# Patient Record
Sex: Male | Born: 1945 | Race: Black or African American | Hispanic: No | Marital: Married | Smoking: Former smoker
Health system: Southern US, Community
[De-identification: ages and names within clinical notes are randomized; demographics above are authoritative.]

## PROBLEM LIST (undated history)

## (undated) DIAGNOSIS — N179 Acute kidney failure, unspecified: Secondary | ICD-10-CM

## (undated) DIAGNOSIS — E559 Vitamin D deficiency, unspecified: Secondary | ICD-10-CM

## (undated) DIAGNOSIS — I10 Essential (primary) hypertension: Secondary | ICD-10-CM

## (undated) DIAGNOSIS — I50810 Right heart failure, unspecified: Secondary | ICD-10-CM

## (undated) HISTORY — PX: PROSTATE SURGERY: SHX751

## (undated) HISTORY — PX: SPINE SURGERY: SHX786

---

## 2014-10-16 ENCOUNTER — Emergency Department (HOSPITAL_COMMUNITY): Payer: Medicare Other

## 2014-10-16 ENCOUNTER — Encounter (HOSPITAL_COMMUNITY): Payer: Self-pay | Admitting: *Deleted

## 2014-10-16 ENCOUNTER — Inpatient Hospital Stay (HOSPITAL_COMMUNITY)
Admission: EM | Admit: 2014-10-16 | Discharge: 2014-10-23 | DRG: 175 | Disposition: A | Discharge status: Home / Self Care | Payer: Medicare Other | Attending: Internal Medicine | Admitting: Internal Medicine

## 2014-10-16 DIAGNOSIS — J029 Acute pharyngitis, unspecified: Secondary | ICD-10-CM | POA: Diagnosis present

## 2014-10-16 DIAGNOSIS — R0600 Dyspnea, unspecified: Secondary | ICD-10-CM

## 2014-10-16 DIAGNOSIS — Z87891 Personal history of nicotine dependence: Secondary | ICD-10-CM

## 2014-10-16 DIAGNOSIS — N179 Acute kidney failure, unspecified: Secondary | ICD-10-CM | POA: Diagnosis present

## 2014-10-16 DIAGNOSIS — J9601 Acute respiratory failure with hypoxia: Secondary | ICD-10-CM | POA: Diagnosis present

## 2014-10-16 DIAGNOSIS — Y95 Nosocomial condition: Secondary | ICD-10-CM | POA: Diagnosis not present

## 2014-10-16 DIAGNOSIS — I82431 Acute embolism and thrombosis of right popliteal vein: Secondary | ICD-10-CM | POA: Diagnosis present

## 2014-10-16 DIAGNOSIS — I82401 Acute embolism and thrombosis of unspecified deep veins of right lower extremity: Secondary | ICD-10-CM | POA: Diagnosis present

## 2014-10-16 DIAGNOSIS — Z9079 Acquired absence of other genital organ(s): Secondary | ICD-10-CM | POA: Diagnosis present

## 2014-10-16 DIAGNOSIS — E876 Hypokalemia: Secondary | ICD-10-CM | POA: Diagnosis present

## 2014-10-16 DIAGNOSIS — I2601 Septic pulmonary embolism with acute cor pulmonale: Secondary | ICD-10-CM | POA: Diagnosis present

## 2014-10-16 DIAGNOSIS — E871 Hypo-osmolality and hyponatremia: Secondary | ICD-10-CM | POA: Diagnosis present

## 2014-10-16 DIAGNOSIS — I10 Essential (primary) hypertension: Secondary | ICD-10-CM | POA: Diagnosis present

## 2014-10-16 DIAGNOSIS — I129 Hypertensive chronic kidney disease with stage 1 through stage 4 chronic kidney disease, or unspecified chronic kidney disease: Secondary | ICD-10-CM | POA: Diagnosis present

## 2014-10-16 DIAGNOSIS — J181 Lobar pneumonia, unspecified organism: Secondary | ICD-10-CM | POA: Diagnosis not present

## 2014-10-16 DIAGNOSIS — I248 Other forms of acute ischemic heart disease: Secondary | ICD-10-CM | POA: Diagnosis present

## 2014-10-16 DIAGNOSIS — E860 Dehydration: Secondary | ICD-10-CM | POA: Diagnosis present

## 2014-10-16 DIAGNOSIS — I2699 Other pulmonary embolism without acute cor pulmonale: Principal | ICD-10-CM | POA: Diagnosis present

## 2014-10-16 DIAGNOSIS — R000 Tachycardia, unspecified: Secondary | ICD-10-CM | POA: Insufficient documentation

## 2014-10-16 DIAGNOSIS — J069 Acute upper respiratory infection, unspecified: Secondary | ICD-10-CM | POA: Diagnosis present

## 2014-10-16 DIAGNOSIS — D62 Acute posthemorrhagic anemia: Secondary | ICD-10-CM | POA: Diagnosis present

## 2014-10-16 DIAGNOSIS — N189 Chronic kidney disease, unspecified: Secondary | ICD-10-CM | POA: Diagnosis present

## 2014-10-16 DIAGNOSIS — A419 Sepsis, unspecified organism: Secondary | ICD-10-CM | POA: Diagnosis not present

## 2014-10-16 DIAGNOSIS — R778 Other specified abnormalities of plasma proteins: Secondary | ICD-10-CM

## 2014-10-16 DIAGNOSIS — E559 Vitamin D deficiency, unspecified: Secondary | ICD-10-CM | POA: Insufficient documentation

## 2014-10-16 DIAGNOSIS — J189 Pneumonia, unspecified organism: Secondary | ICD-10-CM | POA: Clinically undetermined

## 2014-10-16 DIAGNOSIS — R079 Chest pain, unspecified: Secondary | ICD-10-CM

## 2014-10-16 DIAGNOSIS — I2782 Chronic pulmonary embolism: Secondary | ICD-10-CM | POA: Diagnosis present

## 2014-10-16 DIAGNOSIS — R7989 Other specified abnormal findings of blood chemistry: Secondary | ICD-10-CM | POA: Diagnosis present

## 2014-10-16 HISTORY — DX: Vitamin D deficiency, unspecified: E55.9

## 2014-10-16 HISTORY — DX: Essential (primary) hypertension: I10

## 2014-10-16 LAB — TROPONIN I
TROPONIN I: 0.16 ng/mL — AB (ref <0.031)
Troponin I: 0.16 ng/mL — ABNORMAL HIGH (ref <0.031)

## 2014-10-16 LAB — D-DIMER, QUANTITATIVE: D-Dimer, Quant: >20 ug/mL-FEU — ABNORMAL HIGH (ref 0.00–0.48)

## 2014-10-16 LAB — BASIC METABOLIC PANEL
Anion gap: 12 (ref 5–15)
BUN: 16 mg/dL (ref 6–23)
CALCIUM: 10.1 mg/dL (ref 8.4–10.5)
CO2: 27 mmol/L (ref 19–32)
CREATININE: 1.68 mg/dL — AB (ref 0.50–1.35)
Chloride: 98 mmol/L (ref 96–112)
GFR calc non Af Amer: 40 mL/min — ABNORMAL LOW (ref >90)
GFR, EST AFRICAN AMERICAN: 47 mL/min — AB (ref >90)
Glucose, Bld: 122 mg/dL — ABNORMAL HIGH (ref 70–99)
Potassium: 3.5 mmol/L (ref 3.5–5.1)
Sodium: 137 mmol/L (ref 135–145)

## 2014-10-16 LAB — CBC WITH DIFFERENTIAL/PLATELET
BASOS ABS: 0 10*3/uL (ref 0.0–0.1)
Basophils Relative: 0 % (ref 0–1)
EOS ABS: 0.2 10*3/uL (ref 0.0–0.7)
EOS PCT: 1 % (ref 0–5)
HCT: 47.1 % (ref 39.0–52.0)
Hemoglobin: 16.4 g/dL (ref 13.0–17.0)
Lymphocytes Relative: 15 % (ref 12–46)
Lymphs Abs: 2.3 10*3/uL (ref 0.7–4.0)
MCH: 30.6 pg (ref 26.0–34.0)
MCHC: 34.8 g/dL (ref 30.0–36.0)
MCV: 87.9 fL (ref 78.0–100.0)
Monocytes Absolute: 1.3 10*3/uL — ABNORMAL HIGH (ref 0.1–1.0)
Monocytes Relative: 9 % (ref 3–12)
Neutro Abs: 11 10*3/uL — ABNORMAL HIGH (ref 1.7–7.7)
Neutrophils Relative %: 75 % (ref 43–77)
Platelets: 273 10*3/uL (ref 150–400)
RBC: 5.36 MIL/uL (ref 4.22–5.81)
RDW: 14.5 % (ref 11.5–15.5)
WBC: 14.8 10*3/uL — ABNORMAL HIGH (ref 4.0–10.5)

## 2014-10-16 LAB — I-STAT TROPONIN, ED: TROPONIN I, POC: 0.14 ng/mL — AB (ref 0.00–0.08)

## 2014-10-16 LAB — BRAIN NATRIURETIC PEPTIDE: B Natriuretic Peptide: 57.9 pg/mL (ref 0.0–100.0)

## 2014-10-16 MED ORDER — IPRATROPIUM-ALBUTEROL 0.5-2.5 (3) MG/3ML IN SOLN
3.0000 mL | RESPIRATORY_TRACT | Status: DC
  Administered 2014-10-16 – 2014-10-17 (×5): 3 mL via RESPIRATORY_TRACT
  Filled 2014-10-16 (×5): qty 3

## 2014-10-16 MED ORDER — HEPARIN (PORCINE) IN NACL 100-0.45 UNIT/ML-% IJ SOLN
800.0000 [IU]/h | INTRAMUSCULAR | Status: DC
  Administered 2014-10-16: 800 [IU]/h via INTRAVENOUS
  Filled 2014-10-16: qty 250

## 2014-10-16 MED ORDER — HEPARIN (PORCINE) IN NACL 100-0.45 UNIT/ML-% IJ SOLN
1150.0000 [IU]/h | INTRAMUSCULAR | Status: DC
  Administered 2014-10-16: 1150 [IU]/h via INTRAVENOUS

## 2014-10-16 MED ORDER — ASPIRIN 325 MG PO TABS
325.0000 mg | ORAL_TABLET | Freq: Once | ORAL | Status: AC
  Administered 2014-10-16: 325 mg via ORAL
  Filled 2014-10-16: qty 1

## 2014-10-16 MED ORDER — ASPIRIN 81 MG PO CHEW: 81.0000 mg | CHEWABLE_TABLET | Freq: Every day | ORAL | Status: DC

## 2014-10-16 MED ORDER — SODIUM CHLORIDE 0.9 % IV BOLUS (SEPSIS)
1000.0000 mL | Freq: Once | INTRAVENOUS | Status: AC
  Administered 2014-10-16: 1000 mL via INTRAVENOUS

## 2014-10-16 MED ORDER — IOHEXOL 350 MG/ML SOLN
100.0000 mL | Freq: Once | INTRAVENOUS | Status: AC | PRN
  Administered 2014-10-16: 100 mL via INTRAVENOUS

## 2014-10-16 MED ORDER — HEPARIN BOLUS VIA INFUSION
4000.0000 [IU] | INTRAVENOUS | Status: AC
  Administered 2014-10-16: 4000 [IU] via INTRAVENOUS

## 2014-10-16 NOTE — Progress Notes (Addendum)
ANTICOAGULATION CONSULT NOTE - Initial Consult  Pharmacy Consult for heparin Indication: PE/ chest pain/ACS  No Known Allergies  Patient Measurements:   Heparin Dosing Weight: 67kg  Vital Signs: Temp: 99.7 F (37.6 C) (03/01 1716) Temp Source: Oral (03/01 1716) BP: 131/82 mmHg (03/01 1716) Pulse Rate: 103 (03/01 1716)  Labs:  Recent Labs  10/16/14 1424 10/16/14 1427 10/16/14 1558  HGB  --   --  16.4  HCT  --   --  47.1  PLT  --   --  273  CREATININE  --  1.68*  --   TROPONINI 0.16*  --   --     CrCl cannot be calculated (Unknown ideal weight.).   Medical History: Past Medical History  Diagnosis Date  . Hypertension   . Vitamin D deficiency    Assessment: [age] YOM presents with chest pain and shortness of breath,  Cardiac enzymes mildly elevated.  Chest pain is atypical as it was reproducible was cough and palpation.  EKG w/o ischemic changes.  D-dime > 20.  Cardiology has decided to start heparin gtt while ACS is ruled out.    Today, 10/16/2014  CBC: WNL  Renal - SCR is elevated at 1.68 (no documented h/o CKD)  D-dimer >20  Goal of Therapy:  Heparin level 0.3-0.7 units/ml Monitor platelets by anticoagulation protocol: Yes   Plan:   Heparin 4000 units x 1 bolus then 800 units/hr  Check heparin level 6h after heparin started  Daily heparin level and CBC  Monitor for bleeding  Juliette Alcideustin Zeigler, PharmD, BCPS.   Pager: [phone]  10/16/2014,5:41 PM  Addendum:  CT angiogram performed and reveals submassive PE with evidence of right heart strain  Plan:  Increase heparin gtt to 1150 units/hr based on CTA findings  Check heparin level 6h after rate change  Juliette Alcideustin Zeigler, PharmD, BCPS.   Pager: [phone] 10/16/2014 7:55 PM

## 2014-10-16 NOTE — ED Provider Notes (Signed)
CSN: [id number]     Arrival date & time 10/16/14  1250 History   First MD Initiated Contact with Patient 10/16/14 1341     Chief Complaint  Patient presents with  . Shortness of Breath  . Cough     (Consider location/radiation/quality/duration/timing/severity/associated sxs/prior Treatment) HPI Bryan Parker is a 69 y.o. male with a history of hypertension comes in for evaluation of cough, shortness of breath. Patient states for the past 3 days he has had increased cough, sore throat, myalgias, shortness of breath and a burning in his chest when he coughs. He has tried Alka-Seltzer cold and flu, cough drops which were initially effective but have since lost their potency. Rates his discomfort as 5/10. He denies fevers at home, nausea or vomiting, abdominal pain, diarrhea or constipation, numbness or weakness, syncope, rash Family history of cardiac disease unknown. Former smoker half pack a day for past 40 yrs, quit 6 years ago  Past Medical History  Diagnosis Date  . Hypertension    Past Surgical History  Procedure Laterality Date  . Spine surgery    . Prostate surgery     No family history on file. History  Substance Use Topics  . Smoking status: Former Games developer  . Smokeless tobacco: Not on file  . Alcohol Use: No    Review of Systems A 10 point review of systems was completed and was negative except for pertinent positives and negatives as mentioned in the history of present illness     Allergies  Review of patient's allergies indicates no known allergies.  Home Medications   Prior to Admission medications   Medication Sig Start Date End Date Taking? Authorizing Provider  DiphenhydrAMINE HCl (ALKA-SELTZER PLUS ALLERGY PO) Take 2 each by mouth daily as needed (cold symptoms). Dissolve 2 tablets in water   Yes Historical Provider, MD  hydrochlorothiazide (HYDRODIURIL) 12.5 MG tablet Take 12.5 mg by mouth daily.   Yes Historical Provider, MD  naproxen sodium (ANAPROX) 220  MG tablet Take 440 mg by mouth at bedtime as needed (for wrist pain).   Yes Historical Provider, MD  polyethylene glycol (MIRALAX / GLYCOLAX) packet Take 17 g by mouth daily.   Yes Historical Provider, MD  Vitamin D, Ergocalciferol, (DRISDOL) 50000 UNITS CAPS capsule Take 50,000 Units by mouth every 7 (seven) days.   Yes Historical Provider, MD   BP 144/91 mmHg  Pulse 105  Temp(Src) 98.6 F (37 C) (Oral)  Resp 27  SpO2 100% Physical Exam  Constitutional: He is oriented to person, place, and time. He appears well-developed and well-nourished.  HENT:  Head: Normocephalic and atraumatic.  Mouth/Throat: Oropharynx is clear and moist.  Eyes: Conjunctivae are normal. Pupils are equal, round, and reactive to light. Right eye exhibits no discharge. Left eye exhibits no discharge. No scleral icterus.  Neck: Neck supple.  Cardiovascular: Normal rate, regular rhythm and normal heart sounds.   Pulmonary/Chest: Effort normal and breath sounds normal. No respiratory distress. He has no wheezes. He has no rales.  Abdominal: Soft. There is no tenderness.  Musculoskeletal: He exhibits no edema or tenderness.  Neurological: He is alert and oriented to person, place, and time.  Cranial Nerves II-XII grossly intact  Skin: Skin is warm and dry. No rash noted.  Psychiatric: He has a normal mood and affect.  Nursing note and vitals reviewed.   ED Course  Procedures (including critical care time) Labs Review Labs Reviewed  BASIC METABOLIC PANEL - Abnormal; Notable for the following:  Glucose, Bld 122 (*)    Creatinine, Ser 1.68 (*)    GFR calc non Af Amer 40 (*)    GFR calc Af Amer 47 (*)    All other components within normal limits  TROPONIN I - Abnormal; Notable for the following:    Troponin I 0.16 (*)    All other components within normal limits  CBC WITH DIFFERENTIAL/PLATELET - Abnormal; Notable for the following:    WBC 14.8 (*)    Neutro Abs 11.0 (*)    Monocytes Absolute 1.3 (*)    All  other components within normal limits  I-STAT TROPOININ, ED - Abnormal; Notable for the following:    Troponin i, poc 0.14 (*)    All other components within normal limits  BRAIN NATRIURETIC PEPTIDE  D-DIMER, QUANTITATIVE    Imaging Review Dg Chest 2 View  10/16/2014   CLINICAL DATA:  Cough and difficulty breathing for 2 days  EXAM: CHEST  2 VIEW  COMPARISON:  None.  FINDINGS: There is no edema or consolidation. The heart size and pulmonary vascularity are normal. No adenopathy. There is postoperative change in the lower cervical spine.  IMPRESSION: No edema or consolidation.   Electronically Signed   By: Bretta BangWilliam  Woodruff III M.D.   On: 10/16/2014 14:57     EKG Interpretation   Date/Time:  Tuesday October 16 2014 14:01:57 EST Ventricular Rate:  97 PR Interval:  192 QRS Duration: 72 QT Interval:  345 QTC Calculation: 438 R Axis:   84 Text Interpretation:  Sinus rhythm Right atrial enlargement Anteroseptal  infarct, age indeterminate No prior for comparison Confirmed by DOCHERTY   MD, MEGAN [phone]([id number]) on 10/16/2014 2:04:36 PM     Meds given in ED:  Medications  ipratropium-albuterol (DUONEB) 0.5-2.5 (3) MG/3ML nebulizer solution 3 mL (3 mLs Nebulization Given 10/16/14 1448)  aspirin tablet 325 mg (325 mg Oral Given 10/16/14 1556)    New Prescriptions   No medications on file   Filed Vitals:   10/16/14 1312 10/16/14 1446 10/16/14 1630  BP: 141/94 132/87 144/91  Pulse: 119 100 105  Temp: 98.5 F (36.9 C) 98.6 F (37 C) 98.6 F (37 C)  TempSrc: Oral Oral Oral  Resp: 18 26 27   SpO2: 97% 90% 100%    MDM  Vitals stable - mild tachycardia and tachypnea, afebrile Pt resting comfortably in ED. Patient denies any chest pain at this time. PE--benign lung exam otherwise noncontributory Labwork--initial i-STAT troponin 0.14, regular troponin 0.16, EKG without any ischemic changes. Imaging-chest x-ray shows no edema or consolidation.  Discussed patient presentation and ED course with  attending, Dr. Micheline Mazeocherty. Decision made to consult cardiology for further evaluation of elevated troponin. Consult cardiology and they will consult on patient for elevated troponins.  Care signed out to [facility]issa Sciacca, PA-C. Plan is if the d-dimer positive then CTA for PE rule out. If PE positive, admission to medicine floor where cardiology will consult. If negative, admission to cardiology for serial troponins.    Final diagnoses:  None        Sharlene MottsBenjamin W Bryceson Grape, PA-C 10/16/14 1721  Toy CookeyMegan Docherty, MD 10/17/14 [phone]

## 2014-10-16 NOTE — ED Notes (Signed)
Pt not in ROOM at present time; in triage.

## 2014-10-16 NOTE — ED Notes (Signed)
Pt to CT at this time.

## 2014-10-16 NOTE — H&P (Signed)
[facility] History and Physical  Bryan Parker ZOX:[id number] DOB: November 11, 1945 DOA: 10/16/2014  Referring physician: EDP PCP: Loman Brooklyn, MD   Chief Complaint: chest pain  HPI: Bryan Parker is a 69 y.o. male  Her past medical history of hypertension  And previous tobacco use presents to the ER with the above complaints. Patient reports cough congestion runny nose and shortness of breath for the last 3-4 days.  He also noticed severe pain and hit his chest when he coughs.  No fevers or chills no dizziness no lower extremity edema.  In the ER he was noted to have mildly elevated troponin,  Significantly elevated d-dimer and CTA positive for acute PE   Review of Systems:  Constitutional:  No weight loss, night sweats, Fevers, chills, fatigue.  HEENT:  No headaches, Difficulty swallowing,Tooth/dental problems,Sore throat,  No sneezing, itching, ear ache, nasal congestion, post nasal drip,  Cardio-vascular:  No chest pain, Orthopnea, PND, swelling in lower extremities, anasarca, dizziness, palpitations  GI:  No heartburn, indigestion, abdominal pain, nausea, vomiting, diarrhea, change in bowel habits, loss of appetite  Resp:  No shortness of breath with exertion or at rest. No excess mucus, no productive cough, No non-productive cough, No coughing up of blood.No change in color of mucus.No wheezing.No chest wall deformity  Skin:  no rash or lesions.  GU:  no dysuria, change in color of urine, no urgency or frequency. No flank pain.  Musculoskeletal:  No joint pain or swelling. No decreased range of motion. No back pain.  Psych:  No change in mood or affect. No depression or anxiety. No memory loss.   Past Medical History  Diagnosis Date  . Hypertension   . Vitamin D deficiency    Past Surgical History  Procedure Laterality Date  . Spine surgery    . Prostate surgery     Social History:  reports that he has quit smoking. He does not have any smokeless tobacco history  on file. He reports that he does not drink alcohol or use illicit drugs.  No Known Allergies  Family History  Problem Relation Age of Onset  . Deep vein thrombosis Sister   . Pulmonary embolism Sister   . Heart attack Brother 50  . Heart disease Brother     Prior to Admission medications   Medication Sig Start Date End Date Taking? Authorizing Provider  DiphenhydrAMINE HCl (ALKA-SELTZER PLUS ALLERGY PO) Take 2 each by mouth daily as needed (cold symptoms). Dissolve 2 tablets in water   Yes Historical Provider, MD  hydrochlorothiazide (HYDRODIURIL) 12.5 MG tablet Take 12.5 mg by mouth daily.   Yes Historical Provider, MD  naproxen sodium (ANAPROX) 220 MG tablet Take 440 mg by mouth at bedtime as needed (for wrist pain).   Yes Historical Provider, MD  polyethylene glycol (MIRALAX / GLYCOLAX) packet Take 17 g by mouth daily.   Yes Historical Provider, MD  Vitamin D, Ergocalciferol, (DRISDOL) 50000 UNITS CAPS capsule Take 50,000 Units by mouth every 7 (seven) days.   Yes Historical Provider, MD   Physical Exam: Filed Vitals:   10/16/14 1750 10/16/14 1848 10/16/14 1900 10/16/14 1930  BP:  127/83 139/81 151/82  Pulse:  95 96 100  Temp:  99.1 F (37.3 C)    TempSrc:  Oral    Resp:  Height:      Weight:      SpO2: 94% 99% 99% 99%    Wt Readings from Last 3 Encounters:  10/16/14  66.679 kg (147 lb)    General:  Appears calm and comfortable,  No distress Eyes: PERRL, normal lids, irises & conjunctiva ENT: grossly normal hearing, lips & tongue Neck: no LAD, masses or thyromegaly Cardiovascular: RRR, no m/r/g. No LE edema. Telemetry: SR, no arrhythmias  Respiratory:  Scattered Rales at the bases Abdomen: soft, ntnd Skin: no rash or induration seen on limited exam Musculoskeletal: grossly normal tone BUE/BLE Psychiatric: grossly normal mood and affect, speech fluent and appropriate Neurologic: grossly non-focal.          Labs on Admission:  Basic Metabolic  Panel:  Recent Labs Lab 10/16/14 1427  NA 137  K 3.5  CL 98  CO2 27  GLUCOSE 122*  BUN 16  CREATININE 1.68*  CALCIUM 10.1   Liver Function Tests: No results for input(s): AST, ALT, ALKPHOS, BILITOT, PROT, ALBUMIN in the last 168 hours. No results for input(s): LIPASE, AMYLASE in the last 168 hours. No results for input(s): AMMONIA in the last 168 hours. CBC:  Recent Labs Lab 10/16/14 1558  WBC 14.8*  NEUTROABS 11.0*  HGB 16.4  HCT 47.1  MCV 87.9  PLT 273   Cardiac Enzymes:  Recent Labs Lab 10/16/14 1424 10/16/14 1825  TROPONINI 0.16* 0.16*    BNP (last 3 results)  Recent Labs  10/16/14 1423  BNP 57.9    ProBNP (last 3 results) No results for input(s): PROBNP in the last 8760 hours.  CBG: No results for input(s): GLUCAP in the last 168 hours.  Radiological Exams on Admission: Dg Chest 2 View  10/16/2014   CLINICAL DATA:  Cough and difficulty breathing for 2 days  EXAM: CHEST  2 VIEW  COMPARISON:  None.  FINDINGS: There is no edema or consolidation. The heart size and pulmonary vascularity are normal. No adenopathy. There is postoperative change in the lower cervical spine.  IMPRESSION: No edema or consolidation.   Electronically Signed   By: Bretta Bang III M.D.   On: 10/16/2014 14:57   Ct Angio Chest Pe W/cm &/or Wo Cm  10/16/2014   CLINICAL DATA:  Two day history of chest pain and difficulty breathing  EXAM: CT ANGIOGRAPHY CHEST WITH CONTRAST  TECHNIQUE: Multidetector CT imaging of the chest was performed using the standard protocol during bolus administration of intravenous contrast. Multiplanar CT image reconstructions and MIPs were obtained to evaluate the vascular anatomy.  CONTRAST:  OMNIPAQUE IOHEXOL 350 MG/ML SOLN  COMPARISON:  Chest radiograph October 16, 2014  FINDINGS: There is pulmonary embolism arising from the distal right main pulmonary artery with extension into proximal upper and lower lobe pulmonary arteries. On the left, there is  pulmonary embolus arising at the origins of the upper and lower lobe pulmonary arteries on the left. There is right heart strain with a right ventricle to left ventricle diameter ratio of 1.4, normal less than 0.9. There is no thoracic aortic aneurysm or dissection.  There is underlying centrilobular emphysema. There is mild atelectasis in each lung base. There is no edema or consolidation. There is mild lower lobe bronchiectatic change bilaterally.  There is no appreciable thoracic adenopathy. The pericardium is not thickened.  In the visualized upper abdomen, no lesions are identified.  There are no blastic or lytic bone lesions. Thyroid appears unremarkable.  Review of the MIP images confirms the above findings.  IMPRESSION: Positive for acute PE with CT evidence of right heart strain (RV/LV Ratio = 1.4) consistent with at least submassive (intermediate risk) PE. The presence of  right heart strain has been associated with an increased risk of morbidity and mortality.  There is a degree of underlying centrilobular emphysema. Mild bibasilar atelectasis. No edema or consolidation. No adenopathy.  Critical Value/emergent results were called by telephone at the time of interpretation on 10/16/2014 at 6:47 pm to [facility] CenterMARISSA SCIACCA, PA , who verbally acknowledged these results.   Electronically Signed   By: Bretta BangWilliam  Woodruff III M.D.   On: 10/16/2014 18:47    EKG: Independently reviewed. NSR, possible old anteroseptal infarct  Assessment/Plan Active Problems:   Acute PE (pulmonary embolism) -with Ct evidence of R heart strain, hemodynamically stable at this time-no acute indication for TPA - IV heparin,  Can be transitioned to NOACs  In 24-48   Hours if stable -check 2d ECHO  To evaluate for right  ventricle, check lower extremity venous duplex - check hypercoagulable panel - up-to-date on age appropriate malignancy screening,  Normal colonoscopy a few years ago and history of prostatectomy    Elevated  troponin - Suspect demand ischemia from acute PE - cycle troponin, check 2-D echo for wall motion    HTN (hypertension) - Stable hold HCTZ  Code Status: Full Code DVT Prophylaxis: on IV heparin Family Communication: none at bedside Disposition Plan: stepdown  Time spent: 60min  [facility] CenterJOSEPH,Naria Abbey [facility] Pager [phone]

## 2014-10-16 NOTE — ED Notes (Signed)
Tori attempt IV unsuccessful once. [facility] Will attempt IV and blood draw with pt return.

## 2014-10-16 NOTE — Progress Notes (Signed)
EDCM spoke to patient at bedside.  Patient [place]  System updated.

## 2014-10-16 NOTE — Consult Note (Signed)
Admit date: 10/16/2014 Referring Physician  Dr. Littie Deeds Primary Cardiologist  none Reason for Consultation  Chest pain with elevated trop  HPI: This is a 69yo AAM with a history of HTN and family history of premature CAD (brother died of MI in his 48's) and remote tobacco use who presented to the ER with complaints of left sided CP and SOB.  He says that he has had subjective chills, cough and SOB for the past 3 days with sore throat, myalgias and burning in his chest when he coughs.  He also has a tightness in his left breast that is worse when lying on his left side and with rubbing his chest.  He denies and N/V/LE edema/weakness or dizziness.  He has a remote history of tobacco use smoking 1/2ppd for 40 years and quit 6 years ago.  In ER he was noted to have a mildly elevated Trop and Cardiology is now asked to consult.  His WBC is elevated with normal chest xray and EKG is nonischemic.     PMH:   Past Medical History  Diagnosis Date  . Hypertension   . Vitamin D deficiency      PSH:   Past Surgical History  Procedure Laterality Date  . Spine surgery    . Prostate surgery      Allergies:  Review of patient's allergies indicates no known allergies. Prior to Admit Meds:   (Not in a hospital admission) Fam HX:    Family History  Problem Relation Age of Onset  . Deep vein thrombosis Sister   . Pulmonary embolism Sister   . Heart attack Brother 50  . Heart disease Brother    Social HX:    History   Social History  . Marital Status: Married    Spouse Name: N/A  . Number of Children: N/A  . Years of Education: N/A   Occupational History  . Not on file.   Social History Main Topics  . Smoking status: Former Games developer  . Smokeless tobacco: Not on file  . Alcohol Use: No  . Drug Use: No  . Sexual Activity: Not on file   Other Topics Concern  . Not on file   Social History Narrative  . No narrative on file     ROS:  All 11 ROS were addressed and are negative except  what is stated in the HPI  Physical Exam: Blood pressure 131/82, pulse 103, temperature 99.7 F (37.6 C), temperature source Oral, resp. rate 20, SpO2 96 %.    General: Well developed, well nourished, in no acute distress Head: Eyes PERRLA, No xanthomas.   Normal cephalic and atramatic  Lungs:   Clear bilaterally to auscultation and percussion. Heart:   HRRR S1 S2 Pulses are 2+ & equal.            No carotid bruit. No JVD.  No abdominal bruits. No femoral bruits. Abdomen: Bowel sounds are positive, abdomen soft and non-tender without masses Extremities:   No clubbing, cyanosis or edema.  DP +1 Neuro: Alert and oriented X 3. Psych:  Good affect, responds appropriately    Labs:   Lab Results  Component Value Date   WBC 14.8* 10/16/2014   HGB 16.4 10/16/2014   HCT 47.1 10/16/2014   MCV 87.9 10/16/2014   PLT 273 10/16/2014    Recent Labs Lab 10/16/14 1427  NA 137  K 3.5  CL 98  CO2 27  BUN 16  CREATININE 1.68*  CALCIUM 10.1  GLUCOSE 122*   No results found for: PTT No results found for: INR, PROTIME Lab Results  Component Value Date   TROPONINI 0.16* 10/16/2014    No results found for: CHOL No results found for: HDL No results found for: LDLCALC No results found for: TRIG No results found for: CHOLHDL No results found for: LDLDIRECT    Radiology:  Dg Chest 2 View  10/16/2014   CLINICAL DATA:  Cough and difficulty breathing for 2 days  EXAM: CHEST  2 VIEW  COMPARISON:  None.  FINDINGS: There is no edema or consolidation. The heart size and pulmonary vascularity are normal. No adenopathy. There is postoperative change in the lower cervical spine.  IMPRESSION: No edema or consolidation.   Electronically Signed   By: Bretta BangWilliam  Woodruff III M.D.   On: 10/16/2014 14:57    EKG:  NSR with septal infarct age undetermined and no acute ST changes  ASSESSMENT:  1.  Atypical chest pain that is worse with cough and lying on his left side and reproducible some with palpation  over left breast.  EKG is nonischemic. This does not appear to be an acute coronary syndrome and sounds musculoskeletal.   2.  Elevated troponin in setting of acute URI with elevated WBC.  Troponin is minimally elevated and most consistent with demand ischemia from URI.  This is not consistent with acute coronary syndrome.  He does have CRF including remote history of smoking, family history of premature CAD and HTN.  Start IV Heparin gtt until rule out complete.  ASA 81mg  daily.  Would continue to cycle cardiac enzymes and if trend remains flat consider outpt stress test once URI resolves.  Will get 2D echo to assess for structural heart disease and EF.   3.  URI with chills, sore throat, cough and elevated WBC - per hospitalist 4.  HTN - controlled 5.  Acute renal insufficiency ? Secondary to dehydration.   6.  Mild sinus tachycardia due to URI and low grade fever   Quintella ReichertURNER,Elmer Boutelle R, MD  10/16/2014  5:21 PM

## 2014-10-16 NOTE — ED Notes (Signed)
MD currently at bedside.

## 2014-10-16 NOTE — ED Notes (Addendum)
Pt currently resting in bed at this time. Denies any further needs

## 2014-10-16 NOTE — ED Provider Notes (Signed)
Discussed case with Ward ChattersBen Carpenter, PA-C. Transfer of care Ward ChattersBen Carpenter, PA-C at change in shift.  Bryan Parker is a 69 y/o M with PMHx of hypertension presenting to the ED with cough, lethargy, shortness of breath and burning of the chest for the past 2 days.  Plan: D-dimer results-if positive CT angiogram is negative cardiology consult. Regardless patient to be admitted with cardiology consult and medicine to admit.    Results for orders placed or performed during the hospital encounter of 10/16/14  Basic metabolic panel  Result Value Ref Range   Sodium 137 135 - 145 mmol/L   Potassium 3.5 3.5 - 5.1 mmol/L   Chloride 98 96 - 112 mmol/L   CO2 27 19 - 32 mmol/L   Glucose, Bld 122 (H) 70 - 99 mg/dL   BUN 16 6 - 23 mg/dL   Creatinine, Ser 8.111.68 (H) 0.50 - 1.35 mg/dL   Calcium 91.410.1 8.4 - 78.210.5 mg/dL   GFR calc non Af Amer 40 (L) >90 mL/min   GFR calc Af Amer 47 (L) >90 mL/min   Anion gap 12 5 - 15  Brain natriuretic peptide  Result Value Ref Range   B Natriuretic Peptide 57.9 0.0 - 100.0 pg/mL  Troponin I  Result Value Ref Range   Troponin I 0.16 (H) <0.031 ng/mL  CBC with Differential  Result Value Ref Range   WBC 14.8 (H) 4.0 - 10.5 K/uL   RBC 5.36 4.22 - 5.81 MIL/uL   Hemoglobin 16.4 13.0 - 17.0 g/dL   HCT 95.647.1 21.339.0 - 08.652.0 %   MCV 87.9 78.0 - 100.0 fL   MCH 30.6 26.0 - 34.0 pg   MCHC 34.8 30.0 - 36.0 g/dL   RDW 57.814.5 46.911.5 - 62.915.5 %   Platelets 273 150 - 400 K/uL   Neutrophils Relative % 75 43 - 77 %   Neutro Abs 11.0 (H) 1.7 - 7.7 K/uL   Lymphocytes Relative 15 12 - 46 %   Lymphs Abs 2.3 0.7 - 4.0 K/uL   Monocytes Relative 9 3 - 12 %   Monocytes Absolute 1.3 (H) 0.1 - 1.0 K/uL   Eosinophils Relative 1 0 - 5 %   Eosinophils Absolute 0.2 0.0 - 0.7 K/uL   Basophils Relative 0 0 - 1 %   Basophils Absolute 0.0 0.0 - 0.1 K/uL  I-stat troponin, ED  Result Value Ref Range   Troponin i, poc 0.14 (HH) 0.00 - 0.08 ng/mL   Comment NOTIFIED PHYSICIAN    Comment 3           Dg  Chest 2 View  10/16/2014   CLINICAL DATA:  Cough and difficulty breathing for 2 days  EXAM: CHEST  2 VIEW  COMPARISON:  None.  FINDINGS: There is no edema or consolidation. The heart size and pulmonary vascularity are normal. No adenopathy. There is postoperative change in the lower cervical spine.  IMPRESSION: No edema or consolidation.   Electronically Signed   By: Bretta BangWilliam  Woodruff III M.D.   On: 10/16/2014 14:57   Ct Angio Chest Pe W/cm &/or Wo Cm  10/16/2014   CLINICAL DATA:  Two day history of chest pain and difficulty breathing  EXAM: CT ANGIOGRAPHY CHEST WITH CONTRAST  TECHNIQUE: Multidetector CT imaging of the chest was performed using the standard protocol during bolus administration of intravenous contrast. Multiplanar CT image reconstructions and MIPs were obtained to evaluate the vascular anatomy.  CONTRAST:  100mL OMNIPAQUE IOHEXOL 350 MG/ML SOLN  COMPARISON:  Chest radiograph October 16, 2014  FINDINGS: There is pulmonary embolism arising from the distal right main pulmonary artery with extension into proximal upper and lower lobe pulmonary arteries. On the left, there is pulmonary embolus arising at the origins of the upper and lower lobe pulmonary arteries on the left. There is right heart strain with a right ventricle to left ventricle diameter ratio of 1.4, normal less than 0.9. There is no thoracic aortic aneurysm or dissection.  There is underlying centrilobular emphysema. There is mild atelectasis in each lung base. There is no edema or consolidation. There is mild lower lobe bronchiectatic change bilaterally.  There is no appreciable thoracic adenopathy. The pericardium is not thickened.  In the visualized upper abdomen, no lesions are identified.  There are no blastic or lytic bone lesions. Thyroid appears unremarkable.  Review of the MIP images confirms the above findings.  IMPRESSION: Positive for acute PE with CT evidence of right heart strain (RV/LV Ratio = 1.4) consistent with at least  submassive (intermediate risk) PE. The presence of right heart strain has been associated with an increased risk of morbidity and mortality.  There is a degree of underlying centrilobular emphysema. Mild bibasilar atelectasis. No edema or consolidation. No adenopathy.  Critical Value/emergent results were called by telephone at the time of interpretation on 10/16/2014 at 6:47 pm to [facility], [facility] , who verbally acknowledged these results.   Electronically Signed   By: Bretta Bang III M.D.   On: 10/16/2014 18:47     EKG nonischemic. Elevated troponin of 0.16. BNP negative elevation. D-dimer elevated at greater than 20.0. CBC noted mildly elevated white blood cell count of 14.8. Hemoglobin 16.4, hematocrit 47.1. BMP noted elevated creatinine of 1.68. Chest x-ray no edema or consolidation noted. CT angiogram of chest is positive for acute PE with CT evidence of right heart strain.  7:00 PM This provider spoke Dr. Mitchel Honour, [facility]. Discussed labs, imaging, ED course in great detail. Patient to be admitted to stepdown for pulmonary embolism with right heart strain.  Medications  ipratropium-albuterol (DUONEB) 0.5-2.5 (3) MG/3ML nebulizer solution 3 mL (3 mLs Nebulization Given 10/16/14 1448)  aspirin tablet 325 mg (325 mg Oral Given 10/16/14 1556)    Filed Vitals:   10/16/14 1312 10/16/14 1446 10/16/14 1630  BP: 141/94 132/87 144/91  Pulse: 119 100 105  Temp: 98.5 F (36.9 C) 98.6 F (37 C) 98.6 F (37 C)  TempSrc: Oral Oral Oral  Resp: SpO2: 97% 90% 100%   Patient was seen by cardiology in ED setting, Dr. Mayford Knife. As per cardiologist, recommended patient to be admitted to hospitalists. Patient started on heparin drip while in the ED setting. CT angiogram of chest positive for acute PE with right heart strain. Patient to be admitted to the hospital, to the stepdown unit, under the care of [facility]. Discussed plan for admission with patient who understood and  agreed to plan of care. Patient stable for transfer.   Raymon Mutton, PA-C 10/16/14 1908  Mirian Mo, MD 10/19/14 [phone]

## 2014-10-16 NOTE — ED Notes (Signed)
Pharmacy called to change heparin dose 11.5 ml/hr. Dose change at this time.

## 2014-10-16 NOTE — ED Notes (Signed)
Pt states he has had cough, lethargy, shortness of breath, burning in his chest for the past 2 days. Pt denies n/v.

## 2014-10-17 DIAGNOSIS — I2699 Other pulmonary embolism without acute cor pulmonale: Secondary | ICD-10-CM

## 2014-10-17 DIAGNOSIS — R7989 Other specified abnormal findings of blood chemistry: Secondary | ICD-10-CM

## 2014-10-17 DIAGNOSIS — R079 Chest pain, unspecified: Secondary | ICD-10-CM

## 2014-10-17 DIAGNOSIS — N289 Disorder of kidney and ureter, unspecified: Secondary | ICD-10-CM

## 2014-10-17 LAB — CBC
HCT: 42.9 % (ref 39.0–52.0)
HEMATOCRIT: 43.2 % (ref 39.0–52.0)
HEMOGLOBIN: 15.3 g/dL (ref 13.0–17.0)
Hemoglobin: 15.5 g/dL (ref 13.0–17.0)
MCH: 30.8 pg (ref 26.0–34.0)
MCH: 31 pg (ref 26.0–34.0)
MCHC: 35.7 g/dL (ref 30.0–36.0)
MCHC: 35.9 g/dL (ref 30.0–36.0)
MCV: 86.3 fL (ref 78.0–100.0)
MCV: 86.4 fL (ref 78.0–100.0)
PLATELETS: 235 10*3/uL (ref 150–400)
Platelets: 229 10*3/uL (ref 150–400)
RBC: 4.97 MIL/uL (ref 4.22–5.81)
RBC: 5 MIL/uL (ref 4.22–5.81)
RDW: 14.5 % (ref 11.5–15.5)
RDW: 14.5 % (ref 11.5–15.5)
WBC: 13.6 10*3/uL — ABNORMAL HIGH (ref 4.0–10.5)
WBC: 15.3 10*3/uL — ABNORMAL HIGH (ref 4.0–10.5)

## 2014-10-17 LAB — HEPARIN LEVEL (UNFRACTIONATED)
HEPARIN UNFRACTIONATED: 0.71 [IU]/mL — AB (ref 0.30–0.70)
Heparin Unfractionated: 0.78 IU/mL — ABNORMAL HIGH (ref 0.30–0.70)
Heparin Unfractionated: 0.59 IU/mL (ref 0.30–0.70)

## 2014-10-17 LAB — BASIC METABOLIC PANEL
ANION GAP: 13 (ref 5–15)
BUN: 16 mg/dL (ref 6–23)
CHLORIDE: 100 mmol/L (ref 96–112)
CO2: 21 mmol/L (ref 19–32)
CREATININE: 1.87 mg/dL — AB (ref 0.50–1.35)
Calcium: 9 mg/dL (ref 8.4–10.5)
GFR calc Af Amer: 41 mL/min — ABNORMAL LOW (ref >90)
GFR calc non Af Amer: 36 mL/min — ABNORMAL LOW (ref >90)
Glucose, Bld: 140 mg/dL — ABNORMAL HIGH (ref 70–99)
Potassium: 3.3 mmol/L — ABNORMAL LOW (ref 3.5–5.1)
SODIUM: 134 mmol/L — AB (ref 135–145)

## 2014-10-17 LAB — PROTIME-INR
INR: 1.15 (ref 0.00–1.49)
Prothrombin Time: 14.9 seconds (ref 11.6–15.2)

## 2014-10-17 LAB — ANTITHROMBIN III: AntiThromb III Func: 91 % (ref 75–120)

## 2014-10-17 LAB — TROPONIN I: Troponin I: 0.09 ng/mL — ABNORMAL HIGH (ref <0.031)

## 2014-10-17 LAB — MRSA PCR SCREENING: MRSA BY PCR: NEGATIVE

## 2014-10-17 MED ORDER — MORPHINE SULFATE 2 MG/ML IJ SOLN
2.0000 mg | INTRAMUSCULAR | Status: AC
  Administered 2014-10-17: 2 mg via INTRAVENOUS

## 2014-10-17 MED ORDER — ACETAMINOPHEN 650 MG RE SUPP: 650.0000 mg | Freq: Four times a day (QID) | RECTAL | Status: DC | PRN

## 2014-10-17 MED ORDER — SODIUM CHLORIDE 0.9 % IJ SOLN
3.0000 mL | Freq: Two times a day (BID) | INTRAMUSCULAR | Status: DC
  Administered 2014-10-17 – 2014-10-23 (×7): 3 mL via INTRAVENOUS

## 2014-10-17 MED ORDER — MORPHINE SULFATE 2 MG/ML IJ SOLN
1.0000 mg | INTRAMUSCULAR | Status: DC | PRN
  Administered 2014-10-17 – 2014-10-18 (×7): 1 mg via INTRAVENOUS
  Filled 2014-10-17 (×7): qty 1

## 2014-10-17 MED ORDER — MORPHINE SULFATE 2 MG/ML IJ SOLN
INTRAMUSCULAR | Status: AC
  Administered 2014-10-17: 2 mg via INTRAVENOUS
  Filled 2014-10-17: qty 1

## 2014-10-17 MED ORDER — ACETAMINOPHEN 325 MG PO TABS
650.0000 mg | ORAL_TABLET | Freq: Four times a day (QID) | ORAL | Status: DC | PRN
  Administered 2014-10-17: 650 mg via ORAL
  Filled 2014-10-17: qty 2

## 2014-10-17 MED ORDER — SODIUM CHLORIDE 0.9 % IV SOLN
INTRAVENOUS | Status: AC
  Administered 2014-10-17: 17:00:00 via INTRAVENOUS

## 2014-10-17 MED ORDER — ONDANSETRON HCL 4 MG PO TABS: 4.0000 mg | ORAL_TABLET | Freq: Four times a day (QID) | ORAL | Status: DC | PRN

## 2014-10-17 MED ORDER — HYDROCHLOROTHIAZIDE 25 MG PO TABS
12.5000 mg | ORAL_TABLET | Freq: Every day | ORAL | Status: DC
  Administered 2014-10-17 – 2014-10-18 (×2): 12.5 mg via ORAL
  Filled 2014-10-17 (×2): qty 0.5
  Filled 2014-10-17: qty 1

## 2014-10-17 MED ORDER — MORPHINE SULFATE 2 MG/ML IJ SOLN: 1.0000 mg | INTRAMUSCULAR | Status: DC | PRN

## 2014-10-17 MED ORDER — HEPARIN (PORCINE) IN NACL 100-0.45 UNIT/ML-% IJ SOLN
1050.0000 [IU]/h | INTRAMUSCULAR | Status: DC
  Filled 2014-10-17 (×2): qty 250

## 2014-10-17 MED ORDER — ONDANSETRON HCL 4 MG/2ML IJ SOLN
4.0000 mg | Freq: Four times a day (QID) | INTRAMUSCULAR | Status: DC | PRN
  Administered 2014-10-19: 4 mg via INTRAVENOUS
  Filled 2014-10-17: qty 2

## 2014-10-17 NOTE — ED Notes (Signed)
Heparin rate change to 10.715ml/hr or 1050U/hr

## 2014-10-17 NOTE — Progress Notes (Signed)
Echocardiogram 2D Echocardiogram has been performed.  Bryan Parker, Bryan Parker 10/17/2014, 9:46 AM

## 2014-10-17 NOTE — Progress Notes (Signed)
Patient ID: Bryan Parker, male   DOB: [birth year], 69 y.o.   MRN: [id number]  [facility] PROGRESS NOTE  [name] HYQ:[id number] DOB: 12-May-1946 DOA: 10/16/2014 PCP: Loman Brooklyn, MD   Brief narrative:    69 y.o. Male with HTN, tobacco use, presented to the ED with cough, congestion, runny nose, dyspnea which progressed from dyspnea with exertion to dyspnea at rest, associated with chest tightness. In ED, CT chest positive for PE. [facility] asked to admit to SDU for further evaluation.   Assessment/Plan:    Active Problems:   Acute respiratory failure with hypoxia - oxygen saturation in ED 90 % on 2 L  - secondary to acute PE - pt started on Heparin, appreciate pharmacy assistance with dosing  - will need more discussion on [facility] choice once pt more medically stable and able to participate    Elevated troponin - likely secondary from demand ischemia in the setting of acute PE - appreciate cardiology assistance - Heparin as noted above    HTN (hypertension) - reasonable inpatient control  - continue HCTZ   Acute renal failure, hyponatremia  - pre renal - provide IVF and repeat BMP in AM   Hypokalemia - supplement and repeat BMP in AM   Fever - low grade and could be from PE itself - will ask for UA as pt also with leukocytosis of unclear etiology  - repeat CBC in AM  DVT prophylaxis: pt on Heparin drip for acute PE  Code Status: Full.  Family Communication:  plan of care discussed with the patient and wife at bedside  Disposition Plan: Home when stable.   IV access:  Peripheral IV  Procedures and diagnostic studies:    Dg Chest 2 View  10/16/2014  No edema or consolidation.     Ct Angio Chest Pe W/cm &/or Wo Cm   10/16/2014 Positive for acute PE with CT evidence of right heart strain (RV/LV Ratio = 1.4) consistent with at least submassive (intermediate risk) PE. The presence of right heart strain has been associated with an increased risk of morbidity and mortality.   There is a degree of underlying centrilobular emphysema. Mild bibasilar atelectasis. No edema or consolidation. No adenopathy.   Medical Consultants:  Cardiology     Other Consultants:  None  IAnti-Infectives:   None   Bryan Presto, MD  [facility] Pager [phone]  If 7PM-7AM, please contact night-coverage [email or web address] Password [email or web address] 10/17/2014, 1:21 PM   LOS: 1 day   HPI/Subjective: No events overnight.   Objective: Filed Vitals:   10/17/14 1146 10/17/14 1200 10/17/14 1230 10/17/14 1313  BP: 136/82 140/105  139/92  Pulse: 96 122 99 98  Temp: 98.1 F (36.7 C)     TempSrc: Oral     Resp: 33 28  24  Height:     (1.727 m)  Weight:    62.5 kg (137 lb 12.6 oz)  SpO2: 99% 99% 96% 96%    Intake/Output Summary (Last 24 hours) at 10/17/14 1321 Last data filed at 10/16/14 2014  Gross per 24 hour  Intake      0 ml  Output    350 ml  Net   -350 ml    Exam:   General:  Pt is alert, follows commands appropriately, not in acute distress  Cardiovascular: Regular rate and rhythm, S1/S2, no murmurs, no rubs, no gallops  Respiratory: Clear to auscultation bilaterally, no wheezing, diminished breath sounds at bases   Abdomen: Soft, non tender, non  distended, bowel sounds present, no guarding  Extremities: pulses DP and PT palpable bilaterally  Neuro: Grossly nonfocal  Data Reviewed: Basic Metabolic Panel:  Recent Labs Lab 10/16/14 1427 10/17/14 0750  NA 137 134*  K 3.5 3.3*  CL 98 100  CO2 27 21  GLUCOSE 122* 140*  BUN 16 16  CREATININE 1.68* 1.87*  CALCIUM 10.1 9.0   CBC:  Recent Labs Lab 10/16/14 1558 10/17/14 0418 10/17/14 0750  WBC 14.8* 13.6* 15.3*  NEUTROABS 11.0*  --   --   HGB 16.4 15.3 15.5  HCT 47.1 42.9 43.2  MCV 87.9 86.3 86.4  PLT 273 229 235   Cardiac Enzymes:  Recent Labs Lab 10/16/14 1424 10/16/14 1825 10/17/14 0418  TROPONINI 0.16* 0.16* 0.09*   Scheduled Meds: . hydrochlorothiazide  12.5 mg Oral Daily  . sodium  chloride  3 mL Intravenous Q12H   Continuous Infusions: . heparin

## 2014-10-17 NOTE — Progress Notes (Signed)
VASCULAR LAB PRELIMINARY  PRELIMINARY  PRELIMINARY  PRELIMINARY  Bilateral lower extremity venous duplex completed.    Preliminary report:  Right - Positive for an acute mobile deep vein thrombus of the entire popliteal vein. There is no evidence of a suprficial thrombus or Baker's cyst. Left:  No evidence of DVT, superficial thrombosis, or Baker's cyst.  Silver Parkey, RVS 10/17/2014, 8:48 AM

## 2014-10-17 NOTE — Progress Notes (Signed)
ANTICOAGULATION CONSULT NOTE - F/u Consult  Pharmacy Consult for heparin Indication: PE/ chest pain/ACS/PE  No Known Allergies  Patient Measurements: Height: 5\' 8"  (172.7 cm) Weight: 137 lb 12.6 oz (62.5 kg) IBW/kg (Calculated) : 68.4 Heparin Dosing Weight: 67kg  Vital Signs: Temp: 98.1 F (36.7 C) (03/02 1146) Temp Source: Oral (03/02 1146) BP: 139/92 mmHg (03/02 1313) Pulse Rate: 98 (03/02 1313)  Labs:  Recent Labs  10/16/14 1424 10/16/14 1427  10/16/14 1558 10/16/14 1825 10/17/14 0418 10/17/14 0750 10/17/14 1330  HGB  --   --   < > 16.4  --  15.3 15.5  --   HCT  --   --   --  47.1  --  42.9 43.2  --   PLT  --   --   --  273  --  229 235  --   LABPROT  --   --   --   --   --  14.9  --   --   INR  --   --   --   --   --  1.15  --   --   HEPARINUNFRC  --   --   --   --   --  0.78* 0.71* 0.59  CREATININE  --  1.68*  --   --   --   --  1.87*  --   TROPONINI 0.16*  --   --   --  0.16* 0.09*  --   --   < > = values in this interval not displayed.  Estimated Creatinine Clearance: 33.9 mL/min (by C-G formula based on Cr of 1.87).   Medical History: Past Medical History  Diagnosis Date  . Hypertension   . Vitamin D deficiency    Assessment: [age] YOM presents with chest pain and shortness of breath,  Cardiac enzymes mildly elevated.  Chest pain is atypical as it was reproducible was cough and palpation.  EKG w/o ischemic changes.  D-dimer > 20.  Cardiology has decided to start heparin gtt while ACS is ruled out.     10/16/2014  CBC: WNL  Renal - SCR is elevated at 1.68 (no documented h/o CKD)  D-dimer >20  CT angiogram performed and reveals submassive PE w/ evidence of right heart strain (heparin drip adjusted)  Today, 10/17/2014  1st HL=0.78 on 1150 units/hr, reduced to 1050 units/hr  2nd HL 0.71 but drawn >3 hr early  3rd HL 0.59; therapeutic, drawn full 6 hrs after previous lab  CBC wnl  No reported bleeding or infusion problems per nursing  Goal of  Therapy:  Heparin level 0.3-0.7 units/ml Monitor platelets by anticoagulation protocol: Yes   Plan:   Continue Heparin drip at 1050 units/hr  Daily heparin level and CBC  Monitor for bleeding  Bernadene Personrew Cleven Jansma, PharmD Pager: [phone] 10/17/2014, 4:27 PM

## 2014-10-17 NOTE — ED Notes (Signed)
Bed switched to a regular hospital bed.

## 2014-10-17 NOTE — Progress Notes (Signed)
PHARMACY BRIEF NOTE - HEPARIN  Followed up on Heparin level ordered for 11:30 AM and found it had actually been drawn at 7:50 AM and was 0.71 units/mL.  This is just above therapeutic range, but was drawn less than three hours after rate reduction to 1050 units/hr and therefore likely does not reflect steady-state concentration.  Have re-ordered another Heparin level for 1:00 PM.    Called ED to discuss with RN but they were occupied caring for another patient.  Left message with nursing secretary to relay to RN.  Plan: 1. Recheck heparin level at 1:00 PM 2  Full note to follow thereafter.  Elie Goodyandy Jesiel Garate, PharmD, BCPS Pager: [phone] 10/17/2014  12:02 PM

## 2014-10-17 NOTE — ED Notes (Signed)
2D Echo tech in room.

## 2014-10-17 NOTE — Progress Notes (Signed)
SUBJECTIVE:  Pain in hands and pain in chest with coughing.  Just had LE u/s and has acute popliteal , mobile thrombus.  OBJECTIVE:   Vitals:   Filed Vitals:   10/17/14 0525 10/17/14 0530 10/17/14 0600 10/17/14 0630  BP: 132/82 136/81 134/92 117/83  Pulse: 97 98 108   Temp: 98.9 F (37.2 C)     TempSrc: Oral     Resp: 20     Height:      Weight:      SpO2: 96% 97% 97%    I&O's:   Intake/Output Summary (Last 24 hours) at 10/17/14 1005 Last data filed at 10/16/14 2014  Gross per 24 hour  Intake      0 ml  Output    350 ml  Net   -350 ml   TELEMETRY: Reviewed telemetry pt in NSR:     PHYSICAL EXAM General: Well developed, well nourished, in no acute distress Head:   Normal cephalic and atramatic  Lungs:   Clear bilaterally to auscultation. Heart:   HRRR S1 S2  No JVD.   Abdomen: abdomen soft and non-tender Msk:  Back normal,  Normal strength and tone for age.    Neuro: Alert and oriented. Psych:  Normal affect, responds appropriately Skin: No rash   LABS: Basic Metabolic Panel:  Recent Labs  16/05/9602/01/16 1427 10/17/14 0750  NA 137 134*  K 3.5 3.3*  CL 98 100  CO2 27 21  GLUCOSE 122* 140*  BUN 16 16  CREATININE 1.68* 1.87*  CALCIUM 10.1 9.0   Liver Function Tests: No results for input(s): AST, ALT, ALKPHOS, BILITOT, PROT, ALBUMIN in the last 72 hours. No results for input(s): LIPASE, AMYLASE in the last 72 hours. CBC:  Recent Labs  10/16/14 1558 10/17/14 0418 10/17/14 0750  WBC 14.8* 13.6* 15.3*  NEUTROABS 11.0*  --   --   HGB 16.4 15.3 15.5  HCT 47.1 42.9 43.2  MCV 87.9 86.3 86.4  PLT 273 229 235   Cardiac Enzymes:  Recent Labs  10/16/14 1424 10/16/14 1825 10/17/14 0418  TROPONINI 0.16* 0.16* 0.09*   BNP: Invalid input(s): POCBNP D-Dimer:  Recent Labs  10/16/14 1427  DDIMER >20.00*   Hemoglobin A1C: No results for input(s): HGBA1C in the last 72 hours. Fasting Lipid Panel: No results for input(s): CHOL, HDL, LDLCALC, TRIG,  CHOLHDL, LDLDIRECT in the last 72 hours. Thyroid Function Tests: No results for input(s): TSH, T4TOTAL, T3FREE, THYROIDAB in the last 72 hours.  Invalid input(s): FREET3 Anemia Panel: No results for input(s): VITAMINB12, FOLATE, FERRITIN, TIBC, IRON, RETICCTPCT in the last 72 hours. Coag Panel:   Lab Results  Component Value Date   INR 1.15 10/17/2014    RADIOLOGY: Dg Chest 2 View  10/16/2014   CLINICAL DATA:  Cough and difficulty breathing for 2 days  EXAM: CHEST  2 VIEW  COMPARISON:  None.  FINDINGS: There is no edema or consolidation. The heart size and pulmonary vascularity are normal. No adenopathy. There is postoperative change in the lower cervical spine.  IMPRESSION: No edema or consolidation.   Electronically Signed   By: Bretta BangWilliam  Woodruff III M.D.   On: 10/16/2014 14:57   Ct Angio Chest Pe W/cm &/or Wo Cm  10/16/2014   CLINICAL DATA:  Two day history of chest pain and difficulty breathing  EXAM: CT ANGIOGRAPHY CHEST WITH CONTRAST  TECHNIQUE: Multidetector CT imaging of the chest was performed using the standard protocol during bolus administration of intravenous contrast. Multiplanar CT  image reconstructions and MIPs were obtained to evaluate the vascular anatomy.  CONTRAST:  OMNIPAQUE IOHEXOL 350 MG/ML SOLN  COMPARISON:  Chest radiograph October 16, 2014  FINDINGS: There is pulmonary embolism arising from the distal right main pulmonary artery with extension into proximal upper and lower lobe pulmonary arteries. On the left, there is pulmonary embolus arising at the origins of the upper and lower lobe pulmonary arteries on the left. There is right heart strain with a right ventricle to left ventricle diameter ratio of 1.4, normal less than 0.9. There is no thoracic aortic aneurysm or dissection.  There is underlying centrilobular emphysema. There is mild atelectasis in each lung base. There is no edema or consolidation. There is mild lower lobe bronchiectatic change bilaterally.  There  is no appreciable thoracic adenopathy. The pericardium is not thickened.  In the visualized upper abdomen, no lesions are identified.  There are no blastic or lytic bone lesions. Thyroid appears unremarkable.  Review of the MIP images confirms the above findings.  IMPRESSION: Positive for acute PE with CT evidence of right heart strain (RV/LV Ratio = 1.4) consistent with at least submassive (intermediate risk) PE. The presence of right heart strain has been associated with an increased risk of morbidity and mortality.  There is a degree of underlying centrilobular emphysema. Mild bibasilar atelectasis. No edema or consolidation. No adenopathy.  Critical Value/emergent results were called by telephone at the time of interpretation on 10/16/2014 at 6:47 pm to [facility] , who verbally acknowledged these results.   Electronically Signed   By: Bretta Bang III M.D.   On: 10/16/2014 18:47      ASSESSMENT: Bryan Parker:    1) DVT/PE: Likely cause of elevated troponin. Will need anticoagulation based on renal function. Would check with pharmacy.  Would not plan any coronary ischemia w/u at this time.  No plans for stress test. Will sign off.  Renal insufficiency: slightly increased Cr today.  F/u with Dr. Armanda Magic if cardiology f/u is needed.  Corky Crafts, MD  10/17/2014  10:05 AM

## 2014-10-17 NOTE — Progress Notes (Signed)
ANTICOAGULATION CONSULT NOTE - F/u Consult  Pharmacy Consult for heparin Indication: PE/ chest pain/ACS/PE  No Known Allergies  Patient Measurements: Height: 5\' 8"  (172.7 cm) Weight: 147 lb (66.679 kg) IBW/kg (Calculated) : 68.4 Heparin Dosing Weight: 67kg  Vital Signs: Temp: 99.1 F (37.3 C) (03/01 1848) Temp Source: Oral (03/01 1848) BP: 109/70 mmHg (03/02 0200) Pulse Rate: 110 (03/02 0200)  Labs:  Recent Labs  10/16/14 1424 10/16/14 1427 10/16/14 1558 10/16/14 1825 10/17/14 0418  HGB  --   --  16.4  --  15.3  HCT  --   --  47.1  --  42.9  PLT  --   --  273  --  229  LABPROT  --   --   --   --  14.9  INR  --   --   --   --  1.15  HEPARINUNFRC  --   --   --   --  0.78*  CREATININE  --  1.68*  --   --   --   TROPONINI 0.16*  --   --  0.16*  --     Estimated Creatinine Clearance: 40.3 mL/min (by C-G formula based on Cr of 1.68).   Medical History: Past Medical History  Diagnosis Date  . Hypertension   . Vitamin D deficiency    Assessment: [age] YOM presents with chest pain and shortness of breath,  Cardiac enzymes mildly elevated.  Chest pain is atypical as it was reproducible was cough and palpation.  EKG w/o ischemic changes.  D-dimer > 20.  Cardiology has decided to start heparin gtt while ACS is ruled out.     10/16/2014  CBC: WNL  Renal - SCR is elevated at 1.68 (no documented h/o CKD)  D-dimer >20  CT angiogram performed and reveals submassive PE w/ evidence of right heart strain (heparin drip adjusted) Today, 10/17/2014  0415 HL=0.78, no problems per RN  Goal of Therapy:  Heparin level 0.3-0.7 units/ml Monitor platelets by anticoagulation protocol: Yes   Plan:   Decrease Heparin drip to 1050 units/hr  Check heparin level 6h after heparin decreased  Daily heparin level and CBC  Monitor for bleeding   Lorenza EvangelistGreen, Collene Massimino R  10/17/2014,5:09 AM

## 2014-10-18 LAB — CBC
HEMATOCRIT: 40.3 % (ref 39.0–52.0)
HEMOGLOBIN: 14.3 g/dL (ref 13.0–17.0)
MCH: 30.6 pg (ref 26.0–34.0)
MCHC: 35.5 g/dL (ref 30.0–36.0)
MCV: 86.3 fL (ref 78.0–100.0)
Platelets: 219 10*3/uL (ref 150–400)
RBC: 4.67 MIL/uL (ref 4.22–5.81)
RDW: 14.3 % (ref 11.5–15.5)
WBC: 14.4 10*3/uL — AB (ref 4.0–10.5)

## 2014-10-18 LAB — CARDIOLIPIN ANTIBODIES, IGG, IGM, IGA
ANTICARDIOLIPIN IGM: 50 [MPL'U]/mL — AB (ref 0–12)
Anticardiolipin IgA: <9 APL U/mL (ref 0–11)
Anticardiolipin IgG: <9 GPL U/mL (ref 0–14)

## 2014-10-18 LAB — URINALYSIS, ROUTINE W REFLEX MICROSCOPIC
BILIRUBIN URINE: NEGATIVE
Glucose, UA: NEGATIVE mg/dL
Ketones, ur: NEGATIVE mg/dL
LEUKOCYTES UA: NEGATIVE
NITRITE: NEGATIVE
PH: 6 (ref 5.0–8.0)
Protein, ur: NEGATIVE mg/dL
SPECIFIC GRAVITY, URINE: 1.011 (ref 1.005–1.030)
Urobilinogen, UA: 0.2 mg/dL (ref 0.0–1.0)

## 2014-10-18 LAB — BETA-2-GLYCOPROTEIN I ABS, IGG/M/A
Beta-2 Glyco I IgG: <9 GPI IgG units (ref 0–20)
Beta-2-Glycoprotein I IgA: <9 GPI IgA units (ref 0–25)

## 2014-10-18 LAB — HOMOCYSTEINE: Homocysteine: 16.9 umol/L — ABNORMAL HIGH (ref 0.0–15.0)

## 2014-10-18 LAB — LUPUS ANTICOAGULANT PANEL
DRVVT: 45 s (ref 0.0–55.1)
PTT Lupus Anticoagulant: 56.7 s — ABNORMAL HIGH (ref 0.0–50.0)

## 2014-10-18 LAB — BASIC METABOLIC PANEL
ANION GAP: 9 (ref 5–15)
BUN: 16 mg/dL (ref 6–23)
CHLORIDE: 99 mmol/L (ref 96–112)
CO2: 22 mmol/L (ref 19–32)
Calcium: 8.2 mg/dL — ABNORMAL LOW (ref 8.4–10.5)
Creatinine, Ser: 1.63 mg/dL — ABNORMAL HIGH (ref 0.50–1.35)
GFR calc non Af Amer: 42 mL/min — ABNORMAL LOW (ref >90)
GFR, EST AFRICAN AMERICAN: 49 mL/min — AB (ref >90)
Glucose, Bld: 121 mg/dL — ABNORMAL HIGH (ref 70–99)
Potassium: 3.5 mmol/L (ref 3.5–5.1)
SODIUM: 130 mmol/L — AB (ref 135–145)

## 2014-10-18 LAB — HEPARIN LEVEL (UNFRACTIONATED)
HEPARIN UNFRACTIONATED: 0.28 [IU]/mL — AB (ref 0.30–0.70)
HEPARIN UNFRACTIONATED: 0.54 [IU]/mL (ref 0.30–0.70)
Heparin Unfractionated: 0.32 IU/mL (ref 0.30–0.70)

## 2014-10-18 LAB — PTT-LA MIX: PTT-LA Mix: 53.2 s — ABNORMAL HIGH (ref 0.0–50.0)

## 2014-10-18 LAB — PROTEIN S ACTIVITY: PROTEIN S ACTIVITY: 84 % (ref 60–145)

## 2014-10-18 LAB — PROTEIN S, TOTAL: Protein S Ag, Total: 164 % — ABNORMAL HIGH (ref 58–150)

## 2014-10-18 LAB — PROTEIN C, TOTAL: PROTEIN C, TOTAL: 85 % (ref 70–140)

## 2014-10-18 LAB — URINE MICROSCOPIC-ADD ON

## 2014-10-18 LAB — PROTEIN C ACTIVITY: Protein C Activity: 128 % (ref 74–151)

## 2014-10-18 LAB — HEXAGONAL PHASE PHOSPHOLIPID: HEXAGONAL PHASE PHOSPHOLIPID: 27.7 s — AB (ref 0.0–8.0)

## 2014-10-18 MED ORDER — HYDROMORPHONE HCL 1 MG/ML IJ SOLN
1.0000 mg | INTRAMUSCULAR | Status: DC | PRN
  Administered 2014-10-18 – 2014-10-20 (×8): 1 mg via INTRAVENOUS
  Filled 2014-10-18 (×8): qty 1

## 2014-10-18 MED ORDER — HEPARIN (PORCINE) IN NACL 100-0.45 UNIT/ML-% IJ SOLN
1200.0000 [IU]/h | INTRAMUSCULAR | Status: AC
  Administered 2014-10-18 (×2): 1100 [IU]/h via INTRAVENOUS
  Filled 2014-10-18 (×3): qty 250

## 2014-10-18 MED ORDER — OXYCODONE-ACETAMINOPHEN 5-325 MG PO TABS
1.0000 | ORAL_TABLET | ORAL | Status: DC | PRN
  Administered 2014-10-19 – 2014-10-20 (×4): 2 via ORAL
  Administered 2014-10-21 (×2): 1 via ORAL
  Administered 2014-10-21 – 2014-10-23 (×5): 2 via ORAL
  Filled 2014-10-18: qty 1
  Filled 2014-10-18 (×4): qty 2
  Filled 2014-10-18: qty 1
  Filled 2014-10-18 (×6): qty 2

## 2014-10-18 MED ORDER — SODIUM CHLORIDE 0.9 % IV SOLN
INTRAVENOUS | Status: AC
  Administered 2014-10-18: 10:00:00 via INTRAVENOUS

## 2014-10-18 MED ORDER — HEPARIN BOLUS VIA INFUSION
1000.0000 [IU] | Freq: Once | INTRAVENOUS | Status: AC
  Administered 2014-10-18: 1000 [IU] via INTRAVENOUS
  Filled 2014-10-18: qty 1000

## 2014-10-18 NOTE — Progress Notes (Signed)
Patient ID: Bryan Parker, male   DOB: [birth year], 69 y.o.   MRN: [id number]  [facility] PROGRESS NOTE  Bryan DestineMarion K Parker OZH:[id number] DOB: [birth year] DOA: 10/16/2014 PCP: Loman BrooklynARD,JOHN P, MD   Brief narrative:    69 y.o. Male with HTN, tobacco use, presented to the ED with cough, congestion, runny nose, dyspnea which progressed from dyspnea with exertion to dyspnea at rest, associated with chest tightness. In ED, CT chest positive for PE. [facility] asked to admit to SDU for further evaluation  Assessment/Plan:    Active Problems:  Acute respiratory failure with hypoxia - oxygen saturation in ED 90 % on 2 L [place] - secondary to acute PE - pt started on Heparin, appreciate pharmacy assistance with dosing  - pt wants to be on Xarelto and will need to monitor renal function for additional 24 hours and maybe able to start Xarelto   Elevated troponin - likely secondary from demand ischemia in the setting of acute PE - appreciate cardiology assistance - Heparin as noted above   HTN (hypertension) - reasonable inpatient control  - continue HCTZ  Acute renal failure, hyponatremia  - pre renal. Cr is trending down  - continue to provide IVF and repeat BMP in AM  Hypokalemia - supplemented and WNL this AM  Fever - low grade and could be from PE itself - UA still pending, WBC is slightly trending down  - repeat CBC in AM  DVT prophylaxis: pt on Heparin drip for acute PE  Code Status: Full.  Family Communication: plan of care discussed with the patient and wife at bedside  Disposition Plan: Transfer to telemetry unit   IV access:  Peripheral IV  Procedures and diagnostic studies:    Dg Chest 2 View  10/16/2014   No edema or consolidation.     Ct Angio Chest Pe W/cm &/or Wo Cm  10/16/2014  Positive for acute PE with CT evidence of right heart strain (RV/LV Ratio = 1.4) consistent with at least submassive (intermediate risk) PE. The presence of right heart strain has been associated  with an increased risk of morbidity and mortality.  There is a degree of underlying centrilobular emphysema. Mild bibasilar atelectasis. No edema or consolidation. No adenopathy.   Medical Consultants:  Pharmacy   Other Consultants:  None   IAnti-Infectives:   None   [name], MD  [facility] Pager [phone]  If 7PM-7AM, please contact night-coverage [email or web address] Password [facility]RH1 10/18/2014, 8:56 AM   LOS: 2 days   HPI/Subjective: No events overnight.   Objective: Filed Vitals:   10/18/14 0500 10/18/14 0600 10/18/14 0700 10/18/14 0800  BP: 133/82 132/76 136/82   Pulse: 81 78 82   Temp:    97.9 F (36.6 C)  TempSrc:    Oral  Resp: 23 23 28    Height:      Weight:      SpO2: 97% 97% 96%     Intake/Output Summary (Last 24 hours) at 10/18/14 0856 Last data filed at 10/18/14 0800  Gross per 24 hour  Intake 588.77 ml  Output   1350 ml  Net -761.23 ml    Exam:   General:  Pt is alert, follows commands appropriately, not in acute distress  Cardiovascular: Regular rate and rhythm, no rubs, no gallops  Respiratory: Clear to auscultation bilaterally, no wheezing, diminished breath sounds at bases   Abdomen: Soft, non tender, non distended, bowel sounds present, no guarding  Extremities: No edema, pulses DP and PT palpable bilaterally  Neuro: Grossly nonfocal  Data Reviewed: Basic Metabolic Panel:  Recent Labs Lab 10/16/14 1427 10/17/14 0750 10/18/14 0315  NA 137 134* 130*  K 3.5 3.3* 3.5  CL 98 100 99  CO2 GLUCOSE 122* 140* 121*  BUN CREATININE 1.68* 1.87* 1.63*  CALCIUM 10.1 9.0 8.2*   CBC:  Recent Labs Lab 10/16/14 1558 10/17/14 0418 10/17/14 0750 10/18/14 0315  WBC 14.8* 13.6* 15.3* 14.4*  NEUTROABS 11.0*  --   --   --   HGB 16.4 15.3 15.5 14.3  HCT 47.1 42.9 43.2 40.3  MCV 87.9 86.3 86.4 86.3  PLT 273 229 235 219   Cardiac Enzymes:  Recent Labs Lab 10/16/14 1424 10/16/14 1825 10/17/14 0418  TROPONINI 0.16*  0.16* 0.09*   Recent Results (from the past 240 hour(s))  MRSA PCR Screening     Status: None   Collection Time: 10/17/14  1:14 PM  Result Value Ref Range Status   MRSA by PCR NEGATIVE NEGATIVE Final    Scheduled Meds: . hydrochlorothiazide  12.5 mg Oral Daily  . sodium chloride  3 mL Intravenous Q12H   Continuous Infusions: . heparin 1,100 Units/hr (10/18/14 0841)

## 2014-10-18 NOTE — Progress Notes (Signed)
ANTICOAGULATION CONSULT NOTE - F/u Consult  Pharmacy Consult for heparin Indication: PE/ chest pain/ACS/PE  No Known Allergies  Patient Measurements: Height: 5\' 8"  (172.7 cm) Weight: 140 lb 10.5 oz (63.8 kg) IBW/kg (Calculated) : 68.4 Heparin Dosing Weight: 67kg  Vital Signs: Temp: 99.1 F (37.3 C) (03/03 1330) Temp Source: Oral (03/03 1330) BP: 138/87 mmHg (03/03 1330) Pulse Rate: 102 (03/03 1330)  Labs:  Recent Labs  10/16/14 1424 10/16/14 1427  10/16/14 1825  10/17/14 0418 10/17/14 0750 10/17/14 1330 10/18/14 0315 10/18/14 1445  HGB  --   --   < >  --   --  15.3 15.5  --  14.3  --   HCT  --   --   < >  --   --  42.9 43.2  --  40.3  --   PLT  --   --   < >  --   --  229 235  --  219  --   LABPROT  --   --   --   --   --  14.9  --   --   --   --   INR  --   --   --   --   --  1.15  --   --   --   --   HEPARINUNFRC  --   --   --   --   < > 0.78* 0.71* 0.59 0.32 0.28*  CREATININE  --  1.68*  --   --   --   --  1.87*  --  1.63*  --   TROPONINI 0.16*  --   --  0.16*  --  0.09*  --   --   --   --   < > = values in this interval not displayed.  Estimated Creatinine Clearance: 39.7 mL/min (by C-G formula based on Cr of 1.63).   Medical History: Past Medical History  Diagnosis Date  . Hypertension   . Vitamin D deficiency    Assessment: [age] YOM presents with chest pain and shortness of breath,  Cardiac enzymes mildly elevated.  Chest pain is atypical as it was reproducible with cough and palpation.  EKG w/o ischemic changes.  D-dimer > 20.  Heparin started for r/o ACS, but CT now reveals submassive PE w/ evidence of R heart strain.  Significant events Baseline CBC, INR wnl No baseline aPTT drawn Baseline SCr elevated, CrCl ~40 ml/min 1st HL=0.78 on 1150 units/hr, reduced to 1050 units/hr 2nd HL 0.71 but drawn >3 hr early 3rd HL 0.59; therapeutic, drawn full 6 hrs after previous lab  Today, 10/18/2014:  AM heparin level therapeutic but with clear downward trend  and nearly out of therapeutic range, rate increased to 1100 Units/hr  1500 level 0.28 (subtherapeutic)  CBC wnl  No reported bleeding or infusion problems per nursing  Goal of Therapy:  Heparin level 0.3-0.7 units/ml Monitor platelets by anticoagulation protocol: Yes   Plan:   Bolus 1000 Units then increase heparin rate to 1200 Units/hr  Check HL in 6 hrs from time of adjustment.   Daily heparin level and CBC  Monitor for bleeding  F/u plans for transition to outpatient anticoagulation  Arley Phenixllen Kia Stavros RPh 10/18/2014, 4:01 PM Pager [phone]

## 2014-10-18 NOTE — Care Management Note (Addendum)
    Page 1 of 1   10/23/2014     2:21:08 PM CARE MANAGEMENT NOTE 10/23/2014  Patient:  Bryan Parker,Bryan Parker   Account Number:  [id number]  Date Initiated:  10/18/2014  Documentation initiated by:  Bryan Parker,Bryan Parker  Subjective/Objective Assessment:   69 y/o m admitted w/Acute PE.     Action/Plan:   From home.   Anticipated DC Date:  10/24/2014   Anticipated DC Plan:  HOME/SELF CARE      DC Planning Services  CM consult      Choice offered to / List presented to:             Status of service:  In process, will continue to follow Medicare Important Message given?  YES (If response is "NO", the following Medicare IM given date fields will be blank) Date Medicare IM given:  10/22/2014 Medicare IM given by:  [facility]HABIR,Bryan Parker Date Additional Medicare IM given:   Additional Medicare IM given by:    Discharge Disposition:    Per UR Regulation:  Reviewed for med. necessity/level of care/duration of stay  If discussed at Long Length of Stay Meetings, dates discussed:   10/23/2014    Comments:  10/23/14 Bryan ClamKathy Christipher Rieger RN BSN NCM [phone] Noted no home 02 needed.No anticipated d/c needs.  10/18/14 Bryan ClamKathy Rhema Boyett RN BSN NCM [phone] No anticipated d/c needs.

## 2014-10-18 NOTE — Progress Notes (Addendum)
ANTICOAGULATION CONSULT NOTE - F/u Consult  Pharmacy Consult for heparin Indication: PE/ chest pain/ACS/PE  No Known Allergies  Patient Measurements: Height: 5\' 8"  (172.7 cm) Weight: 137 lb 12.6 oz (62.5 kg) IBW/kg (Calculated) : 68.4 Heparin Dosing Weight: 67kg  Vital Signs: Temp: 98.7 F (37.1 C) (03/03 0314) Temp Source: Oral (03/03 0314) BP: 136/82 mmHg (03/03 0700) Pulse Rate: 82 (03/03 0700)  Labs:  Recent Labs  10/16/14 1424 10/16/14 1427  10/16/14 1825  10/17/14 0418 10/17/14 0750 10/17/14 1330 10/18/14 0315  HGB  --   --   < >  --   --  15.3 15.5  --  14.3  HCT  --   --   < >  --   --  42.9 43.2  --  40.3  PLT  --   --   < >  --   --  229 235  --  219  LABPROT  --   --   --   --   --  14.9  --   --   --   INR  --   --   --   --   --  1.15  --   --   --   HEPARINUNFRC  --   --   --   --   < > 0.78* 0.71* 0.59 0.32  CREATININE  --  1.68*  --   --   --   --  1.87*  --  1.63*  TROPONINI 0.16*  --   --  0.16*  --  0.09*  --   --   --   < > = values in this interval not displayed.  Estimated Creatinine Clearance: 38.9 mL/min (by C-G formula based on Cr of 1.63).   Medical History: Past Medical History  Diagnosis Date  . Hypertension   . Vitamin D deficiency    Assessment: [age] YOM presents with chest pain and shortness of breath,  Cardiac enzymes mildly elevated.  Chest pain is atypical as it was reproducible with cough and palpation.  EKG w/o ischemic changes.  D-dimer > 20.  Heparin started for r/o ACS, but CT now reveals submassive PE w/ evidence of R heart strain.  Significant events Baseline CBC, INR wnl No baseline aPTT drawn Baseline SCr elevated, CrCl ~40 ml/min 1st HL=0.78 on 1150 units/hr, reduced to 1050 units/hr 2nd HL 0.71 but drawn >3 hr early 3rd HL 0.59; therapeutic, drawn full 6 hrs after previous lab  Today, 10/18/2014:  AM heparin level therapeutic but with clear downward trend and nearly out of therapeutic range  CBC wnl  No  reported bleeding or infusion problems per nursing  Goal of Therapy:  Heparin level 0.3-0.7 units/ml Monitor platelets by anticoagulation protocol: Yes   Plan:   Will proactively increase heparin rate to 1100 units/hr as I am concerned the level will continue to drop out of the therapeutic range and patient with new PE  Check HL in 6 hrs from time of adjustment.  If still within goal, can wait until daily heparin level tomorrow AM  Daily heparin level and CBC  Monitor for bleeding  F/u plans for transition to outpatient anticoagulation  Bernadene Personrew Cyncere Ruhe, PharmD Pager: [phone] 10/18/2014, 8:09 AM

## 2014-10-19 LAB — URINE CULTURE
Colony Count: NO GROWTH
Culture: NO GROWTH

## 2014-10-19 LAB — CBC
HCT: 38 % — ABNORMAL LOW (ref 39.0–52.0)
HEMOGLOBIN: 13.7 g/dL (ref 13.0–17.0)
MCH: 31.4 pg (ref 26.0–34.0)
MCHC: 36.1 g/dL — ABNORMAL HIGH (ref 30.0–36.0)
MCV: 87.2 fL (ref 78.0–100.0)
Platelets: 260 10*3/uL (ref 150–400)
RBC: 4.36 MIL/uL (ref 4.22–5.81)
RDW: 14.5 % (ref 11.5–15.5)
WBC: 15.4 10*3/uL — ABNORMAL HIGH (ref 4.0–10.5)

## 2014-10-19 LAB — BASIC METABOLIC PANEL
Anion gap: 10 (ref 5–15)
BUN: 19 mg/dL (ref 6–23)
CO2: 23 mmol/L (ref 19–32)
Calcium: 8.5 mg/dL (ref 8.4–10.5)
Chloride: 99 mmol/L (ref 96–112)
Creatinine, Ser: 1.6 mg/dL — ABNORMAL HIGH (ref 0.50–1.35)
GFR, EST AFRICAN AMERICAN: 50 mL/min — AB (ref >90)
GFR, EST NON AFRICAN AMERICAN: 43 mL/min — AB (ref >90)
GLUCOSE: 124 mg/dL — AB (ref 70–99)
Potassium: 3.9 mmol/L (ref 3.5–5.1)
Sodium: 132 mmol/L — ABNORMAL LOW (ref 135–145)

## 2014-10-19 LAB — HEPARIN LEVEL (UNFRACTIONATED): Heparin Unfractionated: 0.43 IU/mL (ref 0.30–0.70)

## 2014-10-19 MED ORDER — RIVAROXABAN 15 MG PO TABS
15.0000 mg | ORAL_TABLET | Freq: Two times a day (BID) | ORAL | Status: DC
  Administered 2014-10-19 – 2014-10-23 (×8): 15 mg via ORAL
  Filled 2014-10-19 (×9): qty 1

## 2014-10-19 MED ORDER — POLYETHYLENE GLYCOL 3350 17 G PO PACK
17.0000 g | PACK | Freq: Every day | ORAL | Status: DC | PRN
  Administered 2014-10-19 – 2014-10-22 (×4): 17 g via ORAL
  Filled 2014-10-19 (×4): qty 1

## 2014-10-19 MED ORDER — RIVAROXABAN (XARELTO) EDUCATION KIT FOR DVT/PE PATIENTS
PACK | Freq: Once | Status: AC
  Administered 2014-10-19: 11:00:00
  Filled 2014-10-19: qty 1

## 2014-10-19 MED ORDER — HYDROCHLOROTHIAZIDE 12.5 MG PO CAPS
12.5000 mg | ORAL_CAPSULE | Freq: Every day | ORAL | Status: DC
  Administered 2014-10-19 – 2014-10-23 (×5): 12.5 mg via ORAL
  Filled 2014-10-19 (×5): qty 1

## 2014-10-19 NOTE — Progress Notes (Signed)
Patient ID: Bryan Parker, male   DOB: January 19, 1946, 69 y.o.   MRN: [id number]  [facility] PROGRESS NOTE  Bryan DestineMarion K Parker NWG:[id number] DOB: January 19, 1946 DOA: 10/16/2014 PCP: Loman BrooklynARD,JOHN P, MD   Brief narrative:    69 y.o. Male with HTN, tobacco use, presented to the ED with cough, congestion, runny nose, dyspnea which progressed from dyspnea with exertion to dyspnea at rest, associated with chest tightness. In ED, CT chest positive for PE. [facility] asked to admit to SDU for further evaluation  Assessment/Plan:    Active Problems:  Acute respiratory failure with hypoxia - oxygen saturation in ED 94 - 97 % on 2 L  - secondary to acute PE - pt started on Heparin, appreciate pharmacy assistance with dosing and will place on Xarelto today  - pt wants to be on Xarelto, not coumadin   Elevated troponin - likely secondary from demand ischemia in the setting of acute PE - appreciate cardiology assistance, no further indication for cardiac interventions or work up  [address] [facility]- [facility] as noted above   HTN (hypertension) - reasonable inpatient control  - continue HCTZ  Acute renal failure, hyponatremia  - pre renal. Cr remaining fairly stable at 1.6 - stop IVF and repeat BMP in AM  Hypokalemia - supplemented and WNL this AM  Fever 3/3 - low grade and could be from PE itself - no resolved  - UA with no signs of an infectious etiology  - repeat CBC in AM  DVT prophylaxis: pt on Heparin drip for acute PE and will be transitioned to Xarelto   Code Status: Full.  Family Communication: plan of care discussed with the patient and wife at bedside  Disposition Plan: Home in AM  IV access:  Peripheral IV  Procedures and diagnostic studies:    Dg Chest 2 View 10/16/2014 No edema or consolidation.   Ct Angio Chest Pe W/cm &/or Wo Cm 10/16/2014 Positive for acute PE with CT evidence of right heart strain (RV/LV Ratio = 1.4) consistent with at least submassive (intermediate risk) PE. The  presence of right heart strain has been associated with an increased risk of morbidity and mortality. There is a degree of underlying centrilobular emphysema. Mild bibasilar atelectasis. No edema or consolidation. No adenopathy.   Medical Consultants:  Pharmacy    Other Consultants:  None  IAnti-Infectives:   None   Debbora PrestoMAGICK-Amillion Scobee, MD  [facility] Pager [phone]  If 7PM-7AM, please contact night-coverage [email or web address] Password [email or web address] 10/19/2014, 11:12 AM   LOS: 3 days   HPI/Subjective: No events overnight.   Objective: Filed Vitals:   10/18/14 1030 10/18/14 1330 10/18/14 2107 10/19/14 0547  BP: 163/73 138/87 126/71 115/71  Pulse: 92 102 91 87  Temp: 98 F (36.7 C) 99.1 F (37.3 C) 97.8 F (36.6 C) 98.2 F (36.8 C)  TempSrc: Oral Oral Oral Oral  Resp: 24 20 20 20   Height: 5\' 8"  (1.727 m)     Weight: 63.8 kg (140 lb 10.5 oz)     SpO2: 96% 97% 96% 97%    Intake/Output Summary (Last 24 hours) at 10/19/14 1112 Last data filed at 10/19/14 0700  Gross per 24 hour  Intake    360 ml  Output   1175 ml  Net   -815 ml    Exam:   General:  Pt is alert, follows commands appropriately, not in acute distress  Cardiovascular: Regular rate and rhythm, S1/S2, no murmurs, no rubs, no gallops  Respiratory: Clear to auscultation bilaterally, no wheezing,  diminished breath sounds at bases   Abdomen: Soft, non tender, non distended, bowel sounds present, no guarding  Extremities: No edema, pulses DP and PT palpable bilaterally  Neuro: Grossly nonfocal  Data Reviewed: Basic Metabolic Panel:  Recent Labs Lab 10/16/14 1427 10/17/14 0750 10/18/14 0315 10/19/14 0426  NA 137 134* 130* 132*  K 3.5 3.3* 3.5 3.9  CL 98 100 99 99  CO2 GLUCOSE 122* 140* 121* 124*  BUN CREATININE 1.68* 1.87* 1.63* 1.60*  CALCIUM 10.1 9.0 8.2* 8.5   CBC:  Recent Labs Lab 10/16/14 1558 10/17/14 0418 10/17/14 0750 10/18/14 0315 10/19/14 0426  WBC 14.8* 13.6*  15.3* 14.4* 15.4*  NEUTROABS 11.0*  --   --   --   --   HGB 16.4 15.3 15.5 14.3 13.7  HCT 47.1 42.9 43.2 40.3 38.0*  MCV 87.9 86.3 86.4 86.3 87.2  PLT 273 229 235 219 260   Cardiac Enzymes:  Recent Labs Lab 10/16/14 1424 10/16/14 1825 10/17/14 0418  TROPONINI 0.16* 0.16* 0.09*     Recent Results (from the past 240 hour(s))  MRSA PCR Screening     Status: None   Collection Time: 10/17/14  1:14 PM  Result Value Ref Range Status   MRSA by PCR NEGATIVE NEGATIVE Final    Comment:        The GeneXpert MRSA Assay (FDA approved for NASAL specimens only), is one component of a comprehensive MRSA colonization surveillance program. It is not intended to diagnose MRSA infection nor to guide or monitor treatment for MRSA infections.      Scheduled Meds: . hydrochlorothiazide  12.5 mg Oral Daily  . rivaroxaban   Does not apply Once  . Rivaroxaban  15 mg Oral BID WC  . sodium chloride  3 mL Intravenous Q12H   Continuous Infusions: . heparin 1,200 Units/hr (10/18/14 2300)

## 2014-10-19 NOTE — Discharge Instructions (Signed)
Information on my medicine - XARELTO (rivaroxaban)  This medication education was reviewed with me or my healthcare representative as part of my discharge preparation.  The pharmacist that spoke with me during my hospital stay was:  Bryan Parker, Bryan Parker, [facility]  WHY WAS [name] PRESCRIBED FOR YOU? Xarelto was prescribed to treat blood clots that may have been found in the veins of your legs (deep vein thrombosis) or in your lungs (pulmonary embolism) and to reduce the risk of them occurring again.  What do you need to know about Xarelto? The starting dose is one 15 mg tablet taken TWICE daily with food for the FIRST 21 DAYS then on (enter date)  11/09/14  the dose is changed to one 20 mg tablet taken ONCE A DAY with your evening meal.  DO NOT stop taking Xarelto without talking to the health care provider who prescribed the medication.  Refill your prescription for 20 mg tablets before you run out.  After discharge, you should have regular check-up appointments with your healthcare provider that is prescribing your Xarelto.  In the future your dose may need to be changed if your kidney function changes by a significant amount.  What do you do if you miss a dose? If you are taking Xarelto TWICE DAILY and you miss a dose, take it as soon as you remember. You may take two 15 mg tablets (total 30 mg) at the same time then resume your regularly scheduled 15 mg twice daily the next day.  If you are taking Xarelto ONCE DAILY and you miss a dose, take it as soon as you remember on the same day then continue your regularly scheduled once daily regimen the next day. Do not take two doses of Xarelto at the same time.   Important Safety Information Xarelto is a blood thinner medicine that can cause bleeding. You should call your healthcare provider right away if you experience any of the following: ? Bleeding from an injury or your nose that does not stop. ? Unusual colored urine (red or dark  brown) or unusual colored stools (red or black). ? Unusual bruising for unknown reasons. ? A serious fall or if you hit your head (even if there is no bleeding).  Some medicines may interact with Xarelto and might increase your risk of bleeding while on Xarelto. To help avoid this, consult your healthcare provider or pharmacist prior to using any new prescription or non-prescription medications, including herbals, vitamins, non-steroidal anti-inflammatory drugs (NSAIDs) and supplements.  This website has more information on Xarelto: [email or web address].xarelto.[email or web address].

## 2014-10-19 NOTE — Progress Notes (Signed)
ANTICOAGULATION CONSULT NOTE - Follow up  Pharmacy Consult for heparin -> Xarelto Indication: PE/ chest pain/ACS/PE  No Known Allergies  Patient Measurements: Height: _0  (172.7 cm) Weight: 140 lb 10.5 oz (63.8 kg) IBW/kg (Calculated) : 68.4 Heparin Dosing Weight: 67kg  Vital Signs: Temp: 98.2 F (36.8 C) (03/04 0547) Temp Source: Oral (03/04 0547) BP: 115/71 mmHg (03/04 0547) Pulse Rate: 87 (03/04 0547)  Labs:  Recent Labs  10/16/14 1424  10/16/14 1825  10/17/14 0418 10/17/14 0750  10/18/14 0315 10/18/14 1445 10/18/14 2220 10/19/14 0426  HGB  --   < >  --   --  15.3 15.5  --  14.3  --   --  13.7  HCT  --   < >  --   --  42.9 43.2  --  40.3  --   --  38.0*  PLT  --   < >  --   --  229 235  --  219  --   --  260  LABPROT  --   --   --   --  14.9  --   --   --   --   --   --   INR  --   --   --   --  1.15  --   --   --   --   --   --   HEPARINUNFRC  --   --   --   < > 0.78* 0.71*  < > 0.32 0.28* 0.54 0.43  CREATININE  --   < >  --   --   --  1.87*  --  1.63*  --   --  1.60*  TROPONINI 0.16*  --  0.16*  --  0.09*  --   --   --   --   --   --   < > = values in this interval not displayed.  Estimated Creatinine Clearance: 40.4 mL/min (by C-G formula based on Cr of 1.6).   Medical History: Past Medical History  Diagnosis Date  . Hypertension   . Vitamin D deficiency    Assessment: 16 YOM presents with chest pain and shortness of breath,  Cardiac enzymes mildly elevated.  Chest pain is atypical as it was reproducible with cough and palpation.  EKG w/o ischemic changes.  D-dimer > 20.  Heparin started for r/o ACS, but CT now reveals submassive PE w/ evidence of R heart strain.  Significant events Baseline CBC, INR wnl No baseline aPTT drawn Baseline SCr elevated, CrCl ~40 ml/min 1st HL=0.78 on 1150 units/hr, reduced to 1050 units/hr 2nd HL 0.71 but drawn >3 hr early 3rd HL 0.59; therapeutic, drawn full 6 hrs after previous lab 4th HL therapeutic  Today,  10/19/2014:  Ordered to transition to Xarelto.   CBC wnl. No bleeding reported/documented.  SCr elevated but improving. CrCl >30.  Goal of Therapy:  Monitor platelets by anticoagulation protocol: Yes   Plan:   Stop heparin infusion at 1600.  Start Xarelto at 1600. 75m PO BID w/meals x 21 days then 23mPO daily.   Monitor CBC, clinical course.   Counseled on Xarelto therapy. Answered all questions.  Gave him the discharge kit, including discount card. Demonstrated the Starter pack - MD please prescribe this at discharge and [facility]. If discharged on Sat or Sun, we will have to call pharmacies to find one that has a Starter pack in  stock.    He was taking Naproxen at home. It's probably okay to continue but I told him to discuss it with his doctor.   Romeo Rabon, PharmD, pager [phone]. 10/19/2014,10:25 AM.

## 2014-10-19 NOTE — Progress Notes (Signed)
ANTICOAGULATION CONSULT NOTE - Follow Up Consult  Pharmacy Consult for Heparin Indication: chest pain/ACS and pulmonary embolus  No Known Allergies  Patient Measurements: Height: 5\' 8"  (172.7 cm) Weight: 140 lb 10.5 oz (63.8 kg) IBW/kg (Calculated) : 68.4 Heparin Dosing Weight:   Vital Signs: Temp: 97.8 F (36.6 C) (03/03 2107) Temp Source: Oral (03/03 2107) BP: 126/71 mmHg (03/03 2107) Pulse Rate: 91 (03/03 2107)  Labs:  Recent Labs  10/16/14 1424  10/16/14 1825  10/17/14 0418 10/17/14 0750  10/18/14 0315 10/18/14 1445 10/18/14 2220 10/19/14 0426  HGB  --   < >  --   --  15.3 15.5  --  14.3  --   --  13.7  HCT  --   < >  --   --  42.9 43.2  --  40.3  --   --  38.0*  PLT  --   < >  --   --  229 235  --  219  --   --  260  LABPROT  --   --   --   --  14.9  --   --   --   --   --   --   INR  --   --   --   --  1.15  --   --   --   --   --   --   HEPARINUNFRC  --   --   --   < > 0.78* 0.71*  < > 0.32 0.28* 0.54 0.43  CREATININE  --   < >  --   --   --  1.87*  --  1.63*  --   --  1.60*  TROPONINI 0.16*  --  0.16*  --  0.09*  --   --   --   --   --   --   < > = values in this interval not displayed.  Estimated Creatinine Clearance: 40.4 mL/min (by C-G formula based on Cr of 1.6).   Medications:  Infusions:  . heparin 1,200 Units/hr (10/18/14 2300)    Assessment: Patient with heparin at goal for second time.  No issues per RN.   Goal of Therapy:  Heparin level 0.3-0.7 units/ml Monitor platelets by anticoagulation protocol: Yes   Plan:  Continue heparin drip at current rate Recheck level with 3/5 AM labs  Aleene DavidsonGrimsley Jr, Evany Schecter Crowford 10/19/2014,5:23 AM

## 2014-10-20 ENCOUNTER — Inpatient Hospital Stay (HOSPITAL_COMMUNITY): Payer: Medicare Other

## 2014-10-20 LAB — CBC
HCT: 36 % — ABNORMAL LOW (ref 39.0–52.0)
Hemoglobin: 12.6 g/dL — ABNORMAL LOW (ref 13.0–17.0)
MCH: 30.3 pg (ref 26.0–34.0)
MCHC: 35 g/dL (ref 30.0–36.0)
MCV: 86.5 fL (ref 78.0–100.0)
Platelets: 255 10*3/uL (ref 150–400)
RBC: 4.16 MIL/uL — ABNORMAL LOW (ref 4.22–5.81)
RDW: 14.2 % (ref 11.5–15.5)
WBC: 21.4 10*3/uL — ABNORMAL HIGH (ref 4.0–10.5)

## 2014-10-20 LAB — BASIC METABOLIC PANEL
Anion gap: 10 (ref 5–15)
BUN: 21 mg/dL (ref 6–23)
CALCIUM: 8.5 mg/dL (ref 8.4–10.5)
CHLORIDE: 94 mmol/L — AB (ref 96–112)
CO2: 25 mmol/L (ref 19–32)
CREATININE: 1.75 mg/dL — AB (ref 0.50–1.35)
GFR calc Af Amer: 45 mL/min — ABNORMAL LOW (ref >90)
GFR calc non Af Amer: 39 mL/min — ABNORMAL LOW (ref >90)
Glucose, Bld: 123 mg/dL — ABNORMAL HIGH (ref 70–99)
POTASSIUM: 3.6 mmol/L (ref 3.5–5.1)
SODIUM: 129 mmol/L — AB (ref 135–145)

## 2014-10-20 LAB — EXPECTORATED SPUTUM ASSESSMENT W REFEX TO RESP CULTURE

## 2014-10-20 LAB — EXPECTORATED SPUTUM ASSESSMENT W GRAM STAIN, RFLX TO RESP C

## 2014-10-20 LAB — STREP PNEUMONIAE URINARY ANTIGEN: Strep Pneumo Urinary Antigen: NEGATIVE

## 2014-10-20 MED ORDER — CEFTRIAXONE SODIUM IN DEXTROSE 20 MG/ML IV SOLN
1.0000 g | Freq: Every day | INTRAVENOUS | Status: DC
  Administered 2014-10-20: 1 g via INTRAVENOUS
  Filled 2014-10-20: qty 50

## 2014-10-20 MED ORDER — DEXTROSE 5 % IV SOLN
1.0000 g | Freq: Two times a day (BID) | INTRAVENOUS | Status: DC
  Administered 2014-10-20 – 2014-10-22 (×6): 1 g via INTRAVENOUS
  Filled 2014-10-20 (×7): qty 1

## 2014-10-20 MED ORDER — SODIUM CHLORIDE 0.9 % IV SOLN
INTRAVENOUS | Status: DC
  Administered 2014-10-20: 08:00:00 via INTRAVENOUS

## 2014-10-20 MED ORDER — VANCOMYCIN HCL IN DEXTROSE 1-5 GM/200ML-% IV SOLN
1000.0000 mg | INTRAVENOUS | Status: DC
  Administered 2014-10-21 – 2014-10-22 (×2): 1000 mg via INTRAVENOUS
  Filled 2014-10-20 (×2): qty 200

## 2014-10-20 MED ORDER — VITAMIN D (ERGOCALCIFEROL) 1.25 MG (50000 UNIT) PO CAPS
50000.0000 [IU] | ORAL_CAPSULE | ORAL | Status: DC
  Administered 2014-10-20: 50000 [IU] via ORAL
  Filled 2014-10-20: qty 1

## 2014-10-20 MED ORDER — VANCOMYCIN HCL 10 G IV SOLR
1500.0000 mg | Freq: Once | INTRAVENOUS | Status: AC
  Administered 2014-10-20: 1500 mg via INTRAVENOUS
  Filled 2014-10-20: qty 1500

## 2014-10-20 MED ORDER — AZITHROMYCIN 500 MG IV SOLR
500.0000 mg | Freq: Every day | INTRAVENOUS | Status: DC
  Filled 2014-10-20: qty 500

## 2014-10-20 NOTE — Progress Notes (Signed)
Patient ID: Bryan Parker, male   DOB: 1946/06/18, 69 y.o.   MRN: [id number]  [facility] PROGRESS NOTE  Bryan Parker PJA:[id number] DOB: Sep 19, 1945 DOA: 10/16/2014 PCP: Bryan Portela, MD   Brief narrative:    68 y.o. Male with HTN, tobacco use, presented to the ED with cough, congestion, runny nose, dyspnea which progressed from dyspnea with exertion to dyspnea at rest, associated with chest tightness. In ED, CT chest positive for PE. TRH asked to admit to SDU for further evaluation  Major events since admission: 3/1 - Acute PE, started on Heparin 3/4 - Xarelto started, pt did not want Coumadin  3/5 - worse WBC, HR in 100's with RR in mid 20's, persistent hypoxia, CXR with new ? Infiltrate, ABX started   Assessment/Plan:    Active Problems:  Acute respiratory failure with hypoxia - oxygen saturation in ED 94 - 97 % on 2 L [name] - secondary to acute PE initially, now also likely contribution from PNA (HCAP) based on CXR - pt started on Heparin, appreciate pharmacy assistance with dosing, transitioned to Xarelto 3/4 - pt wants to be on Xarelto, not coumadin  - ABX added for PNA 3/5   Sepsis secondary to lobar PNA in the RLL, ? HCAP, unknown pathogen  - criteria met 3/5 with HR 106, RR 24, T 99.37F, WBC 21 K - started Vanc and Maxipime to cover for potential HCAP and will narrow down as clinically indicated  - monitor VS closely, keep on telemetry - CBC in AM - provide IVF   Elevated troponin - likely secondary from demand ischemia in the setting of acute PE - appreciate cardiology assistance, no further indication for cardiac interventions or work up  [facility] as noted above   HTN (hypertension) - reasonable inpatient control  - continue HCTZ  Acute renal failure, hyponatremia  - pre renal. Cr slightly up over the past 24 hours adn NA down, and possible from developing sepsis - place on IVF and reassess in AM  Hypokalemia - supplemented and WNL this AM   DVT  prophylaxis: pt on Heparin drip for acute PE and transitioned to Xarelto 3/4  Code Status: Full.  Family Communication: plan of care discussed with the patient and wife at bedside  Disposition Plan: Home in AM  IV access:  Peripheral IV  Procedures and diagnostic studies:    Dg Chest 2 View 10/16/2014 No edema or consolidation.   Ct Angio Chest Pe W/cm &/or Wo Cm 10/16/2014 Positive for acute PE with CT evidence of right heart strain (RV/LV Ratio = 1.4) consistent with at least submassive (intermediate risk) PE. The presence of right heart strain has been associated with an increased risk of morbidity and mortality. There is a degree of underlying centrilobular emphysema. Mild bibasilar atelectasis. No edema or consolidation. No adenopathy.   Dg Chest 2 View  10/20/2014   New right lower lung infiltrate and small right pleural effusion, most likely due to pulmonary infarct in setting of acute pulmonary embolism.     Medical Consultants:  Pharmacy    Other Consultants:  None  IAnti-Infectives:   None   Bryan Ramsay, MD  [facility] Pager [phone]  If 7PM-7AM, please contact night-coverage [email or web address] Password [email or web address] 10/20/2014, 2:09 PM   LOS: 4 days   HPI/Subjective: No events overnight. More dyspnea and fevers   Objective: Filed Vitals:   10/19/14 2215 10/20/14 0559 10/20/14 0955 10/20/14 1337  BP: 125/78 111/97 136/72 141/96  Pulse: 105 102 105 106  Temp: 99.2 F (37.3 C) 99.5 F (37.5 C)  99.2 F (37.3 C)  TempSrc: Oral Oral  Oral  Resp: _0 Height:      Weight:      SpO2: 95% 94%  96%    Intake/Output Summary (Last 24 hours) at 10/20/14 1409 Last data filed at 10/20/14 0850  Gross per 24 hour  Intake    240 ml  Output    650 ml  Net   -410 ml    Exam:   General:  Pt is alert, follows commands appropriately, not in acute distress  Cardiovascular: Regular rhythm, tachycardic, S1/S2, no murmurs, no rubs, no gallops  Respiratory: Clear to  auscultation bilaterally, no wheezing, rhonchi at bases   Abdomen: Soft, non tender, non distended, bowel sounds present, no guarding  Extremities: pulses DP and PT palpable bilaterally  Neuro: Grossly nonfocal  Data Reviewed: Basic Metabolic Panel:  Recent Labs Lab 10/16/14 1427 10/17/14 0750 10/18/14 0315 10/19/14 0426 10/20/14 0458  NA 137 134* 130* 132* 129*  K 3.5 3.3* 3.5 3.9 3.6  CL 98 100 99 99 94*  CO2 _1 GLUCOSE 122* 140* 121* 124* 123*  BUN _2 CREATININE 1.68* 1.87* 1.63* 1.60* 1.75*  CALCIUM 10.1 9.0 8.2* 8.5 8.5   CBC:  Recent Labs Lab 10/16/14 1558 10/17/14 0418 10/17/14 0750 10/18/14 0315 10/19/14 0426 10/20/14 0458  WBC 14.8* 13.6* 15.3* 14.4* 15.4* 21.4*  NEUTROABS 11.0*  --   --   --   --   --   HGB 16.4 15.3 15.5 14.3 13.7 12.6*  HCT 47.1 42.9 43.2 40.3 38.0* 36.0*  MCV 87.9 86.3 86.4 86.3 87.2 86.5  PLT 273 229 235 219 260 255   Cardiac Enzymes:  Recent Labs Lab 10/16/14 1424 10/16/14 1825 10/17/14 0418  TROPONINI 0.16* 0.16* 0.09*     Recent Results (from the past 240 hour(s))  MRSA PCR Screening     Status: None   Collection Time: 10/17/14  1:14 PM  Result Value Ref Range Status   MRSA by PCR NEGATIVE NEGATIVE Final    Comment:        The GeneXpert MRSA Assay (FDA approved for NASAL specimens only), is one component of a comprehensive MRSA colonization surveillance program. It is not intended to diagnose MRSA infection nor to guide or monitor treatment for MRSA infections.   Culture, Urine     Status: None   Collection Time: 10/18/14  1:10 PM  Result Value Ref Range Status   Specimen Description URINE, CATHETERIZED  Final   Special Requests NONE  Final   Colony Count NO GROWTH Performed at [facility]   Final   Culture NO GROWTH Performed at [facility]   Final   Report Status 10/19/2014 FINAL  Final     Scheduled Meds: . azithromycin  500 mg Intravenous Q1200  .  cefTRIAXone (ROCEPHIN)  IV  1 g Intravenous Q1200  . hydrochlorothiazide  12.5 mg Oral Daily  . Rivaroxaban  15 mg Oral BID WC  . sodium chloride  3 mL Intravenous Q12H  . Vitamin D (Ergocalciferol)  50,000 Units Oral Q7 days   Continuous Infusions: . sodium chloride 75 mL/hr at 10/20/14 0825

## 2014-10-20 NOTE — Progress Notes (Signed)
ANTIBIOTIC CONSULT NOTE - INITIAL  Pharmacy Consult for Vancomycin, Cefepime Indication: pneumonia  No Known Allergies  Patient Measurements: Height: 5\' 8"  (172.7 cm) Weight: 140 lb 10.5 oz (63.8 kg) IBW/kg (Calculated) : 68.4  Vital Signs: Temp: 99.2 F (37.3 C) (03/05 1337) Temp Source: Oral (03/05 1337) BP: 141/96 mmHg (03/05 1337) Pulse Rate: 106 (03/05 1337) Intake/Output from previous day: 03/04 0701 - 03/05 0700 In: 120 [P.O.:120] Out: 1050 [Urine:1050] Intake/Output from this shift: Total I/O In: 240 [P.O.:240] Out: -   Labs:  Recent Labs  10/18/14 0315 10/19/14 0426 10/20/14 0458  WBC 14.4* 15.4* 21.4*  HGB 14.3 13.7 12.6*  PLT 219 260 255  CREATININE 1.63* 1.60* 1.75*   Estimated Creatinine Clearance: 37 mL/min (by C-G formula based on Cr of 1.75). No results for input(s): VANCOTROUGH, VANCOPEAK, VANCORANDOM, GENTTROUGH, GENTPEAK, GENTRANDOM, TOBRATROUGH, TOBRAPEAK, TOBRARND, AMIKACINPEAK, AMIKACINTROU, AMIKACIN in the last 72 hours.   Microbiology: Recent Results (from the past 720 hour(s))  MRSA PCR Screening     Status: None   Collection Time: 10/17/14  1:14 PM  Result Value Ref Range Status   MRSA by PCR NEGATIVE NEGATIVE Final    Comment:        The GeneXpert MRSA Assay (FDA approved for NASAL specimens only), is one component of a comprehensive MRSA colonization surveillance program. It is not intended to diagnose MRSA infection nor to guide or monitor treatment for MRSA infections.   Culture, Urine     Status: None   Collection Time: 10/18/14  1:10 PM  Result Value Ref Range Status   Specimen Description URINE, CATHETERIZED  Final   Special Requests NONE  Final   Colony Count NO GROWTH Performed at [facility]   Final   Culture NO GROWTH [facility]formed at [facility]   Final   Report Status 10/19/2014 FINAL  Final    Medical History: Past Medical History  Diagnosis Date  . Hypertension   . Vitamin D deficiency       Assessment: 89 yoM presented 3/1 with submassive PE and was started on anticoagulation.  Today, his WBC continues to increase, vitals are less stable, and CXR shows new right lower lung infiltrate.  Pharmacy is consulted to dose vancomycin and cefepime.  3/5 >> Vanc >> 3/5 >> Cefepime >>    Today, 10/20/2014:   Tmax: 99.5  WBCs: increasing, 21.4  Renal: SCr 1.75 with CrCl ~ 37 ml/min  Sputum culture pending.  Goal of Therapy:  Vancomycin trough level 15-20 mcg/ml  Plan:   Cefepime 1g IV q12h  Vancomycin 1500mg  IV once, then 1g IV q24h.  Measure Vanc trough at steady state.  Follow up renal fxn, culture results, and clinical course.  Lynann Beaverhristine Keshaun Dubey PharmD, BCPS Pager [phone] 10/20/2014 2:47 PM

## 2014-10-21 LAB — BASIC METABOLIC PANEL
Anion gap: 12 (ref 5–15)
BUN: 22 mg/dL (ref 6–23)
CHLORIDE: 95 mmol/L — AB (ref 96–112)
CO2: 22 mmol/L (ref 19–32)
Calcium: 8.3 mg/dL — ABNORMAL LOW (ref 8.4–10.5)
Creatinine, Ser: 1.73 mg/dL — ABNORMAL HIGH (ref 0.50–1.35)
GFR calc Af Amer: 45 mL/min — ABNORMAL LOW (ref >90)
GFR, EST NON AFRICAN AMERICAN: 39 mL/min — AB (ref >90)
GLUCOSE: 119 mg/dL — AB (ref 70–99)
POTASSIUM: 3.5 mmol/L (ref 3.5–5.1)
SODIUM: 129 mmol/L — AB (ref 135–145)

## 2014-10-21 LAB — CBC
HCT: 32.1 % — ABNORMAL LOW (ref 39.0–52.0)
HEMOGLOBIN: 11.3 g/dL — AB (ref 13.0–17.0)
MCH: 30.4 pg (ref 26.0–34.0)
MCHC: 35.2 g/dL (ref 30.0–36.0)
MCV: 86.3 fL (ref 78.0–100.0)
Platelets: 305 10*3/uL (ref 150–400)
RBC: 3.72 MIL/uL — AB (ref 4.22–5.81)
RDW: 14.3 % (ref 11.5–15.5)
WBC: 16.2 10*3/uL — AB (ref 4.0–10.5)

## 2014-10-21 NOTE — Progress Notes (Addendum)
Patient ID: Bryan Parker, male   DOB: 1946/05/12, 69 y.o.   MRN: [id number]  [facility] PROGRESS NOTE  ALLAH REASON RAQ:[id number] DOB: February 21, 1946 DOA: 10/16/2014 PCP: Kennon Portela, MD   Brief narrative:    69 y.o. Male with HTN, tobacco use, presented to the ED with cough, congestion, runny nose, dyspnea which progressed from dyspnea with exertion to dyspnea at rest, associated with chest tightness. In ED, CT chest positive for PE. TRH asked to admit to SDU for further evaluation  Major events since admission: 3/1 - Acute PE, started on Heparin 3/4 - Xarelto started, pt did not want Coumadin  3/5 - worse WBC, HR in 100's with RR in mid 20's, persistent hypoxia, CXR with new ? Infiltrate, ABX started  3/6 - pt looks better, WBC trending down   Assessment/Plan:    Active Problems:  Acute respiratory failure with hypoxia - secondary to acute PE initially, now also likely contribution from PNA (HCAP) based on CXR 3/5 - pt started on Heparin, appreciate pharmacy assistance with dosing, transitioned to Xarelto 3/4 - pt wants to be on Xarelto, not coumadin  - ABX added for PNA 3/5   Sepsis secondary to lobar PNA in the RLL, ? HCAP, unknown pathogen, CXR 3/5 - criteria met 3/5 with HR 106, RR 24, T 99.31F, WBC 21 K - started Vanc and Maxipime to cover for potential HCAP and will plan to narrow down as clinically indicated  - continue ABX day #2/7 - pt clinically stable this AM and reports feeling better, denies shortness of breath  - no fevers over the past 24 hours and WBC is trending down  - possible transition to oral ABX Levaquin in AM  Elevated troponin - likely secondary from demand ischemia in the setting of acute PE - appreciate cardiology assistance, no further indication for cardiac interventions or work up  [facility] as noted above    Acute blood loss anemia - drop in Hg since admission with no clear evidence of active bleeding - could be from [facility] - no indication for  transfusion - repeat CBC in AM  HTN (hypertension) - reasonable inpatient control  - continue HCTZ  Acute renal failure, hyponatremia  - pre renal. Cr slightly is still elevated, possible from developing sepsis - provided IVF but Cr remains unchanged  - Na is unchanged over the past 24 hours  - since pt tolerating diet well, will stop IVF  Hypokalemia - supplemented and WNL this AM   DVT prophylaxis: pt initially started on Heparin drip for acute PE and now transitioned to Xarelto 3/4  Code Status: Full.  Family Communication: plan of care discussed with the patient and wife at bedside  Disposition Plan: Home once more clinically stable and transitioned to oral ABX   IV access:  Peripheral IV  Procedures and diagnostic studies:    Dg Chest 2 View 10/16/2014 No edema or consolidation.   Ct Angio Chest Pe W/cm &/or Wo Cm 10/16/2014 Positive for acute PE with CT evidence of right heart strain (RV/LV Ratio = 1.4) consistent with at least submassive (intermediate risk) PE. The presence of right heart strain has been associated with an increased risk of morbidity and mortality. There is a degree of underlying centrilobular emphysema. Mild bibasilar atelectasis. No edema or consolidation. No adenopathy.   Dg Chest 2 View  10/20/2014   New right lower lung infiltrate and small right pleural effusion, most likely due to pulmonary infarct in setting of acute  pulmonary embolism.     Medical Consultants:  Pharmacy   Cardiology has signed off   Other Consultants:  None  IAnti-Infectives:   Vanc 3/5 --> Maxipime 3/5 -->   Faye Ramsay, MD  [facility] Pager [phone]  If 7PM-7AM, please contact night-coverage [email or web address] Password [email or web address] 10/21/2014, 1:55 PM   LOS: 5 days   HPI/Subjective: No events overnight. Less dyspnea and no fevers   Objective: Filed Vitals:   10/20/14 1337 10/20/14 1449 10/20/14 2137 10/21/14 0452  BP: 141/96 119/72 108/65 111/74  Pulse: 106  102  94  Temp: 99.2 F (37.3 C)  99.2 F (37.3 C) 98.2 F (36.8 C)  TempSrc: Oral  Oral Oral  Resp: _0 Height:      Weight:      SpO2: 96% 98% 94% 97%    Intake/Output Summary (Last 24 hours) at 10/21/14 1355 Last data filed at 10/21/14 1013  Gross per 24 hour  Intake 2276.75 ml  Output   2200 ml  Net  76.75 ml    Exam:   General:  Pt is alert, follows commands appropriately, not in acute distress  Cardiovascular: Regular rhythm, tachycardic, S1/S2, no murmurs, no rubs, no gallops  Respiratory: Clear to auscultation bilaterally, no wheezing, rhonchi at bases but improved since yesterday   Abdomen: Soft, non tender, non distended, bowel sounds present, no guarding  Extremities: pulses DP and PT palpable bilaterally  Neuro: Grossly nonfocal  Data Reviewed: Basic Metabolic Panel:  Recent Labs Lab 10/17/14 0750 10/18/14 0315 10/19/14 0426 10/20/14 0458 10/21/14 0544  NA 134* 130* 132* 129* 129*  K 3.3* 3.5 3.9 3.6 3.5  CL 100 99 99 94* 95*  CO2 _1 GLUCOSE 140* 121* 124* 123* 119*  BUN _2 CREATININE 1.87* 1.63* 1.60* 1.75* 1.73*  CALCIUM 9.0 8.2* 8.5 8.5 8.3*   CBC:  Recent Labs Lab 10/16/14 1558  10/17/14 0750 10/18/14 0315 10/19/14 0426 10/20/14 0458 10/21/14 0544  WBC 14.8*  < > 15.3* 14.4* 15.4* 21.4* 16.2*  NEUTROABS 11.0*  --   --   --   --   --   --   HGB 16.4  < > 15.5 14.3 13.7 12.6* 11.3*  HCT 47.1  < > 43.2 40.3 38.0* 36.0* 32.1*  MCV 87.9  < > 86.4 86.3 87.2 86.5 86.3  PLT 273  < > 235 219 260 255 305  < > = values in this interval not displayed. Cardiac Enzymes:  Recent Labs Lab 10/16/14 1424 10/16/14 1825 10/17/14 0418  TROPONINI 0.16* 0.16* 0.09*     Recent Results (from the past 240 hour(s))  MRSA PCR Screening     Status: None   Collection Time: 10/17/14  1:14 PM  Result Value Ref Range Status   MRSA by PCR NEGATIVE NEGATIVE Final    Comment:        The GeneXpert MRSA Assay (FDA approved  for NASAL specimens only), is one component of a comprehensive MRSA colonization surveillance program. It is not intended to diagnose MRSA infection nor to guide or monitor treatment for MRSA infections.   Culture, Urine     Status: None   Collection Time: 10/18/14  1:10 PM  Result Value Ref Range Status   Specimen Description URINE, CATHETERIZED  Final   Special Requests NONE  Final   Colony Count NO GROWTH Performed at [facility]   Final   Culture NO GROWTH Performed at  [facility]   Final   Report Status 10/19/2014 FINAL  Final  Culture, expectorated sputum-assessment     Status: None   Collection Time: 10/20/14  2:11 PM  Result Value Ref Range Status   Specimen Description SPUTUM  Final   Special Requests NONE  Final   Sputum evaluation   Final    THIS SPECIMEN IS ACCEPTABLE. RESPIRATORY CULTURE REPORT TO FOLLOW.   Report Status 10/20/2014 FINAL  Final  Culture, respiratory (NON-Expectorated)     Status: None (Preliminary result)   Collection Time: 10/20/14  2:11 PM  Result Value Ref Range Status   Specimen Description SPUTUM  Final   Special Requests NONE  Final   Gram Stain PENDING  Incomplete   Culture   Final    Culture reincubated for better growth Performed at [facility]    Report Status PENDING  Incomplete     Scheduled Meds: . ceFEPime (MAXIPIME) IV  1 g Intravenous Q12H  . hydrochlorothiazide  12.5 mg Oral Daily  . Rivaroxaban  15 mg Oral BID WC  . sodium chloride  3 mL Intravenous Q12H  . vancomycin  1,000 mg Intravenous Q24H  . Vitamin D (Ergocalciferol)  50,000 Units Oral Q7 days   Continuous Infusions: . sodium chloride 75 mL/hr at 10/20/14 0825

## 2014-10-22 DIAGNOSIS — J189 Pneumonia, unspecified organism: Secondary | ICD-10-CM | POA: Clinically undetermined

## 2014-10-22 DIAGNOSIS — I82431 Acute embolism and thrombosis of right popliteal vein: Secondary | ICD-10-CM | POA: Diagnosis present

## 2014-10-22 LAB — BASIC METABOLIC PANEL
ANION GAP: 9 (ref 5–15)
BUN: 19 mg/dL (ref 6–23)
CHLORIDE: 95 mmol/L — AB (ref 96–112)
CO2: 26 mmol/L (ref 19–32)
Calcium: 8.3 mg/dL — ABNORMAL LOW (ref 8.4–10.5)
Creatinine, Ser: 1.63 mg/dL — ABNORMAL HIGH (ref 0.50–1.35)
GFR calc non Af Amer: 42 mL/min — ABNORMAL LOW (ref >90)
GFR, EST AFRICAN AMERICAN: 49 mL/min — AB (ref >90)
Glucose, Bld: 119 mg/dL — ABNORMAL HIGH (ref 70–99)
POTASSIUM: 3.4 mmol/L — AB (ref 3.5–5.1)
Sodium: 130 mmol/L — ABNORMAL LOW (ref 135–145)

## 2014-10-22 LAB — FACTOR 5 LEIDEN

## 2014-10-22 LAB — CBC
HEMATOCRIT: 32.3 % — AB (ref 39.0–52.0)
Hemoglobin: 11.5 g/dL — ABNORMAL LOW (ref 13.0–17.0)
MCH: 30.7 pg (ref 26.0–34.0)
MCHC: 35.6 g/dL (ref 30.0–36.0)
MCV: 86.1 fL (ref 78.0–100.0)
Platelets: 338 10*3/uL (ref 150–400)
RBC: 3.75 MIL/uL — AB (ref 4.22–5.81)
RDW: 14.4 % (ref 11.5–15.5)
WBC: 14.2 10*3/uL — AB (ref 4.0–10.5)

## 2014-10-22 LAB — LEGIONELLA ANTIGEN, URINE

## 2014-10-22 NOTE — Progress Notes (Signed)
[facility] PROGRESS NOTE  Bryan Parker POE:[id number] DOB: [birth year] DOA: 10/16/2014 PCP: Kennon Portela, MD  Assessment/Plan: #1 acute respiratory failure with hypoxia Secondary to acute PE initially and also likely from a pneumonia based on chest x-ray of 10/20/2014. Patient was initially placed on IV heparin and subsequently transitioned to xarelto on 10/19/2014. Patient does not want to be on Coumadin. Antibiotics were also added on 10/20/2014 for pneumonia. Follow.  #2 acute PE/right lower extremity DVT Patient was initially placed on heparin. 2-D echo would right ventricular strain. Patient is is hemodynamically stable. Patient has been transitioned to xarelto. Will likely need outpatient follow-up.  #3 sepsis secondary to lobar pneumonia in the right lower lobe per chest x-ray of 10/20/2014 Sepsis criteria met with a heart rate of 106, respiratory rate of 24 white count of 21,000 with a temperature of 99.5. Patient currently on IV vancomycin and IV Maxipime antibiotic day #3 out of 7. Will transition to oral antibiotics in the next 1-2 days. Patient currently afebrile. Follow.  #4 elevated troponin Likely secondary to demand ischemia in the setting of acute PE area patient has been seen by cardiology and no further indication for cardiac workup at this time. Continue anticoagulation.  #5 acute blood loss anemia Patient with no active bleeding. Likely dilutional component. H&H stable. Follow.  #6 hypokalemia Replete.  #7 acute renal failure vs chronic kidney disease Likely secondary to a prerenal azotemia. Renal function stabilized. Follow.  #8 hypertension Stable. Continue current regimen of HCTZ.  #9 prophylaxis xarelto for DVT prophylaxis.   Code Status: Full Family Communication: Updated patient no family at bedside. Disposition Plan: Home when medically stable hopefully 1-2 days.   Consultants:  Cardiology: Dr. Fransico Him 10/16/2014  Procedures:  CT  chest 10/16/2014  Chest x-ray 10/20/2014, 10/16/2014  2-D echo 10/17/2014  Lower extremity Dopplers 10/17/2014  Antibiotics: Vanc 3/5 --> Maxipime 3/5 -->    HPI/Subjective: Patient states SOB improved. No CP.  Objective: Filed Vitals:   10/22/14 1410  BP: 118/68  Pulse: 91  Temp: 98.5 F (36.9 C)  Resp: 18    Intake/Output Summary (Last 24 hours) at 10/22/14 1518 Last data filed at 10/22/14 0935  Gross per 24 hour  Intake    420 ml  Output   1775 ml  Net  -1355 ml   Filed Weights   10/16/14 1746 10/17/14 1313 10/18/14 1030  Weight: 66.679 kg (147 lb) 62.5 kg (137 lb 12.6 oz) 63.8 kg (140 lb 10.5 oz)    Exam:   General:  NAD  Cardiovascular: RRR  Respiratory: CTAB  Abdomen: Soft/NT/ND/+BS  Musculoskeletal: No c/c/e  Data Reviewed: Basic Metabolic Panel:  Recent Labs Lab 10/18/14 0315 10/19/14 0426 10/20/14 0458 10/21/14 0544 10/22/14 0540  NA 130* 132* 129* 129* 130*  K 3.5 3.9 3.6 3.5 3.4*  CL 99 99 94* 95* 95*  CO2 _0 GLUCOSE 121* 124* 123* 119* 119*  BUN _1 CREATININE 1.63* 1.60* 1.75* 1.73* 1.63*  CALCIUM 8.2* 8.5 8.5 8.3* 8.3*   Liver Function Tests: No results for input(s): AST, ALT, ALKPHOS, BILITOT, PROT, ALBUMIN in the last 168 hours. No results for input(s): LIPASE, AMYLASE in the last 168 hours. No results for input(s): AMMONIA in the last 168 hours. CBC:  Recent Labs Lab 10/16/14 1558  10/18/14 0315 10/19/14 0426 10/20/14 0458 10/21/14 0544 10/22/14 0540  WBC 14.8*  < > 14.4* 15.4* 21.4* 16.2* 14.2*  NEUTROABS 11.0*  --   --   --   --   --   --  HGB 16.4  < > 14.3 13.7 12.6* 11.3* 11.5*  HCT 47.1  < > 40.3 38.0* 36.0* 32.1* 32.3*  MCV 87.9  < > 86.3 87.2 86.5 86.3 86.1  PLT 273  < > 219 260 255 305 338  < > = values in this interval not displayed. Cardiac Enzymes:  Recent Labs Lab 10/16/14 1424 10/16/14 1825 10/17/14 0418  TROPONINI 0.16* 0.16* 0.09*   BNP (last 3  results)  Recent Labs  10/16/14 1423  BNP 57.9    ProBNP (last 3 results) No results for input(s): PROBNP in the last 8760 hours.  CBG: No results for input(s): GLUCAP in the last 168 hours.  Recent Results (from the past 240 hour(s))  MRSA PCR Screening     Status: None   Collection Time: 10/17/14  1:14 PM  Result Value Ref Range Status   MRSA by PCR NEGATIVE NEGATIVE Final    Comment:        The GeneXpert MRSA Assay (FDA approved for NASAL specimens only), is one component of a comprehensive MRSA colonization surveillance program. It is not intended to diagnose MRSA infection nor to guide or monitor treatment for MRSA infections.   Culture, Urine     Status: None   Collection Time: 10/18/14  1:10 PM  Result Value Ref Range Status   Specimen Description URINE, CATHETERIZED  Final   Special Requests NONE  Final   Colony Count NO GROWTH Performed at [facility]   Final   Culture NO GROWTH Performed at [facility]   Final   Report Status 10/19/2014 FINAL  Final  Culture, expectorated sputum-assessment     Status: None   Collection Time: 10/20/14  2:11 PM  Result Value Ref Range Status   Specimen Description SPUTUM  Final   Special Requests NONE  Final   Sputum evaluation   Final    THIS SPECIMEN IS ACCEPTABLE. RESPIRATORY CULTURE REPORT TO FOLLOW.   Report Status 10/20/2014 FINAL  Final  Culture, respiratory (NON-Expectorated)     Status: None (Preliminary result)   Collection Time: 10/20/14  2:11 PM  Result Value Ref Range Status   Specimen Description SPUTUM  Final   Special Requests NONE  Final   Gram Stain PENDING  Incomplete   Culture   Final    NORMAL OROPHARYNGEAL FLORA Performed at [facility]    Report Status PENDING  Incomplete     Studies: No results found.  Scheduled Meds: . ceFEPime (MAXIPIME) IV  1 g Intravenous Q12H  . hydrochlorothiazide  12.5 mg Oral Daily  . Rivaroxaban  15 mg Oral BID WC  . sodium  chloride  3 mL Intravenous Q12H  . vancomycin  1,000 mg Intravenous Q24H  . Vitamin D (Ergocalciferol)  50,000 Units Oral Q7 days   Continuous Infusions:   Principal Problem:   PE (pulmonary embolism) Active Problems:   Acute deep vein thrombosis (DVT) of popliteal vein of right lower extremity   Elevated troponin   HTN (hypertension)    Time spent: 35 mins    [facility] MD [facility] Pager [phone]. If 7PM-7AM, please contact night-coverage at [email or web address], password [facility] 10/22/2014, 3:18 PM  LOS: 6 days

## 2014-10-22 NOTE — Progress Notes (Signed)
ANTICOAGULATION CONSULT NOTE - Follow up  Pharmacy Consult for Xarelto Indication: PE  No Known Allergies  Patient Measurements: Height: 5\' 8"  (172.7 cm) Weight: 140 lb 10.5 oz (63.8 kg) IBW/kg (Calculated) : 68.4 Heparin Dosing Weight: 67kg  Vital Signs: Temp: 98 F (36.7 C) (03/07 0544) Temp Source: Oral (03/07 0544) BP: 136/79 mmHg (03/07 0544) Pulse Rate: 89 (03/07 0544)  Labs:  Recent Labs  10/20/14 0458 10/21/14 0544 10/22/14 0540  HGB 12.6* 11.3* 11.5*  HCT 36.0* 32.1* 32.3*  PLT 255 305 338  CREATININE 1.75* 1.73* 1.63*    Estimated Creatinine Clearance: 39.7 mL/min (by C-G formula based on Cr of 1.63).   Medical History: Past Medical History  Diagnosis Date  . Hypertension   . Vitamin D deficiency    Assessment: [age] YOM presents with chest pain and shortness of breath,  Cardiac enzymes mildly elevated.  Chest pain is atypical as it was reproducible with cough and palpation.  EKG w/o ischemic changes.  D-dimer > 20.  Heparin started for r/o ACS, but CT now reveals submassive PE w/ evidence of R heart strain.  Significant events Baseline CBC, INR wnl No baseline aPTT drawn Baseline SCr elevated, CrCl ~40 ml/min   Today, 10/22/2014:   CBC: Hgb = 11.5, pltc WNL  No bleeding reported/documented.  SCr elevated but improving. CrCl >30.  Goal of Therapy:  Monitor platelets by anticoagulation protocol: Yes   Plan:   Continue xarelto 15mg  PO BID w/meals x 21 days (until 3/25) then 20mg  PO daily starting 3/26  Monitor CBC, clinical course.   MD please prescribe this at discharge and instruct patient [facility]. If discharged on Sat or Sun, we will have to call pharmacies to find one that has a Starter pack in stock.    Follow at a distance as not adjustment or intervention needed at this time  Juliette Alcideustin Rockell Faulks, PharmD, BCPS.   Pager: [phone]  10/22/2014,11:53 AM.

## 2014-10-22 NOTE — Evaluation (Signed)
Physical Therapy Evaluation Patient Details Name: Bryan Parker MRN: [id number] DOB: 1945-12-11 Today's Date: 10/22/2014      History of Present Illness  69 yo male admitted with PE, sepsis, elevated troponin. Hx of HTN.   Clinical Impression  On eval, pt was Min guard assist for ambulation-~200 feet without assistive device. O2 sats: 99% 2L at rest; 85-91% on RA during ambulation. Replaced 2L O2 end of session-92%.     Follow Up Recommendations No PT follow up;Supervision - Intermittent    Equipment Recommendations  None recommended by PT    Recommendations for Other Services       Precautions / Restrictions Precautions Precautions: Fall Precaution Comments: monitor sats Restrictions Weight Bearing Restrictions: No      Mobility  Bed Mobility               General bed mobility comments: pt oob in recliner  Transfers     Transfers: Sit to/from Stand Sit to Stand: Supervision            Ambulation/Gait Ambulation/Gait assistance: Min guard Ambulation Distance (Feet): 200 Feet Assistive device: None Gait Pattern/deviations: Step-through pattern     General Gait Details: slightly unsteady at times but no LOB. 2 brief standing rest breaks for pursed lip breathing/rest. Pt tolerated activity well.   Stairs            Wheelchair Mobility    Modified Rankin (Stroke Patients Only)       Balance                                             Pertinent Vitals/Pain Pain Assessment: No/denies pain    Home Living Family/patient expects to be discharged to:: Private residence Living Arrangements: Spouse/significant other   Type of Home: House Home Access: Stairs to enter   Secretary/administrator of Steps: 1 Home Layout: One level Home Equipment: None      Prior Function Level of Independence: Independent               Hand Dominance        Extremity/Trunk Assessment   Upper Extremity Assessment: Overall WFL  for tasks assessed           Lower Extremity Assessment: Generalized weakness      Cervical / Trunk Assessment: Normal  Communication   Communication: No difficulties  Cognition Arousal/Alertness: Awake/alert Behavior During Therapy: WFL for tasks assessed/performed Overall Cognitive Status: Within Functional Limits for tasks assessed                      General Comments      Exercises        Assessment/Plan    PT Assessment Patient needs continued PT services  PT Diagnosis Difficulty walking;Generalized weakness   PT Problem List Decreased mobility;Decreased activity tolerance  PT Treatment Interventions Gait training;Functional mobility training;Therapeutic activities;Therapeutic exercise;Patient/family education   PT Goals (Current goals can be found in the Care Plan section) Acute Rehab PT Goals Patient Stated Goal: home soon PT Goal Formulation: With patient/family Time For Goal Achievement: 11/05/14 Potential to Achieve Goals: Good    Frequency Min 3X/week   Barriers to discharge        Co-evaluation               End of Session   Activity Tolerance: Patient tolerated treatment  well Patient left: in chair;with call bell/phone within reach;with family/visitor present           Time: [id number] PT Time Calculation (min) (ACUTE ONLY): 14 min   Charges:   PT Evaluation $Initial PT Evaluation Tier I: 1 Procedure     PT G Codes:        Rebeca AlertJannie Kristine Chahal, MPT Pager: [phone]

## 2014-10-23 DIAGNOSIS — N189 Chronic kidney disease, unspecified: Secondary | ICD-10-CM | POA: Diagnosis present

## 2014-10-23 LAB — BASIC METABOLIC PANEL
ANION GAP: 11 (ref 5–15)
BUN: 18 mg/dL (ref 6–23)
CO2: 27 mmol/L (ref 19–32)
Calcium: 8.9 mg/dL (ref 8.4–10.5)
Chloride: 96 mmol/L (ref 96–112)
Creatinine, Ser: 1.57 mg/dL — ABNORMAL HIGH (ref 0.50–1.35)
GFR calc non Af Amer: 44 mL/min — ABNORMAL LOW (ref >90)
GFR, EST AFRICAN AMERICAN: 51 mL/min — AB (ref >90)
Glucose, Bld: 119 mg/dL — ABNORMAL HIGH (ref 70–99)
POTASSIUM: 3.7 mmol/L (ref 3.5–5.1)
Sodium: 134 mmol/L — ABNORMAL LOW (ref 135–145)

## 2014-10-23 LAB — CBC
HEMATOCRIT: 32.5 % — AB (ref 39.0–52.0)
Hemoglobin: 11.5 g/dL — ABNORMAL LOW (ref 13.0–17.0)
MCH: 30.4 pg (ref 26.0–34.0)
MCHC: 35.4 g/dL (ref 30.0–36.0)
MCV: 86 fL (ref 78.0–100.0)
PLATELETS: 456 10*3/uL — AB (ref 150–400)
RBC: 3.78 MIL/uL — ABNORMAL LOW (ref 4.22–5.81)
RDW: 14.3 % (ref 11.5–15.5)
WBC: 12.6 10*3/uL — AB (ref 4.0–10.5)

## 2014-10-23 LAB — CULTURE, RESPIRATORY

## 2014-10-23 LAB — CULTURE, RESPIRATORY W GRAM STAIN: Culture: NORMAL

## 2014-10-23 LAB — PROTHROMBIN GENE MUTATION

## 2014-10-23 MED ORDER — OXYCODONE-ACETAMINOPHEN 5-325 MG PO TABS: 1.0000 | ORAL_TABLET | ORAL | Status: AC | PRN

## 2014-10-23 MED ORDER — LEVOFLOXACIN 750 MG PO TABS: 750.0000 mg | ORAL_TABLET | ORAL | Status: AC

## 2014-10-23 MED ORDER — RIVAROXABAN 20 MG PO TABS: 20.0000 mg | ORAL_TABLET | Freq: Every day | ORAL | Status: AC

## 2014-10-23 MED ORDER — LEVOFLOXACIN 750 MG PO TABS
750.0000 mg | ORAL_TABLET | ORAL | Status: DC
  Administered 2014-10-23: 750 mg via ORAL
  Filled 2014-10-23: qty 1

## 2014-10-23 MED ORDER — RIVAROXABAN (XARELTO) VTE STARTER PACK (15 & 20 MG): ORAL_TABLET | ORAL | Status: AC

## 2014-10-23 NOTE — Discharge Summary (Signed)
Physician Discharge Summary  Bryan Parker BXU:[id number] DOB: 1946-05-09 DOA: 10/16/2014  PCP: Kennon Portela, MD  Admit date: 10/16/2014 Discharge date: 10/23/2014  Time spent: 65 minutes  Recommendations for Outpatient Follow-up:  1. Follow-up with CARD,JOHN P, MD in 1 week. On follow-up patient's pneumonia need to be reassessed. Patient on need a CBC done to follow-up on his H&H as he is on xarelto. Patient needs a basic metabolic profile done to follow-up on electrolytes and renal function.  Discharge Diagnoses:  Principal Problem:   PE (pulmonary embolism) Active Problems:   Acute deep vein thrombosis (DVT) of popliteal vein of right lower extremity   Elevated troponin   HTN (hypertension)   HCAP (healthcare-associated pneumonia)   CKD (chronic kidney disease)   Discharge Condition: Stable and improved  Diet recommendation: Regular  Filed Weights   10/16/14 1746 10/17/14 1313 10/18/14 1030  Weight: 66.679 kg (147 lb) 62.5 kg (137 lb 12.6 oz) 63.8 kg (140 lb 10.5 oz)    History of present illness:  Per Dr. Domenic Polite Bryan Parker is a 69 y.o. male He had a past medical history of hypertension And previous tobacco use presented to the ER with the above complaints of chest pain. Patient reported cough congestion runny nose and shortness of breath for the last 3-4 days prior to admission. He also noticed severe pain and hit his chest when he coughs. No fevers or chills no dizziness no lower extremity edema. In the ER he was noted to have mildly elevated troponin, Significantly elevated d-dimer and CTA positive for acute PE   Hospital Course:  #1 acute respiratory failure with hypoxia Secondary to acute PE initially and also likely from a pneumonia based on chest x-ray of 10/20/2014. Patient was initially placed on IV heparin and empiric IV antibiotics of vancomycin and cefepime. Patient was subsequently transitioned to xarelto on 10/19/2014. Patient does not want to be  on Coumadin. Patient improved clinically his hypoxia resolved and patient was discharged home on oral antibiotics to complete a one-week course of antibiotic therapy as well as xarelto.  Patient will follow-up with PCP as outpatient. .  #2 acute PE/right lower extremity DVT Patient was admitted with an acute PE noted on CT angiogram of the chest. initially placed on heparin. 2-D echo done showed a right ventricular strain. Patient was admitted to telemetry. Patient was seen in consultation by cardiology. Lower extremity Dopplers done showed a right lower extremity DVT. Patient remained hemodynamically stable. Patient was subsequently transitioned to [place]. Patient tolerated xarelto with no complications. Patient will follow-up with PCP as outpatient.  #3 sepsis secondary to lobar pneumonia in the right lower lobe per chest x-ray of 10/20/2014 Sepsis criteria met with a heart rate of 106, respiratory rate of 24 white count of 21,000 with a temperature of 99.5. Patient was pancultured and treated for pneumonia noted on chest x-ray of 10/20/2014, with empiric IV vancomycin and IV Maxipime. Patient improved clinically and was subsequently transitioned to oral antibiotics. Patient will be discharged home on oral antibiotics to complete a one-week course of antibiotic therapy.   #4 elevated troponin Likely secondary to demand ischemia in the setting of acute PE.  Patient was seen by cardiology and no further indication for cardiac workup at this time. Continued on anticoagulation.  #5 acute blood loss anemia Patient with no active bleeding. Likely dilutional component. H&H remained stable.   #6 hypokalemia Repleted.  #7 acute renal failure vs chronic kidney disease Likely secondary to a prerenal  azotemia. Renal function stabilized with hydration.   #8 hypertension Stable. Continued on home regimen of HCTZ.  Procedures:  CT chest 10/16/2014  Chest x-ray 10/20/2014, 10/16/2014  2-D echo  10/17/2014  Lower extremity Dopplers 10/17/2014    Consultations:  Cardiology: Dr. Fransico Him 10/16/2014  Discharge Exam: Filed Vitals:   10/23/14 0522  BP: 146/83  Pulse: 73  Temp: 98.4 F (36.9 C)  Resp: 22    General: NAD Cardiovascular: RRR Respiratory: CTAB  Discharge Instructions   Discharge Instructions    Diet general    Complete by:  As directed      Discharge instructions    Complete by:  As directed   Follow up with CARD,JOHN P, MD in 1 week.     Increase activity slowly    Complete by:  As directed           Current Discharge Medication List    START taking these medications   Details  levofloxacin (LEVAQUIN) 750 MG tablet Take 1 tablet (750 mg total) by mouth every other day. Take for 4 days then stop. Qty: 2 tablet, Refills: 0    oxyCODONE-acetaminophen (PERCOCET/ROXICET) 5-325 MG per tablet Take 1-2 tablets by mouth every 3 (three) hours as needed for moderate pain. Qty: 15 tablet, Refills: 0    Rivaroxaban (XARELTO STARTER PACK) 15 & 20 MG TBPK Take as directed on package: Start with one 26m tablet by mouth twice a day with food. On Day 22, switch to one 226mtablet once a day with food. Take 15 mg 2 times daily through 11/09/14, then 20 mg daily starting 11/10/14. Qty: 51 each, Refills: 0    rivaroxaban (XARELTO) 20 MG TABS tablet Take 1 tablet (20 mg total) by mouth daily with supper. Qty: 30 tablet, Refills: 2      CONTINUE these medications which have NOT CHANGED   Details  DiphenhydrAMINE HCl (ALKA-SELTZER PLUS ALLERGY PO) Take 2 each by mouth daily as needed (cold symptoms). Dissolve 2 tablets in water    hydrochlorothiazide (HYDRODIURIL) 12.5 MG tablet Take 12.5 mg by mouth daily.    naproxen sodium (ANAPROX) 220 MG tablet Take 440 mg by mouth at bedtime as needed (for wrist pain).    polyethylene glycol (MIRALAX / GLYCOLAX) packet Take 17 g by mouth daily.    Vitamin D, Ergocalciferol, (DRISDOL) 50000 UNITS CAPS capsule Take  50,000 Units by mouth every 7 (seven) days.       No Known Allergies Follow-up Information    Follow up with CARD,JOHN P, MD. Schedule an appointment as soon as possible for a visit in 1 week.   Specialty:  Internal Medicine   Contact information:   [phone]      The results of significant diagnostics from this hospitalization (including imaging, microbiology, ancillary and laboratory) are listed below for reference.    Significant Diagnostic Studies: Dg Chest 2 View  10/20/2014   CLINICAL DATA:  Dyspnea. Right-sided chest pain. Acute pulmonary embolism.  EXAM: CHEST  2 VIEW  COMPARISON:  10/16/2014  FINDINGS: New pulmonary infiltrate is seen in right lung base as well as new small right pleural effusion. Left lung remains clear. No pneumothorax identified. Heart size and mediastinal contours are within normal limits.  IMPRESSION: New right lower lung infiltrate and small right pleural effusion, most likely due to pulmonary infarct in setting of acute pulmonary embolism.   Electronically Signed   By: JoSharrie Rothman.  On: 10/20/2014 11:24   Dg Chest 2 View  10/16/2014   CLINICAL DATA:  Cough and difficulty breathing for 2 days  EXAM: CHEST  2 VIEW  COMPARISON:  None.  FINDINGS: There is no edema or consolidation. The heart size and pulmonary vascularity are normal. No adenopathy. There is postoperative change in the lower cervical spine.  IMPRESSION: No edema or consolidation.   Electronically Signed   By: Lowella Grip III M.D.   On: 10/16/2014 14:57   Ct Angio Chest Pe W/cm &/or Wo Cm  10/16/2014   CLINICAL DATA:  Two day history of chest pain and difficulty breathing  EXAM: CT ANGIOGRAPHY CHEST WITH CONTRAST  TECHNIQUE: Multidetector CT imaging of the chest was performed using the standard protocol during bolus administration of intravenous contrast. Multiplanar CT image reconstructions and MIPs were obtained to evaluate the vascular  anatomy.  CONTRAST:  139m OMNIPAQUE IOHEXOL 350 MG/ML SOLN  COMPARISON:  Chest radiograph October 16, 2014  FINDINGS: There is pulmonary embolism arising from the distal right main pulmonary artery with extension into proximal upper and lower lobe pulmonary arteries. On the left, there is pulmonary embolus arising at the origins of the upper and lower lobe pulmonary arteries on the left. There is right heart strain with a right ventricle to left ventricle diameter ratio of 1.4, normal less than 0.9. There is no thoracic aortic aneurysm or dissection.  There is underlying centrilobular emphysema. There is mild atelectasis in each lung base. There is no edema or consolidation. There is mild lower lobe bronchiectatic change bilaterally.  There is no appreciable thoracic adenopathy. The pericardium is not thickened.  In the visualized upper abdomen, no lesions are identified.  There are no blastic or lytic bone lesions. Thyroid appears unremarkable.  Review of the MIP images confirms the above findings.  IMPRESSION: Positive for acute PE with CT evidence of right heart strain (RV/LV Ratio = 1.4) consistent with at least submassive (intermediate risk) PE. The presence of right heart strain has been associated with an increased risk of morbidity and mortality.  There is a degree of underlying centrilobular emphysema. Mild bibasilar atelectasis. No edema or consolidation. No adenopathy.  Critical Value/emergent results were called by telephone at the time of interpretation on 10/16/2014 at 6:47 pm to [facility] , who verbally acknowledged these results.   Electronically Signed   By: WLowella GripIII M.D.   On: 10/16/2014 18:47    Microbiology: Recent Results (from the past 240 hour(s))  MRSA PCR Screening     Status: None   Collection Time: 10/17/14  1:14 PM  Result Value Ref Range Status   MRSA by PCR NEGATIVE NEGATIVE Final    Comment:        The GeneXpert MRSA Assay (FDA approved for NASAL  specimens only), is one component of a comprehensive MRSA colonization surveillance program. It is not intended to diagnose MRSA infection nor to guide or monitor treatment for MRSA infections.   Culture, Urine     Status: None   Collection Time: 10/18/14  1:10 PM  Result Value Ref Range Status   Specimen Description URINE, CATHETERIZED  Final   Special Requests NONE  Final   Col[facility] GROWTH [facility]  [facility]   Culture NO GROWTH [facility]  Final   Report Status 10/19/2014 FINAL  Final  Culture, expectorated sputum-assessment     Status: None   Collection Time: 10/20/14  2:11 PM  Result Value Ref Range Status   Specimen Description SPUTUM  Final   Special Requests NONE  Final   Sputum evaluation   Final    THIS SPECIMEN IS ACCEPTABLE. RESPIRATORY CULTURE REPORT TO FOLLOW.   Report Status 10/20/2014 FINAL  Final  Culture, respiratory (NON-Expectorated)     Status: None   Collection Time: 10/20/14  2:11 PM  Result Value Ref Range Status   Specimen Description SPUTUM  Final   Special Requests NONE  Final   Gram Stain   Final    MODERATE WBC PRESENT, PREDOMINANTLY PMN RARE SQUAMOUS EPITHELIAL CELLS PRESENT FEW GRAM POSITIVE COCCI IN PAIRS IN CHAINS FEW GRAM NEGATIVE RODS RARE GRAM POSITIVE RODS    Culture   Final    NORMAL OROPHARYNGEAL FLORA Performed at [facility]    Report Status 10/23/2014 FINAL  Final     Labs: Basic Metabolic Panel:  Recent Labs Lab 10/19/14 0426 10/20/14 0458 10/21/14 0544 10/22/14 0540 10/23/14 0515  NA 132* 129* 129* 130* 134*  K 3.9 3.6 3.5 3.4* 3.7  CL 99 94* 95* 95* 96  CO2 23 25 22 26 27   GLUCOSE 124* 123* 119* 119* 119*  BUN 19 21 22 19 18   CREATININE 1.60* 1.75* 1.73* 1.63* 1.57*  CALCIUM 8.5 8.5 8.3* 8.3* 8.9   Liver Function Tests: No results for input(s): AST, ALT, ALKPHOS, BILITOT, PROT, ALBUMIN in the last 168 hours. No results for input(s): LIPASE,  AMYLASE in the last 168 hours. No results for input(s): AMMONIA in the last 168 hours. CBC:  Recent Labs Lab 10/16/14 1558  10/19/14 0426 10/20/14 0458 10/21/14 0544 10/22/14 0540 10/23/14 0515  WBC 14.8*  < > 15.4* 21.4* 16.2* 14.2* 12.6*  NEUTROABS 11.0*  --   --   --   --   --   --   HGB 16.4  < > 13.7 12.6* 11.3* 11.5* 11.5*  HCT 47.1  < > 38.0* 36.0* 32.1* 32.3* 32.5*  MCV 87.9  < > 87.2 86.5 86.3 86.1 86.0  PLT 273  < > 260 255 305 338 456*  < > = values in this interval not displayed. Cardiac Enzymes:  Recent Labs Lab 10/16/14 1825 10/17/14 0418  TROPONINI 0.16* 0.09*   BNP: BNP (last 3 results)  Recent Labs  10/16/14 1423  BNP 57.9    ProBNP (last 3 results) No results for input(s): PROBNP in the last 8760 hours.  CBG: No results for input(s): GLUCAP in the last 168 hours.     SignedIrine Seal MD [facility] 10/23/2014, 2:46 PM

## 2014-10-23 NOTE — Progress Notes (Signed)
SATURATION QUALIFICATIONS: (This note is used to comply with regulatory documentation for home oxygen)  Patient Saturations on Room Air at Rest = 96%  Patient Saturations on Room Air while Ambulating = 91-95%  Patient Saturations on 0Liters of oxygen while Ambulating = n/a  Please briefly explain why patient needs home oxygen: pt does not meet criteria for oxygen at discharge. Pt ambulated 400 ft with no SOB and O2 sats maintained 91-95% on RA. Pt was able to ambulate with standby assist from his wife.   Arta Bruceeutsch, Tye Vigo [facility]

## 2018-06-05 ENCOUNTER — Inpatient Hospital Stay (HOSPITAL_COMMUNITY): Payer: Medicare Other

## 2018-06-05 ENCOUNTER — Emergency Department (HOSPITAL_COMMUNITY): Payer: Medicare Other

## 2018-06-05 ENCOUNTER — Inpatient Hospital Stay (HOSPITAL_COMMUNITY)
Admission: EM | Admit: 2018-06-05 | Discharge: 2018-07-17 | DRG: 004 | Disposition: E | Payer: Medicare Other | Attending: Pulmonary Disease | Admitting: Pulmonary Disease

## 2018-06-05 ENCOUNTER — Encounter (HOSPITAL_COMMUNITY): Payer: Self-pay

## 2018-06-05 DIAGNOSIS — Z515 Encounter for palliative care: Secondary | ICD-10-CM

## 2018-06-05 DIAGNOSIS — E43 Unspecified severe protein-calorie malnutrition: Secondary | ICD-10-CM | POA: Diagnosis present

## 2018-06-05 DIAGNOSIS — T38895A Adverse effect of other hormones and synthetic substitutes, initial encounter: Secondary | ICD-10-CM | POA: Diagnosis not present

## 2018-06-05 DIAGNOSIS — N170 Acute kidney failure with tubular necrosis: Secondary | ICD-10-CM | POA: Diagnosis not present

## 2018-06-05 DIAGNOSIS — N184 Chronic kidney disease, stage 4 (severe): Secondary | ICD-10-CM | POA: Diagnosis present

## 2018-06-05 DIAGNOSIS — J9811 Atelectasis: Secondary | ICD-10-CM | POA: Diagnosis not present

## 2018-06-05 DIAGNOSIS — R6521 Severe sepsis with septic shock: Secondary | ICD-10-CM | POA: Diagnosis not present

## 2018-06-05 DIAGNOSIS — F419 Anxiety disorder, unspecified: Secondary | ICD-10-CM | POA: Diagnosis not present

## 2018-06-05 DIAGNOSIS — Z430 Encounter for attention to tracheostomy: Secondary | ICD-10-CM

## 2018-06-05 DIAGNOSIS — M6282 Rhabdomyolysis: Secondary | ICD-10-CM | POA: Diagnosis not present

## 2018-06-05 DIAGNOSIS — Z79899 Other long term (current) drug therapy: Secondary | ICD-10-CM

## 2018-06-05 DIAGNOSIS — J069 Acute upper respiratory infection, unspecified: Secondary | ICD-10-CM | POA: Diagnosis present

## 2018-06-05 DIAGNOSIS — Z978 Presence of other specified devices: Secondary | ICD-10-CM

## 2018-06-05 DIAGNOSIS — Z930 Tracheostomy status: Secondary | ICD-10-CM

## 2018-06-05 DIAGNOSIS — I480 Paroxysmal atrial fibrillation: Secondary | ICD-10-CM | POA: Diagnosis present

## 2018-06-05 DIAGNOSIS — D696 Thrombocytopenia, unspecified: Secondary | ICD-10-CM | POA: Diagnosis not present

## 2018-06-05 DIAGNOSIS — Y838 Other surgical procedures as the cause of abnormal reaction of the patient, or of later complication, without mention of misadventure at the time of the procedure: Secondary | ICD-10-CM | POA: Diagnosis not present

## 2018-06-05 DIAGNOSIS — A419 Sepsis, unspecified organism: Secondary | ICD-10-CM | POA: Diagnosis not present

## 2018-06-05 DIAGNOSIS — J9501 Hemorrhage from tracheostomy stoma: Secondary | ICD-10-CM | POA: Diagnosis not present

## 2018-06-05 DIAGNOSIS — I370 Nonrheumatic pulmonary valve stenosis: Secondary | ICD-10-CM | POA: Diagnosis not present

## 2018-06-05 DIAGNOSIS — Z86718 Personal history of other venous thrombosis and embolism: Secondary | ICD-10-CM

## 2018-06-05 DIAGNOSIS — D631 Anemia in chronic kidney disease: Secondary | ICD-10-CM | POA: Diagnosis present

## 2018-06-05 DIAGNOSIS — I130 Hypertensive heart and chronic kidney disease with heart failure and stage 1 through stage 4 chronic kidney disease, or unspecified chronic kidney disease: Secondary | ICD-10-CM | POA: Diagnosis present

## 2018-06-05 DIAGNOSIS — J9601 Acute respiratory failure with hypoxia: Secondary | ICD-10-CM

## 2018-06-05 DIAGNOSIS — L89629 Pressure ulcer of left heel, unspecified stage: Secondary | ICD-10-CM | POA: Diagnosis not present

## 2018-06-05 DIAGNOSIS — Y846 Urinary catheterization as the cause of abnormal reaction of the patient, or of later complication, without mention of misadventure at the time of the procedure: Secondary | ICD-10-CM | POA: Diagnosis not present

## 2018-06-05 DIAGNOSIS — R739 Hyperglycemia, unspecified: Secondary | ICD-10-CM | POA: Diagnosis not present

## 2018-06-05 DIAGNOSIS — Z452 Encounter for adjustment and management of vascular access device: Secondary | ICD-10-CM

## 2018-06-05 DIAGNOSIS — K567 Ileus, unspecified: Secondary | ICD-10-CM | POA: Diagnosis not present

## 2018-06-05 DIAGNOSIS — I2602 Saddle embolus of pulmonary artery with acute cor pulmonale: Secondary | ICD-10-CM

## 2018-06-05 DIAGNOSIS — J9600 Acute respiratory failure, unspecified whether with hypoxia or hypercapnia: Secondary | ICD-10-CM

## 2018-06-05 DIAGNOSIS — R310 Gross hematuria: Secondary | ICD-10-CM | POA: Diagnosis not present

## 2018-06-05 DIAGNOSIS — R0602 Shortness of breath: Secondary | ICD-10-CM

## 2018-06-05 DIAGNOSIS — G9340 Encephalopathy, unspecified: Secondary | ICD-10-CM | POA: Diagnosis not present

## 2018-06-05 DIAGNOSIS — R001 Bradycardia, unspecified: Secondary | ICD-10-CM | POA: Diagnosis not present

## 2018-06-05 DIAGNOSIS — D62 Acute posthemorrhagic anemia: Secondary | ICD-10-CM | POA: Diagnosis not present

## 2018-06-05 DIAGNOSIS — J690 Pneumonitis due to inhalation of food and vomit: Secondary | ICD-10-CM | POA: Diagnosis present

## 2018-06-05 DIAGNOSIS — I2609 Other pulmonary embolism with acute cor pulmonale: Principal | ICD-10-CM | POA: Diagnosis present

## 2018-06-05 DIAGNOSIS — J432 Centrilobular emphysema: Secondary | ICD-10-CM | POA: Diagnosis present

## 2018-06-05 DIAGNOSIS — I313 Pericardial effusion (noninflammatory): Secondary | ICD-10-CM | POA: Diagnosis present

## 2018-06-05 DIAGNOSIS — I272 Pulmonary hypertension, unspecified: Secondary | ICD-10-CM | POA: Diagnosis present

## 2018-06-05 DIAGNOSIS — Z791 Long term (current) use of non-steroidal anti-inflammatories (NSAID): Secondary | ICD-10-CM

## 2018-06-05 DIAGNOSIS — R609 Edema, unspecified: Secondary | ICD-10-CM | POA: Diagnosis not present

## 2018-06-05 DIAGNOSIS — N189 Chronic kidney disease, unspecified: Secondary | ICD-10-CM | POA: Diagnosis present

## 2018-06-05 DIAGNOSIS — Z9079 Acquired absence of other genital organ(s): Secondary | ICD-10-CM

## 2018-06-05 DIAGNOSIS — E875 Hyperkalemia: Secondary | ICD-10-CM | POA: Diagnosis not present

## 2018-06-05 DIAGNOSIS — K7589 Other specified inflammatory liver diseases: Secondary | ICD-10-CM | POA: Diagnosis not present

## 2018-06-05 DIAGNOSIS — J9602 Acute respiratory failure with hypercapnia: Secondary | ICD-10-CM | POA: Diagnosis not present

## 2018-06-05 DIAGNOSIS — R1110 Vomiting, unspecified: Secondary | ICD-10-CM

## 2018-06-05 DIAGNOSIS — J9622 Acute and chronic respiratory failure with hypercapnia: Secondary | ICD-10-CM | POA: Diagnosis present

## 2018-06-05 DIAGNOSIS — I071 Rheumatic tricuspid insufficiency: Secondary | ICD-10-CM | POA: Diagnosis present

## 2018-06-05 DIAGNOSIS — E872 Acidosis, unspecified: Secondary | ICD-10-CM

## 2018-06-05 DIAGNOSIS — N401 Enlarged prostate with lower urinary tract symptoms: Secondary | ICD-10-CM | POA: Diagnosis present

## 2018-06-05 DIAGNOSIS — E559 Vitamin D deficiency, unspecified: Secondary | ICD-10-CM | POA: Diagnosis present

## 2018-06-05 DIAGNOSIS — S80821A Blister (nonthermal), right lower leg, initial encounter: Secondary | ICD-10-CM | POA: Diagnosis not present

## 2018-06-05 DIAGNOSIS — G9341 Metabolic encephalopathy: Secondary | ICD-10-CM | POA: Diagnosis present

## 2018-06-05 DIAGNOSIS — E871 Hypo-osmolality and hyponatremia: Secondary | ICD-10-CM | POA: Diagnosis not present

## 2018-06-05 DIAGNOSIS — E861 Hypovolemia: Secondary | ICD-10-CM | POA: Diagnosis not present

## 2018-06-05 DIAGNOSIS — R0609 Other forms of dyspnea: Secondary | ICD-10-CM | POA: Diagnosis not present

## 2018-06-05 DIAGNOSIS — J9621 Acute and chronic respiratory failure with hypoxia: Secondary | ICD-10-CM | POA: Diagnosis present

## 2018-06-05 DIAGNOSIS — I82432 Acute embolism and thrombosis of left popliteal vein: Secondary | ICD-10-CM | POA: Diagnosis present

## 2018-06-05 DIAGNOSIS — R0603 Acute respiratory distress: Secondary | ICD-10-CM

## 2018-06-05 DIAGNOSIS — R680 Hypothermia, not associated with low environmental temperature: Secondary | ICD-10-CM | POA: Diagnosis present

## 2018-06-05 DIAGNOSIS — R570 Cardiogenic shock: Secondary | ICD-10-CM | POA: Diagnosis present

## 2018-06-05 DIAGNOSIS — Z4659 Encounter for fitting and adjustment of other gastrointestinal appliance and device: Secondary | ICD-10-CM

## 2018-06-05 DIAGNOSIS — T8383XA Hemorrhage of genitourinary prosthetic devices, implants and grafts, initial encounter: Secondary | ICD-10-CM | POA: Diagnosis not present

## 2018-06-05 DIAGNOSIS — N39490 Overflow incontinence: Secondary | ICD-10-CM | POA: Diagnosis present

## 2018-06-05 DIAGNOSIS — Z8249 Family history of ischemic heart disease and other diseases of the circulatory system: Secondary | ICD-10-CM

## 2018-06-05 DIAGNOSIS — Z86711 Personal history of pulmonary embolism: Secondary | ICD-10-CM

## 2018-06-05 DIAGNOSIS — L89819 Pressure ulcer of head, unspecified stage: Secondary | ICD-10-CM | POA: Diagnosis not present

## 2018-06-05 DIAGNOSIS — I248 Other forms of acute ischemic heart disease: Secondary | ICD-10-CM | POA: Diagnosis present

## 2018-06-05 DIAGNOSIS — N368 Other specified disorders of urethra: Secondary | ICD-10-CM | POA: Diagnosis not present

## 2018-06-05 DIAGNOSIS — I361 Nonrheumatic tricuspid (valve) insufficiency: Secondary | ICD-10-CM

## 2018-06-05 DIAGNOSIS — I5033 Acute on chronic diastolic (congestive) heart failure: Secondary | ICD-10-CM | POA: Diagnosis not present

## 2018-06-05 DIAGNOSIS — R34 Anuria and oliguria: Secondary | ICD-10-CM | POA: Diagnosis not present

## 2018-06-05 DIAGNOSIS — N179 Acute kidney failure, unspecified: Secondary | ICD-10-CM | POA: Diagnosis present

## 2018-06-05 DIAGNOSIS — G931 Anoxic brain damage, not elsewhere classified: Secondary | ICD-10-CM | POA: Diagnosis not present

## 2018-06-05 DIAGNOSIS — I469 Cardiac arrest, cause unspecified: Secondary | ICD-10-CM | POA: Diagnosis not present

## 2018-06-05 DIAGNOSIS — I2601 Septic pulmonary embolism with acute cor pulmonale: Secondary | ICD-10-CM | POA: Diagnosis not present

## 2018-06-05 DIAGNOSIS — Z66 Do not resuscitate: Secondary | ICD-10-CM

## 2018-06-05 DIAGNOSIS — J9690 Respiratory failure, unspecified, unspecified whether with hypoxia or hypercapnia: Secondary | ICD-10-CM

## 2018-06-05 DIAGNOSIS — E876 Hypokalemia: Secondary | ICD-10-CM | POA: Diagnosis not present

## 2018-06-05 DIAGNOSIS — L89302 Pressure ulcer of unspecified buttock, stage 2: Secondary | ICD-10-CM | POA: Diagnosis not present

## 2018-06-05 DIAGNOSIS — R0600 Dyspnea, unspecified: Secondary | ICD-10-CM

## 2018-06-05 DIAGNOSIS — L89619 Pressure ulcer of right heel, unspecified stage: Secondary | ICD-10-CM | POA: Diagnosis not present

## 2018-06-05 DIAGNOSIS — L8990 Pressure ulcer of unspecified site, unspecified stage: Secondary | ICD-10-CM

## 2018-06-05 DIAGNOSIS — Z6827 Body mass index (BMI) 27.0-27.9, adult: Secondary | ICD-10-CM

## 2018-06-05 DIAGNOSIS — I50810 Right heart failure, unspecified: Secondary | ICD-10-CM | POA: Diagnosis not present

## 2018-06-05 DIAGNOSIS — I96 Gangrene, not elsewhere classified: Secondary | ICD-10-CM | POA: Diagnosis not present

## 2018-06-05 DIAGNOSIS — E874 Mixed disorder of acid-base balance: Secondary | ICD-10-CM | POA: Diagnosis not present

## 2018-06-05 DIAGNOSIS — L89899 Pressure ulcer of other site, unspecified stage: Secondary | ICD-10-CM | POA: Diagnosis not present

## 2018-06-05 DIAGNOSIS — S80822A Blister (nonthermal), left lower leg, initial encounter: Secondary | ICD-10-CM | POA: Diagnosis not present

## 2018-06-05 DIAGNOSIS — N4889 Other specified disorders of penis: Secondary | ICD-10-CM | POA: Diagnosis not present

## 2018-06-05 DIAGNOSIS — Z87891 Personal history of nicotine dependence: Secondary | ICD-10-CM

## 2018-06-05 DIAGNOSIS — E162 Hypoglycemia, unspecified: Secondary | ICD-10-CM | POA: Diagnosis not present

## 2018-06-05 HISTORY — DX: Right heart failure, unspecified: I50.810

## 2018-06-05 HISTORY — DX: Acute kidney failure, unspecified: N17.9

## 2018-06-05 LAB — CBC WITH DIFFERENTIAL/PLATELET
Abs Immature Granulocytes: 0.33 10*3/uL — ABNORMAL HIGH (ref 0.00–0.07)
Basophils Absolute: 0 10*3/uL (ref 0.0–0.1)
Basophils Relative: 0 %
EOS PCT: 3 %
Eosinophils Absolute: 0.2 10*3/uL (ref 0.0–0.5)
HCT: 39.6 % (ref 39.0–52.0)
Hemoglobin: 11.6 g/dL — ABNORMAL LOW (ref 13.0–17.0)
Immature Granulocytes: 4 %
LYMPHS ABS: 5.3 10*3/uL — AB (ref 0.7–4.0)
Lymphocytes Relative: 59 %
MCH: 29.2 pg (ref 26.0–34.0)
MCHC: 29.3 g/dL — ABNORMAL LOW (ref 30.0–36.0)
MCV: 99.7 fL (ref 80.0–100.0)
Monocytes Absolute: 0.5 10*3/uL (ref 0.1–1.0)
Monocytes Relative: 5 %
NRBC: 0.3 % — AB (ref 0.0–0.2)
Neutro Abs: 2.6 10*3/uL (ref 1.7–7.7)
Neutrophils Relative %: 29 %
Platelets: 201 10*3/uL (ref 150–400)
RBC: 3.97 MIL/uL — ABNORMAL LOW (ref 4.22–5.81)
RDW: 14.5 % (ref 11.5–15.5)
WBC: 8.9 10*3/uL (ref 4.0–10.5)

## 2018-06-05 LAB — COMPREHENSIVE METABOLIC PANEL
ALK PHOS: 136 U/L — AB (ref 38–126)
ALT: 141 U/L — AB (ref 0–44)
ALT: 549 U/L — AB (ref 0–44)
AST: 181 U/L — AB (ref 15–41)
AST: 1122 U/L — AB (ref 15–41)
Albumin: 3.2 g/dL — ABNORMAL LOW (ref 3.5–5.0)
Albumin: 2.2 g/dL — ABNORMAL LOW (ref 3.5–5.0)
Alkaline Phosphatase: 138 U/L — ABNORMAL HIGH (ref 38–126)
Anion gap: 23 — ABNORMAL HIGH (ref 5–15)
Anion gap: 16 — ABNORMAL HIGH (ref 5–15)
BUN: 23 mg/dL (ref 8–23)
BUN: 28 mg/dL — ABNORMAL HIGH (ref 8–23)
CALCIUM: 6.2 mg/dL — AB (ref 8.9–10.3)
CHLORIDE: 102 mmol/L (ref 98–111)
CO2: 16 mmol/L — AB (ref 22–32)
CO2: 23 mmol/L (ref 22–32)
CREATININE: 2.57 mg/dL — AB (ref 0.61–1.24)
CREATININE: 3.25 mg/dL — AB (ref 0.61–1.24)
Calcium: 8.8 mg/dL — ABNORMAL LOW (ref 8.9–10.3)
Chloride: 102 mmol/L (ref 98–111)
GFR calc Af Amer: 27 mL/min — ABNORMAL LOW (ref >60)
GFR calc non Af Amer: 24 mL/min — ABNORMAL LOW (ref >60)
GFR calc non Af Amer: 18 mL/min — ABNORMAL LOW (ref >60)
GFR, EST AFRICAN AMERICAN: 21 mL/min — AB (ref >60)
Glucose, Bld: 350 mg/dL — ABNORMAL HIGH (ref 70–99)
Glucose, Bld: 354 mg/dL — ABNORMAL HIGH (ref 70–99)
Potassium: 4.1 mmol/L (ref 3.5–5.1)
Potassium: 2.5 mmol/L — CL (ref 3.5–5.1)
SODIUM: 141 mmol/L (ref 135–145)
SODIUM: 141 mmol/L (ref 135–145)
Total Bilirubin: 0.6 mg/dL (ref 0.3–1.2)
Total Bilirubin: 1.2 mg/dL (ref 0.3–1.2)
Total Protein: 6 g/dL — ABNORMAL LOW (ref 6.5–8.1)
Total Protein: 4 g/dL — ABNORMAL LOW (ref 6.5–8.1)

## 2018-06-05 LAB — GLUCOSE, CAPILLARY
GLUCOSE-CAPILLARY: 356 mg/dL — AB (ref 70–99)
GLUCOSE-CAPILLARY: 295 mg/dL — AB (ref 70–99)
GLUCOSE-CAPILLARY: 360 mg/dL — AB (ref 70–99)
Glucose-Capillary: 276 mg/dL — ABNORMAL HIGH (ref 70–99)
Glucose-Capillary: 313 mg/dL — ABNORMAL HIGH (ref 70–99)
Glucose-Capillary: 324 mg/dL — ABNORMAL HIGH (ref 70–99)
Glucose-Capillary: 336 mg/dL — ABNORMAL HIGH (ref 70–99)
Glucose-Capillary: 346 mg/dL — ABNORMAL HIGH (ref 70–99)
Glucose-Capillary: 354 mg/dL — ABNORMAL HIGH (ref 70–99)
Glucose-Capillary: 359 mg/dL — ABNORMAL HIGH (ref 70–99)
Glucose-Capillary: 373 mg/dL — ABNORMAL HIGH (ref 70–99)

## 2018-06-05 LAB — CBC
HCT: 33.5 % — ABNORMAL LOW (ref 39.0–52.0)
HEMATOCRIT: 29 % — AB (ref 39.0–52.0)
Hemoglobin: 10.9 g/dL — ABNORMAL LOW (ref 13.0–17.0)
Hemoglobin: 9.8 g/dL — ABNORMAL LOW (ref 13.0–17.0)
MCH: 30.2 pg (ref 26.0–34.0)
MCH: 29.5 pg (ref 26.0–34.0)
MCHC: 32.5 g/dL (ref 30.0–36.0)
MCHC: 33.8 g/dL (ref 30.0–36.0)
MCV: 92.8 fL (ref 80.0–100.0)
MCV: 87.3 fL (ref 80.0–100.0)
NRBC: 0.2 % (ref 0.0–0.2)
NRBC: 0.3 % — AB (ref 0.0–0.2)
PLATELETS: 165 10*3/uL (ref 150–400)
PLATELETS: 195 10*3/uL (ref 150–400)
RBC: 3.61 MIL/uL — AB (ref 4.22–5.81)
RBC: 3.32 MIL/uL — ABNORMAL LOW (ref 4.22–5.81)
RDW: 14.6 % (ref 11.5–15.5)
RDW: 14.3 % (ref 11.5–15.5)
WBC: 18.4 10*3/uL — ABNORMAL HIGH (ref 4.0–10.5)
WBC: 23.4 10*3/uL — ABNORMAL HIGH (ref 4.0–10.5)

## 2018-06-05 LAB — BASIC METABOLIC PANEL
Anion gap: 18 — ABNORMAL HIGH (ref 5–15)
BUN: 32 mg/dL — AB (ref 8–23)
CALCIUM: 6.7 mg/dL — AB (ref 8.9–10.3)
CO2: 21 mmol/L — ABNORMAL LOW (ref 22–32)
Chloride: 99 mmol/L (ref 98–111)
Creatinine, Ser: 3.67 mg/dL — ABNORMAL HIGH (ref 0.61–1.24)
GFR calc Af Amer: 18 mL/min — ABNORMAL LOW (ref >60)
GFR, EST NON AFRICAN AMERICAN: 15 mL/min — AB (ref >60)
GLUCOSE: 355 mg/dL — AB (ref 70–99)
Potassium: 2.6 mmol/L — CL (ref 3.5–5.1)
SODIUM: 138 mmol/L (ref 135–145)

## 2018-06-05 LAB — I-STAT TROPONIN, ED: Troponin i, poc: 0.11 ng/mL (ref 0.00–0.08)

## 2018-06-05 LAB — I-STAT CG4 LACTIC ACID, ED: LACTIC ACID, VENOUS: 10.82 mmol/L — AB (ref 0.5–1.9)

## 2018-06-05 LAB — TROPONIN I
TROPONIN I: 24.95 ng/mL — AB (ref <0.03)
Troponin I: 1.98 ng/mL (ref <0.03)

## 2018-06-05 LAB — ECHOCARDIOGRAM COMPLETE
Height: 70 in
Weight: 2190.49 oz

## 2018-06-05 LAB — POCT I-STAT 3, ART BLOOD GAS (G3+)
ACID-BASE DEFICIT: 3 mmol/L — AB (ref 0.0–2.0)
Acid-Base Excess: 1 mmol/L (ref 0.0–2.0)
Acid-base deficit: 3 mmol/L — ABNORMAL HIGH (ref 0.0–2.0)
BICARBONATE: 21.3 mmol/L (ref 20.0–28.0)
Bicarbonate: 25.3 mmol/L (ref 20.0–28.0)
Bicarbonate: 24.1 mmol/L (ref 20.0–28.0)
O2 SAT: 99 %
O2 Saturation: 100 %
O2 Saturation: 95 %
PCO2 ART: 55.9 mmHg — AB (ref 32.0–48.0)
PH ART: 7.241 — AB (ref 7.350–7.450)
PH ART: 7.404 (ref 7.350–7.450)
PO2 ART: 74 mmHg — AB (ref 83.0–108.0)
TCO2: 27 mmol/L (ref 22–32)
TCO2: 22 mmol/L (ref 22–32)
TCO2: 25 mmol/L (ref 22–32)
pCO2 arterial: 34 mmHg (ref 32.0–48.0)
pCO2 arterial: 33.3 mmHg (ref 32.0–48.0)
pH, Arterial: 7.468 — ABNORMAL HIGH (ref 7.350–7.450)
pO2, Arterial: 256 mmHg — ABNORMAL HIGH (ref 83.0–108.0)
pO2, Arterial: 128 mmHg — ABNORMAL HIGH (ref 83.0–108.0)

## 2018-06-05 LAB — I-STAT ARTERIAL BLOOD GAS, ED
ACID-BASE DEFICIT: 18 mmol/L — AB (ref 0.0–2.0)
Acid-base deficit: 10 mmol/L — ABNORMAL HIGH (ref 0.0–2.0)
BICARBONATE: 14.2 mmol/L — AB (ref 20.0–28.0)
Bicarbonate: 20.1 mmol/L (ref 20.0–28.0)
O2 SAT: 100 %
O2 Saturation: 100 %
PCO2 ART: 67.8 mmHg — AB (ref 32.0–48.0)
PH ART: 7.067 — AB (ref 7.350–7.450)
PO2 ART: 368 mmHg — AB (ref 83.0–108.0)
PO2 ART: 254 mmHg — AB (ref 83.0–108.0)
Patient temperature: 95
TCO2: 16 mmol/L — AB (ref 22–32)
TCO2: 22 mmol/L (ref 22–32)
pCO2 arterial: 69.5 mmHg (ref 32.0–48.0)
pH, Arterial: 6.914 — CL (ref 7.350–7.450)

## 2018-06-05 LAB — I-STAT CHEM 8, ED
BUN: 30 mg/dL — ABNORMAL HIGH (ref 8–23)
CALCIUM ION: 1.03 mmol/L — AB (ref 1.15–1.40)
Chloride: 111 mmol/L (ref 98–111)
Creatinine, Ser: 2.2 mg/dL — ABNORMAL HIGH (ref 0.61–1.24)
GLUCOSE: 192 mg/dL — AB (ref 70–99)
HCT: 41 % (ref 39.0–52.0)
HEMOGLOBIN: 13.9 g/dL (ref 13.0–17.0)
POTASSIUM: 4.6 mmol/L (ref 3.5–5.1)
Sodium: 137 mmol/L (ref 135–145)
TCO2: 15 mmol/L — ABNORMAL LOW (ref 22–32)

## 2018-06-05 LAB — PROTIME-INR
INR: 1.56
PROTHROMBIN TIME: 18.5 s — AB (ref 11.4–15.2)

## 2018-06-05 LAB — APTT
APTT: 70 s — AB (ref 24–36)
aPTT: >200 seconds (ref 24–36)
aPTT: 174 seconds (ref 24–36)
aPTT: 125 seconds — ABNORMAL HIGH (ref 24–36)
aPTT: 72 seconds — ABNORMAL HIGH (ref 24–36)

## 2018-06-05 LAB — ETHANOL

## 2018-06-05 LAB — LACTIC ACID, PLASMA
LACTIC ACID, VENOUS: 10 mmol/L — AB (ref 0.5–1.9)
LACTIC ACID, VENOUS: 9.9 mmol/L — AB (ref 0.5–1.9)
Lactic Acid, Venous: 18.2 mmol/L (ref 0.5–1.9)
Lactic Acid, Venous: 12.4 mmol/L (ref 0.5–1.9)

## 2018-06-05 LAB — MRSA PCR SCREENING: MRSA by PCR: NEGATIVE

## 2018-06-05 LAB — PROCALCITONIN: PROCALCITONIN: 0.38 ng/mL

## 2018-06-05 LAB — MAGNESIUM: MAGNESIUM: 3 mg/dL — AB (ref 1.7–2.4)

## 2018-06-05 LAB — ABO/RH: ABO/RH(D): A POS

## 2018-06-05 LAB — PHOSPHORUS: Phosphorus: 11.3 mg/dL — ABNORMAL HIGH (ref 2.5–4.6)

## 2018-06-05 LAB — SALICYLATE LEVEL

## 2018-06-05 LAB — POC OCCULT BLOOD, ED: Fecal Occult Bld: POSITIVE — AB

## 2018-06-05 LAB — ACETAMINOPHEN LEVEL

## 2018-06-05 LAB — BRAIN NATRIURETIC PEPTIDE: B NATRIURETIC PEPTIDE 5: 18 pg/mL (ref 0.0–100.0)

## 2018-06-05 MED ORDER — PIPERACILLIN-TAZOBACTAM 3.375 G IVPB
3.3750 g | Freq: Two times a day (BID) | INTRAVENOUS | Status: DC
  Administered 2018-06-05 – 2018-06-08 (×6): 3.375 g via INTRAVENOUS
  Filled 2018-06-05 (×5): qty 50

## 2018-06-05 MED ORDER — MIDAZOLAM HCL 2 MG/2ML IJ SOLN
1.0000 mg | INTRAMUSCULAR | Status: DC | PRN
  Administered 2018-06-05 – 2018-06-16 (×39): 2 mg via INTRAVENOUS
  Filled 2018-06-05 (×38): qty 2

## 2018-06-05 MED ORDER — SODIUM CHLORIDE 0.9 % IV SOLN
INTRAVENOUS | Status: AC | PRN
  Administered 2018-06-05: 1000 mL via INTRAVENOUS

## 2018-06-05 MED ORDER — FENTANYL 2500MCG IN NS 250ML (10MCG/ML) PREMIX INFUSION
0.0000 ug/h | INTRAVENOUS | Status: DC
  Administered 2018-06-05: 50 ug/h via INTRAVENOUS
  Administered 2018-06-06: 150 ug/h via INTRAVENOUS
  Administered 2018-06-07: 50 ug/h via INTRAVENOUS
  Administered 2018-06-09: 100 ug/h via INTRAVENOUS
  Administered 2018-06-10 – 2018-06-11 (×4): 200 ug/h via INTRAVENOUS
  Administered 2018-06-12 (×2): 250 ug/h via INTRAVENOUS
  Administered 2018-06-12: 200 ug/h via INTRAVENOUS
  Administered 2018-06-13: 125 ug/h via INTRAVENOUS
  Administered 2018-06-14: 300 ug/h via INTRAVENOUS
  Administered 2018-06-14: 175 ug/h via INTRAVENOUS
  Administered 2018-06-15 (×2): 200 ug/h via INTRAVENOUS
  Administered 2018-06-16: 50 ug/h via INTRAVENOUS
  Administered 2018-06-17: 75 ug/h via INTRAVENOUS
  Administered 2018-06-18: 100 ug/h via INTRAVENOUS
  Administered 2018-06-19 (×2): 400 ug/h via INTRAVENOUS
  Administered 2018-06-20: 300 ug/h via INTRAVENOUS
  Administered 2018-06-20 – 2018-06-21 (×3): 400 ug/h via INTRAVENOUS
  Administered 2018-06-22: 50 ug/h via INTRAVENOUS
  Administered 2018-06-22 – 2018-06-23 (×2): 200 ug/h via INTRAVENOUS
  Filled 2018-06-05 (×29): qty 250

## 2018-06-05 MED ORDER — SODIUM BICARBONATE 8.4 % IV SOLN
INTRAVENOUS | Status: AC | PRN
  Administered 2018-06-05: 50 meq via INTRAVENOUS

## 2018-06-05 MED ORDER — CHLORHEXIDINE GLUCONATE CLOTH 2 % EX PADS
6.0000 | MEDICATED_PAD | Freq: Every day | CUTANEOUS | Status: DC
  Administered 2018-06-05 – 2018-06-09 (×4): 6 via TOPICAL

## 2018-06-05 MED ORDER — EPINEPHRINE PF 1 MG/ML IJ SOLN
0.5000 ug/min | INTRAVENOUS | Status: AC
  Administered 2018-06-05: 5 ug/min via INTRAVENOUS
  Filled 2018-06-05: qty 4

## 2018-06-05 MED ORDER — EPINEPHRINE PF 1 MG/10ML IJ SOSY
PREFILLED_SYRINGE | INTRAMUSCULAR | Status: AC | PRN
  Administered 2018-06-05: 1 mg via INTRAVENOUS

## 2018-06-05 MED ORDER — TENECTEPLASE 50 MG IV KIT
35.0000 mg | PACK | INTRAVENOUS | Status: AC
  Administered 2018-06-05: 35 mg via INTRAVENOUS
  Filled 2018-06-05: qty 10

## 2018-06-05 MED ORDER — VANCOMYCIN HCL IN DEXTROSE 750-5 MG/150ML-% IV SOLN
750.0000 mg | INTRAVENOUS | Status: DC
  Administered 2018-06-05 – 2018-06-07 (×3): 750 mg via INTRAVENOUS
  Filled 2018-06-05 (×4): qty 150

## 2018-06-05 MED ORDER — SODIUM CHLORIDE 0.9 % IV SOLN
INTRAVENOUS | Status: DC
  Administered 2018-06-06: 20:00:00 via INTRAVENOUS
  Administered 2018-06-07: 1000 mL via INTRAVENOUS

## 2018-06-05 MED ORDER — DOBUTAMINE IN D5W 4-5 MG/ML-% IV SOLN
3.0000 ug/kg/min | INTRAVENOUS | Status: DC
  Administered 2018-06-05: 2.5 ug/kg/min via INTRAVENOUS
  Administered 2018-06-06 – 2018-06-10 (×9): 20 ug/kg/min via INTRAVENOUS
  Administered 2018-06-11 – 2018-06-12 (×2): 10 ug/kg/min via INTRAVENOUS
  Administered 2018-06-13: 6 ug/kg/min via INTRAVENOUS
  Filled 2018-06-05 (×13): qty 250

## 2018-06-05 MED ORDER — SODIUM BICARBONATE 8.4 % IV SOLN
INTRAVENOUS | Status: AC
  Filled 2018-06-05: qty 100

## 2018-06-05 MED ORDER — SODIUM BICARBONATE 8.4 % IV SOLN
INTRAVENOUS | Status: AC | PRN
  Administered 2018-06-05 (×2): 50 meq via INTRAVENOUS

## 2018-06-05 MED ORDER — NOREPINEPHRINE 4 MG/250ML-% IV SOLN: 0.0000 ug/min | INTRAVENOUS | Status: DC

## 2018-06-05 MED ORDER — CHLORHEXIDINE GLUCONATE 0.12% ORAL RINSE (MEDLINE KIT)
15.0000 mL | Freq: Two times a day (BID) | OROMUCOSAL | Status: DC
  Administered 2018-06-05 – 2018-06-28 (×48): 15 mL via OROMUCOSAL

## 2018-06-05 MED ORDER — SODIUM BICARBONATE 8.4 % IV SOLN
INTRAVENOUS | Status: AC
  Administered 2018-06-05: 05:00:00 via INTRAVENOUS
  Filled 2018-06-05: qty 850

## 2018-06-05 MED ORDER — NOREPINEPHRINE 4 MG/250ML-% IV SOLN
INTRAVENOUS | Status: AC
  Filled 2018-06-05: qty 250

## 2018-06-05 MED ORDER — SODIUM CHLORIDE 0.9% FLUSH: 10.0000 mL | INTRAVENOUS | Status: DC | PRN

## 2018-06-05 MED ORDER — ALTEPLASE (PULMONARY EMBOLISM) INFUSION: 100.0000 mg | Freq: Once | INTRAVENOUS | Status: DC

## 2018-06-05 MED ORDER — INSULIN REGULAR(HUMAN) IN NACL 100-0.9 UT/100ML-% IV SOLN
INTRAVENOUS | Status: DC
  Administered 2018-06-05: 100 [IU] via INTRAVENOUS
  Administered 2018-06-05: 3.1 [IU] via INTRAVENOUS
  Administered 2018-06-05 – 2018-06-06 (×3): 100 [IU] via INTRAVENOUS
  Filled 2018-06-05 (×6): qty 100

## 2018-06-05 MED ORDER — "THROMBI-PAD 3""X3"" EX PADS"
1.0000 | MEDICATED_PAD | Freq: Once | CUTANEOUS | Status: DC
  Filled 2018-06-05 (×2): qty 1

## 2018-06-05 MED ORDER — EPINEPHRINE PF 1 MG/10ML IJ SOSY
PREFILLED_SYRINGE | INTRAMUSCULAR | Status: AC | PRN
  Administered 2018-06-05 (×2): 1 mg via INTRAVENOUS

## 2018-06-05 MED ORDER — SODIUM CHLORIDE 0.9 % IV BOLUS: 1000.0000 mL | Freq: Once | INTRAVENOUS | Status: DC

## 2018-06-05 MED ORDER — POTASSIUM CHLORIDE 10 MEQ/50ML IV SOLN
10.0000 meq | INTRAVENOUS | Status: AC
  Administered 2018-06-05 (×6): 10 meq via INTRAVENOUS
  Filled 2018-06-05 (×5): qty 50

## 2018-06-05 MED ORDER — NOREPINEPHRINE 4 MG/250ML-% IV SOLN
0.0000 ug/min | Freq: Once | INTRAVENOUS | Status: AC
  Administered 2018-06-05: 5 ug/min via INTRAVENOUS

## 2018-06-05 MED ORDER — VASOPRESSIN 20 UNIT/ML IV SOLN
0.0300 [IU]/min | INTRAVENOUS | Status: DC
  Administered 2018-06-05 – 2018-06-11 (×8): 0.03 [IU]/min via INTRAVENOUS
  Filled 2018-06-05 (×9): qty 2

## 2018-06-05 MED ORDER — FENTANYL CITRATE (PF) 100 MCG/2ML IJ SOLN
50.0000 ug | INTRAMUSCULAR | Status: AC | PRN
  Administered 2018-06-05 – 2018-06-08 (×3): 50 ug via INTRAVENOUS
  Filled 2018-06-05 (×3): qty 2

## 2018-06-05 MED ORDER — SODIUM BICARBONATE 8.4 % IV SOLN
INTRAVENOUS | Status: AC | PRN
  Administered 2018-06-05 (×3): 50 meq via INTRAVENOUS

## 2018-06-05 MED ORDER — FAMOTIDINE IN NACL 20-0.9 MG/50ML-% IV SOLN
20.0000 mg | Freq: Two times a day (BID) | INTRAVENOUS | Status: DC
  Administered 2018-06-05 – 2018-06-07 (×5): 20 mg via INTRAVENOUS
  Filled 2018-06-05 (×5): qty 50

## 2018-06-05 MED ORDER — CHLORHEXIDINE GLUCONATE 0.12 % MT SOLN
OROMUCOSAL | Status: AC
  Administered 2018-06-05: 15 mL via OROMUCOSAL
  Filled 2018-06-05: qty 15

## 2018-06-05 MED ORDER — SODIUM CHLORIDE 0.9 % IV SOLN
80.0000 mg | Freq: Once | INTRAVENOUS | Status: DC
  Filled 2018-06-05: qty 80

## 2018-06-05 MED ORDER — SODIUM CHLORIDE 0.9 % IV SOLN
8.0000 mg/h | INTRAVENOUS | Status: DC
  Administered 2018-06-05: 8 mg/h via INTRAVENOUS
  Filled 2018-06-05: qty 80

## 2018-06-05 MED ORDER — INSULIN ASPART 100 UNIT/ML ~~LOC~~ SOLN: 0.0000 [IU] | SUBCUTANEOUS | Status: DC

## 2018-06-05 MED ORDER — CALCIUM GLUCONATE-NACL 2-0.675 GM/100ML-% IV SOLN
2.0000 g | Freq: Once | INTRAVENOUS | Status: AC
  Administered 2018-06-05: 2000 mg via INTRAVENOUS
  Filled 2018-06-05: qty 100

## 2018-06-05 MED ORDER — SODIUM CHLORIDE 0.9% FLUSH
10.0000 mL | Freq: Two times a day (BID) | INTRAVENOUS | Status: DC
  Administered 2018-06-05 – 2018-06-28 (×39): 10 mL

## 2018-06-05 MED ORDER — EPINEPHRINE PF 1 MG/ML IJ SOLN
0.5000 ug/min | INTRAVENOUS | Status: DC
  Administered 2018-06-05: 9 ug/min via INTRAVENOUS
  Administered 2018-06-05 – 2018-06-06 (×4): 20 ug/min via INTRAVENOUS
  Filled 2018-06-05 (×6): qty 4

## 2018-06-05 MED ORDER — SODIUM CHLORIDE 0.9 % IV BOLUS
1000.0000 mL | Freq: Once | INTRAVENOUS | Status: AC
  Administered 2018-06-05: 1000 mL via INTRAVENOUS

## 2018-06-05 MED ORDER — SODIUM CHLORIDE 0.9 % IV SOLN: 250.0000 mL | Freq: Once | INTRAVENOUS | Status: DC

## 2018-06-05 MED ORDER — ORAL CARE MOUTH RINSE
15.0000 mL | OROMUCOSAL | Status: DC
  Administered 2018-06-05 – 2018-06-29 (×232): 15 mL via OROMUCOSAL

## 2018-06-05 MED ORDER — FENTANYL CITRATE (PF) 100 MCG/2ML IJ SOLN
50.0000 ug | INTRAMUSCULAR | Status: DC | PRN
  Administered 2018-06-05 (×3): 25 ug via INTRAVENOUS
  Administered 2018-06-08 – 2018-06-27 (×28): 50 ug via INTRAVENOUS
  Filled 2018-06-05 (×15): qty 2

## 2018-06-05 MED ORDER — STERILE WATER FOR INJECTION IV SOLN
INTRAVENOUS | Status: DC
  Filled 2018-06-05 (×4): qty 850

## 2018-06-05 MED ORDER — NOREPINEPHRINE 16 MG/250ML-% IV SOLN
0.0000 ug/min | INTRAVENOUS | Status: DC
  Administered 2018-06-05: 30 ug/min via INTRAVENOUS
  Administered 2018-06-05 – 2018-06-06 (×2): 50 ug/min via INTRAVENOUS
  Administered 2018-06-06: 40 ug/min via INTRAVENOUS
  Filled 2018-06-05 (×8): qty 250

## 2018-06-05 MED ORDER — HEPARIN (PORCINE) IN NACL 100-0.45 UNIT/ML-% IJ SOLN
800.0000 [IU]/h | INTRAMUSCULAR | Status: DC
  Administered 2018-06-05: 1000 [IU]/h via INTRAVENOUS
  Administered 2018-06-06: 800 [IU]/h via INTRAVENOUS
  Filled 2018-06-05 (×2): qty 250

## 2018-06-05 NOTE — Progress Notes (Signed)
Critical abg values RBV by Jovita Kussmaul, NP at 0800 on 05/21/2018 by Gertie Fey, RRT, no changes to be made at this time.

## 2018-06-05 NOTE — Code Documentation (Signed)
Ice packs applied to cool pt.

## 2018-06-05 NOTE — Code Documentation (Signed)
Pulse check, NSR

## 2018-06-05 NOTE — Consult Note (Signed)
Cardiology Consultation:   Patient ID: Bryan Parker MRN: [id number]; DOB: May 30, 1946  Admit date: 06/09/2018 Date of Consult: 06/04/2018  Primary Care Provider: Card, Elwyn Lade, MD Primary Cardiologist: Dr Radford Pax   Patient Profile:   Bryan Parker is a 72 y.o. male with a hx of hypertension, large pulmonary embolism treated by thrombolytics who is being seen today for the evaluation of biventricular failure at the request of Marshell Garfinkel, MD.  History of Present Illness:   Bryan Parker is a 72yo AAM with a history of HTN and family history of premature CAD (brother died of MI in his 30's) and remote tobacco use was hospitalized in 2016 with chest pain and shortness of breath  He has a remote history of tobacco use smoking 1/2ppd for 40 years and quit 6 years ago.  He had mildly elevated troponin.  His chest pain felt to be atypical with nonischemic EKG, and pain was considered to be musculoskeletal, elevated troponin attributed to demand ischemia from upper respiratory infection. The patient also has history of pulmonary embolism, DVT, in the past, he has been off anticoagulation for over a year.  Also history of chronic kidney disease. The patient presented to [facility] ER this morning after a PEA cardiac arrest with at least 10 minutes downtime, this was followed by multiple subsequent arrest in the ED, he was given thrombolytic for suspected pulmonary embolism.  Hypothermia was not started secondary to hemodynamic instability and low blood pressure.  He was started on epinephrine, norepinephrine, vasopressin, dobutamine, he is being sedated with fentanyl, and is getting hydration with 125 cc of bicarb per hour.  Patient is alert awake and seems to be responding to questions. Central line was placed, his initial CVP was 17.  Past Medical History:  Diagnosis Date  . Hypertension   . Vitamin D deficiency     Past Surgical History:  Procedure Laterality Date  . PROSTATE SURGERY    .  SPINE SURGERY      Inpatient Medications: Scheduled Meds: . chlorhexidine gluconate (MEDLINE KIT)  15 mL Mouth Rinse BID  . Chlorhexidine Gluconate Cloth  6 each Topical Daily  . mouth rinse  15 mL Mouth Rinse 10 times per day  . sodium bicarbonate      . sodium chloride flush  10-40 mL Intracatheter Q12H  . THROMBI-PAD  1 each Topical Once   Continuous Infusions: . sodium chloride    . sodium chloride    . DOBUTamine 5 mcg/kg/min (06/11/2018 1310)  . epinephrine 9 mcg/min (06/10/2018 1250)  . famotidine (PEPCID) IV    . fentaNYL infusion INTRAVENOUS 100 mcg/hr (06/16/2018 1307)  . insulin    . norepinephrine (LEVOPHED) Adult infusion 30 mcg/min (05/23/2018 1200)  .  sodium bicarbonate (isotonic) infusion in sterile water 125 mL/hr at 06/10/2018 1200  . sodium chloride    . vasopressin (PITRESSIN) infusion - *FOR SHOCK* 0.03 Units/min (06/15/2018 1200)   PRN Meds: fentaNYL (SUBLIMAZE) injection, fentaNYL (SUBLIMAZE) injection, sodium chloride flush  Allergies:   No Known Allergies  Social History:   Social History   Socioeconomic History  . Marital status: Married    Spouse name: Not on file  . Number of children: Not on file  . Years of education: Not on file  . Highest education level: Not on file  Occupational History  . Not on file  Social Needs  . Financial resource strain: Not on file  . Food insecurity:    Worry: Not on file  Inability: Not on file  . Transportation needs:    Medical: Not on file    Non-medical: Not on file  Tobacco Use  . Smoking status: Former Smoker  Substance and Sexual Activity  . Alcohol use: No  . Drug use: No  . Sexual activity: Not on file  Lifestyle  . Physical activity:    Days per week: Not on file    Minutes per session: Not on file  . Stress: Not on file  Relationships  . Social connections:    Talks on phone: Not on file    Gets together: Not on file    Attends religious service: Not on file    Active member of club or  organization: Not on file    Attends meetings of clubs or organizations: Not on file    Relationship status: Not on file  . Intimate partner violence:    Fear of current or ex partner: Not on file    Emotionally abused: Not on file    Physically abused: Not on file    Forced sexual activity: Not on file  Other Topics Concern  . Not on file  Social History Narrative  . Not on file    Family History:   Family History  Problem Relation Age of Onset  . Deep vein thrombosis Sister   . Pulmonary embolism Sister   . Heart attack Brother 31  . Heart disease Brother     ROS:  Please see the history of present illness.  All other ROS reviewed and negative.     Physical Exam/Data:   Vitals:   06/10/2018 1100 06/07/2018 1200 05/27/2018 1258 06/02/2018 1300  BP: (!) 64/50 108/87  99/79  Pulse:  (!) 114  (!) 119  Resp: (!) 35 (!) 24  (!) 24  Temp: (!) 93.2 F (34 C) (!) 93.7 F (34.3 C)  (!) 95.2 F (35.1 C)  TempSrc:      SpO2:  93% 97%   Weight:      Height:        Intake/Output Summary (Last 24 hours) at 05/24/2018 1321 Last data filed at 06/01/2018 1200 Gross per 24 hour  Intake 960.86 ml  Output 165 ml  Net 795.86 ml   Filed Weights   06/08/2018 0604 06/12/2018 0800  Weight: 62.1 kg 66.5 kg   Body mass index is 21.04 kg/m.  General: Intubated sedated, alert Neck: +4 bilaterally JVD Endocrine:  No thryomegaly Vascular: No carotid bruits; FA pulses 2+ bilaterally without bruits  Cardiac:  S1, S2; S4, iRRR; no murmur  Lungs:  clear to auscultation bilaterally, no wheezing, rhonchi or rales  Abd: soft, nontender, no hepatomegaly  Ext: no edema, poor peripheral pulses Skin: warm and dry   EKG:  The EKG was personally reviewed and demonstrates: Atrial fibrillation with RVR, nonspecific interventricular conduction delay, previously sinus rhythm with narrow QRS and Q waves in the anterior leads.  This was personally reviewed. Telemetry:  Telemetry was personally reviewed and  demonstrates: Atrial fibrillation with RVR and ventricular rates 120 bpm  Relevant CV Studies:  TTE: 05/28/2018   - Left ventricle: The cavity size was normal. Wall thickness was   normal. Systolic function was normal. The estimated ejection   fraction was in the range of 55% to 60%. The study is not   technically sufficient to allow evaluation of LV diastolic   function. - Right ventricle: The cavity size was severely dilated. - Right atrium: The atrium was moderately dilated. -  Tricuspid valve: There was moderate regurgitation. - Pulmonary arteries: PA peak pressure: 31 mm Hg (S). - Impressions: Should make sure PE r/o given primary finding of   severe RV dilatation and failure Impressions: - Should make sure PE r/o given primary finding of severe RV   dilatation and failure  Laboratory Data:  Chemistry Recent Labs  Lab 05/17/2018 0439 06/06/2018 0442  NA 141 137  K 4.1 4.6  CL 102 111  CO2 16*  --   GLUCOSE 350* 192*  BUN 23 30*  CREATININE 2.57* 2.20*  CALCIUM 8.8*  --   GFRNONAA 24*  --   GFRAA 27*  --   ANIONGAP 23*  --     Recent Labs  Lab 05/26/2018 0439  PROT 6.0*  ALBUMIN 3.2*  AST 181*  ALT 141*  ALKPHOS 138*  BILITOT 0.6   Hematology Recent Labs  Lab 05/18/2018 0439 05/28/2018 0442 05/31/2018 0854  WBC 8.9  --  18.4*  RBC 3.97*  --  3.61*  HGB 11.6* 13.9 10.9*  HCT 39.6 41.0 33.5*  MCV 99.7  --  92.8  MCH 29.2  --  30.2  MCHC 29.3*  --  32.5  RDW 14.5  --  14.6  PLT 201  --  165   Cardiac Enzymes Recent Labs  Lab 06/11/2018 0854  TROPONINI 1.98*    Recent Labs  Lab 06/06/2018 0443  TROPIPOC 0.11*    BNP Recent Labs  Lab 06/04/2018 0854  BNP 18.0    DDimer No results for input(s): DDIMER in the last 168 hours.  Radiology/Studies:  Ct Head Wo Contrast  Result Date: 05/27/2018 CLINICAL DATA:  Altered level of consciousness. Status post cardiac arrest. EXAM: CT HEAD WITHOUT CONTRAST TECHNIQUE: Contiguous axial images were obtained from  the base of the skull through the vertex without intravenous contrast. COMPARISON:  None. FINDINGS: Brain: No acute infarct, hemorrhage, or mass lesion is present. Minimal white matter changes are within normal limits for age. Basal ganglia are intact. Insert pass ventricles No significant extraaxial fluid collection is present. The brainstem and cerebellum are within normal limits. Vascular: Atherosclerotic calcifications are present within the cavernous internal carotid arteries bilaterally. There is no hyperdense vessel. Skull: Calvarium is intact. No focal lytic or blastic lesions are present. Sinuses/Orbits: There is mild mucosal thickening along the floor of the maxillary sinuses bilaterally. The remaining paranasal sinuses and the mastoid air cells are clear. Globes and orbits are within normal limits. IMPRESSION: Normal CT of the head for age. Electronically Signed   By: San Morelle M.D.   On: 05/17/2018 08:12   Dg Chest Port 1 View  Result Date: 06/10/2018 CLINICAL DATA:  Intubation after CPR EXAM: PORTABLE CHEST 1 VIEW COMPARISON:  Chest radiograph 10/20/2014 FINDINGS: Endotracheal tube tip is just below the clavicular heads approximately 2 cm above the inferior margin of the carina. The enteric tube tip and side port are below the field of view. The lungs are clear. Normal cardiomediastinum. IMPRESSION: 1. Endotracheal tube tip 2 cm above the inferior carina. 2. Clear lungs. Electronically Signed   By: Ulyses Jarred M.D.   On: 05/27/2018 05:38   CTA in March 2016 IMPRESSION: Positive for acute PE with CT evidence of right heart strain (RV/LV Ratio = 1.4) consistent with at least submassive (intermediate risk) PE. The presence of right heart strain has been associated with an increased risk of morbidity and mortality.  There is a degree of underlying centrilobular emphysema. Mild bibasilar atelectasis. No edema  or consolidation. No adenopathy.  Critical Value/emergent results  were called by telephone at the time of interpretation on 10/16/2014 at 6:47 pm to [facility] , who verbally acknowledged these results.  Echocardiogram in March 2016 - Left ventricle: The cavity size was normal. Wall thickness was increased in a pattern of mild LVH. The estimated ejection fraction was 60%. Wall motion was normal; there were no regional wall motion abnormalities. - Aortic valve: There was trivial regurgitation. - Right ventricle: Despite severe pulmonary hypertension, RV size and function are normal. The cavity size was normal. Systolic function was normal. - Tricuspid valve: There was moderate regurgitation. - Pulmonary arteries: PA peak pressure: 73 mm Hg (S).   Assessment and Plan:   1.  Presumed large pulmonary embolism treated with thrombolytics, with new severe ventricular dilatation and dysfunction, the patient has history of prior pulmonary embolism in March 2016 however at the time RV size and function was normal. Patient is currently on 4 pressors including levophed, epinephrine, vasopressin and inotropic support of dobutamine. The patient was also started on nitric oxide currently at 20, CVP decreased from 17 this morning to 4.  Blood pressure has improved from 40->981 systolic.  I would continue current management, including IV fluids at 125 mL of bicarbs per hour.  We will monitor for fluid overload.  Patient is tachycardic however compensating for RV failure, I would not correct this at this time.  Patient's mentation seems to be well preserved, this first 24 hours will be critical, we will follow the patient.  For questions or updates, please contact [place] Please consult [email or web address] for contact info under     Signed, Ena Dawley, MD  05/28/2018 1:21 PM

## 2018-06-05 NOTE — Progress Notes (Signed)
Patient beginning to develop low grade fever, related to potential aspiration pneumonia as result of the cardiac arrest. Cultures already pending. Will start Vanc and Zosyn. Continue trending lactate and procal.   Worsening cardiogenic shock due to RV failure, likely from the PE. He is preload dependent in this setting so may need more IVF's. Since Pox is good, feel he can likely tolerate a bolus at this time. Will give 1L NS bolus and re-eval response.    Has developed AKI and was hypokalemic and hypocalcemic this afternoon. Will recheck BMP now.   Trop 1.98 this AM and has not been rechecked and has not yet peaked. Will continue to trend it.   Regarding the PE itself, PTT's are trending down so will plan to start Heparin gtt per protocol.

## 2018-06-05 NOTE — ED Provider Notes (Addendum)
[facility] EMERGENCY DEPARTMENT Provider Note   CSN: [id number] Arrival date & time: 05/22/2018  0431     History   Chief Complaint Chief Complaint  Patient presents with  . Cardiac Arrest    HPI Bryan Parker is a 72 y.o. male.  HPI  Level 5 caveat for altered mental status and unresponsive patient.  72 year old male with history of hypertension comes in after cardiac arrest. According to EMS, when they arrived to patient's home he had chest arrested.  Patient received 2 rounds of epinephrine along with CPR and they had a ROSC.  Patient went into a PEA arrest again en route but arrived to the ER with a pulse.  I spoke with patient's wife.  She reported that patient woke up in the middle the night and had diarrhea and vomiting.  Soon after he started feeling unwell and told her that he felt like he was dying.  When fire fighters arrived, patient had a blank stare and then he collapsed.  Patient had not been complaining of any chest pain, shortness of breath and he had been feeling well.  There were no recent infection.  Patient does not drink heavily and occasionally uses marijuana.   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  Patient is not on any blood thinners at this time.  He had been complaining of ankle tightness and calf pain yesterday.  He is full code.  Past Medical History:  Diagnosis Date  . Hypertension   . Vitamin D deficiency     Patient Active Problem List   Diagnosis Date Noted  . Cardiac arrest (HCC) 05/17/2018  . CKD (chronic kidney disease) 10/23/2014  . Acute deep vein thrombosis (DVT) of popliteal vein of right lower extremity (HCC) 10/22/2014  . HCAP (healthcare-associated pneumonia)   . Chest pain 10/16/2014  . URI (upper respiratory infection) 10/16/2014  . Tachycardia 10/16/2014  . Elevated troponin 10/16/2014  . HTN (hypertension) 10/16/2014  . PE (pulmonary embolism) 10/16/2014  . Vitamin  D deficiency     Past Surgical History:  Procedure Laterality Date  . PROSTATE SURGERY    . SPINE SURGERY          Home Medications    Prior to Admission medications   Medication Sig Start Date End Date Taking? Authorizing Provider  DiphenhydrAMINE HCl (ALKA-SELTZER PLUS ALLERGY PO) Take 2 each by mouth daily as needed (cold symptoms). Dissolve 2 tablets in water    [provider]  hydrochlorothiazide (HYDRODIURIL) 12.5 MG tablet Take 12.5 mg by mouth daily.    [provider]  naproxen sodium (ANAPROX) 220 MG tablet Take 440 mg by mouth at bedtime as needed (for wrist pain).    [provider]  oxyCODONE-acetaminophen (PERCOCET/ROXICET) 5-325 MG per tablet Take 1-2 tablets by mouth every 3 (three) hours as needed for moderate pain. 10/23/14   Rodolph Bong, MD  polyethylene glycol [facility] E[facility]lene Hal) packet Take 17 g by mouth daily.    [provider]  Rivaroxaban (XARELTO STARTER PACK) 15 & 20 MG TBPK Take as directed on package: Start with one 15mg  tablet by mouth twice a day with food. On Day 22, switch to one 20mg  tablet once a day with food. Take 15 mg 2 times daily through 11/09/14, then 20 mg daily starting 11/10/14. 10/23/14   Rodolph Bong, MD  rivaroxaban (XARELTO) 20 MG TABS tablet Take 1 tablet (20 mg total) by mouth daily  with supper. 11/10/14   Rodolph Bong, MD  Vitamin D, Ergocalciferol, (DRISDOL) 50000 UNITS CAPS capsule Take 50,000 Units by mouth every 7 (seven) days.    [provider]    Family History Family History  Problem Relation Age of Onset  . Deep vein thrombosis Sister   . Pulmonary embolism Sister   . Heart attack Brother 50  . Heart disease Brother     Social History Social History   Tobacco Use  . Smoking status: Former Smoker  Substance Use Topics  . Alcohol use: No  . Drug use: No     Allergies   Patient has no known allergies.   Review of Systems Review of Systems  Unable to  perform ROS: Patient unresponsive     Physical Exam Updated Vital Signs BP (!) 101/57   Pulse (!) 111   Temp (!) 93 F (33.9 C)   Resp (!) 25   Ht 5\' 10"  (1.778 m)   Wt 62.1 kg   SpO2 100%   BMI 19.64 kg/m   Physical Exam  Constitutional: He appears well-developed.  HENT:  Head: Normocephalic and atraumatic.  Eyes: Pupils are equal, round, and reactive to light.  Cardiovascular:  Faint pulse at arrival  Nursing note and vitals reviewed.    ED Treatments / Results  Labs (all labs ordered are listed, but only abnormal results are displayed) Labs Reviewed  COMPREHENSIVE METABOLIC PANEL - Abnormal; Notable for the following components:      Result Value   CO2 16 (*)    Glucose, Bld 350 (*)    Creatinine, Ser 2.57 (*)    Calcium 8.8 (*)    Total Protein 6.0 (*)    Albumin 3.2 (*)    AST 181 (*)    ALT 141 (*)    Alkaline Phosphatase 138 (*)    GFR calc non Af Amer 24 (*)    GFR calc Af Amer 27 (*)    Anion gap 23 (*)    All other components within normal limits  CBC WITH DIFFERENTIAL/PLATELET - Abnormal; Notable for the following components:   RBC 3.97 (*)    Hemoglobin 11.6 (*)    MCHC 29.3 (*)    nRBC 0.3 (*)    Lymphs Abs 5.3 (*)    Abs Immature Granulocytes 0.33 (*)    All other components within normal limits  MAGNESIUM - Abnormal; Notable for the following components:   Magnesium 3.0 (*)    All other components within normal limits  PHOSPHORUS - Abnormal; Notable for the following components:   Phosphorus 11.3 (*)    All other components within normal limits  PROTIME-INR - Abnormal; Notable for the following components:   Prothrombin Time 18.5 (*)    All other components within normal limits  APTT - Abnormal; Notable for the following components:   aPTT 70 (*)    All other components within normal limits  I-STAT TROPONIN, ED - Abnormal; Notable for the following components:   Troponin i, poc 0.11 (*)    All other components within normal limits    I-STAT CG4 LACTIC ACID, ED - Abnormal; Notable for the following components:   Lactic Acid, Venous 10.82 (*)    All other components within normal limits  I-STAT CHEM 8, ED - Abnormal; Notable for the following components:   BUN 30 (*)    Creatinine, Ser 2.20 (*)    Glucose, Bld 192 (*)    Calcium, Ion 1.03 (*)  TCO2 15 (*)    All other components within normal limits  I-STAT ARTERIAL BLOOD GAS, ED - Abnormal; Notable for the following components:   pH, Arterial 6.914 (*)    pCO2 arterial 69.5 (*)    pO2, Arterial 368.0 (*)    Bicarbonate 14.2 (*)    TCO2 16 (*)    Acid-base deficit 18.0 (*)    All other components within normal limits  POC OCCULT BLOOD, ED - Abnormal; Notable for the following components:   Fecal Occult Bld POSITIVE (*)    All other components within normal limits  I-STAT ARTERIAL BLOOD GAS, ED - Abnormal; Notable for the following components:   pH, Arterial 7.067 (*)    pCO2 arterial 67.8 (*)    pO2, Arterial 254.0 (*)    Acid-base deficit 10.0 (*)    All other components within normal limits  CULTURE, RESPIRATORY  CULTURE, BLOOD (ROUTINE X 2)  CULTURE, BLOOD (ROUTINE X 2)  RAPID URINE DRUG SCREEN, HOSP PERFORMED  APTT  PROTIME-INR  CBC  LACTIC ACID, PLASMA  LACTIC ACID, PLASMA  TROPONIN I  TROPONIN I  TROPONIN I  PROCALCITONIN  URINALYSIS, ROUTINE W REFLEX MICROSCOPIC  APTT  PROTIME-INR  CBC  BLOOD GAS, ARTERIAL  I-STAT TROPONIN, ED  TYPE AND SCREEN  ABO/RH    EKG EKG Interpretation  Date/Time:  Sunday 22-Jun-2018 04:36:40 EDT Ventricular Rate:  98 PR Interval:    QRS Duration: 153 QT Interval:  368 QTC Calculation: 446 R Axis:   176 Text Interpretation:  Atrial fibrillation vs sinus arrhythmia Right bundle branch block ST depr, consider ischemia, inferior leads Premature ventricular complexes changes noted from prior ekg Nonspecific ST and T wave abnormality Confirmed by Derwood Kaplan [phone]) on 22-Jun-2018 7:01:03 AM    EKG  Interpretation  Date/Time:  Sunday 2018/06/22 05:12:27 EDT Ventricular Rate:  140 PR Interval:    QRS Duration: 128 QT Interval:  302 QTC Calculation: 461 R Axis:   151 Text Interpretation:  Wide-QRS tachycardia Nonspecific intraventricular conduction delay Confirmed by Derwood Kaplan [phone]) on 2018/06/22 7:23:10 AM         Radiology Dg Chest Port 1 View  Result Date: 06-22-18 CLINICAL DATA:  Intubation after CPR EXAM: PORTABLE CHEST 1 VIEW COMPARISON:  Chest radiograph 10/20/2014 FINDINGS: Endotracheal tube tip is just below the clavicular heads approximately 2 cm above the inferior margin of the carina. The enteric tube tip and side port are below the field of view. The lungs are clear. Normal cardiomediastinum. IMPRESSION: 1. Endotracheal tube tip 2 cm above the inferior carina. 2. Clear lungs. Electronically Signed   By: Deatra Robinson M.D.   On: 2018/06/22 05:38    Procedures  1. CPR 2. Arterial line 3. Critical care 4.  Point-of-care bedside ultrasounds  .Critical Care Performed by: Derwood Kaplan, MD Authorized by: Derwood Kaplan, MD   Critical care provider statement:    Critical care time (minutes):  55   Critical care start time:  June 22, 2018 4:30 AM   Critical care end time:  06/22/2018 7:01 AM   Critical care time was exclusive of:  Separately billable procedures and treating other patients   Critical care was necessary to treat or prevent imminent or life-threatening deterioration of the following conditions:  Cardiac failure, circulatory failure and respiratory failure   Critical care was time spent personally by me on the following activities:  Discussions with consultants, evaluation of patient's response to treatment, examination of patient, ordering and performing treatments  and interventions, ordering and review of laboratory studies, ordering and review of radiographic studies, pulse oximetry, re-evaluation of patient's condition, review of old  charts, blood draw for specimens, development of treatment plan with patient or surrogate, interpretation of cardiac output measurements, obtaining history from patient or surrogate and ventilator management   I assumed direction of critical care for this patient from another provider in my specialty: yes   ARTERIAL LINE Date/Time: June 21, 2018 5:02 AM Performed by: Derwood Kaplan, MD Authorized by: Derwood Kaplan, MD   Consent:    Consent obtained:  Emergent situation Indications:    Indications: hemodynamic monitoring and multiple ABGs   Pre-procedure details:    Skin preparation:  2% Chlorhexidine   Preparation: Patient was prepped and draped in sterile fashion   Procedure details:    Location:  L femoral   Needle gauge:  18 G   Placement technique:  Seldinger   Number of attempts:  2   Transducer: waveform confirmed   Post-procedure details:    Post-procedure:  Biopatch applied, secured with tape, sterile dressing applied and sutured   Patient tolerance of procedure:  Tolerated well, no immediate complications   (including critical care time)  Cardiopulmonary Resuscitation (CPR) Procedure Note Directed/Performed by: Sabrina Arriaga I personally directed ancillary staff and/or performed CPR in an effort to regain return of spontaneous circulation and to maintain cardiac, neuro and systemic perfusion.    EMERGENCY DEPARTMENT ULTRASOUND  Study: Limited Retroperitoneal Ultrasound of the Abdominal Aorta.  INDICATIONS:Abnormal vital signs Multiple views of the abdominal aorta were obtained in real-time from the diaphragmatic hiatus to the aortic bifurcation in transverse planes with a multi-frequency probe.  PERFORMED BY: Myself IMAGES ARCHIVED?: Yes LIMITATIONS:  Body habitus INTERPRETATION:  No abdominal aortic aneurysm     EMERGENCY DEPARTMENT [place] CARDIAC EXAM "Study: Limited Ultrasound of the heart and pericardium"  INDICATIONS:Cardiac arrest Multiple views of the heart  and pericardium were obtained in real-time with a multi-frequency probe.  PERFORMED ZO:XWRUEA  IMAGES ARCHIVED?: Yes  FINDINGS: Small effusion, Decreased contractility and Tamponade physiology absent  LIMITATIONS:  Emergent procedure  VIEWS USED: Subcostal 4 chamber and Parasternal long axis  INTERPRETATION: Cardiac activity present, Pericardial effusion present, Cardiac tamponade absent, Decreased contractility, IVC flat and No coordinated contractions   Pt had coordinated contractions and the RV appeared dilated but there was no collpase  CPT Code: [phone] (limited transthoracic cardiac)     Medications Ordered in ED Medications  vasopressin (PITRESSIN) 40 Units in sodium chloride 0.9 % 250 mL (0.16 Units/mL) infusion (0.04 Units/min Intravenous Rate/Dose Change 06-21-18 0647)  sodium chloride 0.9 % bolus 1,000 mL (has no administration in time range)  sodium bicarbonate 1 mEq/mL injection (has no administration in time range)  0.9 %  sodium chloride infusion (has no administration in time range)  THROMBI-PAD 3"X3" pad 1 each (has no administration in time range)  insulin aspart (novoLOG) injection 0-15 Units (has no administration in time range)  sodium bicarbonate 150 mEq in sterile water 1,000 mL infusion (has no administration in time range)  EPINEPHrine (ADRENALIN) 4 mg in dextrose 5 % 250 mL (0.016 mg/mL) infusion (has no administration in time range)  norepinephrine (LEVOPHED) 16 mg in D5W premix infusion (has no administration in time range)  0.9 %  sodium chloride infusion (has no administration in time range)  EPINEPHrine (ADRENALIN) 1 MG/10ML injection (1 mg Intravenous Given 06-21-18 0440)  sodium bicarbonate injection (50 mEq Intravenous Given Jun 21, 2018 0442)  norepinephrine (LEVOPHED) 4mg  in D5W  premix infusion (10 mcg/min Intravenous Rate/Dose Change 08-Jun-2018 0621)  EPINEPHrine (ADRENALIN) 1 MG/10ML injection (1 mg Intravenous Given 06-08-2018 0451)    dextrose 5 % 850 mL with sodium bicarbonate 150 mEq infusion ( Intravenous Rate/Dose Change 06-08-18 0619)  0.9 %  sodium chloride infusion (1,000 mLs Intravenous New Bag/Given June 08, 2018 0504)  sodium bicarbonate injection (50 mEq Intravenous Given 06/08/2018 0519)  EPINEPHrine (ADRENALIN) 1 MG/10ML injection (1 mg Intravenous Given 2018/06/08 0521)  EPINEPHrine (ADRENALIN) 4 mg in dextrose 5 % 250 mL (0.016 mg/mL) infusion (20 mcg/min Intravenous Rate/Dose Change 2018/06/08 0641)  EPINEPHrine (ADRENALIN) 1 MG/10ML injection (1 mg Intravenous Given 06-08-2018 0552)  0.9 %  sodium chloride infusion (1,000 mLs Intravenous New Bag/Given 06/08/18 0602)  sodium bicarbonate injection (50 mEq Intravenous Given 2018/06/08 0614)  sodium bicarbonate injection (50 mEq Intravenous Given June 08, 2018 0619)  tenecteplase (TNKASE) injection 35 mg (35 mg Intravenous Given 2018/06/08 0642)  sodium bicarbonate injection (50 mEq Intravenous Given Jun 08, 2018 0640)     Initial Impression / Assessment and Plan / ED Course  I have reviewed the triage vital signs and the nursing notes.  Pertinent labs & imaging results that were available during my care of the patient were reviewed by me and considered in my medical decision making (see chart for details).  Clinical Course as of Jun 06 723  Wynelle Link June 08, 2018  0706 CCM at the bedside. They are performing a bedside echocardiogram and putting a central line. Family has been updated again by me. Patient appears to be blinking his eyes, and moving his hands.   [AN]    Clinical Course User Index [AN] Derwood Kaplan, MD    72 year old male comes in post arrest. He has history of hypertension and DVT-PE last year after an orthopedic procedure.  According to family, patient was doing well until waking up in the middle night with nausea, diarrhea.  It appears that he had complained of pain in his calf and ankle tightness yesterday, but at no point has patient complained of chest  pain, shortness of breath.  Patient does not have any known coronary artery disease and his social history is not concerning either.  Patient required multiple rounds of CPR in the ED.  At each point he would have ROSC after 1 round of CPR.  EKG is not showing any STEMI. Bedside ultrasound ruled out pneumothorax, pericardial tamponade, AAA. It does appear that his RV is slightly dilated.  After speaking to the family and knowing patient's history of DVT, massive PE is in the differential diagnosis.  Postarrest labs show that patient has profound metabolic acidosis.  He had received 5 A of bicarb by [place] followed by a bicarb drip.  CCM has been consulted.  He is on norepinephrine drip.  For now CCM does not want patient to be cooled.  They will assess the patient and decide on the next step.  I discussed the findings with the family and answered all the questions of the best of my ability.  Patient is full code.  Final Clinical Impressions(s) / ED Diagnoses   Final diagnoses:  Cardiac arrest (HCC)  Metabolic acidosis  Acute respiratory failure with hypoxia [facility])    ED Discharge Orders    None       Derwood Kaplan, MD 2018/06/08 2956    Derwood Kaplan, MD 2018-06-08 [phone]

## 2018-06-05 NOTE — Progress Notes (Signed)
  Echocardiogram 2D Echocardiogram has been performed.  Shardea Cwynar G Burgundy Matuszak 06/11/2018, 10:21 AM

## 2018-06-05 NOTE — Code Documentation (Signed)
Pulses present, afib

## 2018-06-05 NOTE — ED Notes (Signed)
Unsuccessful blood draw,  QNS.Marland KitchenMarland KitchenMarland KitchenI will ask other phleb to collect type and screen.

## 2018-06-05 NOTE — Progress Notes (Signed)
OG coiled in patient's mouth, with copious amounts of bloody secretions pouring out of mouth. Patient suctioned and MD gave order to remove OG and do not replace. [place], [name]

## 2018-06-05 NOTE — Code Documentation (Signed)
Pulse check, no pulse present, CPR resumed

## 2018-06-05 NOTE — Progress Notes (Signed)
ANTICOAGULATION CONSULT NOTE   Pharmacy Consult for heparin Indication: suspected pulmonary embolus  No Known Allergies  Patient Measurements: Height: 5\' 10"  (177.8 cm) Weight: 146 lb 9.7 oz (66.5 kg) IBW/kg (Calculated) : 73  Vital Signs: Temp: 95.2 F (35.1 C) (10/20 1300) Temp Source: Bladder (10/20 0800) BP: 99/79 (10/20 1300) Pulse Rate: 119 (10/20 1300)  Labs: Recent Labs    2018-06-06 0439 06-Jun-2018 0442 June 06, 2018 0854 Jun 06, 2018 1138  HGB 11.6* 13.9 10.9*  --   HCT 39.6 41.0 33.5*  --   PLT 201  --  165  --   APTT 70*  --  >200* 174*  LABPROT 18.5*  --  >90.0*  --   INR 1.56  --  >10.00*  --   CREATININE 2.57* 2.20*  --   --   TROPONINI  --   --  1.98*  --     Estimated Creatinine Clearance: 29 mL/min (A) (by C-G formula based on SCr of 2.2 mg/dL (H)).   Medical History: Past Medical History:  Diagnosis Date  . Hypertension   . Vitamin D deficiency     Assessment: 72yo male was in his USOH and awoke feeling ill and asked wife to call 911 >> had witnessed arrest w/ EMS then multiple arrests in ED, known h/o PE/DVT, now off anticoagulation, reported by wife to c/o calf pain 10/19 >> concern for PE and given TNKase in ED, now to begin heparin gtt when PTT <80.  PTT trending down currently but still remains elevated at 174.  Goal of Therapy:  Heparin level 0.3-0.5 units/ml x24h then increase to 0.3-0.7 units/ml Monitor platelets by anticoagulation protocol: Yes   Plan:  Will monitor PTT and start heparin gtt PTT <80,  then monitor heparin levels and CBC.  Jenetta Downer, [facility] Clinical Pharmacist Phone [phone]  06-Jun-2018 1:11 PM

## 2018-06-05 NOTE — Progress Notes (Signed)
EEG complete - results pending 

## 2018-06-05 NOTE — Progress Notes (Signed)
VASCULAR LAB PRELIMINARY  PRELIMINARY  PRELIMINARY  PRELIMINARY  Bilateral lower extremity venous duplex completed.    Preliminary report:  There is acute DVT noted in the left popliteal vein.  Desmond Szabo, RVT 06/10/2018, 5:55 PM

## 2018-06-05 NOTE — Code Documentation (Addendum)
Pt comes via [facility], called out for abd pain n/v, went pulseless, CPR initiated for 10 minutes, 2 epis given, then ROSC. Pt rearrested in route for 2 minutes, SB being paced. King airway in place

## 2018-06-05 NOTE — Progress Notes (Signed)
I responded to a CPR alert from the ED. I visited with the patient's wife, Alona Bene, and her sister in the Consult Room while an update was given by the physician. I escorted Alona Bene to the trauma room to see and talk to her husband and later escorted her back to the Consult Room to await other family members. I provided spiritual support by sharing words of encouragement and leading in prayer. I shared that the Chaplain is available for additional support as needed or requested.    Jun 15, 2018 0530  Clinical Encounter Type  Visited With Family  Visit Type Spiritual support;ED  Referral From Nurse  Consult/Referral To Chaplain  Stress Factors  Family Stress Factors Exhausted    Chaplain Dr Melvyn Novas

## 2018-06-05 NOTE — Progress Notes (Signed)
Pharmacy Antibiotic Note  Bryan Parker is a 72 y.o. male admitted on 2018-06-25 with possible aspiration pna  Plan: Zosyn 3.375 gm iv q12h vanc 750 q12  Height: 5\' 10"  (177.8 cm) Weight: 146 lb 9.7 oz (66.5 kg) IBW/kg (Calculated) : 73  Temp (24hrs), Avg:95.8 F (35.4 C), Min:90.9 F (32.7 C), Max:100.9 F (38.3 C)  Recent Labs  Lab Jun 25, 2018 0439 June 25, 2018 0441 2018/06/25 0442 06-25-2018 0854 2018/06/25 1515  WBC 8.9  --   --  18.4* 23.4*  CREATININE 2.57*  --  2.20*  --  3.25*  LATICACIDVEN  --  10.82*  --  18.2* 12.4*    Estimated Creatinine Clearance: 19.6 mL/min (A) (by C-G formula based on SCr of 3.25 mg/dL (H)).    No Known Allergies  Isaac Bliss, PharmD, BCPS, BCCCP Clinical Pharmacist [phone]  Please check AMION for all [facility] Pharmacy numbers  Jun 25, 2018 7:32 PM

## 2018-06-05 NOTE — ED Notes (Signed)
Critical care at bedside  

## 2018-06-05 NOTE — Progress Notes (Addendum)
PCCM interval note.  Discussed with Dr. Shirlee Latch from heart failure service  Echo shows severe RV dysfunction We will try inhaled NO and low-dose dobutamine to help RV function. Family updated at bedside.  Chilton Greathouse MD [place] [facility] Pager [phone] If no answer or after 3pm call: [phone] 05/29/2018, 12:01 PM

## 2018-06-05 NOTE — Progress Notes (Signed)
ANTICOAGULATION CONSULT NOTE - Initial Consult  Pharmacy Consult for heparin Indication: suspected pulmonary embolus  No Known Allergies  Patient Measurements: Height: 5\' 10"  (177.8 cm) Weight: 136 lb 14.5 oz (62.1 kg) IBW/kg (Calculated) : 73  Vital Signs: Temp: 93 F (33.9 C) (10/20 0648) Temp Source: Temporal (10/20 0435) BP: 101/57 (10/20 0648) Pulse Rate: 111 (10/20 0505)  Labs: Recent Labs    Jun 18, 2018 0439 2018/06/18 0442  HGB 11.6* 13.9  HCT 39.6 41.0  PLT 201  --   APTT 70*  --   LABPROT 18.5*  --   INR 1.56  --   CREATININE 2.57* 2.20*    Estimated Creatinine Clearance: 27.1 mL/min (A) (by C-G formula based on SCr of 2.2 mg/dL (H)).   Medical History: Past Medical History:  Diagnosis Date  . Hypertension   . Vitamin D deficiency     Assessment: 72yo male was in his USOH and awoke feeling ill and asked wife to call 911 >> had witnessed arrest w/ EMS then multiple arrests in ED, known h/o PE/DVT, now off anticoagulation, reported by wife to c/o calf pain 10/19 >> concern for PE and given TNKase in ED, now to begin heparin gtt when PTT <80.  Goal of Therapy:  Heparin level 0.3-0.5 units/ml x24h then increase to 0.3-0.5 units/ml Monitor platelets by anticoagulation protocol: Yes   Plan:  Will monitor PTT and start heparin gtt at 800 units/hr when PTT <80, then monitor heparin levels and CBC.  Vernard Gambles, PharmD, BCPS  06/18/18,7:37 AM

## 2018-06-05 NOTE — Progress Notes (Signed)
Critical Lab Value- Lactic Acid: 18.2 Troponin 1.98 Dr. Isaiah Serge notified.

## 2018-06-05 NOTE — Procedures (Signed)
Date of recording11/10/19  Referring Provider Radonna Ricker  Reason for the study Intubated/cardiac arrest  Technical Digital EEG recording using 10-20 international electrode system  Description of the recording Posterior dominant rhythm ismostly obscured due to muscle artificate at time is 4-5Hz  Mostly generalized muscle artifact At time intermittent delta waves Epileptiform activity not seen   Impression The EEG isabnormal and findings are consistent withmoderateto severe generalized cerbral dysfunction, Epileptiform features not seen during this recording

## 2018-06-05 NOTE — H&P (Signed)
NAME:  Bryan Parker, MRN:  [id number], DOB:  [birth year], LOS: 0 ADMISSION DATE:  06/11/2018, CONSULTATION DATE:  05/30/2018 REFERRING MD:  Dr. Rhunette Croft , CHIEF COMPLAINT:  Cardiac Arrest    Brief History   72 year old male presents to ED s/p Cardiac Arrest. Per Report patient called EMS for Abdominal Pain/Nausea/Vomiting. When EMS arrived patient was alert however went pulseless and PEA while on scene. ROSC achieved after 2 EPI and 10 minutes CPR. On arrival patient continued to be unstable and required multiple rounds of CPR. Fast exam negative. Bedside ECHO revealed slightly dilated RV. ABG 6.914/69.5/368. Progressive hypotension requiring Levophed/EPI/Vasopresin and 9 Bicarb pushes.   Wife reports that patient was complaining of severe left calf pain all day 10/19. Has a history of PE 2016. Was taken off anticoagulation 2018. Due to high suspicion of PE TNKase was given in ED. PCCM asked to admit.   Past Medical History  H/O DVT/PE, CKD  Significant Hospital Events   10/20 > Presents to ED   Consults: date of consult/date signed off & final recs:  PCCM 10/20  Procedures (surgical and bedside):  ETT 10/20 >> Right Femoral CVC 10/20 >> Left Femoral Aline 10/20 >>   Significant Diagnostic Tests:  CXR 10/20 > Endotracheal tube tip is just below the clavicular heads approximately 2 cm above the inferior margin of the carina. The enteric tube tip and side port are below the field of view. The lungs are clear. Normal cardiomediastinum. CT Head 10/20 >>   Micro Data:  Blood 10/20 >> Sputum 10/20 >>  U/A 10/20 >>   Antimicrobials:  N/A    Subjective:    Objective   Blood pressure (!) 101/57, pulse (!) 111, temperature (!) 93 F (33.9 C), resp. rate (!) 25, height 5\' 10"  (1.778 m), weight 62.1 kg, SpO2 100 %.    Vent Mode: PRVC FiO2 (%):  [100 %] 100 % Set Rate:  [24 bmp-28 bmp] 28 bmp Vt Set:  [580 mL] 580 mL PEEP:  [5 cmH20] 5 cmH20   Intake/Output Summary (Last 24  hours) at 06/04/2018 0708 Last data filed at 06/01/2018 0554 Gross per 24 hour  Intake 2 ml  Output -  Net 2 ml   Filed Weights   06/13/2018 0604  Weight: 62.1 kg    Examination: General: Adult male, on vent  HENT: ETT in place  Lungs: Clear breath sounds, no wheeze/crackles  Cardiovascular: Tachy, Irregular, no MRG  Abdomen: soft, non-distended  Extremities: -edema Neuro: opens eyes to verbal stimulation, does not follow commands GU: foley in place   Resolved Hospital Problem list     Assessment & Plan:   Acute Hypercarbic Respiratory Failure Plan  -Vent Support -Trend ABG/CXR -Pulmonary Hygiene  -VAP Bundle   PEA Cardiac Arrest  -10 minutes initial downtime followed by 2 hours in ED with on/off instability with multiple CODEs Suspected Obstructive Shock due to PE  H/O PE/DVT (not on anticoagulation)  Plan  -Cardiac Monitoring -ECHO/LE U/S pending  -Maintain MAP >65 (Currently on EPI/Levophed/Vasopresin) -Trend CVP -Given TNKase in ED, Follow by Heparin gtt per pharmacy  -BNP pending  -Trend APTT/CBC s/p TNKase   Acute on Chronic Kidney Failure  Severe Anion Gap Metabolic Acidosis with Lactic Acidosis  Plan  -Trend BMP -Trend LA  -Replace electrolyte as indicated -Continue Bicarb gtt @ 150 ml/hr  -Renal U/S Pending   Hyperglycemia  Plan  -Trend Glucose -SSI   Encephalopathy  -Due to shock state vs Anoxic Injury  Plan -Frequent Neuro Checks -CT Head Pending   -ETOH/Salicylate/Tylenol level pending   Disposition / Summary of Today's Plan 06/20/18   72 year old male presents to ED s/p Cardiac Arrest. Per Report patient called EMS for Abdominal Pain/Nausea/Vomiting. When EMS arrived patient was alert however went pulseless and PEA while on scene. ROSC achieved after 2 EPI and 10 minutes CPR. On arrival patient continued to be unstable and required multiple rounds of CPR. Fast exam negative. Bedside ECHO revealed slightly dilated RV. ABG 6.914/69.5/368.  Progressive hypotension requiring Levophed/EPI/Vasopresin and 9 Bicarb pushes.   Wife reports that patient was complaining of severe left calf pain all day 10/19. Has a history of PE 2016. Was taken off anticoagulation 2018. Due to high suspicion of PE TNKase was given in ED. PCCM asked to admit.      Diet: NPO Pain/Anxiety/Delirium protocol VAP protocol DVT prophylaxis: Heparin Gtt  GI prophylaxis: PPI Hyperglycemia protocol Mobility: Bedrest  Code Status: FC Family Communication: Wife updated at bedside   Labs   CBC: Recent Labs  Lab 06/20/18 0439 Jun 20, 2018 0442  WBC 8.9  --   NEUTROABS 2.6  --   HGB 11.6* 13.9  HCT 39.6 41.0  MCV 99.7  --   PLT 201  --     Basic Metabolic Panel: Recent Labs  Lab June 20, 2018 0439 06-20-18 0442  NA 141 137  K 4.1 4.6  CL 102 111  CO2 16*  --   GLUCOSE 350* 192*  BUN 23 30*  CREATININE 2.57* 2.20*  CALCIUM 8.8*  --   MG 3.0*  --   PHOS 11.3*  --    GFR: Estimated Creatinine Clearance: 27.1 mL/min (A) (by C-G formula based on SCr of 2.2 mg/dL (H)). Recent Labs  Lab 06-20-2018 0439 06/20/2018 0441  WBC 8.9  --   LATICACIDVEN  --  10.82*    Liver Function Tests: Recent Labs  Lab Jun 20, 2018 0439  AST 181*  ALT 141*  ALKPHOS 138*  BILITOT 0.6  PROT 6.0*  ALBUMIN 3.2*   No results for input(s): LIPASE, AMYLASE in the last 168 hours. No results for input(s): AMMONIA in the last 168 hours.  ABG    Component Value Date/Time   PHART 7.067 (LL) 06-20-18 0608   PCO2ART 67.8 (HH) 06/20/18 0608   PO2ART 254.0 (H) 2018/06/20 0608   HCO3 20.1 2018-06-20 0608   TCO2 22 2018/06/20 0608   ACIDBASEDEF 10.0 (H) 20-Jun-2018 0608   O2SAT 100.0 Jun 20, 2018 0608     Coagulation Profile: Recent Labs  Lab 20-Jun-2018 0439  INR 1.56    Cardiac Enzymes: No results for input(s): CKTOTAL, CKMB, CKMBINDEX, TROPONINI in the last 168 hours.  HbA1C: No results found for: HGBA1C  CBG: No results for input(s): GLUCAP in the last 168  hours.  Admitting History of Present Illness.   72 year old male presents to ED s/p Cardiac Arrest. Per Report patient called EMS for Abdominal Pain/Nausea/Vomiting. When EMS arrived patient was alert however went pulseless and PEA while on scene. ROSC achieved after 2 EPI and 10 minutes CPR. On arrival patient continued to be unstable and required multiple rounds of CPR. Fast exam negative. Bedside ECHO revealed slightly dilated RV. ABG 6.914/69.5/368. Progressive hypotension requiring Levophed/EPI/Vasopresin and 9 Bicarb pushes.   Wife reports that patient was complaining of severe left calf pain all day 10/19. Has a history of PE 2016. Was taken off anticoagulation 2018. Due to high suspicion of PE TNKase was given in ED. PCCM asked  to admit.   Review of Systems:   Unable to review as patient is intubated  Past Medical History  He,  has a past medical history of Hypertension and Vitamin D deficiency.   Surgical History    Past Surgical History:  Procedure Laterality Date  . PROSTATE SURGERY    . SPINE SURGERY       Social History   Social History   Socioeconomic History  . Marital status: Married    Spouse name: Not on file  . Number of children: Not on file  . Years of education: Not on file  . Highest education level: Not on file  Occupational History  . Not on file  Social Needs  . Financial resource strain: Not on file  . Food insecurity:    Worry: Not on file    Inability: Not on file  . Transportation needs:    Medical: Not on file    Non-medical: Not on file  Tobacco Use  . Smoking status: Former Smoker  Substance and Sexual Activity  . Alcohol use: No  . Drug use: No  . Sexual activity: Not on file  Lifestyle  . Physical activity:    Days per week: Not on file    Minutes per session: Not on file  . Stress: Not on file  Relationships  . Social connections:    Talks on phone: Not on file    Gets together: Not on file    Attends religious service: Not  on file    Active member of club or organization: Not on file    Attends meetings of clubs or organizations: Not on file    Relationship status: Not on file  . Intimate partner violence:    Fear of current or ex partner: Not on file    Emotionally abused: Not on file    Physically abused: Not on file    Forced sexual activity: Not on file  Other Topics Concern  . Not on file  Social History Narrative  . Not on file  ,  reports that he has quit smoking. He does not have any smokeless tobacco history on file. He reports that he does not drink alcohol or use drugs.   Family History   His family history includes Deep vein thrombosis in his sister; Heart attack (age of onset: 7) in his brother; Heart disease in his brother; Pulmonary embolism in his sister.   Allergies No Known Allergies   Home Medications  Prior to Admission medications   Medication Sig Start Date End Date Taking? Authorizing Provider  DiphenhydrAMINE HCl (ALKA-SELTZER PLUS ALLERGY PO) Take 2 each by mouth daily as needed (cold symptoms). Dissolve 2 tablets in water    [provider]  hydrochlorothiazide (HYDRODIURIL) 12.5 MG tablet Take 12.5 mg by mouth daily.    [provider]  naproxen sodium (ANAPROX) 220 MG tablet Take 440 mg by mouth at bedtime as needed (for wrist pain).    [provider]  oxyCODONE-acetaminophen (PERCOCET/ROXICET) 5-325 MG per tablet Take 1-2 tablets by mouth every 3 (three) hours as needed for moderate pain. 10/23/14   Rodolph Bong, MD  polyethylene glycol [facility] Ethelene Hal) packet Take 17 g by mouth daily.    [provider]  Rivaroxaban (XARELTO STARTER PACK) 15 & 20 MG TBPK Take as directed on package: Start with one 15mg  tablet by mouth twice a day with food. On Day 22, switch to one 20mg  tablet once a day with  food. Take 15 mg 2 times daily through 11/09/14, then 20 mg daily starting 11/10/14. 10/23/14   Rodolph Bong, MD  rivaroxaban (XARELTO)  20 MG TABS tablet Take 1 tablet (20 mg total) by mouth daily with supper. 11/10/14   Rodolph Bong, MD  Vitamin D, Ergocalciferol, (DRISDOL) 50000 UNITS CAPS capsule Take 50,000 Units by mouth every 7 (seven) days.    [provider]     Critical care time: 72 minutes      Jovita Kussmaul, AGACNP-BC [facility]  Pgr: [phone]  PCCM Pgr: [phone]

## 2018-06-05 NOTE — Progress Notes (Signed)
ANTICOAGULATION CONSULT NOTE   Pharmacy Consult for heparin Indication: suspected pulmonary embolus  No Known Allergies  Patient Measurements: Height: 5\' 10"  (177.8 cm) Weight: 146 lb 9.7 oz (66.5 kg) IBW/kg (Calculated) : 73  Vital Signs: Temp: 100.8 F (38.2 C) (10/20 1800) Temp Source: Bladder (10/20 0800) BP: 86/61 (10/20 1800) Pulse Rate: 130 (10/20 1800)  Labs: Recent Labs    June 27, 2018 0439 06/27/2018 0442 06-27-2018 0854 June 27, 2018 1138 06-27-2018 1404 06/27/18 1515 2018-06-27 1800  HGB 11.6* 13.9 10.9*  --   --  9.8*  --   HCT 39.6 41.0 33.5*  --   --  29.0*  --   PLT 201  --  165  --   --  195  --   APTT 70*  --  >200* 174* 125*  --  72*  LABPROT 18.5*  --  >90.0*  --   --   --   --   INR 1.56  --  >10.00*  --   --   --   --   CREATININE 2.57* 2.20*  --   --   --  3.25*  --   TROPONINI  --   --  1.98*  --   --   --   --     Estimated Creatinine Clearance: 19.6 mL/min (A) (by C-G formula based on SCr of 3.25 mg/dL (H)).   Medical History: Past Medical History:  Diagnosis Date  . Hypertension   . Vitamin D deficiency     Assessment: 72yo male was in his USOH and awoke feeling ill and asked wife to call 911 >> had witnessed arrest w/ EMS then multiple arrests in ED, known h/o PE/DVT, now off anticoagulation, reported by wife to c/o calf pain 10/19 >> concern for PE and given TNKase in ED, now to begin heparin gtt when PTT <80.  PTT < 80 so can now begin heparin  Goal of Therapy:  Heparin level 0.3-0.5 units/ml x24h then increase to 0.3-0.7 units/ml Monitor platelets by anticoagulation protocol: Yes   Plan:  Heparin gtt 1000 units / hr Initial HL 0100 Daily HL CBC  Isaac Bliss, PharmD, BCPS, BCCCP Clinical Pharmacist [phone]  Please check AMION for all [facility] Pharmacy numbers  06/27/18 6:52 PM

## 2018-06-05 NOTE — Procedures (Signed)
Central Venous Catheter Insertion Procedure Note ARLYN BUERKLE [id number] [birth year]  Procedure: Insertion of Central Venous Catheter Indications: Assessment of intravascular volume, Drug and/or fluid administration and Frequent blood sampling  Procedure Details Consent: Unable to obtain consent because of emergent medical necessity. Time Out: Verified patient identification, verified procedure, site/side was marked, verified correct patient position, special equipment/implants available, medications/allergies/relevent history reviewed, required imaging and test results available.  Performed  Maximum sterile technique was used including antiseptics, cap, gloves, gown, hand hygiene, mask and sheet. Skin prep: Chlorhexidine; local anesthetic administered A antimicrobial bonded/coated triple lumen catheter was placed in the right femoral vein due to emergent situation using the Seldinger technique.  Evaluation Blood flow good Complications: No apparent complications Patient did tolerate procedure well.  Jovita Kussmaul, AGACNP-BC [facility]  Pgr: [phone]  PCCM Pgr: [phone]

## 2018-06-06 ENCOUNTER — Inpatient Hospital Stay (HOSPITAL_COMMUNITY): Payer: Medicare Other

## 2018-06-06 ENCOUNTER — Encounter (HOSPITAL_COMMUNITY): Payer: Self-pay | Admitting: Physician Assistant

## 2018-06-06 DIAGNOSIS — J9601 Acute respiratory failure with hypoxia: Secondary | ICD-10-CM

## 2018-06-06 DIAGNOSIS — I50810 Right heart failure, unspecified: Secondary | ICD-10-CM

## 2018-06-06 DIAGNOSIS — I469 Cardiac arrest, cause unspecified: Secondary | ICD-10-CM

## 2018-06-06 DIAGNOSIS — J9600 Acute respiratory failure, unspecified whether with hypoxia or hypercapnia: Secondary | ICD-10-CM

## 2018-06-06 DIAGNOSIS — N179 Acute kidney failure, unspecified: Secondary | ICD-10-CM

## 2018-06-06 DIAGNOSIS — I2601 Septic pulmonary embolism with acute cor pulmonale: Secondary | ICD-10-CM

## 2018-06-06 DIAGNOSIS — N170 Acute kidney failure with tubular necrosis: Secondary | ICD-10-CM

## 2018-06-06 HISTORY — DX: Acute kidney failure, unspecified: N17.9

## 2018-06-06 HISTORY — DX: Right heart failure, unspecified: I50.810

## 2018-06-06 LAB — CBC
HCT: 24.5 % — ABNORMAL LOW (ref 39.0–52.0)
HCT: 18.9 % — ABNORMAL LOW (ref 39.0–52.0)
HCT: 26.1 % — ABNORMAL LOW (ref 39.0–52.0)
HCT: 21.5 % — ABNORMAL LOW (ref 39.0–52.0)
HEMOGLOBIN: 8.5 g/dL — AB (ref 13.0–17.0)
HEMOGLOBIN: 9 g/dL — AB (ref 13.0–17.0)
Hemoglobin: 6.4 g/dL — CL (ref 13.0–17.0)
Hemoglobin: 7.4 g/dL — ABNORMAL LOW (ref 13.0–17.0)
MCH: 30.2 pg (ref 26.0–34.0)
MCH: 29.9 pg (ref 26.0–34.0)
MCH: 27.7 pg (ref 26.0–34.0)
MCH: 27.5 pg (ref 26.0–34.0)
MCHC: 34.7 g/dL (ref 30.0–36.0)
MCHC: 33.9 g/dL (ref 30.0–36.0)
MCHC: 34.5 g/dL (ref 30.0–36.0)
MCHC: 34.4 g/dL (ref 30.0–36.0)
MCV: 87.2 fL (ref 80.0–100.0)
MCV: 88.3 fL (ref 80.0–100.0)
MCV: 80.3 fL (ref 80.0–100.0)
MCV: 79.9 fL — ABNORMAL LOW (ref 80.0–100.0)
NRBC: 0.1 % (ref 0.0–0.2)
NRBC: 0.2 % (ref 0.0–0.2)
NRBC: 0.2 % (ref 0.0–0.2)
PLATELETS: 124 10*3/uL — AB (ref 150–400)
PLATELETS: 93 10*3/uL — AB (ref 150–400)
PLATELETS: 79 10*3/uL — AB (ref 150–400)
Platelets: 168 10*3/uL (ref 150–400)
RBC: 2.81 MIL/uL — AB (ref 4.22–5.81)
RBC: 2.14 MIL/uL — ABNORMAL LOW (ref 4.22–5.81)
RBC: 3.25 MIL/uL — ABNORMAL LOW (ref 4.22–5.81)
RBC: 2.69 MIL/uL — AB (ref 4.22–5.81)
RDW: 14.3 % (ref 11.5–15.5)
RDW: 14.6 % (ref 11.5–15.5)
RDW: 21.2 % — ABNORMAL HIGH (ref 11.5–15.5)
RDW: 22.2 % — AB (ref 11.5–15.5)
WBC: 30.5 10*3/uL — AB (ref 4.0–10.5)
WBC: 29.5 10*3/uL — ABNORMAL HIGH (ref 4.0–10.5)
WBC: 27.2 10*3/uL — ABNORMAL HIGH (ref 4.0–10.5)
WBC: 27.5 10*3/uL — ABNORMAL HIGH (ref 4.0–10.5)
nRBC: 0.1 % (ref 0.0–0.2)

## 2018-06-06 LAB — GLUCOSE, CAPILLARY
GLUCOSE-CAPILLARY: 372 mg/dL — AB (ref 70–99)
GLUCOSE-CAPILLARY: 344 mg/dL — AB (ref 70–99)
GLUCOSE-CAPILLARY: 323 mg/dL — AB (ref 70–99)
GLUCOSE-CAPILLARY: 316 mg/dL — AB (ref 70–99)
GLUCOSE-CAPILLARY: 174 mg/dL — AB (ref 70–99)
GLUCOSE-CAPILLARY: 118 mg/dL — AB (ref 70–99)
GLUCOSE-CAPILLARY: 87 mg/dL (ref 70–99)
GLUCOSE-CAPILLARY: 91 mg/dL (ref 70–99)
GLUCOSE-CAPILLARY: 78 mg/dL (ref 70–99)
Glucose-Capillary: 362 mg/dL — ABNORMAL HIGH (ref 70–99)
Glucose-Capillary: 334 mg/dL — ABNORMAL HIGH (ref 70–99)
Glucose-Capillary: 358 mg/dL — ABNORMAL HIGH (ref 70–99)
Glucose-Capillary: 263 mg/dL — ABNORMAL HIGH (ref 70–99)
Glucose-Capillary: 301 mg/dL — ABNORMAL HIGH (ref 70–99)
Glucose-Capillary: 228 mg/dL — ABNORMAL HIGH (ref 70–99)
Glucose-Capillary: 238 mg/dL — ABNORMAL HIGH (ref 70–99)
Glucose-Capillary: 152 mg/dL — ABNORMAL HIGH (ref 70–99)
Glucose-Capillary: 103 mg/dL — ABNORMAL HIGH (ref 70–99)
Glucose-Capillary: 57 mg/dL — ABNORMAL LOW (ref 70–99)
Glucose-Capillary: 75 mg/dL (ref 70–99)
Glucose-Capillary: 58 mg/dL — ABNORMAL LOW (ref 70–99)

## 2018-06-06 LAB — COMPREHENSIVE METABOLIC PANEL
ALBUMIN: 1.5 g/dL — AB (ref 3.5–5.0)
ALT: 266 U/L — ABNORMAL HIGH (ref 0–44)
ANION GAP: 11 (ref 5–15)
AST: 485 U/L — AB (ref 15–41)
Alkaline Phosphatase: 62 U/L (ref 38–126)
BILIRUBIN TOTAL: 1 mg/dL (ref 0.3–1.2)
BUN: 35 mg/dL — AB (ref 8–23)
CHLORIDE: 101 mmol/L (ref 98–111)
CO2: 21 mmol/L — ABNORMAL LOW (ref 22–32)
CREATININE: 4.18 mg/dL — AB (ref 0.61–1.24)
Calcium: 5.4 mg/dL — CL (ref 8.9–10.3)
GFR calc Af Amer: 15 mL/min — ABNORMAL LOW (ref >60)
GFR calc non Af Amer: 13 mL/min — ABNORMAL LOW (ref >60)
GLUCOSE: 329 mg/dL — AB (ref 70–99)
POTASSIUM: 2.9 mmol/L — AB (ref 3.5–5.1)
SODIUM: 133 mmol/L — AB (ref 135–145)

## 2018-06-06 LAB — HEPARIN LEVEL (UNFRACTIONATED)
HEPARIN UNFRACTIONATED: 0.72 [IU]/mL — AB (ref 0.30–0.70)
HEPARIN UNFRACTIONATED: 0.8 [IU]/mL — AB (ref 0.30–0.70)
Heparin Unfractionated: 0.41 IU/mL (ref 0.30–0.70)

## 2018-06-06 LAB — TROPONIN I
TROPONIN I: 20.53 ng/mL — AB (ref <0.03)
TROPONIN I: 11.98 ng/mL — AB (ref <0.03)

## 2018-06-06 LAB — BASIC METABOLIC PANEL
ANION GAP: 14 (ref 5–15)
ANION GAP: 13 (ref 5–15)
ANION GAP: 10 (ref 5–15)
BUN: 33 mg/dL — ABNORMAL HIGH (ref 8–23)
BUN: 35 mg/dL — ABNORMAL HIGH (ref 8–23)
BUN: 35 mg/dL — ABNORMAL HIGH (ref 8–23)
CALCIUM: 5.1 mg/dL — AB (ref 8.9–10.3)
CHLORIDE: 99 mmol/L (ref 98–111)
CO2: 21 mmol/L — AB (ref 22–32)
CO2: 20 mmol/L — ABNORMAL LOW (ref 22–32)
CO2: 19 mmol/L — ABNORMAL LOW (ref 22–32)
CREATININE: 4 mg/dL — AB (ref 0.61–1.24)
Calcium: 6.5 mg/dL — ABNORMAL LOW (ref 8.9–10.3)
Calcium: 5.3 mg/dL — CL (ref 8.9–10.3)
Chloride: 101 mmol/L (ref 98–111)
Chloride: 104 mmol/L (ref 98–111)
Creatinine, Ser: 4.23 mg/dL — ABNORMAL HIGH (ref 0.61–1.24)
Creatinine, Ser: 4.53 mg/dL — ABNORMAL HIGH (ref 0.61–1.24)
GFR calc Af Amer: 15 mL/min — ABNORMAL LOW (ref >60)
GFR calc Af Amer: 14 mL/min — ABNORMAL LOW (ref >60)
GFR calc non Af Amer: 14 mL/min — ABNORMAL LOW (ref >60)
GFR, EST AFRICAN AMERICAN: 16 mL/min — AB (ref >60)
GFR, EST NON AFRICAN AMERICAN: 13 mL/min — AB (ref >60)
GFR, EST NON AFRICAN AMERICAN: 12 mL/min — AB (ref >60)
GLUCOSE: 380 mg/dL — AB (ref 70–99)
Glucose, Bld: 235 mg/dL — ABNORMAL HIGH (ref 70–99)
Glucose, Bld: 87 mg/dL (ref 70–99)
POTASSIUM: 3 mmol/L — AB (ref 3.5–5.1)
Potassium: 2.8 mmol/L — ABNORMAL LOW (ref 3.5–5.1)
Potassium: 3.1 mmol/L — ABNORMAL LOW (ref 3.5–5.1)
SODIUM: 134 mmol/L — AB (ref 135–145)
Sodium: 134 mmol/L — ABNORMAL LOW (ref 135–145)
Sodium: 133 mmol/L — ABNORMAL LOW (ref 135–145)

## 2018-06-06 LAB — POCT I-STAT 3, ART BLOOD GAS (G3+)
ACID-BASE DEFICIT: 5 mmol/L — AB (ref 0.0–2.0)
BICARBONATE: 18.9 mmol/L — AB (ref 20.0–28.0)
O2 SAT: 98 %
PCO2 ART: 28.7 mmHg — AB (ref 32.0–48.0)
PO2 ART: 97 mmHg (ref 83.0–108.0)
Patient temperature: 37
TCO2: 20 mmol/L — AB (ref 22–32)
pH, Arterial: 7.427 (ref 7.350–7.450)

## 2018-06-06 LAB — MAGNESIUM: MAGNESIUM: 1.4 mg/dL — AB (ref 1.7–2.4)

## 2018-06-06 LAB — PHOSPHORUS: Phosphorus: 1.1 mg/dL — ABNORMAL LOW (ref 2.5–4.6)

## 2018-06-06 LAB — LACTIC ACID, PLASMA: Lactic Acid, Venous: 6.5 mmol/L (ref 0.5–1.9)

## 2018-06-06 LAB — PREPARE RBC (CROSSMATCH)

## 2018-06-06 LAB — PROCALCITONIN: Procalcitonin: >150 ng/mL

## 2018-06-06 MED ORDER — POTASSIUM PHOSPHATES 15 MMOLE/5ML IV SOLN
40.0000 meq | Freq: Once | INTRAVENOUS | Status: AC
  Administered 2018-06-06: 40 meq via INTRAVENOUS
  Filled 2018-06-06: qty 9.09

## 2018-06-06 MED ORDER — SODIUM CHLORIDE 0.9 % IV BOLUS
1000.0000 mL | Freq: Once | INTRAVENOUS | Status: AC
  Administered 2018-06-06: 1000 mL via INTRAVENOUS

## 2018-06-06 MED ORDER — NOREPINEPHRINE-SODIUM CHLORIDE 16-0.9 MG/250ML-% IV SOLN: 0.0000 ug/min | INTRAVENOUS | Status: DC

## 2018-06-06 MED ORDER — SODIUM CHLORIDE 0.9 % IV BOLUS
500.0000 mL | Freq: Once | INTRAVENOUS | Status: AC
  Administered 2018-06-06: 500 mL via INTRAVENOUS

## 2018-06-06 MED ORDER — POTASSIUM CHLORIDE 10 MEQ/50ML IV SOLN
10.0000 meq | INTRAVENOUS | Status: AC
  Administered 2018-06-07 (×4): 10 meq via INTRAVENOUS
  Filled 2018-06-06 (×3): qty 50

## 2018-06-06 MED ORDER — SODIUM CHLORIDE 0.9 % IV BOLUS: 500.0000 mL | Freq: Once | INTRAVENOUS | Status: AC

## 2018-06-06 MED ORDER — SODIUM CHLORIDE 0.9 % IV SOLN
0.5000 ug/min | INTRAVENOUS | Status: DC
  Administered 2018-06-06 – 2018-06-08 (×15): 20 ug/min via INTRAVENOUS
  Administered 2018-06-08: 16 ug/min via INTRAVENOUS
  Administered 2018-06-09 – 2018-06-10 (×4): 10 ug/min via INTRAVENOUS
  Administered 2018-06-10: 9 ug/min via INTRAVENOUS
  Administered 2018-06-10 (×2): 10 ug/min via INTRAVENOUS
  Administered 2018-06-11: 6 ug/min via INTRAVENOUS
  Filled 2018-06-06 (×28): qty 4

## 2018-06-06 MED ORDER — DEXTROSE 50 % IV SOLN
17.0000 mL | Freq: Once | INTRAVENOUS | Status: AC
  Administered 2018-06-06 (×2): 17 mL via INTRAVENOUS

## 2018-06-06 MED ORDER — CALCIUM GLUCONATE-NACL 1-0.675 GM/50ML-% IV SOLN
1.0000 g | Freq: Once | INTRAVENOUS | Status: AC
  Administered 2018-06-06: 1000 mg via INTRAVENOUS
  Filled 2018-06-06: qty 50

## 2018-06-06 MED ORDER — INSULIN ASPART 100 UNIT/ML ~~LOC~~ SOLN
1.0000 [IU] | SUBCUTANEOUS | Status: DC
  Administered 2018-06-08: 2 [IU] via SUBCUTANEOUS

## 2018-06-06 MED ORDER — MAGNESIUM SULFATE 2 GM/50ML IV SOLN
2.0000 g | Freq: Once | INTRAVENOUS | Status: AC
  Administered 2018-06-06: 2 g via INTRAVENOUS
  Filled 2018-06-06: qty 50

## 2018-06-06 MED ORDER — SODIUM CHLORIDE 0.9 % IV SOLN
INTRAVENOUS | Status: DC
  Administered 2018-06-06 – 2018-06-07 (×5): via INTRAVENOUS
  Filled 2018-06-06 (×10): qty 250

## 2018-06-06 MED ORDER — SODIUM CHLORIDE 0.9% IV SOLUTION
Freq: Once | INTRAVENOUS | Status: AC
  Administered 2018-06-06: via INTRAVENOUS

## 2018-06-06 MED ORDER — DEXTROSE 50 % IV SOLN
INTRAVENOUS | Status: AC
  Administered 2018-06-06: 17 mL via INTRAVENOUS
  Filled 2018-06-06: qty 50

## 2018-06-06 MED ORDER — MAGNESIUM SULFATE IN D5W 1-5 GM/100ML-% IV SOLN
1.0000 g | Freq: Once | INTRAVENOUS | Status: AC
  Administered 2018-06-06: 1 g via INTRAVENOUS
  Filled 2018-06-06: qty 100

## 2018-06-06 MED ORDER — CALCIUM GLUCONATE-NACL 1-0.675 GM/50ML-% IV SOLN
1.0000 g | Freq: Once | INTRAVENOUS | Status: AC
  Administered 2018-06-06: 1000 mg via INTRAVENOUS
  Filled 2018-06-06 (×2): qty 50

## 2018-06-06 MED ORDER — SODIUM CHLORIDE 0.9% IV SOLUTION
Freq: Once | INTRAVENOUS | Status: AC
  Administered 2018-06-06: 10:00:00 via INTRAVENOUS

## 2018-06-06 MED ORDER — INSULIN DETEMIR 100 UNIT/ML ~~LOC~~ SOLN
5.0000 [IU] | Freq: Two times a day (BID) | SUBCUTANEOUS | Status: DC
  Administered 2018-06-06 – 2018-06-07 (×2): 5 [IU] via SUBCUTANEOUS
  Filled 2018-06-06 (×6): qty 0.05

## 2018-06-06 NOTE — Progress Notes (Signed)
Restraints removed at this time, safety mitts placed.

## 2018-06-06 NOTE — Progress Notes (Signed)
ANTICOAGULATION CONSULT NOTE   Pharmacy Consult for heparin Indication: suspected pulmonary embolus and confirmed DVT  No Known Allergies  Patient Measurements: Height: 5\' 10"  (177.8 cm) Weight: 160 lb 7.9 oz (72.8 kg) IBW/kg (Calculated) : 73  Vital Signs: Temp: 97 F (36.1 C) (10/21 1900) Temp Source: Esophageal (10/21 1600) BP: 94/59 (10/21 1900) Pulse Rate: 126 (10/21 1946)  Labs: Recent Labs    06/20/2018 0439  06-20-2018 0854 2018-06-20 1138 2018-06-20 1404  2018-06-20 1800 06/20/2018 1935 06/06/18 0124 06/06/18 0807 06/06/18 1132 06/06/18 1506 06/06/18 1712  HGB 11.6*   < > 10.9*  --   --    < >  --   --  8.5* 6.4*  --  9.0*  --   HCT 39.6   < > 33.5*  --   --    < >  --   --  24.5* 18.9*  --  26.1*  --   PLT 201  --  165  --   --    < >  --   --  168 124*  --  93*  --   APTT 70*  --  >200* 174* 125*  --  72*  --   --   --   --   --   --   LABPROT 18.5*  --  >90.0*  --   --   --   --   --   --   --   --   --   --   INR 1.56  --  >10.00*  --   --   --   --   --   --   --   --   --   --   HEPARINUNFRC  --   --   --   --   --   --   --   --  0.41 0.72*  --   --  0.80*  CREATININE 2.57*   < >  --   --   --    < >  --  3.67* 4.00* 4.18* 4.23*  --   --   TROPONINI  --    < > 1.98*  --   --   --   --  24.95* 20.53* 11.98*  --   --   --    < > = values in this interval not displayed.    Estimated Creatinine Clearance: 16.5 mL/min (A) (by C-G formula based on SCr of 4.23 mg/dL (H)).   Medical History: Past Medical History:  Diagnosis Date  . ARF (acute renal failure) (HCC) 06/06/2018  . Hypertension   . RVF (right ventricular failure) (HCC) 06/06/2018  . Vitamin D deficiency     Assessment: 72yo male admitted on 10/20 after witnessed arrest w/ EMS then multiple arrests in ED. PMH includes known h/o PE/DVT, now off anticoagulation at least 1 year. Reported by wife to have c/o calf pain on 10/19 >> concern for PE and given TNKase in ED on 10/20. Patient found to have DVT. No  chest CT done to r/o PE given AKI.  Pharmacy has been consulted for heparin dosing.  Heparin level is above goal at 0.8 this evening. Given noted bleeding earlier today, will continue to target lower heparin goal due to continued s/sx of bleeding.  Goal of Therapy:  Heparin level 0.3-0.5 units/ml Monitor platelets by anticoagulation protocol: Yes   Plan:  Decrease heparin gtt to 800 units/hr Check heparin level in 8 hours Monitor  daily heparin level and CBC Monitor for s/sx of bleeding  Jeanella Cara, PharmD, [facility] Clinical Pharmacist Please see AMION for all Pharmacists' Contact Phone Numbers 06/06/2018, 7:50 PM

## 2018-06-06 NOTE — Progress Notes (Signed)
Hypoglycemic Event  CBG: 57  Treatment: 17 mL IV D50  Symptoms: None  Follow-up CBG: Time: 1832 CBG Result: 91  Possible Reasons for Event: Continuous insulin drip  Comments/MD notified: IV D50 effective, will re-check CBG in one hour per [facility] Stabilizer instructions.    [employer]

## 2018-06-06 NOTE — Progress Notes (Signed)
This RN called Vassie Loll MD to notify about patient's systolic BP trending down to the 70s. Vassie Loll MD is aware, no orders at this time. Will continue to monitor.

## 2018-06-06 NOTE — Progress Notes (Signed)
ANTICOAGULATION CONSULT NOTE   Pharmacy Consult for heparin Indication: suspected pulmonary embolus and confirmed DVT  No Known Allergies  Patient Measurements: Height: 5\' 10"  (177.8 cm) Weight: 160 lb 7.9 oz (72.8 kg) IBW/kg (Calculated) : 73  Vital Signs: Temp: 98.4 F (36.9 C) (10/21 0915) Temp Source: Bladder (10/21 0600) BP: 90/59 (10/21 0915) Pulse Rate: 113 (10/21 0915)  Labs: Recent Labs    Jul 03, 2018 0439  07-03-2018 0854 07-03-18 1138 July 03, 2018 1404 2018/07/03 1515 2018/07/03 1800 07-03-2018 1935 06/06/18 0124 06/06/18 0807  HGB 11.6*   < > 10.9*  --   --  9.8*  --   --  8.5* 6.4*  HCT 39.6   < > 33.5*  --   --  29.0*  --   --  24.5* 18.9*  PLT 201  --  165  --   --  195  --   --  168 124*  APTT 70*  --  >200* 174* 125*  --  72*  --   --   --   LABPROT 18.5*  --  >90.0*  --   --   --   --   --   --   --   INR 1.56  --  >10.00*  --   --   --   --   --   --   --   HEPARINUNFRC  --   --   --   --   --   --   --   --  0.41 0.72*  CREATININE 2.57*   < >  --   --   --  3.25*  --  3.67* 4.00* 4.18*  TROPONINI  --    < > 1.98*  --   --   --   --  24.95* 20.53* 11.98*   < > = values in this interval not displayed.    Estimated Creatinine Clearance: 16.7 mL/min (A) (by C-G formula based on SCr of 4.18 mg/dL (H)).   Medical History: Past Medical History:  Diagnosis Date  . Hypertension   . Vitamin D deficiency     Assessment: 72yo male admitted on 10/20 after witnessed arrest w/ EMS then multiple arrests in ED. PMH includes known h/o PE/DVT, now off anticoagulation at least 1 year. Reported by wife to have c/o calf pain on 10/19 >> concern for PE and given TNKase in ED on 10/20. Patient found to have DVT. No chest CT done to r/o PE given AKI.  Pharmacy has been consulted for heparin dosing.  Heparin level is above goal at 0.72 this morning. He was noted to have continued bloody output from his foley and some oozing around his femoral A-line site. No bleeding from his mouth  noted. No infusion issues. Hgb down to 6.4 and plts down to 124. He is to receive 2 units pRBCs today. Will continue to target lower heparin goal due to continued s/sx of bleeding.  Goal of Therapy:  Heparin level 0.3-0.5 units/ml Monitor platelets by anticoagulation protocol: Yes   Plan:  Decrease heparin gtt to 900 units/hr Check heparin level in 8 hours Monitor daily heparin level and CBC Monitor for s/sx of bleeding  Arvilla Market, PharmD PGY1 Pharmacy Resident Phone [phone] 06/06/2018     9:18 AM

## 2018-06-06 NOTE — Progress Notes (Signed)
CSW consulted to help family with a letter regarding patient's admission. Patient and wife were scheduled to close on a new house today, so wife needed letter stating patient admitted to ICU. CSW provided letter to patient's family at bedside. CSW will sign off for now. Please re-consult as needed.  Abigail Butts, LCSWA [phone]

## 2018-06-06 NOTE — Progress Notes (Signed)
Bryan Loll, MD notified of critical troponin and calcium levels. See new orders.

## 2018-06-06 NOTE — Progress Notes (Signed)
NAME:  Bryan Parker, MRN:  [id number], DOB:  1946-04-19, LOS: 1 ADMISSION DATE:  05/30/2018, CONSULTATION DATE:  06/11/2018 REFERRING MD:  Dr. Rhunette Croft , CHIEF COMPLAINT:  Cardiac Arrest    Brief History   72 year old male presents to ED s/p Cardiac Arrest. Per Report patient called EMS for Abdominal Pain/Nausea/Vomiting. When EMS arrived patient was alert however went pulseless and PEA while on scene. ROSC achieved after 2 EPI and 10 minutes CPR. On arrival patient continued to be unstable and required multiple rounds of CPR. Fast exam negative. Bedside ECHO revealed slightly dilated RV. ABG 6.914/69.5/368. Progressive hypotension requiring Levophed/EPI/Vasopresin and 9 Bicarb pushes.   Wife reports that patient was complaining of severe left calf pain all day 10/19. Has a history of PE 2016. Was taken off anticoagulation 2018. Due to high suspicion of PE TNKase was given in ED. PCCM asked to admit.   Past Medical History  H/O DVT/PE, CKD  Significant Hospital Events   10/20 > Presents to ED   Consults: date of consult/date signed off & final recs:  PCCM 10/20  Procedures (surgical and bedside):  ETT 10/20 >> Right Femoral CVC 10/20 >> Left Femoral Aline 10/20 >>   Significant Diagnostic Tests:  CXR 10/20 > Endotracheal tube tip is just below the clavicular heads approximately 2 cm above the inferior margin of the carina. The enteric tube tip and side port are below the field of view. The lungs are clear. Normal cardiomediastinum. CT Head 10/20 >> neg  Micro Data:  Blood 10/20 >> Sputum 10/20 >>  U/A 10/20 >>   Antimicrobials:   zosyn 10/20 >> vanc 10/20 >>  Subjective:  He is critically ill, on 3 pressors max doses ON vent On nitric oxide  Objective   Blood pressure 125/60, pulse (!) 109, temperature 98.6 F (37 C), resp. rate (!) 27, height 5\' 10"  (1.778 m), weight 72.8 kg, SpO2 100 %. CVP:  [4 mmHg-10 mmHg] 10 mmHg  Vent Mode: PRVC FiO2 (%):  [40 %] 40 % Set  Rate:  [24 bmp-35 bmp] 24 bmp Vt Set:  [580 mL] 580 mL PEEP:  [5 cmH20] 5 cmH20 Plateau Pressure:  [18 cmH20-27 cmH20] 19 cmH20   Intake/Output Summary (Last 24 hours) at 06/06/2018 0905 Last data filed at 06/06/2018 0700 Gross per 24 hour  Intake 7786.06 ml  Output 374 ml  Net 7412.06 ml   Filed Weights   06/16/2018 0604 06/11/2018 0800 06/06/18 0430  Weight: 62.1 kg 66.5 kg 72.8 kg    Examination: Acutely ill, appears younger than stated age, on vent Sedated on fentanyl, but wakes up easily and follows commands, RA SS -2 Soft mildly distended abdomen, absent bowel sounds S1-S2 tacky Decreased breath sounds bilateral Mild pallor, no icterus, no JVD Right femoral central line, left femoral arterial line, interosseous in place   Chest x-ray 10/20 personally reviewed which shows no new infiltrates ET tube in position  Resolved Hospital Problem list     Assessment & Plan:   Acute Hypercarbic Respiratory Failure Plan   ABG rechecked which appears okay Vent settings reviewed and adjusted  PEA Cardiac Arrest  -10 minutes initial downtime followed by 2 hours in ED with on/off instability with multiple CODEs Suspected Obstructive Shock due to PE , acute cor pulmonale -Given TNKase in ED LLE DVT H/O PE/DVT 2016 (not on anticoagulation)  Plan  -Cardiac Monitoring -Maintain SBP >90 (Currently on EPI/Levophed/Vasopresin) -Trend CVP -ct  Heparin gtt per pharmacy   Sepsis-no  clear source,?  Aspiration pneumonia note high procalcitonin. Continue vancomycin and Zosyn empiric while awaiting cultures   AKI on Chronic Kidney Failure  Severe Anion Gap Metabolic Acidosis with Lactic Acidosis  Plan  -Trend BMP -Continue Bicarb gtt @ 150 ml/hr   Acute blood loss anemia-transfused 2 units PRBCs and recheck, likely related to internal bleeding from TN K , check NG aspirate  Hyperglycemia  Plan  -Trend Glucose -insulin gtt  Acute Encephalopathy  -Due to shock state, doubt sig  Anoxic Injury - follows commands 10/21Plan -Frequent Neuro Checks   Disposition / Summary of Today's Plan 06/06/18    He has obstructive shock due to acute cor pulmonale and a failing right ventricle.  He is on maximum pressors and dobutamine.  He appears to be volume responsive and hypovolemia may be due to acute blood loss so he will be transfused.  This may be also be a component of vasodilatory shock, note high procalcitonin Discussed with CHF service about RVAD placement obvious difficulty would be if there is clot in the pulmonary artery. Options limited at this time will continue volume acute kidney injury may also need dialysis eventually       Code Status: FC Family Communication: Wife updated at bedside , guarded prognosis given  Labs   CBC: Recent Labs  Lab 05/25/2018 0439 05/25/2018 0442 05/17/2018 0854 06/02/2018 1515 06/06/18 0124 06/06/18 0807  WBC 8.9  --  18.4* 23.4* 30.5* 29.5*  NEUTROABS 2.6  --   --   --   --   --   HGB 11.6* 13.9 10.9* 9.8* 8.5* 6.4*  HCT 39.6 41.0 33.5* 29.0* 24.5* 18.9*  MCV 99.7  --  92.8 87.3 87.2 88.3  PLT 201  --  165 195 168 124*    Basic Metabolic Panel: Recent Labs  Lab 06/03/2018 0439 06/09/2018 0442 06/12/2018 1515 06/08/2018 1935 06/06/18 0124  NA 141 137 141 138 134*  K 4.1 4.6 2.5* 2.6* 2.8*  CL 102 111 102 99 99  CO2 16*  --  23 21* 21*  GLUCOSE 350* 192* 354* 355* 380*  BUN 23 30* 28* 32* 33*  CREATININE 2.57* 2.20* 3.25* 3.67* 4.00*  CALCIUM 8.8*  --  6.2* 6.7* 6.5*  MG 3.0*  --   --   --  1.4*  PHOS 11.3*  --   --   --  1.1*   GFR: Estimated Creatinine Clearance: 17.4 mL/min (A) (by C-G formula based on SCr of 4 mg/dL (H)). Recent Labs  Lab 06/01/2018 0854 06/01/2018 1515 05/19/2018 1929 06/02/2018 2229 06/06/18 0124 06/06/18 0414 06/06/18 0807  PROCALCITON 0.38  --   --   --  >150.00  --   --   WBC 18.4* 23.4*  --   --  30.5*  --  29.5*  LATICACIDVEN 18.2* 12.4* 10.0* 9.9*  --  6.5*  --     Liver Function  Tests: Recent Labs  Lab 05/18/2018 0439 05/23/2018 1515  AST 181* 1,122*  ALT 141* 549*  ALKPHOS 138* 136*  BILITOT 0.6 1.2  PROT 6.0* 4.0*  ALBUMIN 3.2* 2.2*   No results for input(s): LIPASE, AMYLASE in the last 168 hours. No results for input(s): AMMONIA in the last 168 hours.  ABG    Component Value Date/Time   PHART 7.468 (H) 06/15/2018 2003   PCO2ART 33.3 06/11/2018 2003   PO2ART 128.0 (H) 06/02/2018 2003   HCO3 24.1 06/01/2018 2003   TCO2 25 06/12/2018 2003   ACIDBASEDEF 3.0 (H)  06-26-18 1209   O2SAT 99.0 06/26/18 2003     Coagulation Profile: Recent Labs  Lab Jun 26, 2018 0439 06/26/2018 0854  INR 1.56 >10.00*    Cardiac Enzymes: Recent Labs  Lab 06-26-18 0854 2018-06-26 1935 06/06/18 0124  TROPONINI 1.98* 24.95* 20.53*    HbA1C: No results found for: HGBA1C  CBG: Recent Labs  Lab 06/06/18 0205 06/06/18 0319 06/06/18 0414 06/06/18 0530 06/06/18 0618  GLUCAP 334* 344* 323* 358* 316*    My critical care time was 60 minutes  Cyril Mourning MD. FCCP. [facility] Pager [phone] If no response call [phone]   06/06/2018

## 2018-06-06 NOTE — Progress Notes (Signed)
eLink Physician-Brief Progress Note Patient Name: SHED NIXON DOB: 12-16-1945 MRN: [id number]   Date of Service  06/06/2018  HPI/Events of Note  Low K, phos, and mag Acute renal failure  eICU Interventions  replaced     Intervention Category Intermediate Interventions: Electrolyte abnormality - evaluation and management  Henry Russel, P 06/06/2018, 3:10 AM

## 2018-06-06 NOTE — Progress Notes (Signed)
eLink Physician-Brief Progress Note Patient Name: MCKADE GURKA DOB: 04/05/1946 MRN: [id number]   Date of Service  06/06/2018  HPI/Events of Note  Hgb 7.4 from 9 still with oozing. K 3.1, Ca 5.1 (corrected to 8.2). On maximum quadruple pressors.  eICU Interventions  Transfuse 2 units PRBC, K 40 meqs and Calcium gluconate 1 gram     Intervention Category Major Interventions: Hemorrhage - evaluation and management Intermediate Interventions: Electrolyte abnormality - evaluation and management  Darl Pikes 06/06/2018, 10:51 PM

## 2018-06-06 NOTE — Progress Notes (Signed)
Initial Nutrition Assessment  DOCUMENTATION CODES:   Not applicable  INTERVENTION:  - If patient to remain intubated and as pressors weaned, recommend initiation of TF. - Recommend Osmolite 1.5 @ 55 mL/hr which will provide 1980 kcal, 83 grams of protein, 269 grams of carb, and 1006 mL free water.  NUTRITION DIAGNOSIS:   Inadequate oral intake related to inability to eat as evidenced by NPO status.  GOAL:   Patient will meet greater than or equal to 90% of their needs  MONITOR:   Vent status, Weight trends, Labs  REASON FOR ASSESSMENT:   Ventilator  ASSESSMENT:   72 y.o. male with hx of HTN, CKD, lg PE 2016 and no longer anticoagulated. He was admitted 10/20 after PEA cardiac arrest. Coded repeatedly in the ER. High suspicion of PE, TNKase given in ED.   BMI indicates normal weight. Patient is intubated with OGT in place. Daughter at bedside initially. She reported that patient "has always been small" and that he used to eat 3 meals/day but that he got married 5 years ago and that he now eats less and snacks during the day. She feels that patient has lost a significant amount of weight over the past 5 years, and then reiterates/tates "but he has always been small so it was not that much weight."  Patient's wife returned to the room and reports that patient is a good cook. That he consumes a cup of coffee and a bottle of Ensure as breakfast each day. He makes something for lunch such as baked beans with hot dogs, and then he makes "a full meal" for dinner each day. She denies him having any difficulty chewing or swallowing or having any changes in appetite/intakes recently. She reports UBW of 137 lb and that he has weighed this for quite a while.  Per Dr. Reginia Naas note this AM: acute hypercarbic respiratory failure, PEA cardiac arrest with 10 minutes initial downtime followed by 2 hours in the ED with multiple codes, suspected obstructive shock d/y PE, sepsis without a clear source but  question aspiration PNA, AKI with severe anion gap metabolic acidosis with lactic acidosis, hyperglycemia being treated with insulin gtt, acute encephalopathy d/t shock--following commands.   Patient is currently intubated on ventilator support MV: 13.6 L/min Temp (24hrs), Avg:99.4 F (37.4 C), Min:97.2 F (36.2 C), Max:101.1 F (38.4 C) Propofol: none BP: 97/62 and MAP: 74  Medications reviewed; 2 g Ca gluconate x1 dose yesterday, 1 g Ca gluconate x2 doses today, 20 mg IV Pepcid BID, 2 g IV Mg sulfate x1 run today, 10 mEq IV KCl x6 runs yesterday, 40 mEq IV KCl x1 run yesterday. Labs reviewed; CBGs: 174-362 mg/dL since ~9604, Na: 540 mmol/L, K: 3 mmol/L, BUN: 35 mg/dL, creatinine: 9.81 mg/dL, Ca: 5.3 mg/dL, GFR: 15 mL/min.  IVF; NS @ 50 mL/hr. Drips; heparin @ 900 units/hr, insulin @ 6.8 units/hr, epi @ 20 mcg/min, vaso @ 0.03 units/min, levo @ 50 mcg/min.    NUTRITION - FOCUSED PHYSICAL EXAM:  Completed; no muscle and no fat wasting, no edema noted at this time.   Diet Order:   Diet Order            Diet NPO time specified  Diet effective now              EDUCATION NEEDS:   No education needs have been identified at this time  Skin:  Skin Assessment: Reviewed RN Assessment  Last BM:  10/20  Height:   Ht Readings  from Last 1 Encounters:  13-Jun-2018 5\' 10"  (1.778 m)    Weight:   Wt Readings from Last 1 Encounters:  06/06/18 72.8 kg    Ideal Body Weight:  75.45 kg  BMI:  Body mass index is 23.03 kg/m.  Estimated Nutritional Needs:   Kcal:  1971  Protein:  74-93 grams (1.2-1.5 grams/kg)  Fluid:  >/= 1.7 L/day     Trenton Gammon, MS, RD, LDN, [facility] Inpatient Clinical Dietitian Pager # [phone] After hours/weekend pager # [phone]

## 2018-06-06 NOTE — Progress Notes (Signed)
CRITICAL VALUE ALERT  Critical Value:  Hemoglobin 6.4  Date & Time Notied:  06/06/2018 1610  Provider Notified: Vassie Loll, MD  Orders Received/Actions taken: 2 u pRBCs

## 2018-06-06 NOTE — Consult Note (Addendum)
Advanced Heart Failure Team Consult Note   Primary Physician: Card, Elwyn Lade, MD PCP-Cardiologist:  Fransico Him, MD  Reason for Consultation: RV failure  HPI:    Bryan Parker is seen today for evaluation of RV failure at the request of Dr Vaughan Browner.   Bryan Parker is a 72 y.o. 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  On 05/22/2018, he had called EMS with abdominal pain, nausea, and vomiting. Per wife, he had been complaining of severe left calf pain earlier in the day. He had a PEA arrest with 10+ minutes of downtown when EMS arrived. Received CPR and 2 amps of epi. He then had 2 hours of instability in ED with multiple codes requiring CPR. He was treated with TNKase for suspected PE. Started on levophed, epinephrine, vasopressin, and dobutamine. Intubated and sedated. Head CT negative. Hypothermia protocol was not started due to instability.   BLE dopplers showed acute DVT in left popliteal vein. He was started on a heparin drip.   Creatinine 4.23. Very little UOP. Renal US with no hydronephrosis.   K 3.0, supped by primary team. Calcium 5.3, has been supped.  Hemoglobin 6.4. 2 units pRBCs ordered. Remains on heparin for suspected PE. He has bright red blood in foley bag, stool is brown ?red tinged, dark red output from NGT. Recheck CBC pending.  WBC 29.5. Tmax 99.7. Respiratory culture: moderate gram positive cocci in pairs and in chains. Blood cultures 10/20 NGTD. On Zosyn and Vanc.  Troponin 1.98 > 24.95 > 20.53 > 11.98. EKG 10/20 with ST changes and IVCD with QRS 128 ms. EKG today much improved. Sinus tach 124 bpm with mild ST depression V3-V5. Low voltage.  ABG: pH 7.43, pCO2 28.7, pO2 97, Bicarb 18.9. Remains intubated with FiO2 60%. Also on iNO 20.  Drips: Dobutamine @ 20 mcg/kg/min Epi @ 20 mcg/min Norepi @ 50 mcg/min Vaso 0.03 units/min  MAPs 70s, but drops to 40-50s with minimal stimulation. Moving all extremities  when awake per RN. CVP 13-14.   Echo 10/17/2014: - Left ventricle: The cavity size was normal. Wall thickness was increased in a pattern of mild LVH. The estimated ejection fraction was 60%. Wall motion was normal; there were no regional wall motion abnormalities. - Aortic valve: There was trivial regurgitation. - Right ventricle: Despite severe pulmonary hypertension, RV size and function are normal. The cavity size was normal. Systolic function was normal. - Tricuspid valve: There was moderate regurgitation. - Pulmonary arteries: PA peak pressure: 73 mm Hg (S).  Echo 06/04/2018 - Left ventricle: The cavity size was normal. Wall thickness was   normal. Systolic function was normal. The estimated ejection   fraction was in the range of 55% to 60%. The study is not   technically sufficient to allow evaluation of LV diastolic   function. - Right ventricle: The cavity size was severely dilated. - Right atrium: The atrium was moderately dilated. - Tricuspid valve: There was moderate regurgitation. - Pulmonary arteries: PA peak pressure: 31 mm Hg (S). - Impressions: Should make sure PE r/o given primary finding of   severe RV dilatation and failure  FH: sister with DVT/PE. Brother with MI at 45  ROS limited due to patient being intubated and sedated.  Review of Systems: [y] = yes, _0  = no   General: Weight gain _1 ; Weight loss _2 ; Anorexia _3 ; Fatigue _4 ; Fever _5 ; Chills _6 ;  Weakness _0   Cardiac: Chest pain/pressure _1 ; Resting SOB _2 ; Exertional SOB _3 ; Orthopnea _4 ; Pedal Edema _5 ; Palpitations _6 ; Syncope _7 ; Presyncope _8 ; Paroxysmal nocturnal dyspnea_9   Pulmonary: Cough _10 ; Wheezing_11 ; Hemoptysis_12 ; Sputum _13 ; Snoring _14   GI: Vomiting[ y]; Dysphagia_15 ; Melena_16 ; Hematochezia _17 ; Heartburn_18 ; Abdominal pain Blue.Reese ]; Constipation _19 ; Diarrhea _20 ; BRBPR _21   GU: Hematuria_22 ; Dysuria _23 ; Nocturia_24   Vascular: Pain in legs with walking _25 ; Pain in  feet with lying flat _26 ; Non-healing sores _27 ; Stroke _28 ; TIA _29 ; Slurred speech _30 ;  Neuro: Headaches_31 ; Vertigo_32 ; Seizures_33 ; Paresthesias_34 ;Blurred vision _35 ; Diplopia _36 ; Vision changes _37   Ortho/Skin: Arthritis _38 ; Joint pain _39 ; Muscle pain _40 ; Joint swelling _41 ; Back Pain _42 ; Rash _43   Psych: Depression_44 ; Anxiety_45   Heme: Bleeding problems _46 ; Clotting disorders _47 ; Anemia _48   Endocrine: Diabetes _49 ; Thyroid dysfunction_50   Home Medications Prior to Admission medications   Medication Sig Start Date End Date Taking? Authorizing Provider  atorvastatin (LIPITOR) 20 MG tablet Take 20 mg by mouth daily. 04/29/18  Yes [provider]  gabapentin (NEURONTIN) 300 MG capsule Take 300 mg by mouth 2 (two) times daily. 04/24/18  Yes [provider]  losartan (COZAAR) 100 MG tablet Take 100 mg by mouth daily. 04/29/18  Yes [provider]  polyethylene glycol (MIRALAX / GLYCOLAX) packet Take 17 g by mouth every other day.    Yes [provider]  Vitamin D, Ergocalciferol, (DRISDOL) 50000 UNITS CAPS capsule Take 50,000 Units by mouth every Saturday.    Yes [provider]  oxyCODONE-acetaminophen (PERCOCET/ROXICET) 5-325 MG per tablet Take 1-2 tablets by mouth every 3 (three) hours as needed for moderate pain. Patient not taking: Reported on 06/12/2018 10/23/14   Eugenie Filler, MD  Rivaroxaban (XARELTO STARTER PACK) 15 & 20 MG TBPK Take as directed on package: Start with one 63m tablet by mouth twice a day with food. On Day 22, switch to one 289mtablet once a day with food. Take 15 mg 2 times daily through 11/09/14, then 20 mg daily starting 11/10/14. Patient not taking: Reported on 06/07/2018 10/23/14   ThEugenie FillerMD  rivaroxaban (XARELTO) 20 MG TABS tablet Take 1 tablet (20 mg total) by mouth daily with supper. Patient not taking: Reported on 05/20/2018 11/10/14   ThEugenie FillerMD    Past Medical History: Past  Medical History:  Diagnosis Date  . AR[place] (acute renal failure) ([facility]  . Hypertension   . 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 Vitamin D deficiency     Past Surgical History: Past Surgical History:  Procedure Laterality Date  . PROSTATE SURGERY    . SPINE SURGERY      Family History: Family History  Problem Relation Age of Onset  . Deep vein thrombosis Sister   . Pulmonary embolism Sister   . Heart attack Brother 5062. Heart disease Brother     Social History: Social History   Socioeconomic History  . Marital status: Married    Spouse name: Not on file  . Number of children: Not on file  . Years of education: Not on file  . Highest education level: Not on file  Occupational History  . Not on file  Social Needs  . Financial resource strain: Not on file  . Food insecurity:    Worry: Not on file    Inability: Not on file  . Transportation needs:    Medical: Not on file    Non-medical: Not on file  Tobacco Use  . Smoking status: Former Smoker  Substance and Sexual Activity  . Alcohol use: No  . Drug use: No  . Sexual activity: Not on file  Lifestyle  . Physical activity:    Days per week: Not on file    Minutes per session: Not on file  . Stress: Not on file  Relationships  . Social connections:    Talks on phone: Not on file    Gets together: Not on file    Attends religious service: Not on file    Active member of club or organization: Not on file    Attends meetings of clubs or organizations: Not on file    Relationship status: Not on file  Other Topics Concern  . Not on file  Social History Narrative  . Not on file    Allergies:  No Known Allergies  Objective:    Vital Signs:   Temp:  [97.2 F (36.2 C)-101.1 F (38.4 C)] 98.4 F (36.9 C) (10/21 1348) Pulse Rate:  [87-139] 109 (10/21 1315) Resp:  [21-36] 24 (10/21 1348) BP: (74-158)/(48-75) 108/64 (10/21 1345) SpO2:  [94 %-100 %] 99 % (10/21 1315) Arterial Line  BP: (72-155)/(33-66) 90/49 (10/21 1348) FiO2 (%):  [40 %] 40 % (10/21 1250) Weight:  [72.8 kg] 72.8 kg (10/21 0430) Last BM Date: 06/07/2018  Weight change: Filed Weights   06/15/2018 0604 06/13/2018 0800 06/06/18 0430  Weight: 62.1 kg 66.5 kg 72.8 kg    Intake/Output:   Intake/Output Summary (Last 24 hours) at 06/06/2018 1400 Last data filed at 06/06/2018 1250 Gross per 24 hour  Intake 8536.11 ml  Output 609 ml  Net 7927.11 ml      Physical Exam    General: Intubated/sedated. Easily awakens HEENT: + ETT +OGT with dark, bloody drainage. Neck: Supple. JVP 13-14. Carotids 2+ bilat; no bruits. No thyromegaly or nodule noted. Cor: PMI nondisplaced. RRR, No M/G/R noted. Right groin TLC with small amount of sanguinous drainage Lungs: Diminished basilar sounds Abdomen: Soft, non-tender, non-distended, no HSM. No bruits or masses. +BS  GU: bright red blood in foley bag Extremities: No cyanosis, clubbing, or rash. Trace BLE edema, tight. Warm.  Neuro: Intubated/sedated  Telemetry   Sinus tach 110s. Personally reviewed.   EKG    EKG 05/27/2018: Sinus tach 140 bpm with ST depression I, II, V4-V6, ST elevation aVR and V1-V2. IVCD with QRS 128 ms.  EKG today much improved. Sinus tach 124 bpm with mild ST depression V3-V5. Low voltage.  Labs   Basic Metabolic Panel: Recent Labs  Lab 06/01/2018 0439  05/25/2018 1515 05/19/2018 1935 06/06/18 0124 06/06/18 0807 06/06/18 1132  NA 141   < > 141 138 134* 133* 134*  K 4.1   < > 2.5* 2.6* 2.8* 2.9* 3.0*  CL 102   < > 102 99 99 101 101  CO2 16*  --  23 21* 21* 21* 20*  GLUCOSE 350*   < > 354* 355* 380* 329* 235*  BUN 23   < > 28* 32* 33* 35* 35*  CREATININE 2.57*   < > 3.25* 3.67* 4.00* 4.18* 4.23*  CALCIUM 8.8*  --  6.2* 6.7* 6.5* 5.4* 5.3*  MG 3.0*  --   --   --  1.4*  --   --   PHOS 11.3*  --   --   --  1.1*  --   --    < > = values in this interval not displayed.    Liver Function Tests: Recent Labs  Lab 06/04/2018 0439  05/21/2018 1515 06/06/18 0807  AST 181* 1,122* 485*  ALT 141* 549* 266*  ALKPHOS 138* 136* 62  BILITOT 0.6 1.2 1.0  PROT 6.0* 4.0* <3.0*  ALBUMIN 3.2* 2.2* 1.5*   No results for input(s): LIPASE, AMYLASE in the last 168 hours. No results for input(s): AMMONIA in the last 168 hours.  CBC: Recent Labs  Lab 06/16/2018 0439 05/18/2018 0442 05/21/2018 0854 06/14/2018 1515 06/06/18 0124 06/06/18 0807  WBC 8.9  --  18.4* 23.4* 30.5* 29.5*  NEUTROABS 2.6  --   --   --   --   --   HGB 11.6* 13.9 10.9* 9.8* 8.5* 6.4*  HCT 39.6 41.0 33.5* 29.0* 24.5* 18.9*  MCV 99.7  --  92.8 87.3 87.2 88.3  PLT 201  --  165 195 168 124*    Cardiac Enzymes: Recent Labs  Lab 06/01/2018 0854 05/23/2018 1935 06/06/18 0124 06/06/18 0807  TROPONINI 1.98* 24.95* 20.53* 11.98*    BNP: BNP (last 3 results) Recent Labs    06/10/2018 0854  BNP 18.0    ProBNP (last 3 results) No results for input(s): PROBNP in the last 8760 hours.   CBG: Recent Labs  Lab 06/06/18 0806 06/06/18 0909 06/06/18 1021 06/06/18 1132 06/06/18 1252  GLUCAP 301* 263* 228* 238* 174*    Coagulation Studies: Recent Labs    06/04/2018 0439 06/13/2018 0854  LABPROT 18.5* >90.0*  INR 1.56 >10.00*     Imaging   US Renal  Result Date: 06/11/2018 CLINICAL DATA:  Acute renal failure. EXAM: RENAL / URINARY TRACT ULTRASOUND COMPLETE COMPARISON:  None. FINDINGS: Right Kidney: Length: 9.7 cm. Echogenicity within normal limits. No mass or hydronephrosis visualized. Benign-appearing cyst in the midpole region of the right kidney measures 1.9 cm in diameter. Left Kidney: Length: 10.1 cm. Echogenicity within normal limits. No mass or hydronephrosis visualized. Bladder: Not visualized. Small amount of bilateral perinephric fluid. Abdominal ascites. Bilateral pleural effusions. IMPRESSION: Normal echogenicity of the kidneys. Benign-appearing right renal cyst. Small amount of bilateral perinephric fluid. Small volume of abdominal ascites. Small  bilateral pleural effusions. Electronically Signed   By: Fidela Salisbury M.D.   On: 05/19/2018 17:47   Dg Abd Portable 1v  Result Date: 06/06/2018 CLINICAL DATA:  72 year old male status post gastric tube placement EXAM: PORTABLE ABDOMEN - 1 VIEW COMPARISON:  Chest x-ray, yesterday 05/25/2018 FINDINGS: A gastric tube is present. The tip lies over the gastric body in good position. External defibrillator pads project over the left chest. A right femoral approach central venous catheter is present. The tip projects over the right sacral ala, presumably within the right common iliac vein. Mild linear atelectasis in the lung bases. No evidence of free air or bowel obstruction. Lower lumbar degenerative disc disease at L4-L5 and L5-S1. IMPRESSION: Well-positioned gastric tube with the tip overlying the gastric body. Right femoral approach central venous catheter. The tip overlies the expected location of the right common iliac vein. Electronically Signed   By: Jacqulynn Cadet M.D.   On: 06/06/2018 13:13      Medications:     Current Medications: . chlorhexidine gluconate (MEDLINE KIT)  15 mL Mouth Rinse BID  . Chlorhexidine Gluconate Cloth  6 each Topical Daily  .  mouth rinse  15 mL Mouth Rinse 10 times per day  . sodium chloride flush  10-40 mL Intracatheter Q12H  . THROMBI-PAD  1 each Topical Once     Infusions: . sodium chloride 50 mL/hr at 05/21/2018 2000  . calcium gluconate 1,000 mg (06/06/18 1354)  . DOBUTamine 20 mcg/kg/min (06/06/18 0241)  . epinephrine 20 mcg/min (06/06/18 1200)  . famotidine (PEPCID) IV 20 mg (06/06/18 1100)  . fentaNYL infusion INTRAVENOUS 150 mcg/hr (06/06/18 0240)  . heparin 900 Units/hr (06/06/18 1200)  . insulin 10.7 mL/hr at 06/06/18 1200  . norepinephrine (LEVOPHED) 16 mg in sodium chloride 0.9 % 250 mL infusion 37.5 mL/hr at 06/06/18 1031  . piperacillin-tazobactam (ZOSYN)  IV 3.375 g (06/06/18 0813)  . sodium chloride    . vancomycin Stopped  (06/06/2018 2123)  . vasopressin (PITRESSIN) infusion - *FOR SHOCK* 0.03 Units/min (06/06/18 1200)       Patient [place] is a 72 y.o. 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  Assessment/Plan   1. 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  - Echo 10/2014: EF 60%, RV normal, PA peak pressure 73 mmHg - Echo 06/08/2018: EF 55-60%, RV severely dilated, moderate TR, moderately dilated RA, PA peak pressure 31 mmHg - CVP 13-14. - On dobutamine 20, NE 50, epi 20, vaso 0.03 - Increase iNO to 30 ppm - Consider RHC when more stable.  - Add low dose dopamine if needed. Give 500 NS bolus now for low BP.   2. PEA arrest - ?secondary to massive PE - Received 10+ minutes CPR. Head CT negative.   3. ?PE - Has hx of PE in 2016, but has not been on chronic anticoagulation (off >1 year) - BLE dopplers positive for left popliteal acute DVT  - Received TNKase in ED. Now on heparin drip  4. ?Sepsis - WBC 29.5. Tmax 99.7.  - Respiratory culture: moderate gram positive cocci in pairs and in chains.  - Blood cultures 10/20 NGTD.  - On Zosyn and Vanc.  5. AKI on CKD - Baseline creatinine looks to be 1.6-2.2 range - Creatinine up to 4.23 in setting of shock - Renal US negative for hydronephrosis  6. Anemia - Hemoglobin 8.5 > 6.3 this am. Received 2 units PRBCs - Received TNKase and now on heparin drip.  - He is oozing at central line. Also has blood in foley bag and dark/red output from NGT  7. Elevated liver enzymes - In setting of shock. Trend.  8. Hypocalcemia - Ca++ 5.3. Received supp.  9. Acute respiratory failure - Remains intubated. FiO2 40% - ABG: pH 7.43, pCO2 28.7, pO2 97, Bicarb 18.9.   Medication concerns reviewed with patient and pharmacy team. Barriers identified: unable to determine at this time.   Length of Stay: Joanna, NP  06/06/2018, 2:00 PM  Advanced Heart Failure Team Pager [phone] (M-F; 7a -  4p)  Please contact [facility] for night-coverage after hours (4p -7a ) and weekends on [email or web address]  Patient seen with NP, agree with the above note.    Patient has left popliteal DVT and presented with cardiovascular collapse thought to be due to massive PE.  He has prior history of PE in 3/16.  He received tenecteplase in the ER and is now on a heparin gtt.  He is on multiple pressors/inotropes as noted above.  Hgb was down to 6 this morning with bleeding from  OGT and foley.  He has had 2 units, no repeat CBC.  AKI on CKD, creatinine > 4.  Minimal UOP.  Echo reviewed, normal LV size and systolic function with severely dilated/dysfunctional RV with McConnell's sign.  Echo after last PE in 2016, of note, showed severe pulmonary hypertension.   MAP around 60 currently.  Patient is a limited code.  TnI went up to 21.  ECG currently ST, narrow QRS.  Yesterday's ECG with IVCD.   On exam, JVP 14 cm.  Heart tachy, +S3 (right-sided).  Decreased BS at bases.  He awakens and can follow commands.    1. Cardiogenic shock: Due to RV failure with presumed massive PE.  Patient has left popliteal DVT.  He had not been on anticoagulation, had prior DVT/PE in 2016. He had tenecteplase yesterday and is now on heparin gtt.  However, still with shock on multiple pressors and dobutamine.  Suspect he had a degree of pre-existing RV failure, PA pressure was very high by echo back in 2016 after initial PE.  - He is near maximal pressors support with MAP in lower 60s.  Could add dopamine at this point if BP falls again.   - Discussed RV Impella with Dr. Elsworth Soho and Impella rep.  I am reticent to place RV Impella at this point with clot in venous system, would be concerned about clot entering device.  The patient also appears to have GI hemorrhage post-tPA with hgb down to 6.4 this morning (awaiting repeat).  - Will support with volume and blood.  Awaiting repeat CBC, will give another 500 cc NS.  2. AKI: Creatinine up to 4.2  with cardiogenic shock/RV failure.  Support RV with inotrope (on dobutamine 20) and volume.  3. Anemia: Acute blood loss anemia post-tPA.  He has had blood from OGT and in urine.  Got 2 units PRBCs, repeating CBC.  4. PEA arrest: Initially with CPR.  He wakes up now on vent.  5. ID: GPCs in sputum, he is getting vancomycin/Zosyn empirically.   Prognosis is poor, patient is now a limited code.   Loralie Champagne 06/06/2018

## 2018-06-06 NOTE — Progress Notes (Signed)
CRITICAL VALUE ALERT  Critical Value:  Calcium 5.3  Date & Time Notied:  06/06/2018 1135  Provider Notified: Cyril Mourning MD  Orders Received/Actions taken: Repeat 1g  IV calcium gluconate

## 2018-06-06 NOTE — Progress Notes (Addendum)
Patient with decreased urine output. Frank blood noted in foley. Bladder scan done and resulted 235. Flushed foley with 10 ml NS with return of 10 ml of bloody drainage returned. Afterward, no further drainage noted. Protective seal removed from foley catheter and foley irrigated with 50 ml of sterile water x2. Frank blood noted on return without clots. Total output was 125. ELink notified.

## 2018-06-06 NOTE — Progress Notes (Addendum)
Progress Note  Patient Name: Bryan Parker Date of Encounter: 06/06/2018  Primary Cardiologist: Fransico Him, MD  Subjective   Intubated on the vent, sedated.   Per RN, BP will come up when blood tx given, then drop again. No weaning of the 4 pressors yet.   Inpatient Medications    Scheduled Meds: . chlorhexidine gluconate (MEDLINE KIT)  15 mL Mouth Rinse BID  . Chlorhexidine Gluconate Cloth  6 each Topical Daily  . mouth rinse  15 mL Mouth Rinse 10 times per day  . sodium chloride flush  10-40 mL Intracatheter Q12H  . THROMBI-PAD  1 each Topical Once   Continuous Infusions: . sodium chloride 50 mL/hr at 05/29/2018 2000  . calcium gluconate    . DOBUTamine 20 mcg/kg/min (06/06/18 0241)  . epinephrine 20 mcg/min (06/06/18 1200)  . famotidine (PEPCID) IV 20 mg (06/06/18 1100)  . fentaNYL infusion INTRAVENOUS 150 mcg/hr (06/06/18 0240)  . heparin 900 Units/hr (06/06/18 1200)  . insulin 10.7 mL/hr at 06/06/18 1200  . norepinephrine (LEVOPHED) 16 mg in sodium chloride 0.9 % 250 mL infusion 37.5 mL/hr at 06/06/18 1031  . piperacillin-tazobactam (ZOSYN)  IV 3.375 g (06/06/18 0813)  . sodium chloride    . vancomycin Stopped (06/08/2018 2123)  . vasopressin (PITRESSIN) infusion - *FOR SHOCK* 0.03 Units/min (06/06/18 1200)   PRN Meds: fentaNYL (SUBLIMAZE) injection, fentaNYL (SUBLIMAZE) injection, midazolam, sodium chloride flush   Vital Signs    Vitals:   06/06/18 1245 06/06/18 1250 06/06/18 1300 06/06/18 1315  BP: 102/75  116/67 110/71  Pulse: (!) 112 (!) 106 (!) 105 (!) 109  Resp: (!) 24 (!) 24 (!) 24 (!) 24  Temp: 98.4 F (36.9 C) 98.4 F (36.9 C) 98.2 F (36.8 C) 98.2 F (36.8 C)  TempSrc:  Bladder    SpO2: 100% 100% 100% 99%  Weight:      Height:        Intake/Output Summary (Last 24 hours) at 06/06/2018 1334 Last data filed at 06/06/2018 1250 Gross per 24 hour  Intake 8956.17 ml  Output 609 ml  Net 8347.17 ml   Filed Weights   05/28/2018 0604 05/21/2018  0800 06/06/18 0430  Weight: 62.1 kg 66.5 kg 72.8 kg    Telemetry    ST, HR generally 100-125, no sig ectopy - Personally Reviewed  ECG    10/20 ECG Atrial fib, RVR, HR 140, QRS morphology markedly different from 2016, also w/ ?ST changes   - Personally Reviewed  Physical Exam   General: Well developed, well nourished, male sedated on the vent. Head: Normocephalic, atraumatic.  Neck: Supple without bruits, JVD not seen elevated, difficult to assess 2nd body habitus and equipment. Lungs:  Resp regular and unlabored, scattered rales noted. Heart: RRR, S1, S2, no S3, S4, or murmur; no rub. Abdomen: Soft, non-tender, distended w/ minimal bowel sounds. No hepatomegaly. No rebound/guarding. No obvious abdominal masses. Extremities: No clubbing, cyanosis, no edema. Distal pedal pulses are 2+ bilaterally. Neuro: Sedated on the vent  Labs    Hematology Recent Labs  Lab 06/04/2018 1515 06/06/18 0124 06/06/18 0807  WBC 23.4* 30.5* 29.5*  RBC 3.32* 2.81* 2.14*  HGB 9.8* 8.5* 6.4*  HCT 29.0* 24.5* 18.9*  MCV 87.3 87.2 88.3  MCH 29.5 30.2 29.9  MCHC 33.8 34.7 33.9  RDW 14.3 14.3 14.6  PLT 195 168 124*    Chemistry Recent Labs  Lab 06/07/2018 0439  05/31/2018 1515  06/06/18 0124 06/06/18 0807 06/06/18 1132  NA 141   < >  141   < > 134* 133* 134*  K 4.1   < > 2.5*   < > 2.8* 2.9* 3.0*  CL 102   < > 102   < > 99 101 101  CO2 16*  --  23   < > 21* 21* 20*  GLUCOSE 350*   < > 354*   < > 380* 329* 235*  BUN 23   < > 28*   < > 33* 35* 35*  CREATININE 2.57*   < > 3.25*   < > 4.00* 4.18* 4.23*  CALCIUM 8.8*  --  6.2*   < > 6.5* 5.4* 5.3*  PROT 6.0*  --  4.0*  --   --  <3.0*  --   ALBUMIN 3.2*  --  2.2*  --   --  1.5*  --   AST 181*  --  1,122*  --   --  485*  --   ALT 141*  --  549*  --   --  266*  --   ALKPHOS 138*  --  136*  --   --  62  --   BILITOT 0.6  --  1.2  --   --  1.0  --   GFRNONAA 24*  --  18*   < > 14* 13* 13*  GFRAA 27*  --  21*   < > 16* 15* 15*  ANIONGAP 23*  --   16*   < > 14 11 13    < > = values in this interval not displayed.     Cardiac Enzymes Recent Labs  Lab 06/14/2018 0854 06/14/2018 1935 06/06/18 0124 06/06/18 0807  TROPONINI 1.98* 24.95* 20.53* 11.98*    Recent Labs  Lab 05/31/2018 0443  TROPIPOC 0.11*     BNP Recent Labs  Lab 06/16/2018 0854  BNP 18.0      Radiology    Ct Head Wo Contrast  Result Date: 06/12/2018 CLINICAL DATA:  Altered level of consciousness. Status post cardiac arrest. EXAM: CT HEAD WITHOUT CONTRAST TECHNIQUE: Contiguous axial images were obtained from the base of the skull through the vertex without intravenous contrast. COMPARISON:  None. FINDINGS: Brain: No acute infarct, hemorrhage, or mass lesion is present. Minimal white matter changes are within normal limits for age. Basal ganglia are intact. Insert pass ventricles No significant extraaxial fluid collection is present. The brainstem and cerebellum are within normal limits. Vascular: Atherosclerotic calcifications are present within the cavernous internal carotid arteries bilaterally. There is no hyperdense vessel. Skull: Calvarium is intact. No focal lytic or blastic lesions are present. Sinuses/Orbits: There is mild mucosal thickening along the floor of the maxillary sinuses bilaterally. The remaining paranasal sinuses and the mastoid air cells are clear. Globes and orbits are within normal limits. IMPRESSION: Normal CT of the head for age. Electronically Signed   By: San Morelle M.D.   On: 06/01/2018 08:12   US Renal  Result Date: 06/04/2018 CLINICAL DATA:  Acute renal failure. EXAM: RENAL / URINARY TRACT ULTRASOUND COMPLETE COMPARISON:  None. FINDINGS: Right Kidney: Length: 9.7 cm. Echogenicity within normal limits. No mass or hydronephrosis visualized. Benign-appearing cyst in the midpole region of the right kidney measures 1.9 cm in diameter. Left Kidney: Length: 10.1 cm. Echogenicity within normal limits. No mass or hydronephrosis visualized.  Bladder: Not visualized. Small amount of bilateral perinephric fluid. Abdominal ascites. Bilateral pleural effusions. IMPRESSION: Normal echogenicity of the kidneys. Benign-appearing right renal cyst. Small amount of bilateral perinephric fluid. Small volume of abdominal  ascites. Small bilateral pleural effusions. Electronically Signed   By: Fidela Salisbury M.D.   On: 05/26/2018 17:47   Dg Chest Port 1 View  Result Date: 05/23/2018 CLINICAL DATA:  Intubation after CPR EXAM: PORTABLE CHEST 1 VIEW COMPARISON:  Chest radiograph 10/20/2014 FINDINGS: Endotracheal tube tip is just below the clavicular heads approximately 2 cm above the inferior margin of the carina. The enteric tube tip and side port are below the field of view. The lungs are clear. Normal cardiomediastinum. IMPRESSION: 1. Endotracheal tube tip 2 cm above the inferior carina. 2. Clear lungs. Electronically Signed   By: Ulyses Jarred M.D.   On: 06/15/2018 05:38   Dg Abd Portable 1v  Result Date: 06/06/2018 CLINICAL DATA:  72 year old male status post gastric tube placement EXAM: PORTABLE ABDOMEN - 1 VIEW COMPARISON:  Chest x-ray, yesterday 05/30/2018 FINDINGS: A gastric tube is present. The tip lies over the gastric body in good position. External defibrillator pads project over the left chest. A right femoral approach central venous catheter is present. The tip projects over the right sacral ala, presumably within the right common iliac vein. Mild linear atelectasis in the lung bases. No evidence of free air or bowel obstruction. Lower lumbar degenerative disc disease at L4-L5 and L5-S1. IMPRESSION: Well-positioned gastric tube with the tip overlying the gastric body. Right femoral approach central venous catheter. The tip overlies the expected location of the right common iliac vein. Electronically Signed   By: Jacqulynn Cadet M.D.   On: 06/06/2018 13:13     Cardiac Studies   ECHO:  06/07/2018 - Left ventricle: The cavity size was  normal. Wall thickness was   normal. Systolic function was normal. The estimated ejection   fraction was in the range of 55% to 60%. The study is not   technically sufficient to allow evaluation of LV diastolic   function. - Right ventricle: The cavity size was severely dilated. - Right atrium: The atrium was moderately dilated. - Tricuspid valve: There was moderate regurgitation. - Pulmonary arteries: PA peak pressure: 31 mm Hg (S). - Impressions: Should make sure PE r/o given primary finding of   severe RV dilatation and failure  Impressions:  - Should make sure PE r/o given primary finding of severe RV   dilatation and failure  LE DOPPLERS: 05/22/2018 Right Venous Findings: +---------+---------------+---------+-----------+----------+-------------------+      CompressibilityPhasicitySpontaneityPropertiesSummary       +---------+---------------+---------+-----------+----------+-------------------+ CFV   Full      Yes   Yes                    +---------+---------------+---------+-----------+----------+-------------------+ SFJ   Full                                 +---------+---------------+---------+-----------+----------+-------------------+ FV Prox Full                                 +---------+---------------+---------+-----------+----------+-------------------+ FV Mid  Full                                 +---------+---------------+---------+-----------+----------+-------------------+ FV DistalFull                                 +---------+---------------+---------+-----------+----------+-------------------+ PFV   Full                                 +---------+---------------+---------+-----------+----------+-------------------+  POP                            unable to                                   adequately                                   visualize      +---------+---------------+---------+-----------+----------+-------------------+ PTV   Full                                 +---------+---------------+---------+-----------+----------+-------------------+ PERO   Full                                 +---------+---------------+---------+-----------+----------+-------------------+ GSV   Full                                 +---------+---------------+---------+-----------+----------+-------------------+    Left Venous Findings: +---------+---------------+---------+-----------+----------+-------------------+      CompressibilityPhasicitySpontaneityPropertiesSummary       +---------+---------------+---------+-----------+----------+-------------------+ CFV                           line and bandages                               in groin       +---------+---------------+---------+-----------+----------+-------------------+ FV Prox Full      Yes   Yes                    +---------+---------------+---------+-----------+----------+-------------------+ FV Mid  Full                                 +---------+---------------+---------+-----------+----------+-------------------+ FV DistalFull                                 +---------+---------------+---------+-----------+----------+-------------------+ POP   Partial                    Acute         +---------+---------------+---------+-----------+----------+-------------------+   Left Technical Findings: Not visualized segments include common femoral, SFJ, profunda, posterior tibial, and peroneal veins.    Patient Profile     72 y.o. male w/ hx HTN, CKD, lg PE 2016 rx w/ thrombolytics but no longer anticoagulated, was admitted 10/20 after PEA cardiac arrest, ROSC after 10" & 2 Epi. Coded repeatedly in the ER. High suspicion of PE, TNKase given in ED. Cards seeing for RV failure.  Assessment & Plan    1. RV failure:  - continue volume support, he is preload dependent - troponin significantly elevated, but ARF precludes cath - LE Dopplers performed, results above, MD to review. - continue support per CCM, Cards will continue to follow.  2. PAF:  - Seen on initial ECG - was in SR on arrival to CCU - continue to follow on telemetry.  Otherwise, per CCM Principal Problem:   Cardiac arrest [facility] Active Problems:  CKD (chronic kidney disease)   ARF (acute renal failure) (HCC)   RVF (right ventricular failure) ([place])   Signed, Rosaria Ferries , PA-C 1:34 PM 06/06/2018 Pager: [phone]  CHF team to follow.

## 2018-06-06 NOTE — Progress Notes (Signed)
ANTICOAGULATION CONSULT NOTE - Follow Up Consult  Pharmacy Consult for heparin Indication: suspected pulmonary embolus and confirmed DVT  Labs: Recent Labs    06-18-18 0439  06-18-2018 0854 2018/06/18 1138 06-18-18 1404 06/18/18 1515 2018/06/18 1800 06/18/18 1935 06/06/18 0124  HGB 11.6*   < > 10.9*  --   --  9.8*  --   --  8.5*  HCT 39.6   < > 33.5*  --   --  29.0*  --   --  24.5*  PLT 201  --  165  --   --  195  --   --  168  APTT 70*  --  >200* 174* 125*  --  72*  --   --   LABPROT 18.5*  --  >90.0*  --   --   --   --   --   --   INR 1.56  --  >10.00*  --   --   --   --   --   --   HEPARINUNFRC  --   --   --   --   --   --   --   --  0.41  CREATININE 2.57*   < >  --   --   --  3.25*  --  3.67* 4.00*  TROPONINI  --   --  1.98*  --   --   --   --  24.95* 20.53*   < > = values in this interval not displayed.    Assessment/Plan:  72yo male therapeutic on heparin with initial dosing s/p lytics. RN notes frank blood in foley. Will continue gtt at current rate and confirm stable with additional level.   Vernard Gambles, PharmD, BCPS  06/06/2018,2:41 AM

## 2018-06-07 ENCOUNTER — Inpatient Hospital Stay (HOSPITAL_COMMUNITY): Payer: Medicare Other

## 2018-06-07 LAB — BASIC METABOLIC PANEL
ANION GAP: 14 (ref 5–15)
ANION GAP: 10 (ref 5–15)
BUN: 36 mg/dL — ABNORMAL HIGH (ref 8–23)
BUN: 41 mg/dL — ABNORMAL HIGH (ref 8–23)
CALCIUM: 5.4 mg/dL — AB (ref 8.9–10.3)
CO2: 17 mmol/L — ABNORMAL LOW (ref 22–32)
CO2: 17 mmol/L — AB (ref 22–32)
Calcium: 5.3 mg/dL — CL (ref 8.9–10.3)
Chloride: 103 mmol/L (ref 98–111)
Chloride: 108 mmol/L (ref 98–111)
Creatinine, Ser: 4.83 mg/dL — ABNORMAL HIGH (ref 0.61–1.24)
Creatinine, Ser: 5.23 mg/dL — ABNORMAL HIGH (ref 0.61–1.24)
GFR calc Af Amer: 13 mL/min — ABNORMAL LOW (ref >60)
GFR, EST AFRICAN AMERICAN: 12 mL/min — AB (ref >60)
GFR, EST NON AFRICAN AMERICAN: 11 mL/min — AB (ref >60)
GFR, EST NON AFRICAN AMERICAN: 10 mL/min — AB (ref >60)
GLUCOSE: 77 mg/dL (ref 70–99)
GLUCOSE: 98 mg/dL (ref 70–99)
POTASSIUM: 4.7 mmol/L (ref 3.5–5.1)
POTASSIUM: 5.6 mmol/L — AB (ref 3.5–5.1)
Sodium: 134 mmol/L — ABNORMAL LOW (ref 135–145)
Sodium: 135 mmol/L (ref 135–145)

## 2018-06-07 LAB — MAGNESIUM
Magnesium: 1.7 mg/dL (ref 1.7–2.4)
Magnesium: 2 mg/dL (ref 1.7–2.4)

## 2018-06-07 LAB — GLUCOSE, CAPILLARY
GLUCOSE-CAPILLARY: 121 mg/dL — AB (ref 70–99)
GLUCOSE-CAPILLARY: 136 mg/dL — AB (ref 70–99)
GLUCOSE-CAPILLARY: 80 mg/dL (ref 70–99)
GLUCOSE-CAPILLARY: 97 mg/dL (ref 70–99)
GLUCOSE-CAPILLARY: 98 mg/dL (ref 70–99)
GLUCOSE-CAPILLARY: 99 mg/dL (ref 70–99)
Glucose-Capillary: 86 mg/dL (ref 70–99)
Glucose-Capillary: 59 mg/dL — ABNORMAL LOW (ref 70–99)

## 2018-06-07 LAB — HEPATIC FUNCTION PANEL
ALBUMIN: 1.4 g/dL — AB (ref 3.5–5.0)
ALT: 166 U/L — AB (ref 0–44)
AST: 271 U/L — AB (ref 15–41)
Alkaline Phosphatase: 45 U/L (ref 38–126)
Bilirubin, Direct: 0.9 mg/dL — ABNORMAL HIGH (ref 0.0–0.2)
Indirect Bilirubin: 1.1 mg/dL — ABNORMAL HIGH (ref 0.3–0.9)
Total Bilirubin: 2 mg/dL — ABNORMAL HIGH (ref 0.3–1.2)
Total Protein: <3 g/dL — ABNORMAL LOW (ref 6.5–8.1)

## 2018-06-07 LAB — CBC
HCT: 19.2 % — ABNORMAL LOW (ref 39.0–52.0)
HEMATOCRIT: 27 % — AB (ref 39.0–52.0)
HEMOGLOBIN: 9.1 g/dL — AB (ref 13.0–17.0)
Hemoglobin: 6.6 g/dL — CL (ref 13.0–17.0)
MCH: 28.4 pg (ref 26.0–34.0)
MCH: 28.3 pg (ref 26.0–34.0)
MCHC: 33.7 g/dL (ref 30.0–36.0)
MCHC: 34.4 g/dL (ref 30.0–36.0)
MCV: 84.4 fL (ref 80.0–100.0)
MCV: 82.4 fL (ref 80.0–100.0)
NRBC: 0.3 % — AB (ref 0.0–0.2)
PLATELETS: 55 10*3/uL — AB (ref 150–400)
Platelets: 60 10*3/uL — ABNORMAL LOW (ref 150–400)
RBC: 3.2 MIL/uL — ABNORMAL LOW (ref 4.22–5.81)
RBC: 2.33 MIL/uL — AB (ref 4.22–5.81)
RDW: 18.1 % — ABNORMAL HIGH (ref 11.5–15.5)
RDW: 18.4 % — AB (ref 11.5–15.5)
WBC: 25.6 10*3/uL — ABNORMAL HIGH (ref 4.0–10.5)
WBC: 23.5 10*3/uL — AB (ref 4.0–10.5)
nRBC: 0.2 % (ref 0.0–0.2)

## 2018-06-07 LAB — POCT I-STAT 3, ART BLOOD GAS (G3+)
ACID-BASE DEFICIT: 7 mmol/L — AB (ref 0.0–2.0)
Acid-base deficit: 10 mmol/L — ABNORMAL HIGH (ref 0.0–2.0)
Bicarbonate: 14.1 mmol/L — ABNORMAL LOW (ref 20.0–28.0)
Bicarbonate: 17.6 mmol/L — ABNORMAL LOW (ref 20.0–28.0)
O2 SAT: 95 %
O2 Saturation: 98 %
PCO2 ART: 21.8 mmHg — AB (ref 32.0–48.0)
PO2 ART: 105 mmHg (ref 83.0–108.0)
PO2 ART: 71 mmHg — AB (ref 83.0–108.0)
Patient temperature: 36.1
TCO2: 15 mmol/L — ABNORMAL LOW (ref 22–32)
TCO2: 19 mmol/L — AB (ref 22–32)
pCO2 arterial: 31 mmHg — ABNORMAL LOW (ref 32.0–48.0)
pH, Arterial: 7.357 (ref 7.350–7.450)
pH, Arterial: 7.416 (ref 7.350–7.450)

## 2018-06-07 LAB — CULTURE, RESPIRATORY W GRAM STAIN

## 2018-06-07 LAB — PHOSPHORUS: Phosphorus: 6.7 mg/dL — ABNORMAL HIGH (ref 2.5–4.6)

## 2018-06-07 LAB — PREPARE RBC (CROSSMATCH)

## 2018-06-07 LAB — PROTIME-INR
INR: 1.95
Prothrombin Time: 21.9 seconds — ABNORMAL HIGH (ref 11.4–15.2)

## 2018-06-07 LAB — HEPARIN LEVEL (UNFRACTIONATED)
HEPARIN UNFRACTIONATED: 0.32 [IU]/mL (ref 0.30–0.70)
Heparin Unfractionated: 0.74 IU/mL — ABNORMAL HIGH (ref 0.30–0.70)

## 2018-06-07 LAB — CULTURE, RESPIRATORY: CULTURE: NORMAL

## 2018-06-07 LAB — PROCALCITONIN: Procalcitonin: >150 ng/mL

## 2018-06-07 LAB — LACTIC ACID, PLASMA: LACTIC ACID, VENOUS: 2.8 mmol/L — AB (ref 0.5–1.9)

## 2018-06-07 MED ORDER — MAGNESIUM SULFATE 2 GM/50ML IV SOLN
2.0000 g | Freq: Once | INTRAVENOUS | Status: AC
  Administered 2018-06-07: 2 g via INTRAVENOUS
  Filled 2018-06-07: qty 50

## 2018-06-07 MED ORDER — SODIUM CHLORIDE 0.9 % IV SOLN
0.0000 mg/h | INTRAVENOUS | Status: DC
  Administered 2018-06-07: 5 mg/h via INTRAVENOUS
  Administered 2018-06-07: 0.5 mg/h via INTRAVENOUS
  Administered 2018-06-08 (×2): 8 mg/h via INTRAVENOUS
  Administered 2018-06-08: 7 mg/h via INTRAVENOUS
  Administered 2018-06-09: 8 mg/h via INTRAVENOUS
  Administered 2018-06-09: 10 mg/h via INTRAVENOUS
  Administered 2018-06-09: 8 mg/h via INTRAVENOUS
  Administered 2018-06-09 – 2018-06-10 (×3): 10 mg/h via INTRAVENOUS
  Administered 2018-06-10: 8 mg/h via INTRAVENOUS
  Administered 2018-06-10 – 2018-06-11 (×2): 10 mg/h via INTRAVENOUS
  Administered 2018-06-11: 8 mg/h via INTRAVENOUS
  Administered 2018-06-12 – 2018-06-13 (×3): 10 mg/h via INTRAVENOUS
  Filled 2018-06-07: qty 20
  Filled 2018-06-07 (×3): qty 10
  Filled 2018-06-07: qty 20
  Filled 2018-06-07 (×2): qty 10
  Filled 2018-06-07: qty 20
  Filled 2018-06-07: qty 10
  Filled 2018-06-07: qty 20
  Filled 2018-06-07 (×3): qty 10
  Filled 2018-06-07 (×3): qty 20
  Filled 2018-06-07: qty 10
  Filled 2018-06-07: qty 20

## 2018-06-07 MED ORDER — SODIUM CHLORIDE 0.9% IV SOLUTION
Freq: Once | INTRAVENOUS | Status: AC
  Administered 2018-06-07: 15:00:00 via INTRAVENOUS

## 2018-06-07 MED ORDER — CALCIUM GLUCONATE-NACL 2-0.675 GM/100ML-% IV SOLN
2.0000 g | Freq: Once | INTRAVENOUS | Status: AC
  Administered 2018-06-07: 2000 mg via INTRAVENOUS
  Filled 2018-06-07 (×2): qty 100

## 2018-06-07 MED ORDER — POTASSIUM CHLORIDE 10 MEQ/50ML IV SOLN
INTRAVENOUS | Status: AC
  Filled 2018-06-07: qty 50

## 2018-06-07 MED ORDER — FAMOTIDINE IN NACL 20-0.9 MG/50ML-% IV SOLN
20.0000 mg | INTRAVENOUS | Status: DC
  Administered 2018-06-08 – 2018-06-13 (×6): 20 mg via INTRAVENOUS
  Filled 2018-06-07 (×6): qty 50

## 2018-06-07 MED ORDER — LIDOCAINE HCL URETHRAL/MUCOSAL 2 % EX GEL
1.0000 "application " | Freq: Once | CUTANEOUS | Status: DC
  Filled 2018-06-07 (×2): qty 5

## 2018-06-07 MED ORDER — FUROSEMIDE 10 MG/ML IJ SOLN
80.0000 mg | Freq: Two times a day (BID) | INTRAMUSCULAR | Status: DC
  Administered 2018-06-07 (×2): 80 mg via INTRAVENOUS
  Filled 2018-06-07 (×2): qty 8

## 2018-06-07 MED ORDER — NOREPINEPHRINE 16 MG/250ML-% IV SOLN
0.0000 ug/min | INTRAVENOUS | Status: DC
  Administered 2018-06-07 (×2): 50 ug/min via INTRAVENOUS
  Administered 2018-06-08: 48 ug/min via INTRAVENOUS
  Administered 2018-06-08 (×3): 50 ug/min via INTRAVENOUS
  Administered 2018-06-09: 30 ug/min via INTRAVENOUS
  Administered 2018-06-09: 20 ug/min via INTRAVENOUS
  Administered 2018-06-10: 22 ug/min via INTRAVENOUS
  Administered 2018-06-10: 18 ug/min via INTRAVENOUS
  Administered 2018-06-11: 12 ug/min via INTRAVENOUS
  Administered 2018-06-13: 10 ug/min via INTRAVENOUS
  Administered 2018-06-15: 15 ug/min via INTRAVENOUS
  Filled 2018-06-07 (×15): qty 250

## 2018-06-07 MED ORDER — HEPARIN (PORCINE) IN NACL 100-0.45 UNIT/ML-% IJ SOLN
1950.0000 [IU]/h | INTRAMUSCULAR | Status: AC
  Administered 2018-06-07: 600 [IU]/h via INTRAVENOUS
  Administered 2018-06-08: 700 [IU]/h via INTRAVENOUS
  Administered 2018-06-10: 850 [IU]/h via INTRAVENOUS
  Administered 2018-06-11: 1000 [IU]/h via INTRAVENOUS
  Administered 2018-06-12 – 2018-06-13 (×3): 1150 [IU]/h via INTRAVENOUS
  Administered 2018-06-14: 1350 [IU]/h via INTRAVENOUS
  Administered 2018-06-14: 1250 [IU]/h via INTRAVENOUS
  Administered 2018-06-15: 1600 [IU]/h via INTRAVENOUS
  Administered 2018-06-16 – 2018-06-17 (×3): 1950 [IU]/h via INTRAVENOUS
  Filled 2018-06-07 (×11): qty 250

## 2018-06-07 MED ORDER — CALCIUM GLUCONATE-NACL 1-0.675 GM/50ML-% IV SOLN
1.0000 g | Freq: Once | INTRAVENOUS | Status: AC
  Administered 2018-06-07: 1000 mg via INTRAVENOUS
  Filled 2018-06-07: qty 50

## 2018-06-07 MED ORDER — DEXTROSE 50 % IV SOLN
INTRAVENOUS | Status: AC
  Administered 2018-06-07: 25 mL
  Filled 2018-06-07: qty 50

## 2018-06-07 NOTE — Progress Notes (Signed)
ANTICOAGULATION CONSULT NOTE   Pharmacy Consult for heparin Indication: suspected pulmonary embolus and confirmed DVT  No Known Allergies  Patient Measurements: Height: 5\' 10"  (177.8 cm) Weight: 181 lb (82.1 kg) IBW/kg (Calculated) : 73  Vital Signs: Temp: 96.3 F (35.7 C) (10/22 1545) Temp Source: Esophageal (10/22 1545) BP: 93/65 (10/22 1545) Pulse Rate: 95 (10/22 1545)  Labs: Recent Labs    06/06/2018 0439  06/12/2018 0854 05/24/2018 1138 05/26/2018 1404  06/14/2018 1800 06/15/2018 1935  06/06/18 0124 06/06/18 0807  06/06/18 1712 06/06/18 2106 06/07/18 0408 06/07/18 1234  HGB 11.6*   < > 10.9*  --   --    < >  --   --   --  8.5* 6.4*   < >  --  7.4* 9.1* 6.6*  HCT 39.6   < > 33.5*  --   --    < >  --   --   --  24.5* 18.9*   < >  --  21.5* 27.0* 19.2*  PLT 201  --  165  --   --    < >  --   --   --  168 124*   < >  --  79* 60* 55*  APTT 70*  --  >200* 174* 125*  --  72*  --   --   --   --   --   --   --   --   --   LABPROT 18.5*  --  >90.0*  --   --   --   --   --   --   --   --   --   --   --  21.9*  --   INR 1.56  --  >10.00*  --   --   --   --   --   --   --   --   --   --   --  1.95  --   HEPARINUNFRC  --   --   --   --   --   --   --   --    < > 0.41 0.72*  --  0.80*  --  0.74* 0.32  CREATININE 2.57*   < >  --   --   --    < >  --  3.67*  --  4.00* 4.18*   < >  --  4.53* 4.83* 5.23*  TROPONINI  --    < > 1.98*  --   --   --   --  24.95*  --  20.53* 11.98*  --   --   --   --   --    < > = values in this interval not displayed.    Estimated Creatinine Clearance: 13.4 mL/min (A) (by C-G formula based on SCr of 5.23 mg/dL (H)).   Medical History: Past Medical History:  Diagnosis Date  . ARF (acute renal failure) (HCC) 06/06/2018  . Hypertension   . RVF (right ventricular failure) (HCC) 06/06/2018  . Vitamin D deficiency     Assessment: 72yo male admitted on 10/20 after witnessed arrest w/ EMS then multiple arrests in ED. PMH includes known h/o PE/DVT, now off  anticoagulation at least 1 year. Reported by wife to have c/o calf pain on 10/19 >> concern for PE and given TNKase in ED on 10/20. Patient found to have DVT. No chest CT done to r/o PE given AKI.  Pharmacy has been  consulted for heparin dosing.  10/22: Heparin level this morning supratherapeutic at 0.72, and rate was decreased to 600 units/hr. Heparin infusion turned off d/t bleeding @ 1200 per CCM; with plan to restart tonight at 1800 if bleeding resolved. Continue targeting lower level 0.3-0.5 given recent bleeding from multiple sites (NGT/urinary catheter)  Goal of Therapy:  Heparin level 0.3-0.5 units/ml Monitor platelets by anticoagulation protocol: Yes   Plan:  Restart at current rate of 600 units/hr if no bleeding at 1800 Follow with heparin levels 8 hours after restarting Monitor daily heparin level and CBC Monitor for s/sx of bleeding  Thank you for involving pharmacy in this patient's care.  Wendelyn Breslow, PharmD PGY1 Pharmacy Resident Phone: [phone] 06/07/2018 4:05 PM

## 2018-06-07 NOTE — Progress Notes (Signed)
ANTICOAGULATION CONSULT NOTE - Follow Up Consult  Pharmacy Consult for heparin Indication: suspected pulmonary embolus and confirmed DVT  Labs: Recent Labs    05/29/2018 0439  06/09/2018 0854 05/29/2018 1138 06/04/2018 1404  06/10/2018 1800 05/26/2018 1935  06/06/18 0124 06/06/18 0807 06/06/18 1132 06/06/18 1506 06/06/18 1712 06/06/18 2106 06/07/18 0408  HGB 11.6*   < > 10.9*  --   --    < >  --   --   --  8.5* 6.4*  --  9.0*  --  7.4*  --   HCT 39.6   < > 33.5*  --   --    < >  --   --   --  24.5* 18.9*  --  26.1*  --  21.5*  --   PLT 201  --  165  --   --    < >  --   --   --  168 124*  --  93*  --  79*  --   APTT 70*  --  >200* 174* 125*  --  72*  --   --   --   --   --   --   --   --   --   LABPROT 18.5*  --  >90.0*  --   --   --   --   --   --   --   --   --   --   --   --  21.9*  INR 1.56  --  >10.00*  --   --   --   --   --   --   --   --   --   --   --   --  1.95  HEPARINUNFRC  --   --   --   --   --   --   --   --    < > 0.41 0.72*  --   --  0.80*  --  0.74*  CREATININE 2.57*   < >  --   --   --    < >  --  3.67*  --  4.00* 4.18* 4.23*  --   --  4.53*  --   TROPONINI  --    < > 1.98*  --   --   --   --  24.95*  --  20.53* 11.98*  --   --   --   --   --    < > = values in this interval not displayed.    Assessment: 72yo male remains supratherapeutic on heparin with continued bleeding issues from multiple sites.  Goal of Therapy:  Heparin level 0.3-0.5 units/ml   Plan:  Will hold heparin gtt x7min then resume heparin at lower rate of 600 units/hr and check level in 8 hours.    Vernard Gambles, PharmD, BCPS  06/07/2018,5:22 AM

## 2018-06-07 NOTE — Progress Notes (Signed)
eLink Physician-Brief Progress Note Patient Name: Bryan Parker DOB: 10-May-1946 MRN: [id number]   Date of Service  06/07/2018  HPI/Events of Note  Unable to reinsert foley catheter. INR >10 2 days ago.  Last PTT 0.8 o heparin drip. TPA >24 hours ago.  eICU Interventions  Recheck PT INR aPTT.  Call catheter team if patient not extremely coagulopathic to reinsert catheter before calling Dr Liliane Shi of urology back if still unsuccessful.     Intervention Category Intermediate Interventions: Bleeding - evaluation and treatment with blood products  Darl Pikes 06/07/2018, 4:12 AM

## 2018-06-07 NOTE — Progress Notes (Signed)
Inpatient Diabetes Program Recommendations  AACE/ADA: New Consensus Statement on Inpatient Glycemic Control (2019)  Target Ranges:  Prepandial:   less than 140 mg/dL      Peak postprandial:   less than 180 mg/dL (1-2 hours)      Critically ill patients:  140 - 180 mg/dL   Results for NEFI, MUSICH (MRN [id number]) as of 06/07/2018 07:40  Ref. Range 06/06/2018 06:18 06/06/2018 08:06 06/06/2018 09:09 06/06/2018 10:21 06/06/2018 11:32 06/06/2018 12:52 06/06/2018 14:11 06/06/2018 15:06 06/06/2018 16:07 06/06/2018 17:10 06/06/2018 18:14 06/06/2018 18:32 06/06/2018 19:42 06/06/2018 21:01 06/06/2018 22:00 06/07/2018 00:45 06/07/2018 03:20  Glucose-Capillary Latest Ref Range: 70 - 99 mg/dL 409 (H) 811 (H) 914 (H) 228 (H) 238 (H) 174 (H) 152 (H) 118 (H) 103 (H) 87 57 (L) 91 75 78 58 (L)  Levemir 5 @ 22:37 80 86   Review of Glycemic Control  Diabetes history: NO Outpatient Diabetes medications: NA Current orders for Inpatient glycemic control: Levemir 5 units Q12H, Novolog 1-3 units Q4H  Inpatient Diabetes Program Recommendations: Insulin - Basal: Please discontinue Levemir.  Thanks, Orlando Penner, RN, MSN, CDE Diabetes Coordinator Inpatient Diabetes Program [phone] (Team Pager from 8am to 5pm)

## 2018-06-07 NOTE — Progress Notes (Signed)
105 mL of Fentanyl wasted down sink with Sheila Oats, RN as witness.

## 2018-06-07 NOTE — Progress Notes (Signed)
NAME:  Bryan Parker, MRN:  [id number], DOB:  [birth year], LOS: 2 ADMISSION DATE:  06/25/18, CONSULTATION DATE:  06-25-18 REFERRING MD:  Dr. Rhunette Croft , CHIEF COMPLAINT:  Cardiac Arrest    Brief History   72 year old male presents to ED s/p Cardiac Arrest. Per Report patient called EMS for Abdominal Pain/Nausea/Vomiting. When EMS arrived patient was alert however went pulseless and PEA while on scene. ROSC achieved after 2 EPI and 10 minutes CPR. On arrival patient continued to be unstable and required multiple rounds of CPR. Fast exam negative. Bedside ECHO revealed slightly dilated RV. ABG 6.914/69.5/368. Progressive hypotension requiring Levophed/EPI/Vasopresin and 9 Bicarb pushes.   Wife reports that patient was complaining of severe left calf pain all day 10/19. Has a history of PE 2016. Was taken off anticoagulation 2018. Due to high suspicion of PE TNKase was given in ED. PCCM asked to admit.   Past Medical History  H/O DVT/PE, CKD  Significant Hospital Events   10/20 > Presents to ED  1021 with a partial code no CPR  Consults: date of consult/date signed off & final recs:  PCCM 10/20  Procedures (surgical and bedside):  ETT 10/20 >> Right Femoral CVC 10/20 >> Left Femoral Aline 10/20 >>   Significant Diagnostic Tests:  CXR 10/20 > Endotracheal tube tip is just below the clavicular heads approximately 2 cm above the inferior margin of the carina. The enteric tube tip and side port are below the field of view. The lungs are clear. Normal cardiomediastinum. CT Head 10/20 >> neg  Micro Data:  Blood 10/20 >> Sputum 10/20 >>  U/A 10/20 >>   Antimicrobials:   zosyn 10/20 >> vanc 10/20 >>  Subjective:  Critical ill on maximum vasopressor support Full support On nitric oxide  Objective   Blood pressure (!) 88/42, pulse (!) 110, temperature (!) 95.9 F (35.5 C), resp. rate (!) 27, height 5\' 10"  (1.778 m), weight 82.1 kg, SpO2 97 %. CVP:  [8 mmHg-12 mmHg] 12 mmHg    Vent Mode: PRVC FiO2 (%):  [40 %] 40 % Set Rate:  [18 bmp-24 bmp] 18 bmp Vt Set:  [500 mL-580 mL] 500 mL PEEP:  [5 cmH20] 5 cmH20 Plateau Pressure:  [14 cmH20-20 cmH20] 16 cmH20   Intake/Output Summary (Last 24 hours) at 06/07/2018 1016 Last data filed at 06/07/2018 0600 Gross per 24 hour  Intake 5337.88 ml  Output 415 ml  Net 4922.88 ml   Filed Weights   06/25/2018 0800 06/06/18 0430 06/07/18 0630  Weight: 66.5 kg 72.8 kg 82.1 kg    Examination: Acutely ill, appears younger than stated age, on vent Sedated on fentanyl, but wakes up easily and follows commands, RA SS -2 Soft mildly distended abdomen, absent bowel sounds S1-S2 tacky Decreased breath sounds bilateral Mild pallor, no icterus, no JVD Right femoral central line, left femoral arterial line, interosseous in place   06/07/2018 chest x-ray reviewed endotracheal tube in adequate position, orogastric to pass through, suspect left pleural effusion and atelectasis both bases  Resolved Hospital Problem list     Assessment & Plan:   Acute Hypercarbic Respiratory Failure Plan  Maintain full vent support Nitric continues Normal  PEA Cardiac Arrest  - 10-minute downtime -Arouses and follows commands -Maxed on vasopressor support  Suspected Obstructive Shock due to PE , acute cor pulmonale -Given TNKase in ED LLE DVT H/O PE/DVT 2016 (not on anticoagulation)  Plan  -Continue pressor support -Cardiology is following -ct  Heparin gtt per pharmacy  Sepsis-no clear source,?  Aspiration pneumonia note high procalcitonin. -Continue antibiotics -Monitor culture data   AKI on Chronic Kidney Failure  Severe Anion Gap Metabolic Acidosis with Lactic Acidosis  Hyponatremia Lab Results  Component Value Date   CREATININE 4.83 (H) 06/07/2018   CREATININE 4.53 (H) 06/06/2018   CREATININE 4.23 (H) 06/06/2018   Recent Labs  Lab 06/06/18 1132 06/06/18 2106 06/07/18 0408  K 3.0* 3.1* 4.7   Recent Labs  Lab  06/06/18 1132 06/06/18 2106 06/07/18 0408  NA 134* 133* 134*    Plan  -Dr. electrolyte -Off bicarbonate drip  Acute blood loss anemia Recent Labs    06/06/18 2106 06/07/18 0408  HGB 7.4* 9.1*    -Posttransfusion hemoglobin now 9.1 -Monitor for bleeding  Hyperglycemia  CBG (last 3)  Recent Labs    06/07/18 0320 06/07/18 0811 06/07/18 0839  GLUCAP 86 59* 136*    Plan  -Drip change normal saline from D5 W with trend of hypoglycemia  Acute Encephalopathy  -1020 2232 follows commands -Frequent neurochecks   Disposition / Summary of Today's Plan 06/07/18    He is now a partial code no CPR Remains on maxed out vasopressors Prognosis is extremely grim Dr. Vassie Loll is meeting with family.       Code Status: 06/07/2018 partial code no CPR Family Communication: 06/07/2018 family updated by MD  Labs   CBC: Recent Labs  Lab 06/12/2018 0439  06/06/18 0124 06/06/18 0807 06/06/18 1506 06/06/18 2106 06/07/18 0408  WBC 8.9   < > 30.5* 29.5* 27.2* 27.5* 25.6*  NEUTROABS 2.6  --   --   --   --   --   --   HGB 11.6*   < > 8.5* 6.4* 9.0* 7.4* 9.1*  HCT 39.6   < > 24.5* 18.9* 26.1* 21.5* 27.0*  MCV 99.7   < > 87.2 88.3 80.3 79.9* 84.4  PLT 201   < > 168 124* 93* 79* 60*   < > = values in this interval not displayed.    Basic Metabolic Panel: Recent Labs  Lab 06/10/2018 0439  06/06/18 0124 06/06/18 0807 06/06/18 1132 06/06/18 2106 06/07/18 0408  NA 141   < > 134* 133* 134* 133* 134*  K 4.1   < > 2.8* 2.9* 3.0* 3.1* 4.7  CL 102   < > 99 101 101 104 103  CO2 16*   < > 21* 21* 20* 19* 17*  GLUCOSE 350*   < > 380* 329* 235* 87 77  BUN 23   < > 33* 35* 35* 35* 36*  CREATININE 2.57*   < > 4.00* 4.18* 4.23* 4.53* 4.83*  CALCIUM 8.8*   < > 6.5* 5.4* 5.3* 5.1* 5.3*  MG 3.0*  --  1.4*  --   --   --  1.7  PHOS 11.3*  --  1.1*  --   --   --  6.7*   < > = values in this interval not displayed.   GFR: Estimated Creatinine Clearance: 14.5 mL/min (A) (by C-G formula  based on SCr of 4.83 mg/dL (H)). Recent Labs  Lab 06/03/2018 0854 06/16/2018 1515 05/18/2018 1929 06/03/2018 2229 06/06/18 0124 06/06/18 0414 06/06/18 0807 06/06/18 1506 06/06/18 2106 06/07/18 0408  PROCALCITON 0.38  --   --   --  >150.00  --   --   --   --  >150.00  WBC 18.4* 23.4*  --   --  30.5*  --  29.5* 27.2* 27.5* 25.6*  LATICACIDVEN 18.2* 12.4* 10.0* 9.9*  --  6.5*  --   --   --   --     Liver Function Tests: Recent Labs  Lab 05/25/2018 0439 05/28/2018 1515 06/06/18 0807 06/07/18 0812  AST 181* 1,122* 485* 271*  ALT 141* 549* 266* 166*  ALKPHOS 138* 136* 62 45  BILITOT 0.6 1.2 1.0 2.0*  PROT 6.0* 4.0* <3.0* <3.0*  ALBUMIN 3.2* 2.2* 1.5* 1.4*   No results for input(s): LIPASE, AMYLASE in the last 168 hours. No results for input(s): AMMONIA in the last 168 hours.  ABG    Component Value Date/Time   PHART 7.357 06/07/2018 0447   PCO2ART 31.0 (L) 06/07/2018 0447   PO2ART 71.0 (L) 06/07/2018 0447   HCO3 17.6 (L) 06/07/2018 0447   TCO2 19 (L) 06/07/2018 0447   ACIDBASEDEF 7.0 (H) 06/07/2018 0447   O2SAT 95.0 06/07/2018 0447     Coagulation Profile: Recent Labs  Lab 06/04/2018 0439 05/31/2018 0854 06/07/18 0408  INR 1.56 >10.00* 1.95    Cardiac Enzymes: Recent Labs  Lab 06/07/2018 0854 06/10/2018 1935 06/06/18 0124 06/06/18 0807  TROPONINI 1.98* 24.95* 20.53* 11.98*    HbA1C: No results found for: HGBA1C  CBG: Recent Labs  Lab 06/06/18 2236 06/07/18 0045 06/07/18 0320 06/07/18 0811 06/07/18 0839  GLUCAP 97 80 86 59* 136*    App cct 45 min  Brett Canales Juma Oxley ACNP [facility] Pager [phone] till 1 pm If no answer page [phone] 06/07/2018, 10:16 AM

## 2018-06-07 NOTE — Progress Notes (Signed)
72 year old man admitted 10/20 post cardiac arrest with PEA received TNKase in the ED due to bedside echo showing dilated RV.  He has a history of prior PE and anticoagulation was discontinued in 2018.  Duplex showed left popliteal DVT Unfortunately he remains in severe cor pulmonale on high-dose epinephrine, Levophed, vasopressin and dobutamine.  Overnight-had bleeding from urinary catheter, ultrasound showed urinary retention, catheter could not be replaced and ultimately placed by coud team but still not draining.  He has been transfused 4 units PRBC for drop in hemoglobin and appears to be somewhat volume responsive.  On exam-mildly agitated, fentanyl drip has been turned off, remains on max pressors, cool extremities with dopplerable pulses, soft distended abdomen, decreased breath sounds bilateral, S1-S2 tacky.  Labs reviewed which show surprisingly high procalcitonin, worsening creatinine, leukocytosis, drop in platelets to 60.  Chest x-ray shows bilateral effusions and perhaps left lower lobe infiltrate.  Impression/plan  Obstructive shock Acute cor pulmonale due to presumed PE-on max pressors and dobutamine, status post thrombolytic Have discussed options with cardiology including RVAD & he is not a candidate.  Doubt he is a candidate for IR thrombectomy at this point.  AKI -catheter placed by coude team, flush, Lasix tried by cardiology Not a candidate for dialysis  Acute respiratory failure-related to a bove  Detailed discussion with wife and brothers-since he is wide-awake, they would like to push on for another 24 hours unless he has worsening organ failure in which case they would be amenable to comfort. We will change him to Versed drip for comfort for now since fentanyl is not holding him.  My critical care time x 54m  Cyril Mourning MD. FCCP. [facility] Pager [phone] If no response call [phone]   06/07/2018

## 2018-06-07 NOTE — Progress Notes (Signed)
CRITICAL VALUE ALERT  Critical Value: Calcium 5.1  Date & Time Notied: 2228  Provider Notified: E. Aventura MD  Orders Received/Actions taken: give 1g calcium gluconate via IV

## 2018-06-07 NOTE — Progress Notes (Signed)
Pt in puddle of blood thought to be from penis. Foley catheter was flushed, and it was noted blood coming from around foley. Pt was then bladder scanned and it showed pt had greater than 500cc's in the bladder. MD Aventura notified and gave verbal order to exchange foley catheter. Will continue to monitor pt closely during this shift.

## 2018-06-07 NOTE — Progress Notes (Signed)
At 1130, this RN entered patient's room and saw bright red blood output from foley catheter. Notified Alva MD and was ordered to stop heparin drip for two hours and re-check heparin level.   This RN notified of critical lab values, calcium level of 5.4 and hemoglobin of 6.6. This RN notified Alva MD at 1400 of these values as well as platelet level of 55K, which has trended down from 93K in the last 24 hours. Vassie Loll MD also notified that patient has been agitated and attempting to pull out ET tube despite being on max. dose (5 mg) of ordered versed drip. Vassie Loll MD verbalized that he will put in orders for non-violent restraints, calcium replacement, and blood transfusion. Will implement orders placed and continue to monitor patient.

## 2018-06-07 NOTE — Progress Notes (Signed)
CRITICAL VALUE ALERT  Critical Value:  Lactic Acid 2.8   Date & Time Notied: 06/07/2018 1245  Provider Notified: Cyril Mourning MD  Orders Received/Actions taken: No orders at this time.

## 2018-06-07 NOTE — Progress Notes (Addendum)
Advanced Heart Failure Rounding Note  PCP-Cardiologist: Fransico Him, MD   Subjective:    Received 2 more units PRBCs overnight. Hemoglobin 7.4 > 9.1. Lots of bleeding from urethra with 500 cc on bladder scan. Unable to replace foley. INR 1.95  Ca 5.3, mag 1.7.   Creatinine 4.83. No UOP. CVP 10-11  WBC 25.6. Afebrile, but lowest 95.7. PCT >150 yesterday. Remains on Vanc and Zosyn. Sputum + mod gram positive cocci. CXR with left-sided infiltrate.   MAPs 60s, SBP in 90s on Norepi 50, Epi 20, dobutamine 20, vaso 0.03. CVP 10-11  Remains intubated and sedated. Awakens easily. FiO2 40%. On iNO 30 ppm.  ABG: pH 7.36, pCO2 31, pO2 71, Bicarb 18  Objective:   Weight Range: 82.1 kg Body mass index is 25.97 kg/m.   Vital Signs:   Temp:  [95.7 F (35.4 C)-99 F (37.2 C)] 95.9 F (35.5 C) (10/22 0700) Pulse Rate:  [90-131] 109 (10/22 0700) Resp:  [15-36] 15 (10/22 0700) BP: (64-126)/(48-81) 93/55 (10/22 0700) SpO2:  [93 %-100 %] 99 % (10/22 0700) Arterial Line BP: (63-116)/(32-63) 88/45 (10/22 0700) FiO2 (%):  [40 %] 40 % (10/22 0327) Weight:  [82.1 kg] 82.1 kg (10/22 0630) Last BM Date: 06/06/18  Weight change: Filed Weights   06/07/2018 0800 06/06/18 0430 06/07/18 0630  Weight: 66.5 kg 72.8 kg 82.1 kg    Intake/Output:   Intake/Output Summary (Last 24 hours) at 06/07/2018 0724 Last data filed at 06/07/2018 0400 Gross per 24 hour  Intake 4508.62 ml  Output 365 ml  Net 4143.62 ml      Physical Exam    General: Intubated/sedated.  HEENT: + ETT + OGT Neck: Supple. JVP ~8. Carotids 2+ bilat; no bruits. No thyromegaly or nodule noted. Cor: PMI nondisplaced. Tachy, RR, No M/G/R noted. Right groin TLC.  Lungs: Diminished basilar sounds Abdomen: Soft, non-tender, non-distended, no HSM. No bruits or masses. +BS  Extremities: No cyanosis, clubbing, or rash. 2+ edema into thighs. Warm. Neuro: Intubated/sedated  Telemetry   Sinus tach 100-110s. Personally  reviewed.   EKG    No new tracings.   Labs    CBC Recent Labs    06/04/2018 0439  06/06/18 2106 06/07/18 0408  WBC 8.9   < > 27.5* 25.6*  NEUTROABS 2.6  --   --   --   HGB 11.6*   < > 7.4* 9.1*  HCT 39.6   < > 21.5* 27.0*  MCV 99.7   < > 79.9* 84.4  PLT 201   < > 79* 60*   < > = values in this interval not displayed.   Basic Metabolic Panel Recent Labs    06/06/18 0124  06/06/18 2106 06/07/18 0408  NA 134*   < > 133* 134*  K 2.8*   < > 3.1* 4.7  CL 99   < > 104 103  CO2 21*   < > 19* 17*  GLUCOSE 380*   < > 87 77  BUN 33*   < > 35* 36*  CREATININE 4.00*   < > 4.53* 4.83*  CALCIUM 6.5*   < > 5.1* 5.3*  MG 1.4*  --   --  1.7  PHOS 1.1*  --   --  6.7*   < > = values in this interval not displayed.   Liver Function Tests Recent Labs    05/25/2018 1515 06/06/18 0807  AST 1,122* 485*  ALT 549* 266*  ALKPHOS 136* 62  BILITOT 1.2 1.0  PROT 4.0* <3.0*  ALBUMIN 2.2* 1.5*   No results for input(s): LIPASE, AMYLASE in the last 72 hours. Cardiac Enzymes Recent Labs    05/21/2018 1935 06/06/18 0124 06/06/18 0807  TROPONINI 24.95* 20.53* 11.98*    BNP: BNP (last 3 results) Recent Labs    06/07/2018 0854  BNP 18.0    ProBNP (last 3 results) No results for input(s): PROBNP in the last 8760 hours.   D-Dimer No results for input(s): DDIMER in the last 72 hours. Hemoglobin A1C No results for input(s): HGBA1C in the last 72 hours. Fasting Lipid Panel No results for input(s): CHOL, HDL, LDLCALC, TRIG, CHOLHDL, LDLDIRECT in the last 72 hours. Thyroid Function Tests No results for input(s): TSH, T4TOTAL, T3FREE, THYROIDAB in the last 72 hours.  Invalid input(s): FREET3  Other results:   Imaging    Dg Abd Portable 1v  Result Date: 06/06/2018 CLINICAL DATA:  72 year old male status post gastric tube placement EXAM: PORTABLE ABDOMEN - 1 VIEW COMPARISON:  Chest x-ray, yesterday 06/11/2018 FINDINGS: A gastric tube is present. The tip lies over the gastric body  in good position. External defibrillator pads project over the left chest. A right femoral approach central venous catheter is present. The tip projects over the right sacral ala, presumably within the right common iliac vein. Mild linear atelectasis in the lung bases. No evidence of free air or bowel obstruction. Lower lumbar degenerative disc disease at L4-L5 and L5-S1. IMPRESSION: Well-positioned gastric tube with the tip overlying the gastric body. Right femoral approach central venous catheter. The tip overlies the expected location of the right common iliac vein. Electronically Signed   By: Jacqulynn Cadet M.D.   On: 06/06/2018 13:13      Medications:     Scheduled Medications: . chlorhexidine gluconate (MEDLINE KIT)  15 mL Mouth Rinse BID  . Chlorhexidine Gluconate Cloth  6 each Topical Daily  . insulin aspart  1-3 Units Subcutaneous Q4H  . insulin detemir  5 Units Subcutaneous Q12H  . mouth rinse  15 mL Mouth Rinse 10 times per day  . sodium chloride flush  10-40 mL Intracatheter Q12H  . THROMBI-PAD  1 each Topical Once     Infusions: . sodium chloride 50 mL/hr at 06/06/18 2010  . DOBUTamine 20 mcg/kg/min (06/07/18 0455)  . epinephrine 20 mcg/min (06/07/18 0653)  . famotidine (PEPCID) IV 20 mg (06/06/18 2154)  . fentaNYL infusion INTRAVENOUS 100 mcg/hr (06/07/18 0405)  . heparin 600 Units/hr (06/07/18 0550)  . norepinephrine (LEVOPHED) 16 mg in sodium chloride 0.9 % 250 mL infusion 37.5 mL/hr at 06/07/18 0416  . piperacillin-tazobactam (ZOSYN)  IV 3.375 g (06/06/18 2014)  . sodium chloride    . vancomycin 750 mg (06/06/18 2013)  . vasopressin (PITRESSIN) infusion - *FOR SHOCK* 0.03 Units/min (06/07/18 0025)     PRN Medications:  fentaNYL (SUBLIMAZE) injection, fentaNYL (SUBLIMAZE) injection, midazolam, sodium chloride flush    Patient Profile   Bryan Parker is a 72 y.o. male with history of HTN, DVT/PE March 2016 NOT on chronic AC, CKD, and prior tobacco  use.  Presented to [facility] after PEA arrest with 10+ minutes down. Now with RV failure.  Assessment/Plan   1. 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  - Echo 10/2014: EF 60%, RV normal, PA peak pressure 73 mmHg - Echo 06/09/2018: EF 55-60%, RV severely dilated, moderate TR, moderately dilated RA, PA peak pressure 31 mmHg - CVP 10-11 - On dobutamine 20, NE 50, epi 20,  vaso 0.03 with MAPs 60s - Continue iNO 30 ppm - Consider RHC when more stable.  - Add low dose dopamine if needed.  - Consider bicarb drip if pH drops more. Bicarb low, but pH 7.35. May respond better to pressors.  - Hold off on RV impella with bleeding and extensive clot.   2. PEA arrest - ?secondary to massive PE - Received 10+ minutes CPR. Head CT negative. Seems to be neuro intact.  3. Presumed massive PE - Has hx of PE in 2016, but has not been on chronic anticoagulation (off >1 year) - BLE dopplers positive for left popliteal acute DVT  - Received TNKase in ED. Now on heparin drip  4. ID - WBC 29.5 > 25.6. Afebrile. Temp running low.  - Respiratory culture: moderate gram positive cocci in pairs and in chains.  - Blood cultures 10/20 NGTD.  - On Zosyn and Vanc. - Consider stress dose steroids.   5. AKI on CKD - Baseline creatinine looks to be 1.6-2.2 range - Creatinine trending up. 4.83 today. No UOP. Heading toward CRRT.  - Renal US negative for hydronephrosis  6. Anemia - Hemoglobin 8.5 > 6.4 > 2 units PRBCs > 9.0 > 7.4 > 2 units PRBCs > 9.1 - Received TNKase and now on heparin drip.  - Bleeding from urethra. Less bloody drainage from OGT  7. Elevated liver enzymes - In setting of shock. Trend.  8. Hypocalcemia - Ca++ 5.3. Will supp.   9. Acute respiratory failure - Remains intubated. FiO2 40% - ABG: pH 7.36, pCO2 31, pO2 71, Bicarb 18  10. Limited code - No CPR.  Medication concerns reviewed with patient and pharmacy team. Barriers identified: unable to determine  at this time.    Length of Stay: [place], NP  06/07/2018, 7:24 AM  Advanced Heart Failure Team Pager [phone] (M-F; 7a - 4p)  Please contact [facility] for night-coverage after hours (4p -7a ) and weekends on [email or web address]  Patient seen with NP, agree with the above note.   This morning, he remains on high dose epinephrine, dobutamine, norepinephrine as well as vasopressin. SBP better in 90s.  He awakens and follows commands.  He is on heparin gtt.  Still with bloody urine but bleeding in NGT appears to have slowed.  Hgb up to 9 with transfusions yesterday.   Afebrile but PCT > 150 with left-sided infiltrate.  He is on vancomycin/Zosyn.   HCO3 low at 17, pH ok today at 7.35.   He is in NSR on telemetry.   On exam, he awakens and follows commands. JVP elevated. Decreased breath sounds on left.  Mildly tachy, regular rhythm.   SBP improved today in 90s.  He is neurologically intact. Continue current pressor/inotrope support with hope that RV will recover with time.  With bleeding yesterday and suspected extensive clot (DVT/PE), we decided against placing RV Impella.  - Continue current pressors + dobutamine.  Can add low dose dopamine if needed.  - If HCO3 falls further, may need HCO3 infusion.  - Continue heparin gtt.   Suspect HCAP, possibly due to aspiration.  PCT > 150 and now on empiric vancomycin/Zosyn.   Bleeding seems to have stabilized (post-TPA). Appropriate increase in hgb.   Needs foley replaced, will likely need coudet.   Creatinine remains elevated at 4.8 with poor UOP.  Will give IV Lasix today after he gets foley.  CVVH would be very difficult here with marginal BP.   CRITICAL CARE  Performed by: Loralie Champagne  Total critical care time: 35 minutes  Critical care time was exclusive of separately billable procedures and treating other patients.  Critical care was necessary to treat or prevent imminent or life-threatening deterioration.  Critical  care was time spent personally by me on the following activities: development of treatment plan with patient and/or surrogate as well as nursing, discussions with consultants, evaluation of patient's response to treatment, examination of patient, obtaining history from patient or surrogate, ordering and performing treatments and interventions, ordering and review of laboratory studies, ordering and review of radiographic studies, pulse oximetry and re-evaluation of patient's condition.  Loralie Champagne 06/07/2018 8:41 AM

## 2018-06-07 NOTE — Progress Notes (Signed)
Hypoglycemic Event  CBG: 59 at 0811  Treatment: 25 mL IV D50 per Hypoglycemia Standing Orders  Symptoms: None  Follow-up CBG: Time: 0839 CBG Result: 136  Possible Reasons for Event: Critically ill patient, NPO, transitioned off of insulin drip last night.  Comments/MD notified: IV D50 effective, will continue to monitor patient.    Bryan Parker

## 2018-06-08 ENCOUNTER — Inpatient Hospital Stay (HOSPITAL_COMMUNITY): Payer: Medicare Other

## 2018-06-08 LAB — POCT I-STAT 3, ART BLOOD GAS (G3+)
ACID-BASE DEFICIT: 7 mmol/L — AB (ref 0.0–2.0)
Acid-base deficit: 7 mmol/L — ABNORMAL HIGH (ref 0.0–2.0)
Bicarbonate: 15.7 mmol/L — ABNORMAL LOW (ref 20.0–28.0)
Bicarbonate: 16.2 mmol/L — ABNORMAL LOW (ref 20.0–28.0)
O2 SAT: 98 %
O2 SAT: 98 %
PCO2 ART: 24 mmHg — AB (ref 32.0–48.0)
PH ART: 7.429 (ref 7.350–7.450)
PH ART: 7.452 — AB (ref 7.350–7.450)
PO2 ART: 94 mmHg (ref 83.0–108.0)
TCO2: 16 mmol/L — AB (ref 22–32)
TCO2: 17 mmol/L — ABNORMAL LOW (ref 22–32)
pCO2 arterial: 23 mmHg — ABNORMAL LOW (ref 32.0–48.0)
pO2, Arterial: 104 mmHg (ref 83.0–108.0)

## 2018-06-08 LAB — COMPREHENSIVE METABOLIC PANEL
ALK PHOS: 48 U/L (ref 38–126)
ALT: 145 U/L — ABNORMAL HIGH (ref 0–44)
ANION GAP: 11 (ref 5–15)
AST: 279 U/L — ABNORMAL HIGH (ref 15–41)
Albumin: 1.4 g/dL — ABNORMAL LOW (ref 3.5–5.0)
BILIRUBIN TOTAL: 2.3 mg/dL — AB (ref 0.3–1.2)
BUN: 49 mg/dL — ABNORMAL HIGH (ref 8–23)
CALCIUM: 5.1 mg/dL — AB (ref 8.9–10.3)
CO2: 16 mmol/L — AB (ref 22–32)
Chloride: 107 mmol/L (ref 98–111)
Creatinine, Ser: 5.8 mg/dL — ABNORMAL HIGH (ref 0.61–1.24)
GFR calc non Af Amer: 9 mL/min — ABNORMAL LOW (ref >60)
GFR, EST AFRICAN AMERICAN: 10 mL/min — AB (ref >60)
Glucose, Bld: 124 mg/dL — ABNORMAL HIGH (ref 70–99)
Potassium: 6.4 mmol/L (ref 3.5–5.1)
SODIUM: 134 mmol/L — AB (ref 135–145)
Total Protein: 3.3 g/dL — ABNORMAL LOW (ref 6.5–8.1)

## 2018-06-08 LAB — GLUCOSE, CAPILLARY
GLUCOSE-CAPILLARY: 115 mg/dL — AB (ref 70–99)
GLUCOSE-CAPILLARY: 105 mg/dL — AB (ref 70–99)
GLUCOSE-CAPILLARY: 98 mg/dL (ref 70–99)
Glucose-Capillary: 106 mg/dL — ABNORMAL HIGH (ref 70–99)
Glucose-Capillary: 154 mg/dL — ABNORMAL HIGH (ref 70–99)
Glucose-Capillary: 76 mg/dL (ref 70–99)

## 2018-06-08 LAB — LIPASE, BLOOD: Lipase: 23 U/L (ref 11–51)

## 2018-06-08 LAB — CBC WITH DIFFERENTIAL/PLATELET
Abs Immature Granulocytes: 0.22 10*3/uL — ABNORMAL HIGH (ref 0.00–0.07)
BASOS ABS: 0 10*3/uL (ref 0.0–0.1)
Basophils Relative: 0 %
EOS ABS: 7.4 10*3/uL — AB (ref 0.0–0.5)
EOS PCT: 29 %
HEMATOCRIT: 22.7 % — AB (ref 39.0–52.0)
Hemoglobin: 8 g/dL — ABNORMAL LOW (ref 13.0–17.0)
Immature Granulocytes: 1 %
LYMPHS ABS: 1.1 10*3/uL (ref 0.7–4.0)
Lymphocytes Relative: 4 %
MCH: 28.7 pg (ref 26.0–34.0)
MCHC: 35.2 g/dL (ref 30.0–36.0)
MCV: 81.4 fL (ref 80.0–100.0)
MONOS PCT: 4 %
Monocytes Absolute: 1.1 10*3/uL — ABNORMAL HIGH (ref 0.1–1.0)
NEUTROS PCT: 62 %
NRBC: 0.2 % (ref 0.0–0.2)
Neutro Abs: 16.1 10*3/uL — ABNORMAL HIGH (ref 1.7–7.7)
Platelets: 56 10*3/uL — ABNORMAL LOW (ref 150–400)
RBC: 2.79 MIL/uL — ABNORMAL LOW (ref 4.22–5.81)
RDW: 15.7 % — ABNORMAL HIGH (ref 11.5–15.5)
WBC: 26 10*3/uL — ABNORMAL HIGH (ref 4.0–10.5)

## 2018-06-08 LAB — BASIC METABOLIC PANEL
Anion gap: 9 (ref 5–15)
BUN: 46 mg/dL — ABNORMAL HIGH (ref 8–23)
CALCIUM: 4.9 mg/dL — AB (ref 8.9–10.3)
CHLORIDE: 111 mmol/L (ref 98–111)
CO2: 16 mmol/L — AB (ref 22–32)
CREATININE: 5.31 mg/dL — AB (ref 0.61–1.24)
GFR calc non Af Amer: 10 mL/min — ABNORMAL LOW (ref >60)
GFR, EST AFRICAN AMERICAN: 11 mL/min — AB (ref >60)
Glucose, Bld: 76 mg/dL (ref 70–99)
Potassium: 5.6 mmol/L — ABNORMAL HIGH (ref 3.5–5.1)
SODIUM: 136 mmol/L (ref 135–145)

## 2018-06-08 LAB — VANCOMYCIN, RANDOM: VANCOMYCIN RM: 22

## 2018-06-08 LAB — HEPARIN LEVEL (UNFRACTIONATED)
HEPARIN UNFRACTIONATED: 0.14 [IU]/mL — AB (ref 0.30–0.70)
HEPARIN UNFRACTIONATED: 0.25 [IU]/mL — AB (ref 0.30–0.70)
Heparin Unfractionated: 0.12 IU/mL — ABNORMAL LOW (ref 0.30–0.70)

## 2018-06-08 LAB — RENAL FUNCTION PANEL
ANION GAP: 10 (ref 5–15)
Albumin: 1.5 g/dL — ABNORMAL LOW (ref 3.5–5.0)
BUN: 44 mg/dL — ABNORMAL HIGH (ref 8–23)
CHLORIDE: 104 mmol/L (ref 98–111)
CO2: 19 mmol/L — ABNORMAL LOW (ref 22–32)
Calcium: 5.6 mg/dL — CL (ref 8.9–10.3)
Creatinine, Ser: 4.96 mg/dL — ABNORMAL HIGH (ref 0.61–1.24)
GFR, EST AFRICAN AMERICAN: 12 mL/min — AB (ref >60)
GFR, EST NON AFRICAN AMERICAN: 11 mL/min — AB (ref >60)
Glucose, Bld: 123 mg/dL — ABNORMAL HIGH (ref 70–99)
POTASSIUM: 6 mmol/L — AB (ref 3.5–5.1)
Phosphorus: 6.8 mg/dL — ABNORMAL HIGH (ref 2.5–4.6)
Sodium: 133 mmol/L — ABNORMAL LOW (ref 135–145)

## 2018-06-08 LAB — CBC
HEMATOCRIT: 18.8 % — AB (ref 39.0–52.0)
HEMOGLOBIN: 6.8 g/dL — AB (ref 13.0–17.0)
MCH: 29.6 pg (ref 26.0–34.0)
MCHC: 36.2 g/dL — ABNORMAL HIGH (ref 30.0–36.0)
MCV: 81.7 fL (ref 80.0–100.0)
Platelets: 56 10*3/uL — ABNORMAL LOW (ref 150–400)
RBC: 2.3 MIL/uL — AB (ref 4.22–5.81)
RDW: 17 % — ABNORMAL HIGH (ref 11.5–15.5)
WBC: 26.8 10*3/uL — ABNORMAL HIGH (ref 4.0–10.5)
nRBC: 0.2 % (ref 0.0–0.2)

## 2018-06-08 LAB — PREPARE RBC (CROSSMATCH)

## 2018-06-08 LAB — CK TOTAL AND CKMB (NOT AT ARMC)
CK, MB: 135.4 ng/mL — ABNORMAL HIGH (ref 0.5–5.0)
Relative Index: 0.9 (ref 0.0–2.5)
Total CK: 14417 U/L — ABNORMAL HIGH (ref 49–397)

## 2018-06-08 LAB — LACTIC ACID, PLASMA: LACTIC ACID, VENOUS: 2.1 mmol/L — AB (ref 0.5–1.9)

## 2018-06-08 LAB — MAGNESIUM: Magnesium: 1.9 mg/dL (ref 1.7–2.4)

## 2018-06-08 LAB — PHOSPHORUS: Phosphorus: 6.8 mg/dL — ABNORMAL HIGH (ref 2.5–4.6)

## 2018-06-08 MED ORDER — PIPERACILLIN-TAZOBACTAM IN DEX 2-0.25 GM/50ML IV SOLN
2.2500 g | Freq: Four times a day (QID) | INTRAVENOUS | Status: DC
  Administered 2018-06-08 – 2018-06-11 (×12): 2.25 g via INTRAVENOUS
  Filled 2018-06-08 (×13): qty 50

## 2018-06-08 MED ORDER — HEPARIN SODIUM (PORCINE) 1000 UNIT/ML DIALYSIS
1000.0000 [IU] | INTRAMUSCULAR | Status: DC | PRN
  Administered 2018-06-08: 2400 [IU] via INTRAVENOUS_CENTRAL
  Filled 2018-06-08: qty 6
  Filled 2018-06-08: qty 3
  Filled 2018-06-08 (×2): qty 6

## 2018-06-08 MED ORDER — SODIUM BICARBONATE 8.4 % IV SOLN
50.0000 meq | Freq: Once | INTRAVENOUS | Status: AC
  Administered 2018-06-08: 50 meq via INTRAVENOUS
  Filled 2018-06-08: qty 50

## 2018-06-08 MED ORDER — FUROSEMIDE 10 MG/ML IJ SOLN
160.0000 mg | Freq: Two times a day (BID) | INTRAVENOUS | Status: DC
  Administered 2018-06-08: 160 mg via INTRAVENOUS
  Filled 2018-06-08 (×2): qty 16

## 2018-06-08 MED ORDER — SODIUM CHLORIDE 0.9% IV SOLUTION
Freq: Once | INTRAVENOUS | Status: AC
  Administered 2018-06-08: 02:00:00 via INTRAVENOUS

## 2018-06-08 MED ORDER — VANCOMYCIN HCL IN DEXTROSE 1-5 GM/200ML-% IV SOLN
1000.0000 mg | INTRAVENOUS | Status: DC
  Administered 2018-06-09 – 2018-06-10 (×2): 1000 mg via INTRAVENOUS
  Filled 2018-06-08 (×3): qty 200

## 2018-06-08 MED ORDER — PRISMASOL BGK 4/2.5 32-4-2.5 MEQ/L IV SOLN
INTRAVENOUS | Status: DC
  Administered 2018-06-08 – 2018-06-18 (×14): via INTRAVENOUS_CENTRAL
  Filled 2018-06-08 (×18): qty 5000

## 2018-06-08 MED ORDER — PRISMASOL BGK 0/2.5 32-2.5 MEQ/L IV SOLN
INTRAVENOUS | Status: DC
  Administered 2018-06-08 – 2018-06-10 (×11): via INTRAVENOUS_CENTRAL
  Filled 2018-06-08 (×16): qty 5000

## 2018-06-08 MED ORDER — MAGNESIUM SULFATE 2 GM/50ML IV SOLN
2.0000 g | Freq: Once | INTRAVENOUS | Status: AC
  Administered 2018-06-08: 2 g via INTRAVENOUS
  Filled 2018-06-08: qty 50

## 2018-06-08 MED ORDER — SODIUM CHLORIDE 0.9 % FOR CRRT
INTRAVENOUS_CENTRAL | Status: DC | PRN
  Filled 2018-06-08: qty 1000

## 2018-06-08 MED ORDER — INSULIN ASPART 100 UNIT/ML ~~LOC~~ SOLN
10.0000 [IU] | Freq: Once | SUBCUTANEOUS | Status: AC
  Administered 2018-06-08: 10 [IU] via INTRAVENOUS

## 2018-06-08 MED ORDER — CALCIUM GLUCONATE-NACL 2-0.675 GM/100ML-% IV SOLN
2.0000 g | Freq: Once | INTRAVENOUS | Status: AC
  Administered 2018-06-08: 2000 mg via INTRAVENOUS
  Filled 2018-06-08: qty 100

## 2018-06-08 MED ORDER — SODIUM CHLORIDE 0.9 % IV SOLN
1.2500 ng/kg/min | INTRAVENOUS | Status: AC
  Administered 2018-06-08: 5 ng/kg/min via INTRAVENOUS
  Administered 2018-06-09: 30 ng/kg/min via INTRAVENOUS
  Administered 2018-06-09: 25 ng/kg/min via INTRAVENOUS
  Filled 2018-06-08 (×3): qty 1

## 2018-06-08 MED ORDER — SODIUM POLYSTYRENE SULFONATE 15 GM/60ML PO SUSP
15.0000 g | Freq: Once | ORAL | Status: AC
  Administered 2018-06-08: 15 g via ORAL
  Filled 2018-06-08: qty 60

## 2018-06-08 MED ORDER — HEPARIN SODIUM (PORCINE) 1000 UNIT/ML DIALYSIS
1000.0000 [IU] | INTRAMUSCULAR | Status: DC | PRN
  Administered 2018-06-08 – 2018-06-18 (×4): 2400 [IU] via INTRAVENOUS_CENTRAL
  Administered 2018-06-20: 3000 [IU] via INTRAVENOUS_CENTRAL
  Administered 2018-06-22: 2000 [IU] via INTRAVENOUS_CENTRAL
  Filled 2018-06-08 (×2): qty 6
  Filled 2018-06-08: qty 4
  Filled 2018-06-08: qty 2
  Filled 2018-06-08 (×2): qty 6
  Filled 2018-06-08: qty 1
  Filled 2018-06-08: qty 4
  Filled 2018-06-08 (×6): qty 6
  Filled 2018-06-08: qty 4

## 2018-06-08 MED ORDER — VANCOMYCIN VARIABLE DOSE PER UNSTABLE RENAL FUNCTION (PHARMACIST DOSING): Status: DC

## 2018-06-08 MED ORDER — PRISMASOL BGK 4/2.5 32-4-2.5 MEQ/L IV SOLN
INTRAVENOUS | Status: DC
  Administered 2018-06-08 – 2018-06-18 (×15): via INTRAVENOUS_CENTRAL
  Filled 2018-06-08 (×21): qty 5000

## 2018-06-08 MED ORDER — DEXTROSE 50 % IV SOLN
50.0000 mL | Freq: Once | INTRAVENOUS | Status: AC
  Administered 2018-06-08: 50 mL via INTRAVENOUS
  Filled 2018-06-08: qty 50

## 2018-06-08 NOTE — Progress Notes (Signed)
NAME:  Bryan Parker, MRN:  [id number], DOB:  [birth year], LOS: 3 ADMISSION DATE:  2018/06/21, CONSULTATION DATE:  2018-06-21 REFERRING MD:  Dr. Rhunette Croft , CHIEF COMPLAINT:  Cardiac Arrest    Brief History   72 year old male presents to ED s/p Cardiac Arrest. Per Report patient called EMS for Abdominal Pain/Nausea/Vomiting. When EMS arrived patient was alert however went pulseless and PEA while on scene. ROSC achieved after 2 EPI and 10 minutes CPR. On arrival patient continued to be unstable and required multiple rounds of CPR. Fast exam negative. Bedside ECHO revealed slightly dilated RV. ABG 6.914/69.5/368. Progressive hypotension requiring Levophed/EPI/Vasopresin and 9 Bicarb pushes.   Wife reports that patient was complaining of severe left calf pain all day 10/19. Has a history of PE 2016. Was taken off anticoagulation 2018. Due to high suspicion of PE TNKase was given in ED. PCCM asked to admit.   Past Medical History  H/O DVT/PE, CKD  Significant Hospital Events   10/20 > Presents to ED  1021 with a partial code no CPR 10/22 bleeding from IV sites and foley, heparin d/c'd for 2 hours then resumed. Hgb 6.8 > 1u PRBC transfused  Consults: date of consult/date signed off & final recs:  PCCM 10/20 Heart failure 10/21 >   Procedures (surgical and bedside):  ETT 10/20 >> Right Femoral CVC 10/20 >> Left Femoral Aline 10/20 >>   Significant Diagnostic Tests:  CXR 10/20 > neg CT Head 10/20 >> neg Echo 10/20 > EF 55 - 60%, severely dilated RV, mod dilated RA, mod TR.  Micro Data:  Blood 10/20 >> Sputum 10/20 >>  U/A 10/20 >>   Antimicrobials:   zosyn 10/20 >> vanc 10/20 >>  Subjective:  Remains on max pressors.  MAP currently 76. Opens eyes to voice, seems somewhat agitated.  Objective   Blood pressure 101/72, pulse (!) 119, temperature 99.5 F (37.5 C), temperature source Core, resp. rate (!) 39, height 5\' 10"  (1.778 m), weight 88.2 kg, SpO2 97 %. CVP:  [9 mmHg-15  mmHg] 9 mmHg  Vent Mode: PRVC FiO2 (%):  [40 %] 40 % Set Rate:  [18 bmp] 18 bmp Vt Set:  [500 mL] 500 mL PEEP:  [5 cmH20] 5 cmH20 Plateau Pressure:  [16 cmH20-24 cmH20] 24 cmH20   Intake/Output Summary (Last 24 hours) at 06/08/2018 0816 Last data filed at 06/08/2018 0700 Gross per 24 hour  Intake 5468.66 ml  Output 395 ml  Net 5073.66 ml   Filed Weights   06/06/18 0430 06/07/18 0630 06/08/18 0615  Weight: 72.8 kg 82.1 kg 88.2 kg    Examination: General: Adult male, appears younger than stated age, resting in bed, in NAD. Neuro: Sedated but opens eyes to voice, seems agitated once awakened. HEENT: [place]/AT. Sclerae anicteric, EOMI. Cardiovascular: RRR, no M/R/G.  Lungs: Respirations even and unlabored.  CTA bilaterally, No W/R/R. Abdomen: BS x 4, abd tight but not distended.  Musculoskeletal: No gross deformities, 2+ edema.  Skin: Intact, warm, no rashes.  06/07/2018 chest x-ray reviewed endotracheal tube in adequate position, orogastric to pass through, suspect left pleural effusion and atelectasis both bases  Resolved Hospital Problem list     Assessment & Plan:   Respiratory insufficiency - s/p intubation in ED. - Maintain full vent support, not ready to wean given high pressor requirements. - Bronchial hygiene. - Follow CXR.  PEA Cardiac Arrest - concern due to massive PE.  Deemed to not be a TTM candidate due to shock. Shock -  likely mixed, cardiogenic due to above and likely septic with unclear etiology given PCT > 150. - Continue dobutamine, noreepi, vaso, epi. - Add giapreza given concern underlying sepsis. - Cardiology following.  Concern for PE - s/p TPA in ED. LLE DVT. Hx PE / DVT 2016 (no longer on anticoagulation). - Continue heparin gtt.  Septic shock - no clear source.  PCT > 150. - Continue antibiotics (vanc / zosyn). - Monitor culture data.  AKI on Chronic Kidney Failure - worsening. Severe Anion Gap Metabolic Acidosis - Lactate + Renal  failure. Hyponatremia. Hyperkalemia - s/p temporizing measures. Hypocalcemia. - After discussion with Dr. Marchelle Gearing and Dr. Shirlee Latch, will consult nephrology this AM. - Correct electrolytes as indicated. - Follow BMP.  Acute blood loss anemia - had bleeding from IV sites and foley overnight 10/22.  Received 1u PRBC and repeat Hgb 8.0. - Transfuse for Hgb < 7.  Thrombocytopenia. - Might need platelets if has continued bleeding (none this AM).  Hyperglycemia. - Continue SSI.  Acute Encephalopathy - metabolic + from PEA arrest. - Continue supportive care. - Wean sedation as able.   Disposition / Summary of Today's Plan 06/08/18   Continue supportive care. Add giapreza given concern underlying sepsis of unclear etiology. Consult nephrology given worsening renal function and concern in part due to sepsis.  No further escalation. Discussed with wife 10/23.     Code Status: 06/07/2018 partial code no CPR Family Communication: 10/23 updated wife at bedside.  Labs   CBC: Recent Labs  Lab June 26, 2018 0439  06/06/18 2106 06/07/18 0408 06/07/18 1234 06/08/18 0023 06/08/18 0441  WBC 8.9   < > 27.5* 25.6* 23.5* 26.8* 26.0*  NEUTROABS 2.6  --   --   --   --   --  16.1*  HGB 11.6*   < > 7.4* 9.1* 6.6* 6.8* 8.0*  HCT 39.6   < > 21.5* 27.0* 19.2* 18.8* 22.7*  MCV 99.7   < > 79.9* 84.4 82.4 81.7 81.4  PLT 201   < > 79* 60* 55* 56* 56*   < > = values in this interval not displayed.    Basic Metabolic Panel: Recent Labs  Lab 06/26/2018 0439  06/06/18 0124  06/06/18 1132 06/06/18 2106 06/07/18 0408 06/07/18 1234 06/08/18 0441  NA 141   < > 134*   < > 134* 133* 134* 135 134*  K 4.1   < > 2.8*   < > 3.0* 3.1* 4.7 5.6* 6.4*  CL 102   < > 99   < > 101 104 103 108 107  CO2 16*   < > 21*   < > 20* 19* 17* 17* 16*  GLUCOSE 350*   < > 380*   < > 235* 87 77 98 124*  BUN 23   < > 33*   < > 35* 35* 36* 41* 49*  CREATININE 2.57*   < > 4.00*   < > 4.23* 4.53* 4.83* 5.23* 5.80*  CALCIUM  8.8*   < > 6.5*   < > 5.3* 5.1* 5.3* 5.4* 5.1*  MG 3.0*  --  1.4*  --   --   --  1.7 2.0 1.9  PHOS 11.3*  --  1.1*  --   --   --  6.7*  --  6.8*   < > = values in this interval not displayed.   GFR: Estimated Creatinine Clearance: 13.1 mL/min (A) (by C-G formula based on SCr of 5.8 mg/dL (H)). Recent Labs  Lab 07-04-18 0854  04-Jul-2018 1929 Jul 04, 2018 2229 06/06/18 0124 06/06/18 0414  06/07/18 0408 06/07/18 1058 06/07/18 1234 06/08/18 0023 06/08/18 0441  PROCALCITON 0.38  --   --   --  >150.00  --   --  >150.00  --   --   --   --   WBC 18.4*   < >  --   --  30.5*  --    < > 25.6*  --  23.5* 26.8* 26.0*  LATICACIDVEN 18.2*   < > 10.0* 9.9*  --  6.5*  --   --  2.8*  --   --   --    < > = values in this interval not displayed.    Liver Function Tests: Recent Labs  Lab 07-04-18 0439 04-Jul-2018 1515 06/06/18 0807 06/07/18 0812 06/08/18 0441  AST 181* 1,122* 485* 271* 279*  ALT 141* 549* 266* 166* 145*  ALKPHOS 138* 136* 62 45 48  BILITOT 0.6 1.2 1.0 2.0* 2.3*  PROT 6.0* 4.0* <3.0* <3.0* 3.3*  ALBUMIN 3.2* 2.2* 1.5* 1.4* 1.4*   No results for input(s): LIPASE, AMYLASE in the last 168 hours. No results for input(s): AMMONIA in the last 168 hours.  ABG    Component Value Date/Time   PHART 7.429 06/08/2018 0547   PCO2ART 24.0 (L) 06/08/2018 0547   PO2ART 104.0 06/08/2018 0547   HCO3 15.7 (L) 06/08/2018 0547   TCO2 16 (L) 06/08/2018 0547   ACIDBASEDEF 7.0 (H) 06/08/2018 0547   O2SAT 98.0 06/08/2018 0547     Coagulation Profile: Recent Labs  Lab 07-04-2018 0439 07-04-18 0854 06/07/18 0408  INR 1.56 >10.00* 1.95    Cardiac Enzymes: Recent Labs  Lab Jul 04, 2018 0854 Jul 04, 2018 1935 06/06/18 0124 06/06/18 0807  TROPONINI 1.98* 24.95* 20.53* 11.98*    HbA1C: No results found for: HGBA1C  CBG: Recent Labs  Lab 06/07/18 1538 06/07/18 2107 06/08/18 0012 06/08/18 0445 06/08/18 0744  GLUCAP 99 121* 154* 115* 105*    CC time: 45 min  [name], PA -  [facility] Pager: [phone]  or [phone] 06/08/2018, 8:43 AM

## 2018-06-08 NOTE — Progress Notes (Signed)
ANTICOAGULATION CONSULT NOTE   Pharmacy Consult for heparin Indication: suspected pulmonary embolus and confirmed DVT  No Known Allergies  Patient Measurements: Height: 5\' 10"  (177.8 cm) Weight: 194 lb 7.1 oz (88.2 kg) IBW/kg (Calculated) : 73  Vital Signs: Temp: 98.6 F (37 C) (10/23 1200) Temp Source: Core (10/23 0743) BP: 101/59 (10/23 1200) Pulse Rate: 105 (10/23 1200)  Labs: Recent Labs    2018/06/13 1404  13-Jun-2018 1800 06-13-18 1935  06/06/18 0124 06/06/18 0807  06/07/18 0408 06/07/18 1234 06/08/18 0023 06/08/18 0404 06/08/18 0441 06/08/18 1000 06/08/18 1121  HGB  --    < >  --   --   --  8.5* 6.4*   < > 9.1* 6.6* 6.8*  --  8.0*  --   --   HCT  --    < >  --   --   --  24.5* 18.9*   < > 27.0* 19.2* 18.8*  --  22.7*  --   --   PLT  --    < >  --   --   --  168 124*   < > 60* 55* 56*  --  56*  --   --   APTT 125*  --  72*  --   --   --   --   --   --   --   --   --   --   --   --   LABPROT  --   --   --   --   --   --   --   --  21.9*  --   --   --   --   --   --   INR  --   --   --   --   --   --   --   --  1.95  --   --   --   --   --   --   HEPARINUNFRC  --   --   --   --    < > 0.41 0.72*   < > 0.74* 0.32  --  0.14*  --  0.12*  --   CREATININE  --    < >  --  3.67*  --  4.00* 4.18*   < > 4.83* 5.23*  --   --  5.80*  --  5.31*  TROPONINI  --   --   --  24.95*  --  20.53* 11.98*  --   --   --   --   --   --   --   --    < > = values in this interval not displayed.    Estimated Creatinine Clearance: 14.3 mL/min (A) (by C-G formula based on SCr of 5.31 mg/dL (H)).   Medical History: Past Medical History:  Diagnosis Date  . ARF (acute renal failure) (HCC) 06/06/2018  . Hypertension   . RVF (right ventricular failure) (HCC) 06/06/2018  . Vitamin D deficiency     Assessment: 72yo male admitted on 10/20 after witnessed arrest w/ EMS then multiple arrests in ED. PMH includes known h/o PE/DVT, now off anticoagulation at least 1 year. Reported by wife to have c/o  calf pain on 10/19 >> concern for PE and given TNKase in ED on 10/20. Patient found to have DVT. No chest CT done to r/o PE given AKI.  Pharmacy has been consulted for heparin dosing.  10/23: Heparin level this  morning supratherapeutic at 0.12 on 600 units/hr. Will increase, but continue targeting lower heparin levels of 0.3-0.5 given recent bleeding from multiple sites. RN states bleeding is much improved and Hgb this morning 8.0, plt 56. Likely cause of thrombocytopenia from sepsis/blood loss, low suspicion for HIT.   Goal of Therapy:  Heparin level 0.3-0.5 units/ml Monitor platelets by anticoagulation protocol: Yes   Plan:  Will cautiously increase heparin infusion to 700 units/hr Follow with heparin levels 8 hours Monitor daily heparin level and CBC, s/sx of bleeding  Thank you for involving pharmacy in this patient's care.  Wendelyn Breslow, PharmD PGY1 Pharmacy Resident Phone: [phone] 06/08/2018 12:29 PM

## 2018-06-08 NOTE — Progress Notes (Signed)
ANTICOAGULATION CONSULT NOTE   Pharmacy Consult for heparin Indication: suspected pulmonary embolus and confirmed DVT  No Known Allergies  Patient Measurements: Height: 5\' 10"  (177.8 cm) Weight: 194 lb 7.1 oz (88.2 kg) IBW/kg (Calculated) : 73  Vital Signs: Temp: 97.3 F (36.3 C) (10/23 2100) BP: 133/68 (10/23 2000) Pulse Rate: 90 (10/23 2100)  Labs: Recent Labs    06/06/18 0124 06/06/18 0807  06/07/18 0408 06/07/18 1234 06/08/18 0023 06/08/18 0404 06/08/18 0441 06/08/18 1000 06/08/18 1114 06/08/18 1121 06/08/18 1736 06/08/18 2021  HGB 8.5* 6.4*   < > 9.1* 6.6* 6.8*  --  8.0*  --   --   --   --   --   HCT 24.5* 18.9*   < > 27.0* 19.2* 18.8*  --  22.7*  --   --   --   --   --   PLT 168 124*   < > 60* 55* 56*  --  56*  --   --   --   --   --   LABPROT  --   --   --  21.9*  --   --   --   --   --   --   --   --   --   INR  --   --   --  1.95  --   --   --   --   --   --   --   --   --   HEPARINUNFRC 0.41 0.72*   < > 0.74* 0.32  --  0.14*  --  0.12*  --   --   --  0.25*  CREATININE 4.00* 4.18*   < > 4.83* 5.23*  --   --  5.80*  --   --  5.31* 4.96*  --   CKTOTAL  --   --   --   --   --   --   --   --   --  14,417*  --   --   --   CKMB  --   --   --   --   --   --   --   --   --  135.4*  --   --   --   TROPONINI 20.53* 11.98*  --   --   --   --   --   --   --   --   --   --   --    < > = values in this interval not displayed.    Estimated Creatinine Clearance: 15.3 mL/min (A) (by C-G formula based on SCr of 4.96 mg/dL (H)).   Medical History: Past Medical History:  Diagnosis Date  . ARF (acute renal failure) (HCC) 06/06/2018  . Hypertension   . RVF (right ventricular failure) (HCC) 06/06/2018  . Vitamin D deficiency     Assessment: 72yo male admitted on 10/20 after witnessed arrest w/ EMS then multiple arrests in ED. PMH includes known h/o PE/DVT, now off anticoagulation at least 1 year. Reported by wife to have c/o calf pain on 10/19 >> concern for PE and given  TNKase in ED on 10/20. Patient found to have DVT. No chest CT done to r/o PE given AKI.  Pharmacy has been consulted for heparin dosing. -heparin level= 0.25 after increase to 700 unitshr    Goal of Therapy:  Heparin level 0.3-0.5 units/ml Monitor platelets by anticoagulation protocol: Yes   Plan:  Increase  heparin infusion to 800 units/hr Monitor daily heparin level and CBC, s/sx of bleeding  Thank you for involving pharmacy in this patient's care.  Harland German, PharmD Clinical Pharmacist Please check Amion for pharmacy contact number

## 2018-06-08 NOTE — Progress Notes (Signed)
eLink Physician-Brief Progress Note Patient Name: ABDULAZIZ TOMAN DOB: 03-21-1946 MRN: [id number]   Date of Service  06/08/2018  HPI/Events of Note  Remains hypotensive despite max doses of multiple vasopressors  eICU Interventions  No further interventions are likely to change the dismal prognosis. Therefore, nothing further added     Intervention Category Major Interventions: Shock - evaluation and management  Merwyn Katos 06/08/2018, 5:43 AM

## 2018-06-08 NOTE — Brief Op Note (Signed)
Notified Robert, RN of Ca+ level of 5.6 @1854  after being unable to notify provider through E-link. RN verbalized understanding and will discuss with provider+

## 2018-06-08 NOTE — Progress Notes (Signed)
ANTICOAGULATION CONSULT NOTE - Follow Up Consult  Pharmacy Consult for heparin Indication: suspected pulmonary embolus and confirmed DVT  Labs: Recent Labs    06/01/2018 0854 06/14/2018 1138 05/30/2018 1404  06/09/2018 1800 06/15/2018 1935  06/06/18 0124 06/06/18 0807  06/06/18 2106 06/07/18 0408 06/07/18 1234 06/08/18 0023 06/08/18 0404  HGB 10.9*  --   --    < >  --   --   --  8.5* 6.4*   < > 7.4* 9.1* 6.6* 6.8*  --   HCT 33.5*  --   --    < >  --   --   --  24.5* 18.9*   < > 21.5* 27.0* 19.2* 18.8*  --   PLT 165  --   --    < >  --   --   --  168 124*   < > 79* 60* 55* 56*  --   APTT >200* 174* 125*  --  72*  --   --   --   --   --   --   --   --   --   --   LABPROT >90.0*  --   --   --   --   --   --   --   --   --   --  21.9*  --   --   --   INR >10.00*  --   --   --   --   --   --   --   --   --   --  1.95  --   --   --   HEPARINUNFRC  --   --   --   --   --   --    < > 0.41 0.72*   < >  --  0.74* 0.32  --  0.14*  CREATININE  --   --   --    < >  --  3.67*  --  4.00* 4.18*   < > 4.53* 4.83* 5.23*  --   --   TROPONINI 1.98*  --   --   --   --  24.95*  --  20.53* 11.98*  --   --   --   --   --   --    < > = values in this interval not displayed.    Assessment/Plan:  72yo male subtherapeutic on heparin after resumed but pt continues to bleed from foley, Hgb low (now s/p 1 unit, CBC pending), Plt very low. Will continue gtt at current rate for now and recheck level.   Vernard Gambles, PharmD, BCPS  06/08/2018,5:05 AM

## 2018-06-08 NOTE — Progress Notes (Signed)
Pharmacy Heparin Induced Thrombocytopenia (HIT) Note:  Bryan Parker is a 72 y.o. male being evaluated for HIT. Heparin was started 06/07/2018 for PE, and baseline platelets were 195, now 56.  No results found for: HEPINDPLTAB, SRALOWDOSEHP, SRAHIGHDOSEH   Score = 0 Score = 1 Score = 2 Calculated Score  Thrombocytopenia -Platelet fall < 30% -Any platelet fall with nadir < 10 -Platelet fall >50% BUT surgery within 3 days  -Any combination of platelet fall/nadir that does not fit score 0 or 2 -Platelet fall >50% AND nadir ?20 AND no surgery within 3 days     2  Timing -Platelets fall on days ? 4  AND no prior heparin exposure in previous 100 days -Consistent w/ platelet fall on days 5-10 but missing counts -Platelet fall within 1 day of start AND prior exposure to heparin within previous 31-100 days -Platelet fall after day 10 -Platelet fall 5-10 days after starting heparin -Platelet fall within 1 day of heparin start AND prior exposure within previous 5-30 days 0  Thrombosis -None suspected -Recurrent thrombosis in patient receiving therapeutic anticoagulants -Suspected thrombosis (awaiting confirmation via imaging) -Erythematous skin lesions at heparin injection sites -Confirmed new thrombosis -Skin necrosis at injection site -Anaphylactoid reaction to heparin IV bolus -Adrenal hemorrhage 0  oTher Causes -Probable other cause (see protocol for full list) -Possible other cause evident (see protocol for full list) -No alternative explanation 0  Total Calculated 4T Score:      2   Algorithm Recommendation: Score of 0-3 indicates low suspicion for HIT. Will not order antibody as likely cause for platelet drop is from bleeding/acute illness, and the timing of the platelet drop does not align with normal HIT presentation.  Plan: Continue current therapy  Wendelyn Breslow, PharmD PGY1 Pharmacy Resident Phone: [phone] 06/08/2018 8:12 AM

## 2018-06-08 NOTE — Procedures (Signed)
Hemodialysis Catheter Insertion Procedure Note Bryan Parker [id number] [birth year]  Procedure: Insertion of Hemodialysis Catheter Indications: CRRT  Procedure Details Consent: Risks of procedure as well as the alternatives and risks of each were explained to the (patient/caregiver).  Consent for procedure obtained. Time Out: Verified patient identification, verified procedure, site/side was marked, verified correct patient position, special equipment/implants available, medications/allergies/relevent history reviewed, required imaging and test results available.  Performed  Maximum sterile technique was used including antiseptics, cap, gloves, gown, hand hygiene, mask and sheet. Skin prep: Chlorhexidine; local anesthetic administered A antimicrobial bonded/coated triple lumen catheter was placed in the right internal jugular vein using the Seldinger technique.  Evaluation Blood flow good Complications: No apparent complications Patient did tolerate procedure well. Chest X-ray ordered to verify placement.  CXR: pending.  Procedure performed under direct ultrasound guidance for real time vessel cannulation.      Bryan Parker, Bryan Parker - [facility] Pgr: [phone]  or [phone] 06/08/2018, 10:40 AM

## 2018-06-08 NOTE — Progress Notes (Addendum)
Advanced Heart Failure Rounding Note  PCP-Cardiologist: Fransico Him, MD   Subjective:    Received 2 more units pRBCs yesterday. Hemoglobin 6.8 > 8.0. Platelets 56. Small amount of bloody drainage in foley bag, no longer bleeding in OGT.   Now on max pressors with MAP 70s. On norepi @ 50, Epi @ 20, vaso @ 0.03. CVP 20  Ca 5.1, K 6.4, creatinine 5.80, mag 1.9. 400 mls UOP yesterday with IV Lasix.  CVP 18.   WBC 25.6 > 26. Tmax 100.2. PCT >150 10/22. Remains on Vanc and Zosyn. Sputum + mod gram positive cocci, culture showed normal flora. Blood cultures NGTD. CXR looks worse today.    Remains intubated and sedated. FiO2 40%. On iNO 30 ppm.  ABG: pH 7.42, pCO2 24, pO2 104, Bicarb 16  Objective:   Weight Range: 88.2 kg Body mass index is 27.9 kg/m.   Vital Signs:   Temp:  [95.9 F (35.5 C)-100.4 F (38 C)] 99.5 F (37.5 C) (10/23 0743) Pulse Rate:  [79-126] 119 (10/23 0700) Resp:  [9-42] 39 (10/23 0700) BP: (80-144)/(36-131) 85/57 (10/23 0600) SpO2:  [80 %-100 %] 97 % (10/23 0700) Arterial Line BP: (63-97)/(28-53) 92/53 (10/23 0700) FiO2 (%):  [40 %] 40 % (10/23 0311) Weight:  [88.2 kg] 88.2 kg (10/23 0615) Last BM Date: 06/06/18  Weight change: Filed Weights   06/06/18 0430 06/07/18 0630 06/08/18 0615  Weight: 72.8 kg 82.1 kg 88.2 kg    Intake/Output:   Intake/Output Summary (Last 24 hours) at 06/08/2018 0754 Last data filed at 06/08/2018 0600 Gross per 24 hour  Intake 5510.9 ml  Output 395 ml  Net 5115.9 ml      Physical Exam    General: Intubated/sedated. Anasarca. Mouth twitching HEENT: + ETT +OGT Neck: Supple. JVP to jaw. Carotids 2+ bilat; no bruits. No thyromegaly or nodule noted. Cor: PMI nondisplaced. Tachy, RR, No M/G/R noted. Right groin TLC Lungs: Diminished basilar sounds Abdomen: Soft, non-tender, non-distended, no HSM. No bruits or masses. +BS  GU: Small amount of bloody drainage from foley.  Extremities: No cyanosis, clubbing, or  rash. 3+ edema into thighs. Tight BUE. Warm to touch.  Neuro: Intubated/sedated  Telemetry   Sinus tach 90-120s. Personally reviewed.   EKG    No new tracings.   Labs    CBC Recent Labs    06/08/18 0023 06/08/18 0441  WBC 26.8* 26.0*  NEUTROABS  --  16.1*  HGB 6.8* 8.0*  HCT 18.8* 22.7*  MCV 81.7 81.4  PLT 56* 56*   Basic Metabolic Panel Recent Labs    06/07/18 0408 06/07/18 1234 06/08/18 0441  NA 134* 135 134*  K 4.7 5.6* 6.4*  CL 103 108 107  CO2 17* 17* 16*  GLUCOSE 77 98 124*  BUN 36* 41* 49*  CREATININE 4.83* 5.23* 5.80*  CALCIUM 5.3* 5.4* 5.1*  MG 1.7 2.0 1.9  PHOS 6.7*  --  6.8*   Liver Function Tests Recent Labs    06/07/18 0812 06/08/18 0441  AST 271* 279*  ALT 166* 145*  ALKPHOS 45 48  BILITOT 2.0* 2.3*  PROT <3.0* 3.3*  ALBUMIN 1.4* 1.4*   No results for input(s): LIPASE, AMYLASE in the last 72 hours. Cardiac Enzymes Recent Labs    05/26/2018 1935 06/06/18 0124 06/06/18 0807  TROPONINI 24.95* 20.53* 11.98*    BNP: BNP (last 3 results) Recent Labs    06/02/2018 0854  BNP 18.0    ProBNP (last 3 results) No results for  input(s): PROBNP in the last 8760 hours.   D-Dimer No results for input(s): DDIMER in the last 72 hours. Hemoglobin A1C No results for input(s): HGBA1C in the last 72 hours. Fasting Lipid Panel No results for input(s): CHOL, HDL, LDLCALC, TRIG, CHOLHDL, LDLDIRECT in the last 72 hours. Thyroid Function Tests No results for input(s): TSH, T4TOTAL, T3FREE, THYROIDAB in the last 72 hours.  Invalid input(s): FREET3  Other results:   Imaging    No results found.   Medications:     Scheduled Medications: . chlorhexidine gluconate (MEDLINE KIT)  15 mL Mouth Rinse BID  . Chlorhexidine Gluconate Cloth  6 each Topical Daily  . furosemide  80 mg Intravenous BID  . insulin aspart  1-3 Units Subcutaneous Q4H  . insulin detemir  5 Units Subcutaneous Q12H  . lidocaine  1 application Urethral Once  . mouth  rinse  15 mL Mouth Rinse 10 times per day  . sodium chloride flush  10-40 mL Intracatheter Q12H  . THROMBI-PAD  1 each Topical Once    Infusions: . sodium chloride Stopped (06/08/18 0557)  . DOBUTamine 18 mcg/kg/min (06/08/18 0600)  . epinephrine 20 mcg/min (06/08/18 0600)  . famotidine (PEPCID) IV    . fentaNYL infusion INTRAVENOUS 100 mcg/hr (06/07/18 0405)  . heparin 600 Units/hr (06/08/18 0600)  . midazolam (VERSED) infusion 8 mg/hr (06/08/18 0600)  . norepinephrine (LEVOPHED) Adult infusion 50 mcg/min (06/08/18 0600)  . piperacillin-tazobactam (ZOSYN)  IV Stopped (06/08/18 0145)  . sodium chloride    . vancomycin Stopped (06/07/18 2246)  . vasopressin (PITRESSIN) infusion - *FOR SHOCK* 0.03 Units/min (06/08/18 0600)    PRN Medications: fentaNYL (SUBLIMAZE) injection, midazolam, sodium chloride flush    Patient Profile   Bryan Parker is a 72 y.o. 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  Presented to [facility] after PEA arrest with 10+ minutes down. Now with RV failure.  Assessment/Plan   1. 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  - Echo 10/2014: EF 60%, RV normal, PA peak pressure 73 mmHg - Echo 06/02/2018: EF 55-60%, RV severely dilated, moderate TR, moderately dilated RA, PA peak pressure 31 mmHg - CVP 20. Very edematous.  - On dobutamine 20, NE 50, epi 20, vaso 0.03 with MAPs 50-60s - Continue iNO 30 ppm - Add low dose dopamine if needed.  - Consider bicarb today.. Bicarb 16, but pH 7.42. May respond better to pressors.  - Hold off on RV impella with bleeding and extensive clot.   2. PEA arrest - ?secondary to massive PE - Received 10+ minutes CPR. Head CT negative. Seems to be neuro intact. No change.   3. Presumed massive PE - Has hx of PE in 2016, but has not been on chronic anticoagulation (off >1 year) - BLE dopplers positive for left popliteal acute DVT  - Received TNKase in ED. Now  on heparin drip. No change.   4. ID - WBC 29.5 > 25.6 > 26. Tmax 100.2 - Respiratory culture: moderate gram positive cocci in pairs and in chains. Culture shows normal flora - Blood cultures 10/20 NGTD.  - On Zosyn and Vanc. - Consider stress dose steroids.   5. AKI on CKD - Baseline creatinine looks to be 1.6-2.2 range - Creatinine trending up. 5.80 today. 400 mls UOP. Heading toward CRRT.  - Renal US negative for hydronephrosis  6. Anemia - Bleeding post TNKase, has had 4 units PRBCs, hgb  6.8 last night but up to 8 with transfusion this morning.  - Received TNKase and now on heparin drip.  - Bleeding from urethra. Less bloody drainage from OGT - Platelets low 56. Less likely HIT. Probably secondary to bleeding/sepsis.   7. Elevated liver enzymes - In setting of shock. Trend. No change.   8. Hypocalcemia - Ca++ 5.1. Will supp. Check ionized calcium with repeat labs.   9. Acute respiratory failure - Remains intubated. FiO2 40% - ABG: pH 7.42, pCO2 24, pO2 104, Bicarb 16  10. Limited code - No CPR, ACLS meds, or shocks.  11. Hyperkalemia - In setting of AKI and blood products. Give kayexelate.  Medication concerns reviewed with patient and pharmacy team. Barriers identified: unable to determine at this time.    Length of Stay: [place], NP  06/08/2018, 7:54 AM  Advanced Heart Failure Team Pager [phone] (M-F; 7a - 4p)  Please contact [facility] for night-coverage after hours (4p -7a ) and weekends on [email or web address]  Patient seen with NP, agree with the above note.   This morning, he remains on high dose epinephrine, dobutamine, norepinephrine as well as vasopressin. MAP stable this morning.  He awakens and follows commands.  He is on heparin gtt.  Still with bloody urine but bleeding in NGT appears to have slowed.  Hgb down to 6.8 last night but transfused and 8 this morning.  Plts trending down.  K and creatinine trending up, minimal UOP with IV  Lasix, CVP 18.    Tm 100, PCT > 150 with left-sided infiltrate.  WBCs up to 26. He is on vancomycin/Zosyn.   HCO3 low at 16, pH ok today at 7.45.   He is in NSR on telemetry.   On exam, he awakens and follows commands. JVP elevated. Decreased breath sounds on left.  Mildly tachy, regular rhythm.   MAP stable this morning.  He is neurologically intact. Continue current pressor/inotrope support with hope that RV will recover with time.  With ongoing bleeding s/p TNKase and suspected extensive clot (DVT/PE), we decided against placing RV Impella.  - Continue current pressors + dobutamine.  - Continue heparin gtt.   Suspect HCAP, possibly due to aspiration.  PCT > 150 and now on empiric vancomycin/Zosyn. WBCs 25, low grade fever. May be component of septic shock.   Still with bleeding in foley, OGT bleeding has resolved.  Follow hgb closely.   Creatinine continues to rise with elevated K volume overload.  Poor UOP despite IV Lasix.  Will need to discuss with renal.  At baseline, he is functional, but CVVH would be very difficult here with high pressor requirement.  - Increase Lasix to 160 mg IV bid.  - Treat elevated K.   CRITICAL CARE Performed by: Loralie Champagne  Total critical care time: 35 minutes  Critical care time was exclusive of separately billable procedures and treating other patients.  Critical care was necessary to treat or prevent imminent or life-threatening deterioration.  Critical care was time spent personally by me on the following activities: development of treatment plan with patient and/or surrogate as well as nursing, discussions with consultants, evaluation of patient's response to treatment, examination of patient, obtaining history from patient or surrogate, ordering and performing treatments and interventions, ordering and review of laboratory studies, ordering and review of radiographic studies, pulse oximetry and re-evaluation of patient's  condition.  Loralie Champagne 06/08/2018 8:38 AM

## 2018-06-08 NOTE — Consult Note (Signed)
[facility] Renal Consultation Note  Requesting MD: Ramaswamy Indication for Consultation: A on CRF- prolonged hypotension  HPI:  Bryan Parker is a 72 y.o. 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  He was admitted after a PEA arrest on 10/20 presumed due to a significant pulmonary embolism.  He also seems to have a history of CKD with creatinine in the mid to high ones in 2016.  When he presented creatinine was 2.57.  With events it has worsened daily and is 5.8 today.  Urine output falling, potassium up to 6.4 today.  Has remained on significant amount of pressors since admission-now possibly thinking septic shock.  Also possibly an element of cardiogenic shock with high right-sided pressures-apparently an RV Impella is being considered.  We are asked for supportive care of acute on chronic renal failure, now meeting need for renal replacement therapy.  Given hemodynamic instability will need CRRT  Creatinine, Ser  Date/Time Value Ref Range Status  06/08/2018 04:41 AM 5.80 (H) 0.61 - 1.24 mg/dL Final  06/07/2018 12:34 PM 5.23 (H) 0.61 - 1.24 mg/dL Final  06/07/2018 04:08 AM 4.83 (H) 0.61 - 1.24 mg/dL Final  06/06/2018 09:06 PM 4.53 (H) 0.61 - 1.24 mg/dL Final  06/06/2018 11:32 AM 4.23 (H) 0.61 - 1.24 mg/dL Final  06/06/2018 08:07 AM 4.18 (H) 0.61 - 1.24 mg/dL Final  06/06/2018 01:24 AM 4.00 (H) 0.61 - 1.24 mg/dL Final  06/04/2018 07:35 PM 3.67 (H) 0.61 - 1.24 mg/dL Final  05/22/2018 03:15 PM 3.25 (H) 0.61 - 1.24 mg/dL Final    Comment:    DELTA CHECK NOTED  05/27/2018 04:42 AM 2.20 (H) 0.61 - 1.24 mg/dL Final  06/16/2018 04:39 AM 2.57 (H) 0.61 - 1.24 mg/dL Final  10/23/2014 05:15 AM 1.57 (H) 0.50 - 1.35 mg/dL Final  10/22/2014 05:40 AM 1.63 (H) 0.50 - 1.35 mg/dL Final  10/21/2014 05:44 AM 1.73 (H) 0.50 - 1.35 mg/dL Final  10/20/2014 04:58 AM 1.75 (H) 0.50 - 1.35 mg/dL Final  10/19/2014 04:26 AM 1.60 (H) 0.50 - 1.35  mg/dL Final  10/18/2014 03:15 AM 1.63 (H) 0.50 - 1.35 mg/dL Final  10/17/2014 07:50 AM 1.87 (H) 0.50 - 1.35 mg/dL Final  10/16/2014 02:27 PM 1.68 (H) 0.50 - 1.35 mg/dL Final     PMHx:   Past Medical History:  Diagnosis Date  . ARF (acute renal failure) ([facility]) 06/06/2018  . Hypertension   . RVF (right ventricular failure) ([facility]) 06/06/2018  . Vitamin D deficiency     Past Surgical History:  Procedure Laterality Date  . PROSTATE SURGERY    . SPINE SURGERY      Family Hx:  Family History  Problem Relation Age of Onset  . Deep vein thrombosis Sister   . Pulmonary embolism Sister   . Heart attack Brother 98  . Heart disease Brother     Social History:  reports that he has quit smoking. He does not have any smokeless tobacco history on file. He reports that he does not drink alcohol or use drugs.  Allergies: No Known Allergies  Medications: Prior to Admission medications   Medication Sig Start Date End Date Taking? Authorizing Provider  atorvastatin (LIPITOR) 20 MG tablet Take 20 mg by mouth daily. 04/29/18  Yes [provider]  gabapentin (NEURONTIN) 300 MG capsule Take 300 mg by mouth 2 (two) times daily. 04/24/18  Yes [provider]  losartan (COZAAR) 100 MG tablet Take 100 mg by  mouth daily. 04/29/18  Yes [provider]  polyethylene glycol (MIRALAX / GLYCOLAX) packet Take 17 g by mouth every other day.    Yes [provider]  Vitamin D, Ergocalciferol, (DRISDOL) 50000 UNITS CAPS capsule Take 50,000 Units by mouth every Saturday.    Yes [provider]  oxyCODONE-acetaminophen (PERCOCET/ROXICET) 5-325 MG per tablet Take 1-2 tablets by mouth every 3 (three) hours as needed for moderate pain. Patient not taking: Reported on 06/15/2018 10/23/14   Eugenie Filler, MD  Rivaroxaban (XARELTO STARTER PACK) 15 & 20 MG TBPK Take as directed on package: Start with one 45m tablet by mouth twice a day with food. On Day 22, switch to one 242m tablet once a day with food. Take 15 mg 2 times daily through 11/09/14, then 20 mg daily starting 11/10/14. Patient not taking: Reported on 06/13/2018 10/23/14   ThEugenie FillerMD  rivaroxaban (XARELTO) 20 MG TABS tablet Take 1 tablet (20 mg total) by mouth daily with supper. Patient not taking: Reported on 05/24/2018 11/10/14   ThEugenie FillerMD    I have reviewed the patient's current medications.  Labs:  Results for orders placed or performed during the hospital encounter of 06/06/2018 (from the past 48 hour(s))  Glucose, capillary     Status: Abnormal   Collection Time: 06/06/18 12:52 PM  Result Value Ref Range   Glucose-Capillary 174 (H) 70 - 99 mg/dL   Comment 1 Arterial Specimen   Glucose, capillary     Status: Abnormal   Collection Time: 06/06/18  2:11 PM  Result Value Ref Range   Glucose-Capillary 152 (H) 70 - 99 mg/dL   Comment 1 Arterial Specimen   CBC     Status: Abnormal   Collection Time: 06/06/18  3:06 PM  Result Value Ref Range   WBC 27.2 (H) 4.0 - 10.5 K/uL   RBC 3.25 (L) 4.22 - 5.81 MIL/uL   Hemoglobin 9.0 (L) 13.0 - 17.0 g/dL    Comment: REPEATED TO VERIFY POST TRANSFUSION SPECIMEN    HCT 26.1 (L) 39.0 - 52.0 %   MCV 80.3 80.0 - 100.0 fL    Comment: POST TRANSFUSION SPECIMEN   MCH 27.7 26.0 - 34.0 pg   MCHC 34.5 30.0 - 36.0 g/dL   RDW 21.2 (H) 11.5 - 15.5 %   Platelets 93 (L) 150 - 400 K/uL    Comment: Immature Platelet Fraction may be clinically indicated, consider ordering this additional test [id number] TO VERIFY SPECIMEN CHECKED FOR CLOTS PLATELET COUNT CONFIRMED BY SMEAR    nRBC 0.1 0.0 - 0.2 %    Comment: Performed at [facility] [place]Glucose, capillary     Status: Abnormal   Collection Time: 06/06/18  3:06 PM  Result Value Ref Range   Glucose-Capillary 118 (H) 70 - 99 mg/dL   Comment 1 Arterial Specimen   Glucose, capillary     Status: Abnormal   Collection Time: 06/06/18  4:07 PM   Result Value Ref Range   Glucose-Capillary 103 (H) 70 - 99 mg/dL   Comment 1 Arterial Specimen   Glucose, capillary     Status: None   Collection Time: 06/06/18  5:10 PM  Result Value Ref Range   Glucose-Capillary 87 70 - 99 mg/dL   Comment 1 Arterial Specimen   Heparin level (unfractionated)     Status: Abnormal   Collection Time: 06/06/18  5:12 PM  Result Value Ref Range  Heparin Unfractionated 0.80 (H) 0.30 - 0.70 IU/mL    Comment: (NOTE) If heparin results are below expected values, and patient dosage has  been confirmed, suggest follow up testing of antithrombin III levels. Performed at [facility], [place] [address], [place], [place] [address]   Glucose, capillary     Status: Abnormal   Collection Time: 06/06/18  6:14 PM  Result Value Ref Range   Glucose-Capillary 57 (L) 70 - 99 mg/dL   Comment 1 Arterial Specimen   Glucose, capillary     Status: None   Collection Time: 06/06/18  6:32 PM  Result Value Ref Range   Glucose-Capillary 91 70 - 99 mg/dL   Comment 1 Arterial Specimen   Glucose, capillary     Status: None   Collection Time: 06/06/18  7:42 PM  Result Value Ref Range   Glucose-Capillary 75 70 - 99 mg/dL   Comment 1 Arterial Specimen   Glucose, capillary     Status: None   Collection Time: 06/06/18  9:01 PM  Result Value Ref Range   Glucose-Capillary 78 70 - 99 mg/dL   Comment 1 Arterial Specimen   CBC     Status: Abnormal   Collection Time: 06/06/18  9:06 PM  Result Value Ref Range   WBC 27.5 (H) 4.0 - 10.5 K/uL   RBC 2.69 (L) 4.22 - 5.81 MIL/uL   Hemoglobin 7.4 (L) 13.0 - 17.0 g/dL   HCT 21.5 (L) 39.0 - 52.0 %   MCV 79.9 (L) 80.0 - 100.0 fL   MCH 27.5 26.0 - 34.0 pg   MCHC 34.4 30.0 - 36.0 g/dL   RDW 22.2 (H) 11.5 - 15.5 %   Platelets 79 (L) 150 - 400 K/uL    Comment: REPEATED TO VERIFY Immature Platelet Fraction may be clinically indicated, consider ordering this additional test [id number] CONSISTENT WITH PREVIOUS RESULT    nRBC 0.2 0.0 -  0.2 %    Comment: Performed at [facility], [place] [address], [place], [place] [address]  Basic metabolic panel     Status: Abnormal   Collection Time: 06/06/18  9:06 PM  Result Value Ref Range   Sodium 133 (L) 135 - 145 mmol/L   Potassium 3.1 (L) 3.5 - 5.1 mmol/L   Chloride 104 98 - 111 mmol/L   CO2 19 (L) 22 - 32 mmol/L   Glucose, Bld 87 70 - 99 mg/dL   BUN 35 (H) 8 - 23 mg/dL   Creatinine, Ser 4.53 (H) 0.61 - 1.24 mg/dL   Calcium 5.1 (LL) 8.9 - 10.3 mg/dL    Comment: CRITICAL RESULT CALLED TO, READ BACK BY AND VERIFIED WITH: SRTRIBLIN,P RN 06/06/2018 2210 JORDANS    GFR calc non Af Amer 12 (L) >60 mL/min   GFR calc Af Amer 14 (L) >60 mL/min    Comment: (NOTE) The eGFR has been calculated using the CKD EPI equation. This calculation has not been validated in all clinical situations. eGFR's persistently <60 mL/min signify possible Chronic Kidney Disease.    Anion gap 10 5 - 15    Comment: Performed at [facility] [address], [place], [place] [address]  Glucose, capillary     Status: Abnormal   Collection Time: 06/06/18 10:00 PM  Result Value Ref Range   Glucose-Capillary 58 (L) 70 - 99 mg/dL   Comment 1 Arterial Specimen   Glucose, capillary     Status: None   Collection Time: 06/06/18 10:36 PM  Result  Value Ref Range   Glucose-Capillary 97 70 - 99 mg/dL   Comment 1 Arterial Specimen   Prepare RBC     Status: None   Collection Time: 06/06/18 11:02 PM  Result Value Ref Range   Order Confirmation      ORDER PROCESSED BY BLOOD BANK Performed at [facility], [place] [address] [place], [place] [address]   I-STAT 3, arterial blood gas (G3+)     Status: Abnormal   Collection Time: 06/06/18 11:39 PM  Result Value Ref Range   pH, Arterial 7.416 7.350 - 7.450   pCO2 arterial 21.8 (L) 32.0 - 48.0 mmHg   pO2, Arterial 105.0 83.0 - 108.0 mmHg   Bicarbonate 14.1 (L) 20.0 - 28.0 mmol/L   TCO2 15 (L) 22 - 32 mmol/L   O2 Saturation 98.0 %   Acid-base deficit  10.0 (H) 0.0 - 2.0 mmol/L   Patient temperature 36.1 C    Sample type ARTERIAL   Glucose, capillary     Status: None   Collection Time: 06/07/18 12:45 AM  Result Value Ref Range   Glucose-Capillary 80 70 - 99 mg/dL   Comment 1 Arterial Specimen   Glucose, capillary     Status: None   Collection Time: 06/07/18  3:20 AM  Result Value Ref Range   Glucose-Capillary 86 70 - 99 mg/dL   Comment 1 Arterial Specimen   Procalcitonin     Status: None   Collection Time: 06/07/18  4:08 AM  Result Value Ref Range   Procalcitonin >150.00 ng/mL    Comment:        Interpretation: PCT >= 10 ng/mL: Important systemic inflammatory response, almost exclusively due to severe bacterial sepsis or septic shock. (NOTE)       Sepsis PCT Algorithm           Lower Respiratory Tract                                      Infection PCT Algorithm    ----------------------------     ----------------------------         PCT < 0.25 ng/mL                PCT < 0.10 ng/mL         Strongly encourage             Strongly discourage   discontinuation of antibiotics    initiation of antibiotics    ----------------------------     -----------------------------       PCT 0.25 - 0.50 ng/mL            PCT 0.10 - 0.25 ng/mL               OR       >80% decrease in PCT            Discourage initiation of                                            antibiotics      Encourage discontinuation           of antibiotics    ----------------------------     -----------------------------         PCT >= 0.50 ng/mL  PCT 0.26 - 0.50 ng/mL                AND       <80% decrease in PCT             Encourage initiation of                                             antibiotics       Encourage continuation           of antibiotics    ----------------------------     -----------------------------        PCT >= 0.50 ng/mL                  PCT > 0.50 ng/mL               AND         increase in PCT                  Strongly  encourage                                      initiation of antibiotics    Strongly encourage escalation           of antibiotics                                     -----------------------------                                           PCT <= 0.25 ng/mL                                                 OR                                        > 80% decrease in PCT                                     Discontinue / Do not initiate                                             antibiotics Performed at [facility], [address], [name], [place] [address]   Heparin level (unfractionated)     Status: Abnormal   Collection Time: 06/07/18  4:08 AM  Result Value Ref Range   Heparin Unfractionated 0.74 (H) 0.30 - 0.70 IU/mL    Comment: (NOTE) If heparin results are below expected values, and patient dosage has  been confirmed, suggest follow up testing of antithrombin III levels. Performed at [facility], [place]  [address] [place], [place] [address]   CBC     Status: Abnormal   Collection Time: 06/07/18  4:08 AM  Result Value Ref Range   WBC 25.6 (H) 4.0 - 10.5 K/uL   RBC 3.20 (L) 4.22 - 5.81 MIL/uL   Hemoglobin 9.1 (L) 13.0 - 17.0 g/dL   HCT 27.0 (L) 39.0 - 52.0 %   MCV 84.4 80.0 - 100.0 fL   MCH 28.4 26.0 - 34.0 pg   MCHC 33.7 30.0 - 36.0 g/dL   RDW 18.1 (H) 11.5 - 15.5 %   Platelets 60 (L) 150 - 400 K/uL    Comment: REPEATED TO VERIFY Immature Platelet Fraction may be clinically indicated, consider ordering this additional test [id number] CONSISTENT WITH PREVIOUS RESULT    nRBC 0.2 0.0 - 0.2 %    Comment: Performed at [facility], [place] [address], [place], [place] [address]  Magnesium     Status: None   Collection Time: 06/07/18  4:08 AM  Result Value Ref Range   Magnesium 1.7 1.7 - 2.4 mg/dL    Comment: Performed at [facility] [address] [place], [place] [address]  Phosphorus     Status: Abnormal   Collection Time: 06/07/18  4:08 AM   Result Value Ref Range   Phosphorus 6.7 (H) 2.5 - 4.6 mg/dL    Comment: Performed at [facility] [address] [place], [place] [address]  Basic metabolic panel     Status: Abnormal   Collection Time: 06/07/18  4:08 AM  Result Value Ref Range   Sodium 134 (L) 135 - 145 mmol/L   Potassium 4.7 3.5 - 5.1 mmol/L    Comment: CRITICAL RESULT CALLED TO, READ BACK BY AND VERIFIED WITH:   Chloride 103 98 - 111 mmol/L   CO2 17 (L) 22 - 32 mmol/L   Glucose, Bld 77 70 - 99 mg/dL   BUN 36 (H) 8 - 23 mg/dL   Creatinine, Ser 4.83 (H) 0.61 - 1.24 mg/dL   Calcium 5.3 (LL) 8.9 - 10.3 mg/dL    Comment: CRITICAL RESULT CALLED TO, READ BACK BY AND VERIFIED WITH: M AMO,RN [phone] WILDERK    GFR calc non Af Amer 11 (L) >60 mL/min   GFR calc Af Amer 13 (L) >60 mL/min    Comment: (NOTE) The eGFR has been calculated using the CKD EPI equation. This calculation has not been validated in all clinical situations. eGFR's persistently <60 mL/min signify possible Chronic Kidney Disease.    Anion gap 14 5 - 15    Comment: Performed at [facility] [address] [place], [place] [address]  Protime-INR     Status: Abnormal   Collection Time: 06/07/18  4:08 AM  Result Value Ref Range   Prothrombin Time 21.9 (H) 11.4 - 15.2 seconds   INR 1.95     Comment: Performed at [facility] [address], [place], [place] [address]  I-STAT 3, arterial blood gas (G3+)     Status: Abnormal   Collection Time: 06/07/18  4:47 AM  Result Value Ref Range   pH, Arterial 7.357 7.350 - 7.450   pCO2 arterial 31.0 (L) 32.0 - 48.0 mmHg   pO2, Arterial 71.0 (L) 83.0 - 108.0 mmHg   Bicarbonate 17.6 (L) 20.0 - 28.0 mmol/L   TCO2 19 (L) 22 - 32 mmol/L   O2 Saturation 95.0 %   Acid-base deficit 7.0 (H) 0.0 - 2.0 mmol/L  Patient temperature 35.5 C    Sample type ARTERIAL   Glucose, capillary     Status: Abnormal   Collection Time: 06/07/18  8:11 AM  Result Value Ref Range   Glucose-Capillary 59 (L)  70 - 99 mg/dL  Hepatic function panel     Status: Abnormal   Collection Time: 06/07/18  8:12 AM  Result Value Ref Range   Total Protein <3.0 (L) 6.5 - 8.1 g/dL   Albumin 1.4 (L) 3.5 - 5.0 g/dL   AST 271 (H) 15 - 41 U/L   ALT 166 (H) 0 - 44 U/L   Alkaline Phosphatase 45 38 - 126 U/L   Total Bilirubin 2.0 (H) 0.3 - 1.2 mg/dL   Bilirubin, Direct 0.9 (H) 0.0 - 0.2 mg/dL   Indirect Bilirubin 1.1 (H) 0.3 - 0.9 mg/dL    Comment: Performed at [facility] [address] [place], [place] [address]  Glucose, capillary     Status: Abnormal   Collection Time: 06/07/18  8:39 AM  Result Value Ref Range   Glucose-Capillary 136 (H) 70 - 99 mg/dL   Comment 1 Arterial Specimen   Lactic acid, plasma     Status: Abnormal   Collection Time: 06/07/18 10:58 AM  Result Value Ref Range   Lactic Acid, Venous 2.8 (HH) 0.5 - 1.9 mmol/L    Comment: CRITICAL RESULT CALLED TO, READ BACK BY AND VERIFIED WITH: M.COBURN,RN 1208 06/07/18 CLARK,S Performed at [facility], [place] [address] [place], [place] [address]   Glucose, capillary     Status: None   Collection Time: 06/07/18 11:23 AM  Result Value Ref Range   Glucose-Capillary 98 70 - 99 mg/dL   Comment 1 Arterial Specimen   Heparin level (unfractionated)     Status: None   Collection Time: 06/07/18 12:34 PM  Result Value Ref Range   Heparin Unfractionated 0.32 0.30 - 0.70 IU/mL    Comment: (NOTE) If heparin results are below expected values, and patient dosage has  been confirmed, suggest follow up testing of antithrombin III levels. Performed at [facility], [place] [address] [place], [place] [address]   Basic metabolic panel     Status: Abnormal   Collection Time: 06/07/18 12:34 PM  Result Value Ref Range   Sodium 135 135 - 145 mmol/L   Potassium 5.6 (H) 3.5 - 5.1 mmol/L   Chloride 108 98 - 111 mmol/L   CO2 17 (L) 22 - 32 mmol/L   Glucose, Bld 98 70 - 99 mg/dL   BUN 41 (H) 8 - 23 mg/dL   Creatinine, Ser 5.23 (H) 0.61 - 1.24  mg/dL   Calcium 5.4 (LL) 8.9 - 10.3 mg/dL    Comment: CRITICAL RESULT CALLED TO, READ BACK BY AND VERIFIED WITH: Herbie Saxon RN 1344 06/07/2018 BY A BENNETT    GFR calc non Af Amer 10 (L) >60 mL/min   GFR calc Af Amer 12 (L) >60 mL/min    Comment: (NOTE) The eGFR has been calculated using the CKD EPI equation. This calculation has not been validated in all clinical situations. eGFR's persistently <60 mL/min signify possible Chronic Kidney Disease.    Anion gap 10 5 - 15    Comment: Performed at [facility] [address] [place], [place] [address]  Magnesium     Status: None   Collection Time: 06/07/18 12:34 PM  Result Value Ref Range   Magnesium 2.0 1.7 - 2.4 mg/dL    Comment:  Performed at [facility], [place] [address], [place] [address]  CBC     Status: Abnormal   Collection Time: 06/07/18 12:34 PM  Result Value Ref Range   WBC 23.5 (H) 4.0 - 10.5 K/uL   RBC 2.33 (L) 4.22 - 5.81 MIL/uL   Hemoglobin 6.6 (LL) 13.0 - 17.0 g/dL    Comment: REPEATED TO VERIFY THIS RESULT HAS BEEN CALLED TO [place] COBURN,RN BY ZELDA BEECH ON 10 22 2019 AT 1311, AND HAS BEEN READ BACK. CRITICAL VALUE VERIFIED.    HCT 19.2 (L) 39.0 - 52.0 %   MCV 82.4 80.0 - 100.0 fL   MCH 28.3 26.0 - 34.0 pg   MCHC 34.4 30.0 - 36.0 g/dL   RDW 18.4 (H) 11.5 - 15.5 %   Platelets 55 (L) 150 - 400 K/uL    Comment: REPEATED TO VERIFY Immature Platelet Fraction may be clinically indicated, consider ordering this additional test [id number] CONSISTENT WITH PREVIOUS RESULT    nRBC 0.3 (H) 0.0 - 0.2 %    Comment: Performed at [facility], [place] [address], [place], [place] [address]  Prepare RBC     Status: None   Collection Time: 06/07/18  2:22 PM  Result Value Ref Range   Order Confirmation      ORDER PROCESSED BY BLOOD BANK Performed at [facility], [place] [address] [place], [place] [address]   Glucose, capillary     Status: None   Collection Time: 06/07/18  3:38 PM  Result Value  Ref Range   Glucose-Capillary 99 70 - 99 mg/dL   Comment 1 Arterial Specimen   Glucose, capillary     Status: Abnormal   Collection Time: 06/07/18  9:07 PM  Result Value Ref Range   Glucose-Capillary 121 (H) 70 - 99 mg/dL   Comment 1 Arterial Specimen   Glucose, capillary     Status: Abnormal   Collection Time: 06/08/18 12:12 AM  Result Value Ref Range   Glucose-Capillary 154 (H) 70 - 99 mg/dL   Comment 1 Arterial Specimen   CBC     Status: Abnormal   Collection Time: 06/08/18 12:23 AM  Result Value Ref Range   WBC 26.8 (H) 4.0 - 10.5 K/uL   RBC 2.30 (L) 4.22 - 5.81 MIL/uL   Hemoglobin 6.8 (LL) 13.0 - 17.0 g/dL    Comment: REPEATED TO VERIFY CRITICAL VALUE NOTED.  VALUE IS CONSISTENT WITH PREVIOUSLY REPORTED AND CALLED VALUE.    HCT 18.8 (L) 39.0 - 52.0 %   MCV 81.7 80.0 - 100.0 fL   MCH 29.6 26.0 - 34.0 pg   MCHC 36.2 (H) 30.0 - 36.0 g/dL   RDW 17.0 (H) 11.5 - 15.5 %   Platelets 56 (L) 150 - 400 K/uL    Comment: REPEATED TO VERIFY SPECIMEN CHECKED FOR CLOTS Immature Platelet Fraction may be clinically indicated, consider ordering this additional test [id number] CONSISTENT WITH PREVIOUS RESULT    nRBC 0.2 0.0 - 0.2 %    Comment: Performed at [facility], [place] [address] [place], [place] [address]  Prepare RBC     Status: None   Collection Time: 06/08/18  1:56 AM  Result Value Ref Range   Order Confirmation      ORDER PROCESSED BY BLOOD BANK Performed at [facility], [place] [address], [place], [place] [address]   Heparin level (unfractionated)     Status: Abnormal   Collection Time: 06/08/18  4:04 AM  Result Value Ref Range   Heparin Unfractionated 0.14 (L) 0.30 - 0.70 IU/mL    Comment: (NOTE) If heparin results are below expected values, and patient dosage has  been confirmed, suggest follow up testing of antithrombin III levels. Performed at [facility], [place] [address], [place], [place] [address]   Comprehensive metabolic panel     Status:  Abnormal   Collection Time: 06/08/18  4:41 AM  Result Value Ref Range   Sodium 134 (L) 135 - 145 mmol/L   Potassium 6.4 (HH) 3.5 - 5.1 mmol/L    Comment: NO VISIBLE HEMOLYSIS CRITICAL RESULT CALLED TO, READ BACK BY AND VERIFIED WITH: K ROBERTS,RN [phone] WILDERK    Chloride 107 98 - 111 mmol/L   CO2 16 (L) 22 - 32 mmol/L   Glucose, Bld 124 (H) 70 - 99 mg/dL   BUN 49 (H) 8 - 23 mg/dL   Creatinine, Ser 5.80 (H) 0.61 - 1.24 mg/dL   Calcium 5.1 (LL) 8.9 - 10.3 mg/dL    Comment: CRITICAL RESULT CALLED TO, READ BACK BY AND VERIFIED WITH: K ROBERTS,RN [phone] WILDERK    Total Protein 3.3 (L) 6.5 - 8.1 g/dL   Albumin 1.4 (L) 3.5 - 5.0 g/dL   AST 279 (H) 15 - 41 U/L   ALT 145 (H) 0 - 44 U/L   Alkaline Phosphatase 48 38 - 126 U/L   Total Bilirubin 2.3 (H) 0.3 - 1.2 mg/dL   GFR calc non Af Amer 9 (L) >60 mL/min   GFR calc Af Amer 10 (L) >60 mL/min    Comment: (NOTE) The eGFR has been calculated using the CKD EPI equation. This calculation has not been validated in all clinical situations. eGFR's persistently <60 mL/min signify possible Chronic Kidney Disease.    Anion gap 11 5 - 15    Comment: Performed at [facility] [address] [place],  [address]  CBC with Differential/Platelet     Status: Abnormal   Collection Time: 06/08/18  4:41 AM  Result Value Ref Range   WBC 26.0 (H) 4.0 - 10.5 K/uL   RBC 2.79 (L) 4.22 - 5.81 MIL/uL   Hemoglobin 8.0 (L) 13.0 - 17.0 g/dL   HCT 22.7 (L) 39.0 - 52.0 %   MCV 81.4 80.0 - 100.0 fL   MCH 28.7 26.0 - 34.0 pg   MCHC 35.2 30.0 - 36.0 g/dL   RDW 15.7 (H) 11.5 - 15.5 %   Platelets 56 (L) 150 - 400 K/uL    Comment: REPEATED TO VERIFY Immature Platelet Fraction may be clinically indicated, consider ordering this additional test [id number] CONSISTENT WITH PREVIOUS RESULT    nRBC 0.2 0.0 - 0.2 %   Neutrophils Relative % 62 %   Neutro Abs 16.1 (H) 1.7 - 7.7 K/uL   Lymphocytes Relative 4 %   Lymphs Abs 1.1 0.7 - 4.0 K/uL    Monocytes Relative 4 %   Monocytes Absolute 1.1 (H) 0.1 - 1.0 K/uL   Eosinophils Relative 29 %   Eosinophils Absolute 7.4 (H) 0.0 - 0.5 K/uL   Basophils Relative 0 %   Basophils Absolute 0.0 0.0 - 0.1 K/uL   WBC Morphology DOHLE BODIES     Comment: INCREASED BANDS (>20% BANDS)   Immature Granulocytes 1 %   Abs Immature Granulocytes 0.22 (H) 0.00 - 0.07 K/uL   Burr Cells PRESENT    Polychromasia PRESENT     Comment: Performed at [facility], [address] [place], [place] [address]  Magnesium     Status: None   Collection Time: 06/08/18  4:41 AM  Result Value Ref Range   Magnesium 1.9 1.7 - 2.4 mg/dL    Comment: Performed at [facility], [place] [address], [place],  [address]  Phosphorus     Status: Abnormal   Collection Time: 06/08/18  4:41 AM  Result Value Ref Range   Phosphorus 6.8 (H) 2.5 - 4.6 mg/dL    Comment: Performed at [facility] [address] [place], [place] [address]  Glucose, capillary     Status: Abnormal   Collection Time: 06/08/18  4:45 AM  Result Value Ref Range   Glucose-Capillary 115 (H) 70 - 99 mg/dL   Comment 1 Arterial Specimen   I-STAT 3, arterial blood gas (G3+)     Status: Abnormal   Collection Time: 06/08/18  5:47 AM  Result Value Ref Range   pH, Arterial 7.429 7.350 - 7.450   pCO2 arterial 24.0 (L) 32.0 - 48.0 mmHg   pO2, Arterial 104.0 83.0 - 108.0 mmHg   Bicarbonate 15.7 (L) 20.0 - 28.0 mmol/L   TCO2 16 (L) 22 - 32 mmol/L   O2 Saturation 98.0 %   Acid-base deficit 7.0 (H) 0.0 - 2.0 mmol/L   Patient temperature 37.9 C    Sample type ARTERIAL   Glucose, capillary     Status: Abnormal   Collection Time: 06/08/18  7:44 AM  Result Value Ref Range   Glucose-Capillary 105 (H) 70 - 99 mg/dL   Comment 1 Notify RN   I-STAT 3, arterial blood gas (G3+)     Status: Abnormal   Collection Time: 06/08/18  8:19 AM  Result Value Ref Range   pH, Arterial 7.452 (H) 7.350 - 7.450   pCO2 arterial 23.0 (L) 32.0 - 48.0 mmHg    pO2, Arterial 94.0 83.0 - 108.0 mmHg   Bicarbonate 16.2 (L) 20.0 - 28.0 mmol/L   TCO2 17 (L) 22 - 32 mmol/L   O2 Saturation 98.0 %   Acid-base deficit 7.0 (H) 0.0 - 2.0 mmol/L   Patient temperature 36.1 C    Collection site ARTERIAL LINE    Drawn by Operator    Sample type ARTERIAL      ROS:  Review of systems not obtained due to patient factors.  Physical Exam: Vitals:   06/08/18 1030 06/08/18 1100  BP:  110/60  Pulse: (!) 116 (!) 111  Resp: 18 17  Temp: 99.1 F (37.3 C) 98.8 F (37.1 C)  SpO2: 92% 96%     General: sedated, intubated HEENT: PERRLA, EOMI Neck: positive for JVD Heart: tachy  Lungs: CBS bilat  Abdomen: tight edema  Extremities: tight edema Skin: warm and dry Neuro: sedated   Assessment/Plan: 72 year old BM with CKD and history of PE who presented with PEA arrest, presumed PE with hemodynamic instability and A on CRF  1.Renal-acute on chronic kidney disease.  Not sure of the etiology of his chronic kidney disease.  However, his acute is most likely secondary to his multisystem organ failure and prolonged hypotension requiring pressors.  He is oliguric and now with a high potassium requires renal replacement therapy.  Given situation will initiate CRRT.  Appreciate CCM to place catheter 2. Hypertension/volume  -appears massively volume overloaded, receiving lots of drips and therefore lots of volume.  CVP has been near 20.  Will attempt volume removal after we ensure stability on CRRT 3.  Hyperkalemia-we will put on 0 K dialysate with recheck tonight 4. Anemia-situational and multifactorial.  Has been transfused this admission-supportive care, per CCM 5.  Metabolic acidosis-should improve with dialysis 6.  Hemodynamic instability-multiple pressors with inability to wean.  Cardiogenic versus septic shock per cardiology and CCM   Angelicia Lessner A Kodey Xue 06/08/2018, 11:41 AM

## 2018-06-08 NOTE — Progress Notes (Signed)
eLink Physician-Brief Progress Note Patient Name: Bryan Parker DOB: [birth year] MRN: [id number]   Date of Service  06/08/2018  HPI/Events of Note  Hgb 6.8. Hypotension  eICU Interventions  Transfuse one unit RBCs     Intervention Category Major Interventions: Hypotension - evaluation and management Intermediate Interventions: Bleeding - evaluation and treatment with blood products  Merwyn Katos 06/08/2018, 1:37 AM

## 2018-06-08 NOTE — Progress Notes (Signed)
Pharmacy Antibiotic Note  Bryan Parker is a 72 y.o. male admitted on 06/12/2018 with possible aspiration pna/sepsis. Now initiating CRRT, will change antibiotic dosing. Vanc random today 22, will hold PM vancomycin dose and give tomorrow AM. Tmax 100, WBC 26, lactate 2.8, PCT >150. No culture data to alter abx regimen, normal flora in tracheal aspirate.  Plan: Zosyn 2.25 q6h Vancomycin 1000mg  q24h  Height: 5\' 10"  (177.8 cm) Weight: 194 lb 7.1 oz (88.2 kg) IBW/kg (Calculated) : 73  Temp (24hrs), Avg:97.8 F (36.6 C), Min:95.9 F (35.5 C), Max:100.4 F (38 C)  Recent Labs  Lab 06/11/2018 1929  06/11/2018 2229  06/06/18 0414  06/06/18 2106 06/07/18 0408 06/07/18 1058 06/07/18 1234 06/08/18 0023 06/08/18 0441 06/08/18 1121 06/08/18 1130  WBC  --   --   --    < >  --    < > 27.5* 25.6*  --  23.5* 26.8* 26.0*  --   --   CREATININE  --    < >  --    < >  --    < > 4.53* 4.83*  --  5.23*  --  5.80* 5.31*  --   LATICACIDVEN 10.0*  --  9.9*  --  6.5*  --   --   --  2.8*  --   --   --  2.1*  --   VANCORANDOM  --   --   --   --   --   --   --   --   --   --   --   --   --  22   < > = values in this interval not displayed.    Estimated Creatinine Clearance: 14.3 mL/min (A) (by C-G formula based on SCr of 5.31 mg/dL (H)).    No Known Allergies  Thank you for involving pharmacy in this patient's care.  Wendelyn Breslow, PharmD PGY1 Pharmacy Resident Phone: [phone] 06/08/2018 2:48 PM

## 2018-06-09 ENCOUNTER — Inpatient Hospital Stay (HOSPITAL_COMMUNITY): Payer: Medicare Other

## 2018-06-09 DIAGNOSIS — I50810 Right heart failure, unspecified: Secondary | ICD-10-CM

## 2018-06-09 DIAGNOSIS — N179 Acute kidney failure, unspecified: Secondary | ICD-10-CM

## 2018-06-09 LAB — CBC
HCT: 22.7 % — ABNORMAL LOW (ref 39.0–52.0)
HEMATOCRIT: 19.9 % — AB (ref 39.0–52.0)
Hemoglobin: 7 g/dL — ABNORMAL LOW (ref 13.0–17.0)
Hemoglobin: 7.8 g/dL — ABNORMAL LOW (ref 13.0–17.0)
MCH: 28.2 pg (ref 26.0–34.0)
MCH: 28.8 pg (ref 26.0–34.0)
MCHC: 35.2 g/dL (ref 30.0–36.0)
MCHC: 34.4 g/dL (ref 30.0–36.0)
MCV: 80.2 fL (ref 80.0–100.0)
MCV: 83.8 fL (ref 80.0–100.0)
PLATELETS: 54 10*3/uL — AB (ref 150–400)
PLATELETS: 50 10*3/uL — AB (ref 150–400)
RBC: 2.48 MIL/uL — ABNORMAL LOW (ref 4.22–5.81)
RBC: 2.71 MIL/uL — AB (ref 4.22–5.81)
RDW: 16.5 % — AB (ref 11.5–15.5)
RDW: 15.8 % — ABNORMAL HIGH (ref 11.5–15.5)
WBC: 21.9 10*3/uL — ABNORMAL HIGH (ref 4.0–10.5)
WBC: 15.6 10*3/uL — ABNORMAL HIGH (ref 4.0–10.5)
nRBC: 0 % (ref 0.0–0.2)
nRBC: 0.1 % (ref 0.0–0.2)

## 2018-06-09 LAB — BPAM RBC
BLOOD PRODUCT EXPIRATION DATE: 201910282359
BLOOD PRODUCT EXPIRATION DATE: 201911042359
BLOOD PRODUCT EXPIRATION DATE: 201911152359
Blood Product Expiration Date: 201911022359
Blood Product Expiration Date: 201911082359
Blood Product Expiration Date: 201911152359
ISSUE DATE / TIME: 201910210959
ISSUE DATE / TIME: 201910211154
ISSUE DATE / TIME: 201910220152
ISSUE DATE / TIME: 201910212330
ISSUE DATE / TIME: 201910221521
ISSUE DATE / TIME: 201910230209
UNIT TYPE AND RH: 6200
Unit Type and Rh: 600
Unit Type and Rh: 6200
Unit Type and Rh: 6200
Unit Type and Rh: 6200
Unit Type and Rh: 6200

## 2018-06-09 LAB — GLUCOSE, CAPILLARY
GLUCOSE-CAPILLARY: 115 mg/dL — AB (ref 70–99)
GLUCOSE-CAPILLARY: 125 mg/dL — AB (ref 70–99)
GLUCOSE-CAPILLARY: 132 mg/dL — AB (ref 70–99)
Glucose-Capillary: 106 mg/dL — ABNORMAL HIGH (ref 70–99)
Glucose-Capillary: 92 mg/dL (ref 70–99)
Glucose-Capillary: 127 mg/dL — ABNORMAL HIGH (ref 70–99)

## 2018-06-09 LAB — TYPE AND SCREEN
ABO/RH(D): A POS
ANTIBODY SCREEN: NEGATIVE
UNIT DIVISION: 0
UNIT DIVISION: 0
UNIT DIVISION: 0
UNIT DIVISION: 0
Unit division: 0
Unit division: 0

## 2018-06-09 LAB — HEMOGLOBIN AND HEMATOCRIT, BLOOD
HCT: 21.5 % — ABNORMAL LOW (ref 39.0–52.0)
HEMATOCRIT: 24 % — AB (ref 39.0–52.0)
Hemoglobin: 7.6 g/dL — ABNORMAL LOW (ref 13.0–17.0)
Hemoglobin: 8.1 g/dL — ABNORMAL LOW (ref 13.0–17.0)

## 2018-06-09 LAB — POCT I-STAT 3, ART BLOOD GAS (G3+)
Acid-base deficit: 1 mmol/L (ref 0.0–2.0)
Bicarbonate: 22.4 mmol/L (ref 20.0–28.0)
Bicarbonate: 23.1 mmol/L (ref 20.0–28.0)
O2 SAT: 99 %
O2 Saturation: 99 %
PCO2 ART: 29.2 mmHg — AB (ref 32.0–48.0)
PO2 ART: 121 mmHg — AB (ref 83.0–108.0)
PO2 ART: 110 mmHg — AB (ref 83.0–108.0)
Patient temperature: 95.7
TCO2: 23 mmol/L (ref 22–32)
TCO2: 24 mmol/L (ref 22–32)
pCO2 arterial: 29.9 mmHg — ABNORMAL LOW (ref 32.0–48.0)
pH, Arterial: 7.487 — ABNORMAL HIGH (ref 7.350–7.450)
pH, Arterial: 7.495 — ABNORMAL HIGH (ref 7.350–7.450)

## 2018-06-09 LAB — RENAL FUNCTION PANEL
ANION GAP: 7 (ref 5–15)
ANION GAP: 8 (ref 5–15)
Albumin: 1.5 g/dL — ABNORMAL LOW (ref 3.5–5.0)
Albumin: 1.5 g/dL — ABNORMAL LOW (ref 3.5–5.0)
BUN: 35 mg/dL — ABNORMAL HIGH (ref 8–23)
BUN: 29 mg/dL — ABNORMAL HIGH (ref 8–23)
CALCIUM: 5.7 mg/dL — AB (ref 8.9–10.3)
CHLORIDE: 105 mmol/L (ref 98–111)
CHLORIDE: 105 mmol/L (ref 98–111)
CO2: 20 mmol/L — AB (ref 22–32)
CO2: 21 mmol/L — ABNORMAL LOW (ref 22–32)
CREATININE: 3.29 mg/dL — AB (ref 0.61–1.24)
Calcium: 5.7 mg/dL — CL (ref 8.9–10.3)
Creatinine, Ser: 4.12 mg/dL — ABNORMAL HIGH (ref 0.61–1.24)
GFR calc non Af Amer: 13 mL/min — ABNORMAL LOW (ref >60)
GFR, EST AFRICAN AMERICAN: 15 mL/min — AB (ref >60)
GFR, EST AFRICAN AMERICAN: 20 mL/min — AB (ref >60)
GFR, EST NON AFRICAN AMERICAN: 17 mL/min — AB (ref >60)
GLUCOSE: 116 mg/dL — AB (ref 70–99)
Glucose, Bld: 135 mg/dL — ABNORMAL HIGH (ref 70–99)
POTASSIUM: 4.8 mmol/L (ref 3.5–5.1)
Phosphorus: 5.6 mg/dL — ABNORMAL HIGH (ref 2.5–4.6)
Phosphorus: 5.7 mg/dL — ABNORMAL HIGH (ref 2.5–4.6)
Potassium: 5.5 mmol/L — ABNORMAL HIGH (ref 3.5–5.1)
Sodium: 132 mmol/L — ABNORMAL LOW (ref 135–145)
Sodium: 134 mmol/L — ABNORMAL LOW (ref 135–145)

## 2018-06-09 LAB — PROTIME-INR
INR: 1.17
PROTHROMBIN TIME: 14.8 s (ref 11.4–15.2)

## 2018-06-09 LAB — PREPARE RBC (CROSSMATCH)

## 2018-06-09 LAB — COOXEMETRY PANEL
CARBOXYHEMOGLOBIN: 1.4 % (ref 0.5–1.5)
Methemoglobin: 2.5 % — ABNORMAL HIGH (ref 0.0–1.5)
O2 SAT: 67 %
Total hemoglobin: 7.8 g/dL — ABNORMAL LOW (ref 12.0–16.0)

## 2018-06-09 LAB — HIV ANTIBODY (ROUTINE TESTING W REFLEX): HIV Screen 4th Generation wRfx: NONREACTIVE

## 2018-06-09 LAB — MAGNESIUM: Magnesium: 2.2 mg/dL (ref 1.7–2.4)

## 2018-06-09 LAB — HEPARIN LEVEL (UNFRACTIONATED)
HEPARIN UNFRACTIONATED: 0.29 [IU]/mL — AB (ref 0.30–0.70)
Heparin Unfractionated: 0.24 IU/mL — ABNORMAL LOW (ref 0.30–0.70)
Heparin Unfractionated: 0.43 IU/mL (ref 0.30–0.70)

## 2018-06-09 LAB — PHOSPHORUS: PHOSPHORUS: 5.5 mg/dL — AB (ref 2.5–4.6)

## 2018-06-09 MED ORDER — VITAL AF 1.2 CAL PO LIQD
1000.0000 mL | ORAL | Status: DC
  Administered 2018-06-09 – 2018-06-12 (×2): 1000 mL

## 2018-06-09 MED ORDER — SODIUM CHLORIDE 0.9% IV SOLUTION
Freq: Once | INTRAVENOUS | Status: AC
  Administered 2018-06-09: 13:00:00 via INTRAVENOUS

## 2018-06-09 MED ORDER — CHLORHEXIDINE GLUCONATE CLOTH 2 % EX PADS: 6.0000 | MEDICATED_PAD | Freq: Every day | CUTANEOUS | Status: DC

## 2018-06-09 MED ORDER — SODIUM CHLORIDE 0.9% IV SOLUTION
Freq: Once | INTRAVENOUS | Status: AC
  Administered 2018-06-09: 05:00:00 via INTRAVENOUS

## 2018-06-09 MED ORDER — CHLORHEXIDINE GLUCONATE CLOTH 2 % EX PADS
6.0000 | MEDICATED_PAD | Freq: Every day | CUTANEOUS | Status: DC
  Administered 2018-06-10 – 2018-06-28 (×19): 6 via TOPICAL

## 2018-06-09 MED ORDER — JEVITY 1.2 CAL PO LIQD: 1000.0000 mL | ORAL | Status: DC

## 2018-06-09 MED ORDER — LIDOCAINE HCL URETHRAL/MUCOSAL 2 % EX GEL
1.0000 "application " | Freq: Once | CUTANEOUS | Status: DC
  Filled 2018-06-09: qty 5

## 2018-06-09 MED ORDER — SODIUM CHLORIDE 0.9 % IV SOLN
INTRAVENOUS | Status: DC | PRN
  Administered 2018-06-19 (×2): 1000 mL via INTRAVENOUS

## 2018-06-09 MED ORDER — VITAL AF 1.2 CAL PO LIQD: 1000.0000 mL | ORAL | Status: DC

## 2018-06-09 MED ORDER — CALCIUM GLUCONATE-NACL 2-0.675 GM/100ML-% IV SOLN
2.0000 g | Freq: Once | INTRAVENOUS | Status: AC
  Administered 2018-06-09: 2000 mg via INTRAVENOUS
  Filled 2018-06-09: qty 100

## 2018-06-09 MED FILL — Medication: Qty: 1 | Status: AC

## 2018-06-09 NOTE — Progress Notes (Signed)
ANTICOAGULATION CONSULT NOTE   Pharmacy Consult for heparin Indication: suspected pulmonary embolus and confirmed DVT  No Known Allergies  Patient Measurements: Height: 5\' 10"  (177.8 cm) Weight: 190 lb 11.2 oz (86.5 kg) IBW/kg (Calculated) : 73  Vital Signs: Temp: 96.4 F (35.8 C) (10/24 0800) Temp Source: Core (10/24 0756) BP: 118/69 (10/24 0800) Pulse Rate: 84 (10/24 0800)  Labs: Recent Labs    06/07/18 0408  06/08/18 0023  06/08/18 0441 06/08/18 1000 06/08/18 1114 06/08/18 1121 06/08/18 1736 06/08/18 2021 06/09/18 0308 06/09/18 0311  HGB 9.1*   < > 6.8*  --  8.0*  --   --   --   --   --  7.0*  --   HCT 27.0*   < > 18.8*  --  22.7*  --   --   --   --   --  19.9*  --   PLT 60*   < > 56*  --  56*  --   --   --   --   --  54*  --   LABPROT 21.9*  --   --   --   --   --   --   --   --   --  14.8  --   INR 1.95  --   --   --   --   --   --   --   --   --  1.17  --   HEPARINUNFRC 0.74*   < >  --    < >  --  0.12*  --   --   --  0.25*  --  0.29*  CREATININE 4.83*   < >  --   --  5.80*  --   --  5.31* 4.96*  --  4.12*  --   CKTOTAL  --   --   --   --   --   --  14,417*  --   --   --   --   --   CKMB  --   --   --   --   --   --  135.4*  --   --   --   --   --    < > = values in this interval not displayed.    Estimated Creatinine Clearance: 17 mL/min (A) (by C-G formula based on SCr of 4.12 mg/dL (H)).   Medical History: Past Medical History:  Diagnosis Date  . ARF (acute renal failure) (HCC) 06/06/2018  . Hypertension   . RVF (right ventricular failure) (HCC) 06/06/2018  . Vitamin D deficiency     Assessment: 72yo male admitted on 10/20 after witnessed arrest w/ EMS then multiple arrests in ED. PMH includes known h/o PE/DVT, now off anticoagulation at least 1 year. Reported by wife to have c/o calf pain on 10/19 >> concern for PE and given TNKase in ED on 10/20. Patient found to have DVT. No chest CT done to r/o PE given AKI.  Pharmacy has been consulted for  heparin dosing.  10/24: Heparin level this morning slightly subtherpaeutic at 0.29 on 800 units/hr. Will continue targeting lower heparin levels of 0.3-0.5 given recent bleeding from multiple sites. Difficult situation given concerns for PE but active bleeding. Early this AM when changing coude, 600 mL of frank blood output, heparin turned off briefly. Hgb down to 7.0, plt 54 (prior to bleeding event), received 1 units pRBC, will receive another. Hb and platelets likely  low due to bleeding. HL rechecked, decreased to 0.24.  Goal of Therapy:  Heparin level 0.3-0.5 units/ml Monitor platelets by anticoagulation protocol: Yes   Plan:  Increase heparin infusion sligthly to 850 units/hr Check heparin level in 8 hours Monitor daily heparin level and CBC, s/sx of bleeding  Thank you for involving pharmacy in this patient's care.   Marcelino Freestone, PharmD PGY2 Cardiology Pharmacy Resident Phone [phone] Please check AMION for all Pharmacist numbers by unit 06/09/2018 8:57 AM

## 2018-06-09 NOTE — Progress Notes (Signed)
Patient ID: Bryan Parker, male   DOB: 03-12-1946, 72 y.o.   MRN: [id number]     Advanced Heart Failure Rounding Note  PCP-Cardiologist: Fransico Him, MD   Subjective:    Patient still has urethral bleeding, hgb 7 this morning and got another unit of PRBCs.  Heparin held for a while, now back on.    Angiotensin gtt Darryl Nestle) started yesterday.  We were able to decrease norepinephrine and epinephrine.  Currently: Giapreza 15, norepinephrine 20, epinephrine 10, dobutamine 20, vasopressin 0.03.  BP much more stable today.   CVP 16 this morning, now on CVVH pulling 50 cc/hr UF.    WBC 25.6 > 26 > 22. Afebrile. PCT >150 10/22. Remains on Vanc and Zosyn. Sputum + mod gram positive cocci, no culture data.    Remains intubated and sedated. FiO2 40%. On iNO 30 ppm.  ABG: pH 7.42, pCO2 24, pO2 104, Bicarb 16  Objective:   Weight Range: 86.5 kg Body mass index is 27.36 kg/m.   Vital Signs:   Temp:  [95.5 F (35.3 C)-99.7 F (37.6 C)] 96.3 F (35.7 C) (10/24 0700) Pulse Rate:  [84-122] 86 (10/24 0700) Resp:  [15-36] 19 (10/24 0700) BP: (93-148)/(55-108) 132/76 (10/24 0748) SpO2:  [87 %-100 %] 100 % (10/24 0748) Arterial Line BP: (91-153)/(47-76) 136/60 (10/24 0700) FiO2 (%):  [40 %] 40 % (10/24 0748) Weight:  [86.5 kg] 86.5 kg (10/24 0500) Last BM Date: 06/08/18  Weight change: Filed Weights   06/07/18 0630 06/08/18 0615 06/09/18 0500  Weight: 82.1 kg 88.2 kg 86.5 kg    Intake/Output:   Intake/Output Summary (Last 24 hours) at 06/09/2018 0752 Last data filed at 06/09/2018 0700 Gross per 24 hour  Intake 4588.48 ml  Output 4878 ml  Net -289.52 ml      Physical Exam    General: Intubated/asleep Neck: JVP 12+ cm, no thyromegaly or thyroid nodule.  Lungs: Decreased BS dependently CV: Nondisplaced PMI.  Heart regular S1/S2, no S3/S4, no murmur.  No peripheral edema.   Abdomen: Soft, no hepatosplenomegaly, no distention.  Skin: Intact without lesions or rashes.    Neurologic: Intubated/sedated.  Extremities: No clubbing or cyanosis.  HEENT: Normal.    Telemetry   Sinus tach 90s. Personally reviewed.   EKG    No new tracings.   Labs    CBC Recent Labs    06/08/18 0441 06/09/18 0308  WBC 26.0* 21.9*  NEUTROABS 16.1*  --   HGB 8.0* 7.0*  HCT 22.7* 19.9*  MCV 81.4 80.2  PLT 56* 54*   Basic Metabolic Panel Recent Labs    06/08/18 0441  06/08/18 1736 06/09/18 0308  NA 134*   < > 133* 132*  K 6.4*   < > 6.0* 5.5*  CL 107   < > 104 105  CO2 16*   < > 19* 20*  GLUCOSE 124*   < > 123* 116*  BUN 49*   < > 44* 35*  CREATININE 5.80*   < > 4.96* 4.12*  CALCIUM 5.1*   < > 5.6* 5.7*  MG 1.9  --   --  2.2  PHOS 6.8*  --  6.8* 5.5*  5.6*   < > = values in this interval not displayed.   Liver Function Tests Recent Labs    06/07/18 0812 06/08/18 0441 06/08/18 1736 06/09/18 0308  AST 271* 279*  --   --   ALT 166* 145*  --   --   ALKPHOS 45 48  --   --  BILITOT 2.0* 2.3*  --   --   PROT <3.0* 3.3*  --   --   ALBUMIN 1.4* 1.4* 1.5* 1.5*   Recent Labs    06/08/18 1114  LIPASE 23   Cardiac Enzymes Recent Labs    06/06/18 0807 06/08/18 1114  CKTOTAL  --  14,417*  CKMB  --  135.4*  TROPONINI 11.98*  --     BNP: BNP (last 3 results) Recent Labs    05/27/2018 0854  BNP 18.0    ProBNP (last 3 results) No results for input(s): PROBNP in the last 8760 hours.   D-Dimer No results for input(s): DDIMER in the last 72 hours. Hemoglobin A1C No results for input(s): HGBA1C in the last 72 hours. Fasting Lipid Panel No results for input(s): CHOL, HDL, LDLCALC, TRIG, CHOLHDL, LDLDIRECT in the last 72 hours. Thyroid Function Tests No results for input(s): TSH, T4TOTAL, T3FREE, THYROIDAB in the last 72 hours.  Invalid input(s): FREET3  Other results:   Imaging    Dg Chest Port 1 View  Result Date: 06/08/2018 CLINICAL DATA:  Status post central line placement today. EXAM: PORTABLE CHEST 1 VIEW COMPARISON:   Single-view of the chest earlier today. FINDINGS: New double lumen central venous catheter from right IJ approach is in place with its tip in the mid to lower superior vena cava. Endotracheal tube and NG tube are unchanged. Defibrillator pad remains in place. Bilateral airspace disease and effusions are unchanged. No pneumothorax. IMPRESSION: Tip of new right IJ catheter projects in the mid to lower superior vena cava. No pneumothorax. No other change. Electronically Signed   By: Inge Rise M.D.   On: 06/08/2018 11:12     Medications:     Scheduled Medications: . chlorhexidine gluconate (MEDLINE KIT)  15 mL Mouth Rinse BID  . Chlorhexidine Gluconate Cloth  6 each Topical Daily  . insulin aspart  1-3 Units Subcutaneous Q4H  . insulin detemir  5 Units Subcutaneous Q12H  . lidocaine  1 application Urethral Once  . lidocaine  1 application Urethral Once  . mouth rinse  15 mL Mouth Rinse 10 times per day  . sodium chloride flush  10-40 mL Intracatheter Q12H  . THROMBI-PAD  1 each Topical Once    Infusions: . sodium chloride Stopped (06/08/18 0557)  . angiotensin II (GIAPREZA) infusion 15 ng/kg/min (06/09/18 0700)  . DOBUTamine 20 mcg/kg/min (06/09/18 0700)  . epinephrine 10 mcg/min (06/09/18 0701)  . famotidine (PEPCID) IV Stopped (06/08/18 0943)  . fentaNYL infusion INTRAVENOUS 100 mcg/hr (06/07/18 0405)  . heparin 800 Units/hr (06/09/18 0700)  . midazolam (VERSED) infusion 4 mg/hr (06/09/18 0725)  . norepinephrine (LEVOPHED) Adult infusion 20 mcg/min (06/09/18 0700)  . piperacillin-tazobactam (ZOSYN)  IV Stopped (06/09/18 7412)  . dialysate (PRISMASATE) 1,500 mL/hr at 06/09/18 0553  . dialysis replacement fluid (prismasate) 300 mL/hr at 06/09/18 0553  . dialysis replacement fluid (prismasate) 350 mL/hr at 06/09/18 0409  . sodium chloride    . sodium chloride    . vancomycin    . vasopressin (PITRESSIN) infusion - *FOR SHOCK* 0.03 Units/min (06/09/18 0700)    PRN  Medications: fentaNYL (SUBLIMAZE) injection, heparin, midazolam, sodium chloride, sodium chloride flush    Patient Profile   Bryan Parker is a 72 y.o. 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  Presented to [facility] after PEA arrest with 10+ minutes down, presumed large PE. Now with RV failure.  Assessment/Plan  1. Shock: Think this has a mixed origin.  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 Suspect also component of septic shock with very high PCT, fever, and presumed PNA.  Echo 10/2014 showed EF 60%, RV normal, PA peak pressure 73 mmHg.  Echo 05/22/2018 showed LV EF 55-60%, RV severely dilated and dysfunctional, moderate TR, moderately dilated RA, PA peak pressure 31 mmHg.  Now requiring multiple pressors/inotropes, MAP improved with use of Giapreza.  CVP 16 today.  - On CVVH, with stable BP can increase UF to 75-100 cc/hr.   - On dobutamine 20, NE 20, epi 10, vaso 0.03, Giapreza 15.  This is improved since yesterday.  Continue to wean pressors as able.  - Continue iNO 30 ppm for now.  - Did not place RV impella with extensive bleeding post-TNKase and extensive clot.  - Send co-ox today.  2. PEA arrest: Suspect secondary to massive PE.  Received 10+ minutes CPR. Head CT negative. Seems to be neurologically intact. No change.  3. Presumed massive PE: Has hx of PE in 2016, but has not been on chronic anticoagulation (off >1 year).  Lower extremity dopplers this admission positive for left popliteal acute DVT.  Received TNKase in ED.  - Now on heparin drip.  4. ID: WBCs elevated but now afebrile.  PCT was > 150.  Concerned for HCAP, possibly in setting of aspiration.  Cultures remain negative.  - On Zosyn and Vanc. 5. AKI on CKD: Baseline creatinine looks to be 1.6-2.2 range (cause of baseline CKD uncertain). He is now on CVVH with rising creatinine, poor UOP, and volume overload. Renal US negative for  hydronephrosis.  AKI due to shock.  - Continue CVVH, can increase UF to 75-100 cc/hr.  6. Anemia: Bleeding from OGT and urethra post-TNKase.  OGT bleeding has stopped but still with urethral bleeding.  He has received multiple units PRBCs, hgb 7 and getting 1 unit this morning.  - Will need urology to see with ongoing hematuria.  7. Elevated liver enzymes: In setting of shock (ischemic hepatitis). Trend.   8. Hypocalcemia: Replace.  9. Acute respiratory hypoxemic respiratory failure: Remains intubated, suspect due to combination of PNA and pulmonary edema.  - Remains intubated. FiO2 40% 10. Thrombocytopenia: Plts stable at 54 today.  Suspect fall in plts in setting of sepsis, critical illness/inflammation.   CRITICAL CARE Performed by: Loralie Champagne  Total critical care time: 40 minutes  Critical care time was exclusive of separately billable procedures and treating other patients.  Critical care was necessary to treat or prevent imminent or life-threatening deterioration.  Critical care was time spent personally by me on the following activities: development of treatment plan with patient and/or surrogate as well as nursing, discussions with consultants, evaluation of patient's response to treatment, examination of patient, obtaining history from patient or surrogate, ordering and performing treatments and interventions, ordering and review of laboratory studies, ordering and review of radiographic studies, pulse oximetry and re-evaluation of patient's condition.   Length of Stay: 4  Loralie Champagne, MD  06/09/2018, 7:52 AM  Advanced Heart Failure Team Pager [phone] (M-F; 7a - 4p)  Please contact [facility] for night-coverage after hours (4p -7a ) and weekends on [email or web address]

## 2018-06-09 NOTE — Progress Notes (Signed)
NAME:  MIKHAIL HALLENBECK, MRN:  [id number], DOB:  [birth year], LOS: 4 ADMISSION DATE:  06/13/2018, CONSULTATION DATE:  05/26/2018 REFERRING MD:  Dr. Rhunette Croft , CHIEF COMPLAINT:  Cardiac Arrest    Brief History   72 year old male presents to ED s/p Cardiac Arrest. Per Report patient called EMS for Abdominal Pain/Nausea/Vomiting. When EMS arrived patient was alert however went pulseless and PEA while on scene. ROSC achieved after 2 EPI and 10 minutes CPR. On arrival patient continued to be unstable and required multiple rounds of CPR. Fast exam negative. Bedside ECHO revealed slightly dilated RV. ABG 6.914/69.5/368. Progressive hypotension requiring Levophed/EPI/Vasopresin and 9 Bicarb pushes.   Wife reports that patient was complaining of severe left calf pain all day 10/19. Has a history of PE 2016. Was taken off anticoagulation 2018. Due to high suspicion of PE TNKase was given in ED. PCCM asked to admit.   Past Medical History  H/O DVT/PE, CKD  Significant Hospital Events   10/20 > Presents to ED  1021 with a partial code no CPR 10/22 bleeding from IV sites and foley, heparin d/c'd for 2 hours then resumed. Hgb 6.8 > 1u PRBC transfused  Consults: date of consult/date signed off & final recs:  PCCM 10/20 Heart failure 10/21 >   Procedures (surgical and bedside):  ETT 10/20 >> Right Femoral CVC 10/20 >> Left Femoral Aline 10/20 >>   Significant Diagnostic Tests:  CXR 10/20 > neg CT Head 10/20 >> neg Echo 10/20 > EF 55 - 60%, severely dilated RV, mod dilated RA, mod TR.  Micro Data:  Blood 10/20 >> Sputum 10/20 >>  U/A 10/20 >>   Antimicrobials:  zosyn 10/20 >> vanc 10/20 >>  Subjective:  Pressor requirements much improved after giaprezza added.  Epi / vaso / norepi all cut in half. Tolerating CRRT well.  Renal function very mildly improved.  CK checked and noted to be high at 14, 417 Had urethral bleeding overnight and received 1u PRBC.  Will consult urology.  Objective     Blood pressure 118/69, pulse 89, temperature (!) 96.6 F (35.9 C), resp. rate 18, height 5\' 10"  (1.778 m), weight 86.5 kg, SpO2 100 %. CVP:  [13 mmHg-67 mmHg] 15 mmHg  Vent Mode: PRVC FiO2 (%):  [40 %] 40 % Set Rate:  [14 bmp-18 bmp] 14 bmp Vt Set:  [500 mL] 500 mL PEEP:  [5 cmH20] 5 cmH20 Pressure Support:  [5 cmH20] 5 cmH20 Plateau Pressure:  [21 cmH20-25 cmH20] 24 cmH20   Intake/Output Summary (Last 24 hours) at 06/09/2018 1019 Last data filed at 06/09/2018 1000 Gross per 24 hour  Intake 4628.46 ml  Output 5691 ml  Net -1062.54 ml   Filed Weights   06/07/18 0630 06/08/18 0615 06/09/18 0500  Weight: 82.1 kg 88.2 kg 86.5 kg    Examination: General: Adult male, appears younger than stated age, resting in bed, in NAD. Neuro: Sedated but opens eyes to voice, follows some basic commands intermittently. HEENT: /AT. Sclerae anicteric, EOMI. Cardiovascular: RRR, no M/R/G.  Lungs: Respirations even and unlabored.  CTA bilaterally, No W/R/R. Abdomen: BS x 4, abd tight but not distended.  Musculoskeletal: No gross deformities, 2+ edema.  Skin: Intact, warm, no rashes.  06/07/2018 chest x-ray reviewed endotracheal tube in adequate position, orogastric to pass through, suspect left pleural effusion and atelectasis both bases  Assessment & Plan:   Respiratory insufficiency - s/p intubation in ED. - Maintain full vent support, not ready to wean given pressor  requirements. - Bronchial hygiene. - Follow CXR.  Respiratory alkalosis. - RR dropped from 18 to 14 and sedation increased given that he is breathing over set rate (in high 20s).  - Repeat ABG in 1 hour.  PEA Cardiac Arrest - concern due to massive PE.  Deemed to not be a TTM candidate due to shock. Shock - likely mixed, cardiogenic due to above and likely septic with unclear etiology given PCT > 150. - Continue dobutamine, norepi, vaso, epi, giaprezza. - Cardiology following.  Concern for PE - s/p TPA in ED. LLE  DVT. Hx PE / DVT 2016 (no longer on anticoagulation). - Continue heparin gtt.  Septic shock - no clear source.  PCT > 150. - Continue antibiotics (vanc / zosyn). - Monitor culture data.  AKI on Chronic Kidney Failure - worsening.  Concern due to rhabdo given CK 14, 417. Severe Anion Gap Metabolic Acidosis - Lactate + Renal failure.  Improving. Hyponatremia. Hyperkalemia - s/p temporizing measures. Hypocalcemia. - Nephrology following, appreciate the assistance.  Discussed with Dr. Kathrene Bongo in regards to additional fluid boluses vs continued CRRT with vol removal given rhabdo and decided to continue with current plan of CRRT with gentle volume removal.  Will follow daily CK and assess response. - Assess ionized calcium. - Correct electrolytes as indicated. - Follow BMP.  Acute blood loss anemia - had bleeding from IV sites and foley overnight 10/22.  Received 1u PRBC and repeat Hgb 8.0.  Had urethral bleeding overnight 10/23 and received an additional 1u PRBC. - Transfuse for Hgb < 7.  Urethral bleeding - likely traumatic following tPA administration. - Will consult urology.  Thrombocytopenia. - Might need platelets if has continued bleeding.  Hyperglycemia. - Continue SSI.  Acute Encephalopathy - metabolic + from PEA arrest. - Continue supportive care. - Wean sedation as able.   Disposition / Summary of Today's Plan 06/09/18   Continue CRRT.  Continue to wean pressors (epi / norepi / vaso have been cut in half after addition of giaprezza). Follow daily CK. Consult urology for urethral bleeding.  No further escalation. Discussed with wife 10/23 and 10/24.     Code Status: 06/07/2018 partial code no CPR Family Communication: 10/23 updated wife at bedside.  Labs   CBC: Recent Labs  Lab 05/30/2018 0439  06/07/18 0408 06/07/18 1234 06/08/18 0023 06/08/18 0441 06/09/18 0308 06/09/18 0921  WBC 8.9   < > 25.6* 23.5* 26.8* 26.0* 21.9*  --   NEUTROABS 2.6  --   --    --   --  16.1*  --   --   HGB 11.6*   < > 9.1* 6.6* 6.8* 8.0* 7.0* 7.6*  HCT 39.6   < > 27.0* 19.2* 18.8* 22.7* 19.9* 21.5*  MCV 99.7   < > 84.4 82.4 81.7 81.4 80.2  --   PLT 201   < > 60* 55* 56* 56* 54*  --    < > = values in this interval not displayed.    Basic Metabolic Panel: Recent Labs  Lab 06/06/18 0124  06/07/18 0408 06/07/18 1234 06/08/18 0441 06/08/18 1121 06/08/18 1736 06/09/18 0308  NA 134*   < > 134* 135 134* 136 133* 132*  K 2.8*   < > 4.7 5.6* 6.4* 5.6* 6.0* 5.5*  CL 99   < > 103 108 107 111 104 105  CO2 21*   < > 17* 17* 16* 16* 19* 20*  GLUCOSE 380*   < > 77 98 124* 76 123*  116*  BUN 33*   < > 36* 41* 49* 46* 44* 35*  CREATININE 4.00*   < > 4.83* 5.23* 5.80* 5.31* 4.96* 4.12*  CALCIUM 6.5*   < > 5.3* 5.4* 5.1* 4.9* 5.6* 5.7*  MG 1.4*  --  1.7 2.0 1.9  --   --  2.2  PHOS 1.1*  --  6.7*  --  6.8*  --  6.8* 5.5*  5.6*   < > = values in this interval not displayed.   GFR: Estimated Creatinine Clearance: 17 mL/min (A) (by C-G formula based on SCr of 4.12 mg/dL (H)). Recent Labs  Lab 06/12/2018 0854  06/11/2018 2229 06/06/18 0124 06/06/18 0414  06/07/18 0408 06/07/18 1058 06/07/18 1234 06/08/18 0023 06/08/18 0441 06/08/18 1121 06/09/18 0308  PROCALCITON 0.38  --   --  >150.00  --   --  >150.00  --   --   --   --   --   --   WBC 18.4*   < >  --  30.5*  --    < > 25.6*  --  23.5* 26.8* 26.0*  --  21.9*  LATICACIDVEN 18.2*   < > 9.9*  --  6.5*  --   --  2.8*  --   --   --  2.1*  --    < > = values in this interval not displayed.    Liver Function Tests: Recent Labs  Lab 06/15/2018 0439 06/09/2018 1515 06/06/18 0807 06/07/18 0812 06/08/18 0441 06/08/18 1736 06/09/18 0308  AST 181* 1,122* 485* 271* 279*  --   --   ALT 141* 549* 266* 166* 145*  --   --   ALKPHOS 138* 136* 62 45 48  --   --   BILITOT 0.6 1.2 1.0 2.0* 2.3*  --   --   PROT 6.0* 4.0* <3.0* <3.0* 3.3*  --   --   ALBUMIN 3.2* 2.2* 1.5* 1.4* 1.4* 1.5* 1.5*   Recent Labs  Lab  06/08/18 1114  LIPASE 23   No results for input(s): AMMONIA in the last 168 hours.  ABG    Component Value Date/Time   PHART 7.487 (H) 06/09/2018 0452   PCO2ART 29.2 (L) 06/09/2018 0452   PO2ART 121.0 (H) 06/09/2018 0452   HCO3 22.4 06/09/2018 0452   TCO2 23 06/09/2018 0452   ACIDBASEDEF 1.0 06/09/2018 0452   O2SAT 67.0 06/09/2018 0800     Coagulation Profile: Recent Labs  Lab 05/29/2018 0439 06/09/2018 0854 06/07/18 0408 06/09/18 0308  INR 1.56 >10.00* 1.95 1.17    Cardiac Enzymes: Recent Labs  Lab 05/17/2018 0854 05/22/2018 1935 06/06/18 0124 06/06/18 0807 06/08/18 1114  CKTOTAL  --   --   --   --  14,417*  CKMB  --   --   --   --  135.4*  TROPONINI 1.98* 24.95* 20.53* 11.98*  --     HbA1C: No results found for: HGBA1C  CBG: Recent Labs  Lab 06/08/18 1551 06/08/18 1610 06/08/18 2024 06/08/18 2354 06/09/18 0758  GLUCAP 98 106* 106* 115* 92    CC time: 35 min  [name], PA - [facility] Pager: [phone]  or [phone] 06/09/2018, 10:19 AM

## 2018-06-09 NOTE — Progress Notes (Signed)
CRITICAL VALUE ALERT  Critical Value:  Ca 5.7  Date & Time Notied:  06/09/2018 0349  Provider Notified: [place] A.  Orders Received/Actions taken: no new orders given, will continue to monitor

## 2018-06-09 NOTE — Progress Notes (Signed)
eLink Physician-Brief Progress Note Patient Name: Bryan Parker DOB: 1945-11-14 MRN: [id number]   Date of Service  06/09/2018  HPI/Events of Note  Noted of hematuria after non-functioning catheter removed.  eICU Interventions  Hold heparin drip for now. Re-check CBC and coags. Re-insert coudae once results are not too deranged.     Intervention Category Major Interventions: Hemorrhage - evaluation and management  Rosalie Gums [place] K[place] 06/09/2018, 3:05 AM

## 2018-06-09 NOTE — Progress Notes (Addendum)
Nutrition Consult/Follow Up  DOCUMENTATION CODES:   Not applicable  INTERVENTION:    Initiate Vital AF 1.2 at goal rate of 55 ml/h (1320 ml per day)   Provides 1584 kcals, 99 gm protein, 1070 ml free water daily  NUTRITION DIAGNOSIS:   Inadequate oral intake related to inability to eat as evidenced by NPO status, ongoing  GOAL:   Patient will meet greater than or equal to 90% of their needs, currently unmet  MONITOR:   Vent status, TF tolerance, Labs, Skin, Weight trends, I & O's  ASSESSMENT:   72 y.o. male with hx of HTN, CKD, lg PE 2016 and no longer anticoagulated. He was admitted 10/20 after PEA cardiac arrest. Coded repeatedly in the ER. High suspicion of PE, TNKase given in ED.    Patient is currently intubated on ventilator support MV: 11.0 L/min Temp (24hrs), Avg:97 F (36.1 C), Min:95.5 F (35.3 C), Max:98.2 F (36.8 C)  OGT in place  Initial assessment completed 10/21. Eating well PTA. Likes Ensure supplements. No recent unintentional weight loss.  Cardiogenic shock due to RV failure. Presumed massive PE.[name]  Acute on CKD. Volume overload. Started on CRRT 10/23. Labs reviewed: P 5.5 (H). Na 132 (L). K 5.5 (H).  Advanced Heart Failure note reviewed. Medications include IV Pepcid.  CBG's [id number].  Verbal with Read Back order received per Dr. Marchelle Gearing for TF initiation.  Diet Order:   Diet Order            Diet NPO time specified  Diet effective now             EDUCATION NEEDS:   Not appropriate for education at this time  Skin:  Skin Assessment: Reviewed RN Assessment  Last BM:  10/24    Intake/Output Summary (Last 24 hours) at 06/09/2018 1511 Last data filed at 06/09/2018 1500 Gross per 24 hour  Intake 4758.18 ml  Output 6159 ml  Net -1400.82 ml   Height:   Ht Readings from Last 1 Encounters:  06/14/2018 5\' 10"  (1.778 m)   Weight:   Wt Readings from Last 1 Encounters:  06/09/18 86.5 kg  05/19/2018          62.1 kg  Ideal  Body Weight:  75.45 kg  BMI:  Body mass index is 27.36 kg/m.  Estimated Nutritional Needs (using admit weight):   Kcal:  1600  Protein:  95-110 gm  Fluid:  per MD  Maureen Chatters, RD, LDN Pager #: [phone] After-Hours Pager #: [phone]

## 2018-06-09 NOTE — Progress Notes (Signed)
Subjective:  Remains hemodynamically unstable overnight , CRRT running well - a lot of hematuria after nonfunctional foley cath removed- urology to be called, nothing in at present- weaning pressors  Objective Vital signs in last 24 hours: Vitals:   06/09/18 0615 06/09/18 0630 06/09/18 0645 06/09/18 0700  BP:      Pulse: 87 84 86 86  Resp: 16 17 18 19   Temp: (!) 95.9 F (35.5 C) (!) 95.9 F (35.5 C) (!) 96.1 F (35.6 C) (!) 96.3 F (35.7 C)  TempSrc:      SpO2: 100% 100% 100% 100%  Weight:      Height:       Weight change: -1.7 kg  Intake/Output Summary (Last 24 hours) at 06/09/2018 0714 Last data filed at 06/09/2018 0700 Gross per 24 hour  Intake 4588.48 ml  Output 4878 ml  Net -289.52 ml    Assessment/Plan: 72 year old BM with CKD and history of PE who presented with PEA arrest, presumed PE with hemodynamic instability and A on CRF  1.Renal-acute on chronic kidney disease.  Not sure of the etiology of his chronic kidney disease.  However, his acute is most likely secondary to his multisystem organ failure and prolonged hypotension requiring pressors.  He is oliguric  now requiring  renal replacement therapy.  CRRT started 10/23.  Running well, no heparin, zero K dialysate but other bags 4 K- given still sort of high K will continue this as is 2. Hypertension/volume  -appears massively volume overloaded, receiving lots of drips and therefore lots of volume.  CVP has been near 20.  tolerating neg 50 per hour, cont for now, assess later for ability to pull more 3.  Hyperkalemia-cont 0 K dialysate with recheck tonight 4. Anemia-situational and multifactorial.  Has been transfused this admission-supportive care, per CCM- req blood this AM 5.  Metabolic acidosis- improved 6.  Hemodynamic instability-multiple pressors with inability to wean.  Cardiogenic versus septic shock per cardiology and CCM- some progress 7. Gross hematuria-  Renal u/s normal (no hydro) on 10/20, could he now  have clot obstructing ? - urology eval pending    Louis Meckel    Labs: Basic Metabolic Panel: Recent Labs  Lab 06/08/18 0441 06/08/18 1121 06/08/18 1736 06/09/18 0308  NA 134* 136 133* 132*  K 6.4* 5.6* 6.0* 5.5*  CL 107 111 104 105  CO2 16* 16* 19* 20*  GLUCOSE 124* 76 123* 116*  BUN 49* 46* 44* 35*  CREATININE 5.80* 5.31* 4.96* 4.12*  CALCIUM 5.1* 4.9* 5.6* 5.7*  PHOS 6.8*  --  6.8* 5.5*  5.6*   Liver Function Tests: Recent Labs  Lab 06/06/18 0807 06/07/18 0812 06/08/18 0441 06/08/18 1736 06/09/18 0308  AST 485* 271* 279*  --   --   ALT 266* 166* 145*  --   --   ALKPHOS 62 45 48  --   --   BILITOT 1.0 2.0* 2.3*  --   --   PROT <3.0* <3.0* 3.3*  --   --   ALBUMIN 1.5* 1.4* 1.4* 1.5* 1.5*   Recent Labs  Lab 06/08/18 1114  LIPASE 23   No results for input(s): AMMONIA in the last 168 hours. CBC: Recent Labs  Lab 06/12/2018 0439  06/07/18 0408 06/07/18 1234 06/08/18 0023 06/08/18 0441 06/09/18 0308  WBC 8.9   < > 25.6* 23.5* 26.8* 26.0* 21.9*  NEUTROABS 2.6  --   --   --   --  16.1*  --  HGB 11.6*   < > 9.1* 6.6* 6.8* 8.0* 7.0*  HCT 39.6   < > 27.0* 19.2* 18.8* 22.7* 19.9*  MCV 99.7   < > 84.4 82.4 81.7 81.4 80.2  PLT 201   < > 60* 55* 56* 56* 54*   < > = values in this interval not displayed.   Cardiac Enzymes: Recent Labs  Lab 05/23/2018 0854 06/14/2018 1935 06/06/18 0124 06/06/18 0807 06/08/18 1114  CKTOTAL  --   --   --   --  14,417*  CKMB  --   --   --   --  135.4*  TROPONINI 1.98* 24.95* 20.53* 11.98*  --    CBG: Recent Labs  Lab 06/08/18 1143 06/08/18 1551 06/08/18 1610 06/08/18 2024 06/08/18 2354  GLUCAP 76 98 106* 106* 115*    Iron Studies: No results for input(s): IRON, TIBC, TRANSFERRIN, FERRITIN in the last 72 hours. Studies/Results: Dg Chest Port 1 View  Result Date: 06/08/2018 CLINICAL DATA:  Status post central line placement today. EXAM: PORTABLE CHEST 1 VIEW COMPARISON:  Single-view of the chest earlier  today. FINDINGS: New double lumen central venous catheter from right IJ approach is in place with its tip in the mid to lower superior vena cava. Endotracheal tube and NG tube are unchanged. Defibrillator pad remains in place. Bilateral airspace disease and effusions are unchanged. No pneumothorax. IMPRESSION: Tip of new right IJ catheter projects in the mid to lower superior vena cava. No pneumothorax. No other change. Electronically Signed   By: Inge Rise M.D.   On: 06/08/2018 11:12   Dg Chest Port 1 View  Result Date: 06/08/2018 CLINICAL DATA:  Intubation. EXAM: PORTABLE CHEST 1 VIEW COMPARISON:  06/07/2018. FINDINGS: Endotracheal tube and NG tube in stable position. Heart size normal. Diffuse bilateral pulmonary infiltrates/edema and bilateral pleural effusions. IMPRESSION: 1.  Lines and tubes stable position. 2. Diffuse bilateral pulmonary infiltrates/edema and pleural effusions. Findings have progressed from prior exam. Electronically Signed   By: [place]   On: 06/08/2018 10:55   Medications: Infusions: . sodium chloride Stopped (06/08/18 0557)  . angiotensin II (GIAPREZA) infusion 15 ng/kg/min (06/09/18 0700)  . DOBUTamine 20 mcg/kg/min (06/09/18 0700)  . epinephrine 10 mcg/min (06/09/18 0701)  . famotidine (PEPCID) IV Stopped (06/08/18 0943)  . fentaNYL infusion INTRAVENOUS 100 mcg/hr (06/07/18 0405)  . heparin 800 Units/hr (06/09/18 0700)  . midazolam (VERSED) infusion 8 mg/hr (06/09/18 0700)  . norepinephrine (LEVOPHED) Adult infusion 20 mcg/min (06/09/18 0700)  . piperacillin-tazobactam (ZOSYN)  IV Stopped (06/09/18 4174)  . dialysate (PRISMASATE) 1,500 mL/hr at 06/09/18 0553  . dialysis replacement fluid (prismasate) 300 mL/hr at 06/09/18 0553  . dialysis replacement fluid (prismasate) 350 mL/hr at 06/09/18 0409  . sodium chloride    . sodium chloride    . vancomycin    . vasopressin (PITRESSIN) infusion - *FOR SHOCK* 0.03 Units/min (06/09/18 0700)    Scheduled  Medications: . chlorhexidine gluconate (MEDLINE KIT)  15 mL Mouth Rinse BID  . Chlorhexidine Gluconate Cloth  6 each Topical Daily  . insulin aspart  1-3 Units Subcutaneous Q4H  . insulin detemir  5 Units Subcutaneous Q12H  . lidocaine  1 application Urethral Once  . lidocaine  1 application Urethral Once  . mouth rinse  15 mL Mouth Rinse 10 times per day  . sodium chloride flush  10-40 mL Intracatheter Q12H  . THROMBI-PAD  1 each Topical Once    have reviewed scheduled and prn medications.  Physical Exam: General:  sedated , intubated  Heart: RRR Lungs: CBS bilat Abdomen: distended Extremities: pitting edema Dialysis Access: right IJ temp cath placed 10/23    06/09/2018,7:14 AM  LOS: 4 days

## 2018-06-09 NOTE — Progress Notes (Signed)
Patient's coude was not working, order received to replace coude. Coude team pulled out catheter, blood started streaming out of penis. E-Link MD notified. See new orders.

## 2018-06-09 NOTE — Progress Notes (Signed)
ANTICOAGULATION CONSULT NOTE   Pharmacy Consult for heparin Indication: suspected pulmonary embolus and confirmed DVT  No Known Allergies  Patient Measurements: Height: 5\' 10"  (177.8 cm) Weight: 190 lb 11.2 oz (86.5 kg) IBW/kg (Calculated) : 73  Vital Signs: Temp: 97.3 F (36.3 C) (10/24 2200) Temp Source: Esophageal (10/24 2000) BP: 92/58 (10/24 2200) Pulse Rate: 86 (10/24 2200)  Labs: Recent Labs    06/07/18 0408  06/08/18 0441  06/08/18 1114  06/08/18 1736  06/09/18 0308 06/09/18 0311 06/09/18 0921 06/09/18 1548 06/09/18 1600 06/09/18 1941 06/09/18 2125  HGB 9.1*   < > 8.0*  --   --   --   --   --  7.0*  --  7.6* 8.1*  --   --  7.8*  HCT 27.0*   < > 22.7*  --   --   --   --   --  19.9*  --  21.5* 24.0*  --   --  22.7*  PLT 60*   < > 56*  --   --   --   --   --  54*  --   --   --   --   --  50*  LABPROT 21.9*  --   --   --   --   --   --   --  14.8  --   --   --   --   --   --   INR 1.95  --   --   --   --   --   --   --  1.17  --   --   --   --   --   --   HEPARINUNFRC 0.74*   < >  --    < >  --   --   --    < >  --  0.29* 0.24*  --   --  0.43  --   CREATININE 4.83*   < > 5.80*  --   --    < > 4.96*  --  4.12*  --   --   --  3.29*  --   --   CKTOTAL  --   --   --   --  14,417*  --   --   --   --   --   --   --   --   --   --   CKMB  --   --   --   --  135.4*  --   --   --   --   --   --   --   --   --   --    < > = values in this interval not displayed.    Estimated Creatinine Clearance: 21.3 mL/min (A) (by C-G formula based on SCr of 3.29 mg/dL (H)).   Medical History: Past Medical History:  Diagnosis Date  . ARF (acute renal failure) (HCC) 06/06/2018  . Hypertension   . RVF (right ventricular failure) (HCC) 06/06/2018  . Vitamin D deficiency     Assessment: 72yo male admitted on 10/20 after witnessed arrest w/ EMS then multiple arrests in ED. PMH includes known h/o PE/DVT, now off anticoagulation at least 1 year. Reported by wife to have c/o calf pain on  10/19 >> concern for PE and given TNKase in ED on 10/20. Patient found to have DVT. No chest CT done to r/o PE given AKI.  Pharmacy  has been consulted for heparin dosing. -heparin level at goal on 850 units/hr   Goal of Therapy:  Heparin level 0.3-0.5 units/ml Monitor platelets by anticoagulation protocol: Yes   Plan:  -No heparin changes needed -Daily heparin level and CBC  Harland German, PharmD Clinical Pharmacist Please check Amion for pharmacy contact number

## 2018-06-09 NOTE — Consult Note (Signed)
Consultation: Gross hematuria Requested by: Dr. Chase Caller  History of present illness: Bryan Parker is an unfortunate 72 year old male who suffered PEA arrest and EMS and critical care team were able to obtain ROSC, however patient still critical NICU with heparin drip and potential sepsis.  Urologically a Foley catheter was placed and bloody urine noted.  Patient presented with creatinine of 2.57 but it rose to 5.8. Pt met criteria for renal replacement therapy and CRRT was started. Urine output was noted to be down this AM and there was concern about the catheter and it was removed.  Urology consulted.    On review of care everywhere, patient has a history of BPH status post TURP in 2011, elevated PSA with prostate biopsy in 2012 with a PSA of 4.9 and 43 gram prostate. Recent PSA was 8.5 when he followed up with Dr. Ky Barban in [place] earlier this month.  DRE was normal and elevated PSA was thought to be from BPH.   Past Medical History:  Diagnosis Date  . ARF (acute renal failure) ([place]) 06/06/2018  . Hypertension   . RVF (right ventricular failure) ([place]) 06/06/2018  . Vitamin D deficiency    Past Surgical History:  Procedure Laterality Date  . PROSTATE SURGERY    . SPINE SURGERY      Home Medications:  Medications Prior to Admission  Medication Sig Dispense Refill Last Dose  . atorvastatin (LIPITOR) 20 MG tablet Take 20 mg by mouth daily.  1 06/04/2018 at am  . gabapentin (NEURONTIN) 300 MG capsule Take 300 mg by mouth 2 (two) times daily.  3 06/04/2018 at pm  . losartan (COZAAR) 100 MG tablet Take 100 mg by mouth daily.  1 06/04/2018 at am  . polyethylene glycol (MIRALAX / GLYCOLAX) packet Take 17 g by mouth every other day.    06/03/2018  . Vitamin D, Ergocalciferol, (DRISDOL) 50000 UNITS CAPS capsule Take 50,000 Units by mouth every Saturday.    06/04/2018 at Unknown time  . oxyCODONE-acetaminophen (PERCOCET/ROXICET) 5-325 MG per tablet Take 1-2 tablets by mouth every 3  (three) hours as needed for moderate pain. (Patient not taking: Reported on 06/07/2018) 15 tablet 0 Not Taking at Unknown time  . Rivaroxaban (XARELTO STARTER PACK) 15 & 20 MG TBPK Take as directed on package: Start with one 67m tablet by mouth twice a day with food. On Day 22, switch to one 253mtablet once a day with food. Take 15 mg 2 times daily through 11/09/14, then 20 mg daily starting 11/10/14. (Patient not taking: Reported on 05/17/2018) 51 each 0 Not Taking at Unknown time  . rivaroxaban (XARELTO) 20 MG TABS tablet Take 1 tablet (20 mg total) by mouth daily with supper. (Patient not taking: Reported on 05/17/2018) 30 tablet 2 Not Taking at Unknown time   Allergies: No Known Allergies  Family History  Problem Relation Age of Onset  . Deep vein thrombosis Sister   . Pulmonary embolism Sister   . Heart attack Brother 5044. Heart disease Brother    Social History:  reports that he has quit smoking. He does not have any smokeless tobacco history on file. He reports that he does not drink alcohol or use drugs.  ROS: A complete review of systems was performed.  All systems are negative except for pertinent findings as noted. Review of Systems  Unable to perform ROS: Critical illness   Physical Exam:  Vital signs in last 24 hours: Temp:  [95.5 F (35.3 C)-98.2 F (36.8  C)] 96.8 F (36 C) (10/24 1317) Pulse Rate:  [84-112] 112 (10/24 1200) Resp:  [12-36] 12 (10/24 1317) BP: (93-148)/(49-108) 93/58 (10/24 1200) SpO2:  [87 %-100 %] 99 % (10/24 1200) Arterial Line BP: (86-153)/(42-76) 117/61 (10/24 1317) FiO2 (%):  [40 %] 40 % (10/24 1134) Weight:  [86.5 kg] 86.5 kg (10/24 0500) General:  Alert and oriented, No acute distress HEENT: Normocephalic, atraumatic Cardiovascular: Regular rate and rhythm, tachy Lungs: Regular rate and effort -- intubated Abdomen: tense, anasarca Extremities: tense, anasarca Neurologic: Grossly intact  Procedure: He was prepped and draped in usual  sterile fashion and a 20 [place] hematuria catheter was placed without difficulty.  It did not irrigate well on traction and required advancement but irrigated normally.  I drained about 400 cc of urine.  It was bloody but no clots.  Catheter irrigated to light pink.  No significant active bleeding and no clots.  I put the catheter back on traction and it did not irrigate.  This may be from a TURP defect.  Catheter readvanced and irrigated and drained normally.  Given attention needed to keep catheter draining and no clots I decided not to start CBI.  Catheter almost all and I secured the drainage tubing to keep the catheter in good position and not on traction. Both nurses assisted and chaperoned.    Laboratory Data:  Results for orders placed or performed during the hospital encounter of 06/13/2018 (from the past 24 hour(s))  Glucose, capillary     Status: None   Collection Time: 06/08/18  3:51 PM  Result Value Ref Range   Glucose-Capillary 98 70 - 99 mg/dL   Comment 1 Capillary Specimen   Glucose, capillary     Status: Abnormal   Collection Time: 06/08/18  4:10 PM  Result Value Ref Range   Glucose-Capillary 106 (H) 70 - 99 mg/dL   Comment 1 Venous Specimen   Renal function panel (daily at 1600)     Status: Abnormal   Collection Time: 06/08/18  5:36 PM  Result Value Ref Range   Sodium 133 (L) 135 - 145 mmol/L   Potassium 6.0 (H) 3.5 - 5.1 mmol/L   Chloride 104 98 - 111 mmol/L   CO2 19 (L) 22 - 32 mmol/L   Glucose, Bld 123 (H) 70 - 99 mg/dL   BUN 44 (H) 8 - 23 mg/dL   Creatinine, Ser 4.96 (H) 0.61 - 1.24 mg/dL   Calcium 5.6 (LL) 8.9 - 10.3 mg/dL   Phosphorus 6.8 (H) 2.5 - 4.6 mg/dL   Albumin 1.5 (L) 3.5 - 5.0 g/dL   GFR calc non Af Amer 11 (L) >60 mL/min   GFR calc Af Amer 12 (L) >60 mL/min   Anion gap 10 5 - 15  Heparin level (unfractionated)     Status: Abnormal   Collection Time: 06/08/18  8:21 PM  Result Value Ref Range   Heparin Unfractionated 0.25 (L) 0.30 - 0.70 IU/mL   Glucose, capillary     Status: Abnormal   Collection Time: 06/08/18  8:24 PM  Result Value Ref Range   Glucose-Capillary 106 (H) 70 - 99 mg/dL   Comment 1 Arterial Specimen    Comment 2 Notify RN   Glucose, capillary     Status: Abnormal   Collection Time: 06/08/18 11:54 PM  Result Value Ref Range   Glucose-Capillary 115 (H) 70 - 99 mg/dL   Comment 1 Arterial Specimen    Comment 2 Notify RN   CBC  Status: Abnormal   Collection Time: 06/09/18  3:08 AM  Result Value Ref Range   WBC 21.9 (H) 4.0 - 10.5 K/uL   RBC 2.48 (L) 4.22 - 5.81 MIL/uL   Hemoglobin 7.0 (L) 13.0 - 17.0 g/dL   HCT 19.9 (L) 39.0 - 52.0 %   MCV 80.2 80.0 - 100.0 fL   MCH 28.2 26.0 - 34.0 pg   MCHC 35.2 30.0 - 36.0 g/dL   RDW 16.5 (H) 11.5 - 15.5 %   Platelets 54 (L) 150 - 400 K/uL   nRBC 0.0 0.0 - 0.2 %  Magnesium     Status: None   Collection Time: 06/09/18  3:08 AM  Result Value Ref Range   Magnesium 2.2 1.7 - 2.4 mg/dL  Phosphorus     Status: Abnormal   Collection Time: 06/09/18  3:08 AM  Result Value Ref Range   Phosphorus 5.5 (H) 2.5 - 4.6 mg/dL  Renal function panel (daily at 0500)     Status: Abnormal   Collection Time: 06/09/18  3:08 AM  Result Value Ref Range   Sodium 132 (L) 135 - 145 mmol/L   Potassium 5.5 (H) 3.5 - 5.1 mmol/L   Chloride 105 98 - 111 mmol/L   CO2 20 (L) 22 - 32 mmol/L   Glucose, Bld 116 (H) 70 - 99 mg/dL   BUN 35 (H) 8 - 23 mg/dL   Creatinine, Ser 4.12 (H) 0.61 - 1.24 mg/dL   Calcium 5.7 (LL) 8.9 - 10.3 mg/dL   Phosphorus 5.6 (H) 2.5 - 4.6 mg/dL   Albumin 1.5 (L) 3.5 - 5.0 g/dL   GFR calc non Af Amer 13 (L) >60 mL/min   GFR calc Af Amer 15 (L) >60 mL/min   Anion gap 7 5 - 15  Protime-INR     Status: None   Collection Time: 06/09/18  3:08 AM  Result Value Ref Range   Prothrombin Time 14.8 11.4 - 15.2 seconds   INR 1.17   Heparin level (unfractionated)     Status: Abnormal   Collection Time: 06/09/18  3:11 AM  Result Value Ref Range   Heparin Unfractionated 0.29  (L) 0.30 - 0.70 IU/mL  Type and screen [place]     Status: None (Preliminary result)   Collection Time: 06/09/18  3:52 AM  Result Value Ref Range   ABO/RH(D) A POS    Antibody Screen NEG    Sample Expiration 06/12/2018    Unit Number [id number]    Blood Component Type RED CELLS,LR    Unit division 00    Status of Unit ISSUED    Transfusion Status OK TO TRANSFUSE    Crossmatch Result Compatible    Unit Number [id number]    Blood Component Type RED CELLS,LR    Unit division 00    Status of Unit ISSUED    Transfusion Status OK TO TRANSFUSE    Crossmatch Result      Compatible Performed at [facility], [address] [place], [place] [address]   Prepare RBC     Status: None   Collection Time: 06/09/18  3:52 AM  Result Value Ref Range   Order Confirmation      ORDER PROCESSED BY BLOOD BANK Performed at [facility], [place] [address] [place], [place] [address]   I-STAT 3, arterial blood gas (G3+)     Status: Abnormal   Collection Time: 06/09/18  4:52 AM  Result Value Ref Range  pH, Arterial 7.487 (H) 7.350 - 7.450   pCO2 arterial 29.2 (L) 32.0 - 48.0 mmHg   pO2, Arterial 121.0 (H) 83.0 - 108.0 mmHg   Bicarbonate 22.4 20.0 - 28.0 mmol/L   TCO2 23 22 - 32 mmol/L   O2 Saturation 99.0 %   Acid-base deficit 1.0 0.0 - 2.0 mmol/L   Patient temperature 95.7 F    Collection site ARTERIAL LINE    Sample type ARTERIAL   Glucose, capillary     Status: None   Collection Time: 06/09/18  7:58 AM  Result Value Ref Range   Glucose-Capillary 92 70 - 99 mg/dL   Comment 1 Notify RN   .Cooxemetry Panel (carboxy, met, total hgb, O2 sat)     Status: Abnormal   Collection Time: 06/09/18  8:00 AM  Result Value Ref Range   Total hemoglobin 7.8 (L) 12.0 - 16.0 g/dL   O2 Saturation 67.0 %   Carboxyhemoglobin 1.4 0.5 - 1.5 %   Methemoglobin 2.5 (H) 0.0 - 1.5 %  Hemoglobin and hematocrit, blood     Status: Abnormal   Collection Time: 06/09/18  9:21 AM   Result Value Ref Range   Hemoglobin 7.6 (L) 13.0 - 17.0 g/dL   HCT 21.5 (L) 39.0 - 52.0 %  Heparin level (unfractionated)     Status: Abnormal   Collection Time: 06/09/18  9:21 AM  Result Value Ref Range   Heparin Unfractionated 0.24 (L) 0.30 - 0.70 IU/mL  Prepare RBC     Status: None   Collection Time: 06/09/18 11:26 AM  Result Value Ref Range   Order Confirmation      ORDER PROCESSED BY BLOOD BANK BB SAMPLE OR UNITS ALREADY AVAILABLE Performed at [facility], [address], [place], [place] [address]   Glucose, capillary     Status: Abnormal   Collection Time: 06/09/18 11:47 AM  Result Value Ref Range   Glucose-Capillary 132 (H) 70 - 99 mg/dL  I-STAT 3, arterial blood gas (G3+)     Status: Abnormal   Collection Time: 06/09/18  1:03 PM  Result Value Ref Range   pH, Arterial 7.495 (H) 7.350 - 7.450   pCO2 arterial 29.9 (L) 32.0 - 48.0 mmHg   pO2, Arterial 110.0 (H) 83.0 - 108.0 mmHg   Bicarbonate 23.1 20.0 - 28.0 mmol/L   TCO2 24 22 - 32 mmol/L   O2 Saturation 99.0 %   Patient temperature HIDE    Sample type ARTERIAL    Recent Results (from the past 240 hour(s))  MRSA PCR Screening     Status: None   Collection Time: 06/14/2018  8:21 AM  Result Value Ref Range Status   MRSA by PCR NEGATIVE NEGATIVE Final    Comment:        The GeneXpert MRSA Assay (FDA approved for NASAL specimens only), is one component of a comprehensive MRSA colonization surveillance program. It is not intended to diagnose MRSA infection nor to guide or monitor treatment for MRSA infections. Performed at [facility], [place] [address], [place], [place] [address]   Culture, blood (routine x 2)     Status: None (Preliminary result)   Collection Time: 05/25/2018  9:00 AM  Result Value Ref Range Status   Specimen Description BLOOD RIGHT CENTRAL LINE  Final   Special Requests   Final    BOTTLES DRAWN AEROBIC AND ANAEROBIC Blood Culture adequate volume   Culture   Final    NO GROWTH 4  DAYS  Performed at [facility], [place] [address], [place], [place] [address]    Report Status PENDING  Incomplete  Culture, blood (routine x 2)     Status: None (Preliminary result)   Collection Time: 05/17/2018  9:00 AM  Result Value Ref Range Status   Specimen Description BLOOD LEFT A-LINE  Final   Special Requests   Final    BOTTLES DRAWN AEROBIC AND ANAEROBIC Blood Culture adequate volume   Culture   Final    NO GROWTH 4 DAYS Performed at [facility], [address] [place], [place] [address]    Report Status PENDING  Incomplete  Culture, respiratory (non-expectorated)     Status: None   Collection Time: 05/20/2018 11:30 AM  Result Value Ref Range Status   Specimen Description TRACHEAL ASPIRATE  Final   Special Requests NONE  Final   Gram Stain   Final    RARE WBC PRESENT, PREDOMINANTLY PMN MODERATE GRAM POSITIVE COCCI IN PAIRS IN CHAINS RARE GRAM VARIABLE ROD    Culture   Final    MODERATE Consistent with normal respiratory flora. Performed at [facility], [place] [address], [place], [place] [address]    Report Status 06/07/2018 FINAL  Final   Creatinine: Recent Labs    06/06/18 2106 06/07/18 0408 06/07/18 1234 06/08/18 0441 06/08/18 1121 06/08/18 1736 06/09/18 0308  CREATININE 4.53* 4.83* 5.23* 5.80* 5.31* 4.96* 4.12*    Impression/Assessment/plan: Gross hematuria-likely from BPH. Discussed with nurses to inform care team if pt is able to go for chest CT, please also scan abdomen and pelvis. Difficult situation as patient needs anticoagulation given his clinical presentation and history. Continue supportive care.  The catheter as needed.  Some of the blood per meatus was likely overflow incontinence and inability of the catheters to drain may be from a prior TURP defect with the catheter is getting down into the prostatic urethra. If he survives, he would eventually need CT scan and cystoscopy.  Bedside cystoscopy might have some diagnostic potential,  but no therapeutic options and wouldn't change management right now.  Will follow.   Festus Aloe 06/09/2018, 1:38 PM

## 2018-06-10 ENCOUNTER — Inpatient Hospital Stay (HOSPITAL_COMMUNITY): Payer: Medicare Other

## 2018-06-10 DIAGNOSIS — R609 Edema, unspecified: Secondary | ICD-10-CM

## 2018-06-10 LAB — RENAL FUNCTION PANEL
Albumin: 1.5 g/dL — ABNORMAL LOW (ref 3.5–5.0)
Albumin: 1.6 g/dL — ABNORMAL LOW (ref 3.5–5.0)
Anion gap: 8 (ref 5–15)
Anion gap: 5 (ref 5–15)
BUN: 22 mg/dL (ref 8–23)
BUN: 17 mg/dL (ref 8–23)
CHLORIDE: 104 mmol/L (ref 98–111)
CHLORIDE: 105 mmol/L (ref 98–111)
CO2: 24 mmol/L (ref 22–32)
CO2: 25 mmol/L (ref 22–32)
CREATININE: 2.35 mg/dL — AB (ref 0.61–1.24)
Calcium: 6.3 mg/dL — CL (ref 8.9–10.3)
Calcium: 6.5 mg/dL — ABNORMAL LOW (ref 8.9–10.3)
Creatinine, Ser: 2.9 mg/dL — ABNORMAL HIGH (ref 0.61–1.24)
GFR calc Af Amer: 24 mL/min — ABNORMAL LOW (ref >60)
GFR calc non Af Amer: 20 mL/min — ABNORMAL LOW (ref >60)
GFR calc non Af Amer: 26 mL/min — ABNORMAL LOW (ref >60)
GFR, EST AFRICAN AMERICAN: 30 mL/min — AB (ref >60)
GLUCOSE: 150 mg/dL — AB (ref 70–99)
Glucose, Bld: 143 mg/dL — ABNORMAL HIGH (ref 70–99)
POTASSIUM: 4.1 mmol/L (ref 3.5–5.1)
Phosphorus: 4.7 mg/dL — ABNORMAL HIGH (ref 2.5–4.6)
Phosphorus: 3.9 mg/dL (ref 2.5–4.6)
Potassium: 4.5 mmol/L (ref 3.5–5.1)
SODIUM: 135 mmol/L (ref 135–145)
Sodium: 136 mmol/L (ref 135–145)

## 2018-06-10 LAB — POCT I-STAT 3, ART BLOOD GAS (G3+)
Acid-Base Excess: 1 mmol/L (ref 0.0–2.0)
BICARBONATE: 25.6 mmol/L (ref 20.0–28.0)
O2 SAT: 98 %
PCO2 ART: 42.1 mmHg (ref 32.0–48.0)
PO2 ART: 110 mmHg — AB (ref 83.0–108.0)
Patient temperature: 37.3
TCO2: 27 mmol/L (ref 22–32)
pH, Arterial: 7.393 (ref 7.350–7.450)

## 2018-06-10 LAB — CK TOTAL AND CKMB (NOT AT ARMC)
CK, MB: 278.9 ng/mL — ABNORMAL HIGH (ref 0.5–5.0)
RELATIVE INDEX: 0.8 (ref 0.0–2.5)
Total CK: 34857 U/L — ABNORMAL HIGH (ref 49–397)

## 2018-06-10 LAB — CULTURE, BLOOD (ROUTINE X 2)
CULTURE: NO GROWTH
Culture: NO GROWTH
Special Requests: ADEQUATE
Special Requests: ADEQUATE

## 2018-06-10 LAB — GLUCOSE, CAPILLARY
GLUCOSE-CAPILLARY: 145 mg/dL — AB (ref 70–99)
GLUCOSE-CAPILLARY: 146 mg/dL — AB (ref 70–99)
GLUCOSE-CAPILLARY: 151 mg/dL — AB (ref 70–99)
GLUCOSE-CAPILLARY: 135 mg/dL — AB (ref 70–99)
Glucose-Capillary: 155 mg/dL — ABNORMAL HIGH (ref 70–99)
Glucose-Capillary: 138 mg/dL — ABNORMAL HIGH (ref 70–99)

## 2018-06-10 LAB — HEPARIN LEVEL (UNFRACTIONATED)
HEPARIN UNFRACTIONATED: 0.28 [IU]/mL — AB (ref 0.30–0.70)
Heparin Unfractionated: 0.29 IU/mL — ABNORMAL LOW (ref 0.30–0.70)
Heparin Unfractionated: 0.31 IU/mL (ref 0.30–0.70)

## 2018-06-10 LAB — CBC
HEMATOCRIT: 22.5 % — AB (ref 39.0–52.0)
Hemoglobin: 7.6 g/dL — ABNORMAL LOW (ref 13.0–17.0)
MCH: 28.7 pg (ref 26.0–34.0)
MCHC: 33.8 g/dL (ref 30.0–36.0)
MCV: 84.9 fL (ref 80.0–100.0)
Platelets: 59 10*3/uL — ABNORMAL LOW (ref 150–400)
RBC: 2.65 MIL/uL — ABNORMAL LOW (ref 4.22–5.81)
RDW: 15.8 % — AB (ref 11.5–15.5)
WBC: 15.1 10*3/uL — AB (ref 4.0–10.5)
nRBC: 0.1 % (ref 0.0–0.2)

## 2018-06-10 LAB — MAGNESIUM: Magnesium: 2.3 mg/dL (ref 1.7–2.4)

## 2018-06-10 LAB — VANCOMYCIN, RANDOM: Vancomycin Rm: 36

## 2018-06-10 LAB — COOXEMETRY PANEL
CARBOXYHEMOGLOBIN: 1.4 % (ref 0.5–1.5)
METHEMOGLOBIN: 2.4 % — AB (ref 0.0–1.5)
O2 Saturation: 65.5 %
Total hemoglobin: 10.5 g/dL — ABNORMAL LOW (ref 12.0–16.0)

## 2018-06-10 LAB — PREPARE RBC (CROSSMATCH)

## 2018-06-10 MED ORDER — METOCLOPRAMIDE HCL 5 MG/ML IJ SOLN
5.0000 mg | Freq: Four times a day (QID) | INTRAMUSCULAR | Status: DC
  Administered 2018-06-10 – 2018-06-24 (×57): 5 mg via INTRAVENOUS
  Filled 2018-06-10 (×58): qty 2

## 2018-06-10 MED ORDER — SODIUM CHLORIDE 0.9% IV SOLUTION
Freq: Once | INTRAVENOUS | Status: AC
  Administered 2018-06-10: 10 mL via INTRAVENOUS

## 2018-06-10 MED ORDER — CALCIUM GLUCONATE-NACL 1-0.675 GM/50ML-% IV SOLN
1.0000 g | Freq: Once | INTRAVENOUS | Status: AC
  Administered 2018-06-10: 1000 mg via INTRAVENOUS
  Filled 2018-06-10: qty 50

## 2018-06-10 MED ORDER — PRISMASOL BGK 4/2.5 32-4-2.5 MEQ/L IV SOLN
INTRAVENOUS | Status: DC
  Administered 2018-06-10: 20:00:00 via INTRAVENOUS_CENTRAL
  Administered 2018-06-10 (×2): 1 via INTRAVENOUS_CENTRAL
  Administered 2018-06-10: 23:00:00 via INTRAVENOUS_CENTRAL
  Administered 2018-06-10 – 2018-06-11 (×2): 1 via INTRAVENOUS_CENTRAL
  Administered 2018-06-11: 10:00:00 via INTRAVENOUS_CENTRAL
  Administered 2018-06-11: 1 via INTRAVENOUS_CENTRAL
  Administered 2018-06-11 – 2018-06-12 (×10): via INTRAVENOUS_CENTRAL
  Administered 2018-06-12: 1 via INTRAVENOUS_CENTRAL
  Administered 2018-06-13 – 2018-06-18 (×34): via INTRAVENOUS_CENTRAL
  Filled 2018-06-10 (×81): qty 5000

## 2018-06-10 MED ORDER — LACTATED RINGERS IV BOLUS
2000.0000 mL | Freq: Once | INTRAVENOUS | Status: AC
  Administered 2018-06-10: 2000 mL via INTRAVENOUS

## 2018-06-10 MED ORDER — VANCOMYCIN VARIABLE DOSE PER UNSTABLE RENAL FUNCTION (PHARMACIST DOSING): Status: DC

## 2018-06-10 MED ORDER — SODIUM CHLORIDE 0.9 % IV SOLN
1.2500 ng/kg/min | INTRAVENOUS | Status: DC
  Filled 2018-06-10: qty 1

## 2018-06-10 NOTE — Progress Notes (Addendum)
Preliminary notes--Bilateral lower extremities venous duplex exam completed. Positive for acute DVT involving left popliteal vein. Limited study due to patient critical condition and limited access.  Incidental finding: Complex fluid collection noticed at right popliteal fossa medially, measuring 2.78x2.36x3.43cm.   Result notified RN.  Hongying Duilio Heritage (RDMS RVT) 06/10/18 3:40 PM

## 2018-06-10 NOTE — Progress Notes (Signed)
Pharmacy Antibiotic Note  Bryan Parker is a 72 y.o. male admitted on 06/08/2018 with possible aspiration pna/sepsis. Continues on CRRT. Tmax 99, WBC down to 15, lactate 2.1, PCT >150. Normal flora in TA, no growth in other cultures.  Vancomycin random level obtained to assess safety and efficacy of therapy, returned supratherapeutic at 36 mcg/mL. Level was drawn after vancomycin was administered, artificially high.     Plan: Continue Zosyn 2.25 g q6h for now Vancomycin order discontinued per CCM  Height: 5\' 10"  (177.8 cm) Weight: 180 lb 5.4 oz (81.8 kg) IBW/kg (Calculated) : 73  Temp (24hrs), Avg:97.3 F (36.3 C), Min:95.9 F (35.5 C), Max:99.1 F (37.3 C)  Recent Labs  Lab 06/01/2018 1929  05/29/2018 2229  06/06/18 0414  06/07/18 1058  06/08/18 0023 06/08/18 0441 06/08/18 1121 06/08/18 1130 06/08/18 1736 06/09/18 0308 06/09/18 1600 06/09/18 2125 06/10/18 0355 06/10/18 1050  WBC  --   --   --    < >  --    < >  --    < > 26.8* 26.0*  --   --   --  21.9*  --  15.6* 15.1*  --   CREATININE  --    < >  --    < >  --    < >  --    < >  --  5.80* 5.31*  --  4.96* 4.12* 3.29*  --  2.90*  --   LATICACIDVEN 10.0*  --  9.9*  --  6.5*  --  2.8*  --   --   --  2.1*  --   --   --   --   --   --   --   VANCORANDOM  --   --   --   --   --   --   --   --   --   --   --  22  --   --   --   --   --  36   < > = values in this interval not displayed.    Estimated Creatinine Clearance: 24.1 mL/min (A) (by C-G formula based on SCr of 2.9 mg/dL (H)).    No Known Allergies  Dose adjustments this admission: 10/23 VR = 22 - vanc 750 mg q24h, pt started on CRRT (zosyn adjusted for CRRT)  Antimicrobials:  Zosyn 10/20>>  Vancomycin 10/20 >>  Cultures: 10/20 BCx: NGTD 10/20 TA: normal flora 10/20 MRSA PCR: negative    Thank you for involving pharmacy in this patient's care.   Marcelino Freestone, PharmD PGY2 Cardiology Pharmacy Resident Phone [phone] Please check AMION for all  Pharmacist numbers by unit 06/10/2018 1:17 PM

## 2018-06-10 NOTE — Progress Notes (Signed)
CRITICAL VALUE ALERT  Critical Value:  Ca 6.3  Date & Time Notied:  06/10/18 @ 0512  Provider Notified: eLink notified  Orders Received/Actions taken: Awaiting orders from MD.  Herma Ard, RN

## 2018-06-10 NOTE — Progress Notes (Addendum)
Patient ID: Bryan Parker, male   DOB: 03-20-1946, 72 y.o.   MRN: [id number]     Advanced Heart Failure Rounding Note  PCP-Cardiologist: Bryan Him, MD   Subjective:    CK 14,417 > 34,857 today.   Urology consulted for urethral bleeding. Supportive care for now, but recommended further imaging if possible and ?TURP in future. Hemoglobin 7.6. Remains on heparin. No blood ordered yet today.   Remains on CRRT. Pulling 50-100 mls/hr. -6.4 L off yesterday. 380 mls UOP charted yesterday. CVP 11. K 4.1, Mag 2.3, Ca 6.3. Weight down 10 lbs.   Remains on vaso 0.03, norepi 23, epi 10, dobut 20, giapreza 25. MAPs 60. Coox 65%  WBC 25.6 > 26 > 22 > 15. Tmax 99.0, but 96 this morning. PCT >150 10/22. Remains on Vanc and Zosyn. Sputum + mod gram positive cocci, cx shows normal flora. Blood cultures NGTD.  Remains intubated and sedated. FiO2 40%. On iNO 30 ppm.  Not following commands per RN, but awakens easily and withdraws.   ABG: pH 7.39, pCO2 27, pO2 110, Bicarb 26  Objective:   Weight Range: 81.8 kg Body mass index is 25.88 kg/m.   Vital Signs:   Temp:  [95.9 F (35.5 C)-99.1 F (37.3 C)] 96.8 F (36 C) (10/25 0743) Pulse Rate:  [72-112] 78 (10/25 0743) Resp:  [12-36] 16 (10/25 0743) BP: (92-120)/(43-76) 107/43 (10/25 0743) SpO2:  [96 %-100 %] 100 % (10/25 0743) Arterial Line BP: (86-134)/(42-63) 106/43 (10/25 0700) FiO2 (%):  [40 %] 40 % (10/25 0743) Weight:  [81.8 kg] 81.8 kg (10/25 0500) Last BM Date: 06/09/18  Weight change: Filed Weights   06/08/18 0615 06/09/18 0500 06/10/18 0500  Weight: 88.2 kg 86.5 kg 81.8 kg    Intake/Output:   Intake/Output Summary (Last 24 hours) at 06/10/2018 0810 Last data filed at 06/10/2018 0800 Gross per 24 hour  Intake 5594.84 ml  Output 7090 ml  Net -1495.16 ml      Physical Exam    General: Intubated/sedated. Easily awakens HEENT: + ETT Neck: Supple. JVP 10-11. Carotids 2+ bilat; no bruits. No thyromegaly or nodule  noted. Cor: PMI nondisplaced. RRR, No M/G/R noted Lungs: Diminished basilar sounds Abdomen: Soft, non-tender, non-distended, no HSM. No bruits or masses. +BS  Extremities: No cyanosis, clubbing, or rash. Anasarca Neuro: Intubated/sedated  Telemetry   NSR 70-90s Personally reviewed.   EKG    No new tracings.   Labs    CBC Recent Labs    06/08/18 0441  06/09/18 2125 06/10/18 0355  WBC 26.0*   < > 15.6* 15.1*  NEUTROABS 16.1*  --   --   --   HGB 8.0*   < > 7.8* 7.6*  HCT 22.7*   < > 22.7* 22.5*  MCV 81.4   < > 83.8 84.9  PLT 56*   < > 50* 59*   < > = values in this interval not displayed.   Basic Metabolic Panel Recent Labs    06/09/18 0308 06/09/18 1600 06/10/18 0355  NA 132* 134* 136  K 5.5* 4.8 4.1  CL 105 105 104  CO2 20* 21* 24  GLUCOSE 116* 135* 150*  BUN 35* 29* 22  CREATININE 4.12* 3.29* 2.90*  CALCIUM 5.7* 5.7* 6.3*  MG 2.2  --  2.3  PHOS 5.5*  5.6* 5.7* 4.7*   Liver Function Tests Recent Labs    06/07/18 0812 06/08/18 0441  06/09/18 1600 06/10/18 0355  AST 271* 279*  --   --   --  ALT 166* 145*  --   --   --   ALKPHOS 45 48  --   --   --   BILITOT 2.0* 2.3*  --   --   --   PROT <3.0* 3.3*  --   --   --   ALBUMIN 1.4* 1.4*   < > 1.5* 1.5*   < > = values in this interval not displayed.   Recent Labs    06/08/18 1114  LIPASE 23   Cardiac Enzymes Recent Labs    06/08/18 1114 06/10/18 0355  CKTOTAL 14,417* 34,857*  CKMB 135.4* 278.9*    BNP: BNP (last 3 results) Recent Labs    06/03/2018 0854  BNP 18.0    ProBNP (last 3 results) No results for input(s): PROBNP in the last 8760 hours.   D-Dimer No results for input(s): DDIMER in the last 72 hours. Hemoglobin A1C No results for input(s): HGBA1C in the last 72 hours. Fasting Lipid Panel No results for input(s): CHOL, HDL, LDLCALC, TRIG, CHOLHDL, LDLDIRECT in the last 72 hours. Thyroid Function Tests No results for input(s): TSH, T4TOTAL, T3FREE, THYROIDAB in the last 72  hours.  Invalid input(s): FREET3  Other results:   Imaging    No results found.   Medications:     Scheduled Medications: . chlorhexidine gluconate (MEDLINE KIT)  15 mL Mouth Rinse BID  . Chlorhexidine Gluconate Cloth  6 each Topical Q0600  . lidocaine  1 application Urethral Once  . lidocaine  1 application Urethral Once  . mouth rinse  15 mL Mouth Rinse 10 times per day  . sodium chloride flush  10-40 mL Intracatheter Q12H  . THROMBI-PAD  1 each Topical Once    Infusions: . sodium chloride Stopped (06/08/18 0557)  . sodium chloride 10 mL/hr at 06/10/18 0800  . angiotensin II (GIAPREZA) infusion 25 ng/kg/min (06/10/18 0800)  . DOBUTamine 20 mcg/kg/min (06/10/18 0800)  . epinephrine 10 mcg/min (06/10/18 0800)  . famotidine (PEPCID) IV Stopped (06/09/18 1046)  . feeding supplement (VITAL AF 1.2 CAL) 55 mL/hr at 06/10/18 0700  . fentaNYL infusion INTRAVENOUS 200 mcg/hr (06/10/18 0800)  . heparin 850 Units/hr (06/10/18 0800)  . midazolam (VERSED) infusion 10 mg/hr (06/10/18 0800)  . norepinephrine (LEVOPHED) Adult infusion 20 mcg/min (06/10/18 0800)  . piperacillin-tazobactam (ZOSYN)  IV Stopped (06/10/18 0639)  . dialysis replacement fluid (prismasate) 300 mL/hr at 06/09/18 2324  . dialysis replacement fluid (prismasate) 350 mL/hr at 06/09/18 1910  . prismasol BGK 4/2.5    . sodium chloride    . sodium chloride    . vancomycin 1,000 mg (06/10/18 0804)  . vasopressin (PITRESSIN) infusion - *FOR SHOCK* 0.03 Units/min (06/10/18 0800)    PRN Medications: sodium chloride, fentaNYL (SUBLIMAZE) injection, heparin, midazolam, sodium chloride, sodium chloride flush    Patient Profile   Bryan Parker is a 72 y.o. 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  Presented to [facility] after PEA arrest with 10+ minutes down, presumed large PE. Now with RV failure.  Assessment/Plan   1. Shock: Think this has a mixed  origin.  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 Suspect also component of septic shock with very high PCT, fever, and presumed PNA.  Echo 10/2014 showed EF 60%, RV normal, PA peak pressure 73 mmHg.  Echo 06/03/2018 showed LV EF 55-60%, RV severely dilated and dysfunctional, moderate TR, moderately dilated RA, PA peak pressure 31 mmHg.    Now requiring multiple pressors/inotropes, MAP improved with use of Giapreza.  CVP 11 - On CVVH, with stable BP can increase UF to 75-100 cc/hr.   - On dobutamine 20, NE 20, epi 10, vaso 0.03, Giapreza 25.  - Continue iNO 30 ppm for now.  - Did not place RV impella with extensive bleeding post-TNKase and extensive clot.  - Coox 66%. 2. PEA arrest: Suspect secondary to massive PE.  Received 10+ minutes CPR. Head CT negative. Seems to be neurologically intact. No change. 3. Presumed massive PE: Has hx of PE in 2016, but has not been on chronic anticoagulation (off >1 year).  Lower extremity dopplers this admission positive for left popliteal acute DVT.  Received TNKase in ED.  - Continue heparin drip.  4. ID: WBCs trending down, 15.0  PCT was > 150.  Concerned for HCAP, possibly in setting of aspiration.  Cultures remain negative.  - On Zosyn and Vanc. Tmax 99 overnight 5. AKI on CKD: Baseline creatinine looks to be 1.6-2.2 range (cause of baseline CKD uncertain). He is now on CVVH with rising creatinine, poor UOP, and volume overload. Renal US negative for hydronephrosis.  AKI due to shock.  - Continue CVVH, can increase UF to 75-100 cc/hr.  6. Anemia: Bleeding from OGT and urethra post-TNKase.  OGT bleeding has stopped but still with urethral bleeding.  He has received multiple units PRBCs, hgb 7.6 today - Urology consulted yesterday. Supportive care for now, but recommended further imaging if possible and ?TURP in future.  7. Elevated liver enzymes: In setting of shock (ischemic hepatitis). Trend. No change. 8. Hypocalcemia: per nephro. 9. Acute  respiratory hypoxemic respiratory failure: Remains intubated, suspect due to combination of PNA and pulmonary edema.  - Remains intubated. FiO2 40%. No change. 10. Thrombocytopenia: Plts stable at 59 today.  Suspect fall in plts in setting of sepsis, critical illness/inflammation.  11. Rhabdomyolysis - CK 14,417 > 34,857 - Per primary. Continue CRRT  Length of Stay: [place], NP  06/10/2018, 8:10 AM  Advanced Heart Failure Team Pager [phone] (M-F; 7a - 4p)  Please contact [facility] for night-coverage after hours (4p -7a ) and weekends on [email or web address]  Patient seen with NP, agree with the above note.    On exam, 2+ edema to thighs.  JVP 14 cm.  Decreased BS dependently.   CK is markedly elevated, rhabdomyolysis (?cause, ?global hypoperfusion) may be cause of AKI.  He is not making urine at this point.  I do not think that giving volume aggressively + CVVH is going to help, pushing volume + Lasix is helpful with rhabdomyolysis prior to severe AKI.  At this point, he is on CVVH and we will not be helping Parker.  Would stop pushing volume.    He remains anuric on CVVH.  Pulling fluid at 100 cc/hr UF currently.  He is markedly volume overloaded with anasarca, continue pulling.   MAP in 60s at this time.  Suspect combination RV failure/cardiogenic shock and septic shock.  - Would wean off Giapreza first.  - Keep dobutamine at 10 (would not decrease further, using to support RV).   Hgb 7.5 this morning, got 1 unit PRBCs.  He continue on heparin gtt, blood still in foley bag.   He remains on Zosyn empirically.   CRITICAL CARE Performed by: Loralie Champagne  Total critical care time: 35 minutes  Critical care time was exclusive of separately billable procedures and treating other patients.  Critical care was necessary  to treat or prevent imminent or life-threatening deterioration.  Critical care was time spent personally by me on the following activities: development of  treatment plan with patient and/or surrogate as well as nursing, discussions with consultants, evaluation of patient's response to treatment, examination of patient, obtaining history from patient or surrogate, ordering and performing treatments and interventions, ordering and review of laboratory studies, ordering and review of radiographic studies, pulse oximetry and re-evaluation of patient's condition.  Loralie Champagne 06/10/2018 4:15 PM

## 2018-06-10 NOTE — Progress Notes (Signed)
Subjective:  Remains hemodynamically unstable overnight , CRRT running well - Urology placed Coude cath and is doing CBI- 380 of urine was recorded   Objective Vital signs in last 24 hours: Vitals:   06/10/18 0400 06/10/18 0500 06/10/18 0600 06/10/18 0700  BP: 106/60 (!) 103/51 (!) 102/57 (!) 100/49  Pulse: 95 91 76 72  Resp: 15 15 14 12   Temp: 99 F (37.2 C) 98.6 F (37 C) (!) 96.6 F (35.9 C) (!) 96.8 F (36 C)  TempSrc: Esophageal     SpO2: 99% 100% 100% 100%  Weight:  81.8 kg    Height:  5' 10"  (1.778 m)     Weight change: -4.7 kg  Intake/Output Summary (Last 24 hours) at 06/10/2018 0739 Last data filed at 06/10/2018 0700 Gross per 24 hour  Intake 5550.74 ml  Output 7424 ml  Net -1873.26 ml    Assessment/Plan: 72 year old BM with CKD- (1.5 to 2.0 in 2016, upon presentation was 2.5) and history of PE who presented with PEA arrest, presumed PE with hemodynamic instability and A on CRF  1.Renal-acute on chronic kidney disease.  Not sure of the etiology of his chronic kidney disease.  However, his acute is most likely secondary to his multisystem organ failure and prolonged hypotension requiring pressors.  He is oliguric  now requiring  renal replacement therapy.  CRRT started 10/23.  Running well, no heparin, zero K dialysate but other bags 4 K- now K is normal will change all to 4 k bags 2. Hypertension/volume  -appears massively volume overloaded, receiving lots of drips and therefore lots of volume.  CVP has been near 20.  tolerating neg 50 per hour, cont for now, assess later for ability to pull more- able to get 1.6 liters neg yest 3.  Hyperkalemia-resolved, change to all 4 k bags 4. Anemia-situational and multifactorial.  Has been transfused this admission-supportive care, per CCM- transfuse as needed 5.  Metabolic acidosis- improved 6.  Hemodynamic instability-multiple pressors with inability to wean.  Cardiogenic versus septic shock per cardiology and CCM- some  progress 7. Gross hematuria-  Renal u/s normal (no hydro) on 10/20, - urology placed cath- achieving good drainage for now    [place]: Basic Metabolic Panel: Recent Labs  Lab 06/09/18 0308 06/09/18 1600 06/10/18 0355  NA 132* 134* 136  K 5.5* 4.8 4.1  CL 105 105 104  CO2 20* 21* 24  GLUCOSE 116* 135* 150*  BUN 35* 29* 22  CREATININE 4.12* 3.29* 2.90*  CALCIUM 5.7* 5.7* 6.3*  PHOS 5.5*  5.6* 5.7* 4.7*   Liver Function Tests: Recent Labs  Lab 06/06/18 0807 06/07/18 0812 06/08/18 0441  06/09/18 0308 06/09/18 1600 06/10/18 0355  AST 485* 271* 279*  --   --   --   --   ALT 266* 166* 145*  --   --   --   --   ALKPHOS 62 45 48  --   --   --   --   BILITOT 1.0 2.0* 2.3*  --   --   --   --   PROT <3.0* <3.0* 3.3*  --   --   --   --   ALBUMIN 1.5* 1.4* 1.4*   < > 1.5* 1.5* 1.5*   < > = values in this interval not displayed.   Recent Labs  Lab 06/08/18 1114  LIPASE 23   No results for input(s): AMMONIA in the last 168 hours.  CBC: Recent Labs  Lab 05/25/2018 0439  06/08/18 0023 06/08/18 0441 06/09/18 0308  06/09/18 1548 06/09/18 2125 06/10/18 0355  WBC 8.9   < > 26.8* 26.0* 21.9*  --   --  15.6* 15.1*  NEUTROABS 2.6  --   --  16.1*  --   --   --   --   --   HGB 11.6*   < > 6.8* 8.0* 7.0*   < > 8.1* 7.8* 7.6*  HCT 39.6   < > 18.8* 22.7* 19.9*   < > 24.0* 22.7* 22.5*  MCV 99.7   < > 81.7 81.4 80.2  --   --  83.8 84.9  PLT 201   < > 56* 56* 54*  --   --  50* 59*   < > = values in this interval not displayed.   Cardiac Enzymes: Recent Labs  Lab 05/29/2018 0854 05/27/2018 1935 06/06/18 0124 06/06/18 0807 06/08/18 1114 06/10/18 0355  CKTOTAL  --   --   --   --  14,417* 34,857*  CKMB  --   --   --   --  135.4* 278.9*  TROPONINI 1.98* 24.95* 20.53* 11.98*  --   --    CBG: Recent Labs  Lab 06/09/18 1147 06/09/18 2006 06/09/18 2328 06/10/18 0335 06/10/18 0356  GLUCAP 132* 125* 127* 155* 145*    Iron Studies: No results for input(s):  IRON, TIBC, TRANSFERRIN, FERRITIN in the last 72 hours. Studies/Results: Dg Chest Port 1 View  Result Date: 06/09/2018 CLINICAL DATA:  Evaluate endotracheal tube, respiratory failure EXAM: PORTABLE CHEST 1 VIEW COMPARISON:  Portable chest x-ray of 06/08/2018 and 06/07/2018 FINDINGS: The tip of the endotracheal tube appears to be 4.1 cm above the carina. Bibasilar opacities remain most consistent with atelectasis and effusions. Pneumonia cannot be excluded, nor and pulmonary vascular congestion. The heart is only mildly enlarged. Right central venous line tip overlies the upper SVC and no pneumothorax is seen. NG tube extends into the stomach. IMPRESSION: 1. Increasing opacification at the lung bases consistent with atelectasis, bilateral effusions, and possibly pneumonia. Pulmonary vascular congestion cannot be excluded. 2. Right central venous line tip overlies the upper SVC. 3. Endotracheal tube tip is approximately 4.1 cm above the carina. Electronically Signed   By: Ivar Drape M.D.   On: 06/09/2018 10:36   Dg Chest Port 1 View  Result Date: 06/08/2018 CLINICAL DATA:  Status post central line placement today. EXAM: PORTABLE CHEST 1 VIEW COMPARISON:  Single-view of the chest earlier today. FINDINGS: New double lumen central venous catheter from right IJ approach is in place with its tip in the mid to lower superior vena cava. Endotracheal tube and NG tube are unchanged. Defibrillator pad remains in place. Bilateral airspace disease and effusions are unchanged. No pneumothorax. IMPRESSION: Tip of new right IJ catheter projects in the mid to lower superior vena cava. No pneumothorax. No other change. Electronically Signed   By: Inge Rise M.D.   On: 06/08/2018 11:12   Medications: Infusions: . sodium chloride Stopped (06/08/18 0557)  . sodium chloride 10 mL/hr at 06/10/18 0700  . angiotensin II (GIAPREZA) infusion 25 ng/kg/min (06/10/18 0700)  . DOBUTamine 20 mcg/kg/min (06/10/18 0700)  .  epinephrine 10 mcg/min (06/10/18 0700)  . famotidine (PEPCID) IV Stopped (06/09/18 1046)  . feeding supplement (VITAL AF 1.2 CAL) 55 mL/hr at 06/10/18 0700  . fentaNYL infusion INTRAVENOUS 200 mcg/hr (06/10/18 0700)  . heparin 850 Units/hr (06/10/18 0700)  . midazolam (VERSED) infusion  10 mg/hr (06/10/18 0700)  . norepinephrine (LEVOPHED) Adult infusion 20 mcg/min (06/10/18 0700)  . piperacillin-tazobactam (ZOSYN)  IV Stopped (06/10/18 6725)  . dialysate (PRISMASATE) 1,500 mL/hr at 06/10/18 0602  . dialysis replacement fluid (prismasate) 300 mL/hr at 06/09/18 2324  . dialysis replacement fluid (prismasate) 350 mL/hr at 06/09/18 1910  . sodium chloride    . sodium chloride    . vancomycin Stopped (06/09/18 1019)  . vasopressin (PITRESSIN) infusion - *FOR SHOCK* 0.03 Units/min (06/10/18 0700)    Scheduled Medications: . chlorhexidine gluconate (MEDLINE KIT)  15 mL Mouth Rinse BID  . Chlorhexidine Gluconate Cloth  6 each Topical Q0600  . lidocaine  1 application Urethral Once  . lidocaine  1 application Urethral Once  . mouth rinse  15 mL Mouth Rinse 10 times per day  . sodium chloride flush  10-40 mL Intracatheter Q12H  . THROMBI-PAD  1 each Topical Once    have reviewed scheduled and prn medications.  Physical Exam: General: sedated , intubated  Heart: RRR Lungs: CBS bilat Abdomen: distended Extremities: pitting edema Dialysis Access: right IJ temp cath placed 10/23    06/10/2018,7:39 AM  LOS: 5 days

## 2018-06-10 NOTE — Progress Notes (Signed)
NAME:  Bryan Parker, MRN:  [id number], DOB:  Jan 13, 1946, LOS: 5 ADMISSION DATE:  08-Jun-2018, CONSULTATION DATE:  2018-06-08 REFERRING MD:  Dr. Rhunette Croft , CHIEF COMPLAINT:  Cardiac Arrest    Brief History   72 year old male presents to ED s/p Cardiac Arrest. Per Report patient called EMS for Abdominal Pain/Nausea/Vomiting. When EMS arrived patient was alert however went pulseless and PEA while on scene. ROSC achieved after 2 EPI and 10 minutes CPR. On arrival patient continued to be unstable and required multiple rounds of CPR. Fast exam negative. Bedside ECHO revealed slightly dilated RV. ABG 6.914/69.5/368. Progressive hypotension requiring Levophed/EPI/Vasopresin and 9 Bicarb pushes.    Wife reports that patient was complaining of severe left calf pain all day 10/19. Has a history of PE 2016. Was taken off anticoagulation 2018. Due to high suspicion of PE TNKase was given in ED. PCCM asked to admit.   Past Medical History  H/O DVT/PE, CKD  Significant Hospital Events   10/20 > Presents to ED  1021 with a partial code no CPR 10/22 bleeding from IV sites and foley, heparin d/c'd for 2 hours then resumed. Hgb 6.8 > 1u PRBC transfused 10/23 CRRT started.  Giapreza added. 10/24 coude catheter placed by urology and CBI started.  Consults: date of consult/date signed off & final recs:  PCCM 10/20 Heart failure 10/21 >  Urology 10/24 >   Procedures (surgical and bedside):  ETT 10/20 >> Right Femoral CVC 10/20 >> Left Femoral Aline 10/20 >>  L IJ HD cath 10/23 >   Significant Diagnostic Tests:  CXR 10/20 > neg CT Head 10/20 >> neg Echo 10/20 > EF 55 - 60%, severely dilated RV, mod dilated RA, mod TR.  Micro Data:  Blood 10/20 >> neg Sputum 10/20 >> neg U/A 10/20 >> neg  Antimicrobials:  zosyn 10/20 >> vanc 10/20 >>  Subjective:  Pressor requirements much improved after giaprezza added 10/23; however, remains on vaso0.03, NE 23, epi 10, dobut 20, giapreza 25.  CK up from  14.5 on 10/24 up to 35K on 10/25.  CRRT running.  Objective   Blood pressure (!) 107/43, pulse 78, temperature (!) 96.8 F (36 C), resp. rate 16, height 5\' 10"  (1.778 m), weight 81.8 kg, SpO2 100 %. CVP:  [5 mmHg-14 mmHg] 11 mmHg  Vent Mode: PRVC FiO2 (%):  [40 %] 40 % Set Rate:  [14 bmp] 14 bmp Vt Set:  [500 mL] 500 mL PEEP:  [5 cmH20] 5 cmH20 Plateau Pressure:  [14 cmH20-25 cmH20] 19 cmH20   Intake/Output Summary (Last 24 hours) at 06/10/2018 0957 Last data filed at 06/10/2018 0900 Gross per 24 hour  Intake 6021.94 ml  Output 7714 ml  Net -1692.06 ml   Filed Weights   06/08/18 0615 06/09/18 0500 06/10/18 0500  Weight: 88.2 kg 86.5 kg 81.8 kg    Examination:  General: Adult male, appears younger than stated age, resting in bed, in NAD. Neuro: Sedated but opens eyes to voice, follows some basic commands intermittently. HEENT: [place]/AT. Sclerae anicteric. MMM. Cardiovascular: RRR, no M/R/G.  Lungs: Respirations even and unlabored.  CTA bilaterally, No W/R/R. Abdomen: BS x 4, abd tight but not distended (improved). Musculoskeletal: No gross deformities, 1+ edema.  Skin: Intact, warm, no rashes.   Assessment & Plan:   Respiratory insufficiency - s/p intubation in ED. - Maintain full vent support, not ready to wean given pressor requirements. - Bronchial hygiene. - Follow CXR.  PEA Cardiac Arrest - concern due  to massive PE.  Deemed to not be a TTM candidate due to shock. Shock - likely mixed, cardiogenic due to above and likely septic with unclear etiology given PCT > 150. - Continue dobutamine, norepi, vaso, epi, giapreza. - Will need to decide / discuss timing of d/c giapreza given that it has now been on for 48 hours (studies based on 48 hrs). - Cardiology following.  Concern for PE - s/p TPA in ED. LLE DVT. Hx PE / DVT 2016 (no longer on anticoagulation). - Continue heparin gtt.  Septic shock - no clear source.  PCT > 150. - Continue antibiotics (vanc / zosyn). -  Monitor culture data. - Repeat PCT and follow daily.  AKI on Chronic Kidney Failure - likely exacerbated by rhabdo.  Now on CRRT. - Nephrology following, appreciate the assistance. - Correct electrolytes as indicated. - Follow BMP.  Rhabdomyolysis - CK up from 14.5K on 10/24 to 35K on 10/25 despite CRRT. - 2L LR bolus now. - Follow daily CK.  Hypocalcemia. -1g Ca gluconate.  Acute blood loss anemia - had bleeding from IV sites and foley overnight 10/22.  Received 1u PRBC and repeat Hgb 8.0.  Had urethral bleeding overnight 10/23 and received an additional 1u PRBC.  Hgb AM 10/25 is 7.6. - Transfuse 1u PRBC now. - Maintain Hgb > 8 following PEA arrest.  Urethral bleeding - likely traumatic following tPA administration. - Urology following, if pt survives then will eventually need CT scan and cystoscopy.  Thrombocytopenia. - Might need platelets if has continued bleeding.  Hyperglycemia. - Continue SSI.  Acute Encephalopathy - metabolic + from PEA arrest. - Continue supportive care. - Wean sedation as able.   Disposition / Summary of Today's Plan 06/10/18   Continue CRRT.  Continue to wean pressors (epi / norepi / vaso have been cut in half after addition of giaprezza). Give 2L LR bolus now and follow daily CK.  No further escalation. Discussed with wife 10/23 and 10/24.     Code Status: 06/07/2018 partial code no CPR Family Communication: 10/23 updated wife at bedside.  Labs   CBC: Recent Labs  Lab 2018/06/08 0439  06/08/18 0023 06/08/18 0441 06/09/18 0308 06/09/18 0921 06/09/18 1548 06/09/18 2125 06/10/18 0355  WBC 8.9   < > 26.8* 26.0* 21.9*  --   --  15.6* 15.1*  NEUTROABS 2.6  --   --  16.1*  --   --   --   --   --   HGB 11.6*   < > 6.8* 8.0* 7.0* 7.6* 8.1* 7.8* 7.6*  HCT 39.6   < > 18.8* 22.7* 19.9* 21.5* 24.0* 22.7* 22.5*  MCV 99.7   < > 81.7 81.4 80.2  --   --  83.8 84.9  PLT 201   < > 56* 56* 54*  --   --  50* 59*   < > = values in this interval not  displayed.    Basic Metabolic Panel: Recent Labs  Lab 06/07/18 0408 06/07/18 1234 06/08/18 0441 06/08/18 1121 06/08/18 1736 06/09/18 0308 06/09/18 1600 06/10/18 0355  NA 134* 135 134* 136 133* 132* 134* 136  K 4.7 5.6* 6.4* 5.6* 6.0* 5.5* 4.8 4.1  CL 103 108 107 111 104 105 105 104  CO2 17* 17* 16* 16* 19* 20* 21* 24  GLUCOSE 77 98 124* 76 123* 116* 135* 150*  BUN 36* 41* 49* 46* 44* 35* 29* 22  CREATININE 4.83* 5.23* 5.80* 5.31* 4.96* 4.12* 3.29* 2.90*  CALCIUM  5.3* 5.4* 5.1* 4.9* 5.6* 5.7* 5.7* 6.3*  MG 1.7 2.0 1.9  --   --  2.2  --  2.3  PHOS 6.7*  --  6.8*  --  6.8* 5.5*  5.6* 5.7* 4.7*   GFR: Estimated Creatinine Clearance: 24.1 mL/min (A) (by C-G formula based on SCr of 2.9 mg/dL (H)). Recent Labs  Lab 06-08-18 0854  06/08/18 2229 06/06/18 0124 06/06/18 0414  06/07/18 0408 06/07/18 1058  06/08/18 0441 06/08/18 1121 06/09/18 0308 06/09/18 2125 06/10/18 0355  PROCALCITON 0.38  --   --  >150.00  --   --  >150.00  --   --   --   --   --   --   --   WBC 18.4*   < >  --  30.5*  --    < > 25.6*  --    < > 26.0*  --  21.9* 15.6* 15.1*  LATICACIDVEN 18.2*   < > 9.9*  --  6.5*  --   --  2.8*  --   --  2.1*  --   --   --    < > = values in this interval not displayed.    Liver Function Tests: Recent Labs  Lab June 08, 2018 0439 June 08, 2018 1515 06/06/18 0807 06/07/18 0812 06/08/18 0441 06/08/18 1736 06/09/18 0308 06/09/18 1600 06/10/18 0355  AST 181* 1,122* 485* 271* 279*  --   --   --   --   ALT 141* 549* 266* 166* 145*  --   --   --   --   ALKPHOS 138* 136* 62 45 48  --   --   --   --   BILITOT 0.6 1.2 1.0 2.0* 2.3*  --   --   --   --   PROT 6.0* 4.0* <3.0* <3.0* 3.3*  --   --   --   --   ALBUMIN 3.2* 2.2* 1.5* 1.4* 1.4* 1.5* 1.5* 1.5* 1.5*   Recent Labs  Lab 06/08/18 1114  LIPASE 23   No results for input(s): AMMONIA in the last 168 hours.  ABG    Component Value Date/Time   PHART 7.393 06/10/2018 0343   PCO2ART 42.1 06/10/2018 0343   PO2ART 110.0  (H) 06/10/2018 0343   HCO3 25.6 06/10/2018 0343   TCO2 27 06/10/2018 0343   ACIDBASEDEF 1.0 06/09/2018 0452   O2SAT 65.5 06/10/2018 0357     Coagulation Profile: Recent Labs  Lab 2018-06-08 0439 June 08, 2018 0854 06/07/18 0408 06/09/18 0308  INR 1.56 >10.00* 1.95 1.17    Cardiac Enzymes: Recent Labs  Lab 06/08/18 0854 June 08, 2018 1935 06/06/18 0124 06/06/18 0807 06/08/18 1114 06/10/18 0355  CKTOTAL  --   --   --   --  14,417* 34,857*  CKMB  --   --   --   --  135.4* 278.9*  TROPONINI 1.98* 24.95* 20.53* 11.98*  --   --     HbA1C: No results found for: HGBA1C  CBG: Recent Labs  Lab 06/09/18 2006 06/09/18 2328 06/10/18 0335 06/10/18 0356 06/10/18 0752  GLUCAP 125* 127* 155* 145* 146*    CC time: 35 min  [name], PA - [facility] Pager: [phone]  or [phone] 06/10/2018, 9:57 AM

## 2018-06-10 NOTE — Progress Notes (Signed)
ANTICOAGULATION CONSULT NOTE   Pharmacy Consult for heparin Indication: suspected pulmonary embolus and confirmed DVT  No Known Allergies  Patient Measurements: Height: 5\' 10"  (177.8 cm) Weight: 180 lb 5.4 oz (81.8 kg) IBW/kg (Calculated) : 73  Vital Signs: Temp: 97.7 F (36.5 C) (10/25 1900) Temp Source: Esophageal (10/25 1600) BP: 104/60 (10/25 1900) Pulse Rate: 82 (10/25 1800)  Labs: Recent Labs    06/08/18 1114  06/09/18 0308  06/09/18 1548 06/09/18 1600  06/09/18 2125 06/10/18 0355 06/10/18 0928 06/10/18 1628 06/10/18 1752  HGB  --   --  7.0*   < > 8.1*  --   --  7.8* 7.6*  --   --   --   HCT  --   --  19.9*   < > 24.0*  --   --  22.7* 22.5*  --   --   --   PLT  --   --  54*  --   --   --   --  50* 59*  --   --   --   LABPROT  --   --  14.8  --   --   --   --   --   --   --   --   --   INR  --   --  1.17  --   --   --   --   --   --   --   --   --   HEPARINUNFRC  --    < >  --    < >  --   --    < >  --  0.29* 0.28*  --  0.31  CREATININE  --    < > 4.12*  --   --  3.29*  --   --  2.90*  --  2.35*  --   CKTOTAL 14,417*  --   --   --   --   --   --   --  34,857*  --   --   --   CKMB 135.4*  --   --   --   --   --   --   --  278.9*  --   --   --    < > = values in this interval not displayed.    Estimated Creatinine Clearance: 29.8 mL/min (A) (by C-G formula based on SCr of 2.35 mg/dL (H)).   Medical History: Past Medical History:  Diagnosis Date  . ARF (acute renal failure) (HCC) 06/06/2018  . Hypertension   . RVF (right ventricular failure) (HCC) 06/06/2018  . Vitamin D deficiency     Assessment: 72yo male admitted on 10/20 after witnessed arrest w/ EMS then multiple arrests in ED. PMH includes known h/o PE/DVT, now off anticoagulation at least 1 year. Reported by wife to have c/o calf pain on 10/19 >> concern for PE and given TNKase in ED on 10/20. Patient found to have DVT. No chest CT done to r/o PE given AKI.  Pharmacy has been consulted for heparin  dosing.  10/25: Heparin level this morning slightly subtherpaeutic at 0.29 on 850 units/hr. Will continue targeting lower heparin levels of 0.3-0.5 given recent bleeding from multiple sites. Difficult situation given concerns for PE/confirmed DVT but active bleeding. Early 10/24 when changing coude, 600 mL of frank blood output, heparin turned off briefly. Blood still noted in urine. Hgb 7.6, plt 50 (prior to bleeding event), received 2 units  pRBC yesterday. Hb and platelets likely low due to bleeding. HL rechecked, 0.28.  10/25 PM - Heparin level now within goal range.  Goal of Therapy:  Heparin level 0.3-0.5 units/ml Monitor platelets by anticoagulation protocol: Yes   Plan:  Continue IV heparin at current rate. Monitor daily heparin level and CBC, s/sx of bleeding  Thank you for involving pharmacy in this patient's care.  Jenetta Downer, [facility] Clinical Pharmacist Phone [phone]  06/10/2018 7:15 PM

## 2018-06-10 NOTE — Progress Notes (Signed)
ANTICOAGULATION CONSULT NOTE   Pharmacy Consult for heparin Indication: suspected pulmonary embolus and confirmed DVT  No Known Allergies  Patient Measurements: Height: 5\' 10"  (177.8 cm) Weight: 180 lb 5.4 oz (81.8 kg) IBW/kg (Calculated) : 73  Vital Signs: Temp: 96.8 F (36 C) (10/25 0743) Temp Source: Esophageal (10/25 0400) BP: 107/43 (10/25 0743) Pulse Rate: 78 (10/25 0743)  Labs: Recent Labs    06/08/18 1114  06/09/18 0308  06/09/18 1548 06/09/18 1600 06/09/18 1941 06/09/18 2125 06/10/18 0355 06/10/18 0928  HGB  --   --  7.0*   < > 8.1*  --   --  7.8* 7.6*  --   HCT  --   --  19.9*   < > 24.0*  --   --  22.7* 22.5*  --   PLT  --   --  54*  --   --   --   --  50* 59*  --   LABPROT  --   --  14.8  --   --   --   --   --   --   --   INR  --   --  1.17  --   --   --   --   --   --   --   HEPARINUNFRC  --    < >  --    < >  --   --  0.43  --  0.29* 0.28*  CREATININE  --    < > 4.12*  --   --  3.29*  --   --  2.90*  --   CKTOTAL 14,417*  --   --   --   --   --   --   --  34,857*  --   CKMB 135.4*  --   --   --   --   --   --   --  278.9*  --    < > = values in this interval not displayed.    Estimated Creatinine Clearance: 24.1 mL/min (A) (by C-G formula based on SCr of 2.9 mg/dL (H)).   Medical History: Past Medical History:  Diagnosis Date  . ARF (acute renal failure) (HCC) 06/06/2018  . Hypertension   . RVF (right ventricular failure) (HCC) 06/06/2018  . Vitamin D deficiency     Assessment: 72yo male admitted on 10/20 after witnessed arrest w/ EMS then multiple arrests in ED. PMH includes known h/o PE/DVT, now off anticoagulation at least 1 year. Reported by wife to have c/o calf pain on 10/19 >> concern for PE and given TNKase in ED on 10/20. Patient found to have DVT. No chest CT done to r/o PE given AKI.  Pharmacy has been consulted for heparin dosing.  10/25: Heparin level this morning slightly subtherpaeutic at 0.29 on 850 units/hr. Will continue  targeting lower heparin levels of 0.3-0.5 given recent bleeding from multiple sites. Difficult situation given concerns for PE/confirmed DVT but active bleeding. Early 10/24 when changing coude, 600 mL of frank blood output, heparin turned off briefly. Blood still noted in urine. Hgb 7.6, plt 50 (prior to bleeding event), received 2 units pRBC yesterday. Hb and platelets likely low due to bleeding. HL rechecked, 0.28.   Goal of Therapy:  Heparin level 0.3-0.5 units/ml Monitor platelets by anticoagulation protocol: Yes   Plan:  Increase heparin infusion sligthly to 900 units/hr Check heparin level in 8 hours Monitor daily heparin level and CBC, s/sx of bleeding  Thank you for involving pharmacy in this patient's care.  Marcelino Freestone, PharmD PGY2 Cardiology Pharmacy Resident Phone [phone] Please check AMION for all Pharmacist numbers by unit 06/10/2018 10:12 AM

## 2018-06-10 NOTE — Progress Notes (Signed)
Subjective: Patient remains critical on CRRT. UOP minimal - remains bloody   Objective: Vital signs in last 24 hours: Temp:  [95.9 F (35.5 C)-99.1 F (37.3 C)] 97.7 F (36.5 C) (10/25 1700) Pulse Rate:  [72-101] 82 (10/25 1700) Resp:  [12-17] 16 (10/25 1700) BP: (92-117)/(43-71) 98/56 (10/25 1700) SpO2:  [94 %-100 %] 97 % (10/25 1700) Arterial Line BP: (102-128)/(43-59) 108/47 (10/25 1700) FiO2 (%):  [40 %] 40 % (10/25 1600) Weight:  [81.8 kg] 81.8 kg (10/25 0500)  Intake/Output from previous day: 10/24 0701 - 10/25 0700 In: 5788.2 [I.V.:4000.3; Blood:552.5; NG/GT:765.4; IV Piggyback:400] Out: 7424 [Urine:380; Emesis/NG output:100; Stool:100] Intake/Output this shift: Total I/O In: 5167.1 [I.V.:1672.9; Blood:353.3; Other:230; NG/GT:485.8; IV Piggyback:2425.1] Out: 3791 [Urine:195; Emesis/NG output:100; Other:3496]  Physical Exam:  Intubated  Peno-scrotal and LE edema increased today -- urine a dark reddish purple   Procedure: foley irrigated with 1L water and minimal clots. Irrigated to pink. Irrigates well and with equal return.   Lab Results: Recent Labs    06/09/18 1548 06/09/18 2125 06/10/18 0355  HGB 8.1* 7.8* 7.6*  HCT 24.0* 22.7* 22.5*   BMET Recent Labs    06/09/18 1600 06/10/18 0355  NA 134* 136  K 4.8 4.1  CL 105 104  CO2 21* 24  GLUCOSE 135* 150*  BUN 29* 22  CREATININE 3.29* 2.90*  CALCIUM 5.7* 6.3*   Recent Labs    06/09/18 0308  INR 1.17   No results for input(s): LABURIN in the last 72 hours. Results for orders placed or performed during the hospital encounter of 06/09/2018  MRSA PCR Screening     Status: None   Collection Time: 06/06/2018  8:21 AM  Result Value Ref Range Status   MRSA by PCR NEGATIVE NEGATIVE Final    Comment:        The GeneXpert MRSA Assay (FDA approved for NASAL specimens only), is one component of a comprehensive MRSA colonization surveillance program. It is not intended to diagnose MRSA infection nor to  guide or monitor treatment for MRSA infections. Performed at [facility], [address], [place], [place] [address]   Culture, blood (routine x 2)     Status: None   Collection Time: 05/25/2018  9:00 AM  Result Value Ref Range Status   Specimen Description BLOOD RIGHT CENTRAL LINE  Final   Special Requests   Final    BOTTLES DRAWN AEROBIC AND ANAEROBIC Blood Culture adequate volume   Culture   Final    NO GROWTH 5 DAYS Performed at [facility], [address], [place], [place] [address]    Report Status 06/10/2018 FINAL  Final  Culture, blood (routine x 2)     Status: None   Collection Time: 05/21/2018  9:00 AM  Result Value Ref Range Status   Specimen Description BLOOD LEFT A-LINE  Final   Special Requests   Final    BOTTLES DRAWN AEROBIC AND ANAEROBIC Blood Culture adequate volume   Culture   Final    NO GROWTH 5 DAYS Performed at [facility], [address] [place], [place] [address]    Report Status 06/10/2018 FINAL  Final  Culture, respiratory (non-expectorated)     Status: None   Collection Time: 06/11/2018 11:30 AM  Result Value Ref Range Status   Specimen Description TRACHEAL ASPIRATE  Final   Special Requests NONE  Final   Gram Stain   Final    RARE WBC PRESENT, PREDOMINANTLY PMN MODERATE GRAM  POSITIVE COCCI IN PAIRS IN CHAINS RARE GRAM VARIABLE ROD    Culture   Final    MODERATE Consistent with normal respiratory flora. Performed at [facility], [address] [place], [place] [address]    Report Status 06/07/2018 FINAL  Final    Studies/Results: Dg Chest Port 1 View  Result Date: 06/10/2018 CLINICAL DATA:  Respiratory failure. EXAM: PORTABLE CHEST 1 VIEW COMPARISON:  Radiograph of June 09, 2018. FINDINGS: The heart size and mediastinal contours are within normal limits. No pneumothorax is noted. Endotracheal and nasogastric tubes are unchanged in position. Right internal jugular catheter is unchanged. Stable bibasilar edema or  atelectasis is noted with associated pleural effusions. Bony thorax is unremarkable. The visualized skeletal structures are unremarkable. IMPRESSION: Stable support apparatus. Stable bibasilar opacities as described above. Electronically Signed   By: Lupita Raider, M.D.   On: 06/10/2018 10:36   Dg Chest Port 1 View  Result Date: 06/09/2018 CLINICAL DATA:  Evaluate endotracheal tube, respiratory failure EXAM: PORTABLE CHEST 1 VIEW COMPARISON:  Portable chest x-ray of 06/08/2018 and 06/07/2018 FINDINGS: The tip of the endotracheal tube appears to be 4.1 cm above the carina. Bibasilar opacities remain most consistent with atelectasis and effusions. Pneumonia cannot be excluded, nor and pulmonary vascular congestion. The heart is only mildly enlarged. Right central venous line tip overlies the upper SVC and no pneumothorax is seen. NG tube extends into the stomach. IMPRESSION: 1. Increasing opacification at the lung bases consistent with atelectasis, bilateral effusions, and possibly pneumonia. Pulmonary vascular congestion cannot be excluded. 2. Right central venous line tip overlies the upper SVC. 3. Endotracheal tube tip is approximately 4.1 cm above the carina. Electronically Signed   By: Dwyane Dee M.D.   On: 06/09/2018 10:36    Assessment/Plan:  Gross hematuria - unknown etiology - prostate vs. Hemorrhagic cystitis vs upper tracts - does not seem to be heavy ongoing bleeding on irrigation. Minimal clots and clears quickly. Lack of UOP exacerbating appearance of urine. Rec CT A/P if and when stable enough for imaging.    LOS: 5 days   Jerilee Field 06/10/2018, 5:45 PM

## 2018-06-11 ENCOUNTER — Inpatient Hospital Stay (HOSPITAL_COMMUNITY): Payer: Medicare Other

## 2018-06-11 DIAGNOSIS — J9600 Acute respiratory failure, unspecified whether with hypoxia or hypercapnia: Secondary | ICD-10-CM

## 2018-06-11 LAB — RENAL FUNCTION PANEL
ALBUMIN: 1.6 g/dL — AB (ref 3.5–5.0)
ALBUMIN: 1.5 g/dL — AB (ref 3.5–5.0)
ANION GAP: 6 (ref 5–15)
Anion gap: 3 — ABNORMAL LOW (ref 5–15)
BUN: 17 mg/dL (ref 8–23)
BUN: 15 mg/dL (ref 8–23)
CALCIUM: 6.7 mg/dL — AB (ref 8.9–10.3)
CALCIUM: 6.7 mg/dL — AB (ref 8.9–10.3)
CO2: 24 mmol/L (ref 22–32)
CO2: 26 mmol/L (ref 22–32)
CREATININE: 2.1 mg/dL — AB (ref 0.61–1.24)
Chloride: 105 mmol/L (ref 98–111)
Chloride: 108 mmol/L (ref 98–111)
Creatinine, Ser: 2.19 mg/dL — ABNORMAL HIGH (ref 0.61–1.24)
GFR calc Af Amer: 33 mL/min — ABNORMAL LOW (ref >60)
GFR calc Af Amer: 35 mL/min — ABNORMAL LOW (ref >60)
GFR, EST NON AFRICAN AMERICAN: 29 mL/min — AB (ref >60)
GFR, EST NON AFRICAN AMERICAN: 30 mL/min — AB (ref >60)
GLUCOSE: 120 mg/dL — AB (ref 70–99)
GLUCOSE: 104 mg/dL — AB (ref 70–99)
PHOSPHORUS: 3.5 mg/dL (ref 2.5–4.6)
PHOSPHORUS: 3.2 mg/dL (ref 2.5–4.6)
Potassium: 4.6 mmol/L (ref 3.5–5.1)
Potassium: 4.6 mmol/L (ref 3.5–5.1)
SODIUM: 135 mmol/L (ref 135–145)
SODIUM: 137 mmol/L (ref 135–145)

## 2018-06-11 LAB — CBC
HEMATOCRIT: 25.3 % — AB (ref 39.0–52.0)
HEMOGLOBIN: 8.1 g/dL — AB (ref 13.0–17.0)
MCH: 28.7 pg (ref 26.0–34.0)
MCHC: 32 g/dL (ref 30.0–36.0)
MCV: 89.7 fL (ref 80.0–100.0)
NRBC: 0.4 % — AB (ref 0.0–0.2)
Platelets: 88 10*3/uL — ABNORMAL LOW (ref 150–400)
RBC: 2.82 MIL/uL — ABNORMAL LOW (ref 4.22–5.81)
RDW: 16.5 % — AB (ref 11.5–15.5)
WBC: 12.8 10*3/uL — AB (ref 4.0–10.5)

## 2018-06-11 LAB — POCT I-STAT 3, ART BLOOD GAS (G3+)
Bicarbonate: 26.8 mmol/L (ref 20.0–28.0)
O2 SAT: 97 %
TCO2: 28 mmol/L (ref 22–32)
pCO2 arterial: 50.1 mmHg — ABNORMAL HIGH (ref 32.0–48.0)
pH, Arterial: 7.333 — ABNORMAL LOW (ref 7.350–7.450)
pO2, Arterial: 100 mmHg (ref 83.0–108.0)

## 2018-06-11 LAB — LACTIC ACID, PLASMA: Lactic Acid, Venous: 1.1 mmol/L (ref 0.5–1.9)

## 2018-06-11 LAB — GLUCOSE, CAPILLARY
GLUCOSE-CAPILLARY: 115 mg/dL — AB (ref 70–99)
GLUCOSE-CAPILLARY: 138 mg/dL — AB (ref 70–99)
GLUCOSE-CAPILLARY: 89 mg/dL (ref 70–99)
GLUCOSE-CAPILLARY: 95 mg/dL (ref 70–99)
Glucose-Capillary: 104 mg/dL — ABNORMAL HIGH (ref 70–99)
Glucose-Capillary: 95 mg/dL (ref 70–99)

## 2018-06-11 LAB — HEPARIN LEVEL (UNFRACTIONATED)
HEPARIN UNFRACTIONATED: 0.23 [IU]/mL — AB (ref 0.30–0.70)
Heparin Unfractionated: 0.28 IU/mL — ABNORMAL LOW (ref 0.30–0.70)

## 2018-06-11 LAB — CALCIUM, IONIZED: CALCIUM, IONIZED, SERUM: 3.4 mg/dL — AB (ref 4.5–5.6)

## 2018-06-11 LAB — CK TOTAL AND CKMB (NOT AT ARMC)
CK TOTAL: 31504 U/L — AB (ref 49–397)
CK, MB: 245.1 ng/mL — AB (ref 0.5–5.0)
Relative Index: 0.8 (ref 0.0–2.5)

## 2018-06-11 LAB — PROCALCITONIN: Procalcitonin: 148.33 ng/mL

## 2018-06-11 LAB — COOXEMETRY PANEL
Carboxyhemoglobin: 1.5 % (ref 0.5–1.5)
Methemoglobin: 1.9 % — ABNORMAL HIGH (ref 0.0–1.5)
O2 Saturation: 68.4 %
Total hemoglobin: 8.4 g/dL — ABNORMAL LOW (ref 12.0–16.0)

## 2018-06-11 LAB — MAGNESIUM: MAGNESIUM: 2.4 mg/dL (ref 1.7–2.4)

## 2018-06-11 MED ORDER — PIPERACILLIN-TAZOBACTAM 3.375 G IVPB
3.3750 g | Freq: Four times a day (QID) | INTRAVENOUS | Status: DC
  Administered 2018-06-11 – 2018-06-16 (×19): 3.375 g via INTRAVENOUS
  Filled 2018-06-11 (×17): qty 50

## 2018-06-11 NOTE — Progress Notes (Signed)
ANTICOAGULATION CONSULT NOTE   Pharmacy Consult for heparin Indication: suspected pulmonary embolus and confirmed DVT  No Known Allergies  Patient Measurements: Height: 5\' 10"  (177.8 cm) Weight: 180 lb 5.4 oz (81.8 kg) IBW/kg (Calculated) : 73  Vital Signs: Temp: 97.9 F (36.6 C) (10/26 0400) Temp Source: Esophageal (10/26 0400) BP: 125/64 (10/26 0400) Pulse Rate: 78 (10/26 0400)  Labs: Recent Labs    06/08/18 1114  06/09/18 0308  06/09/18 1600  06/09/18 2125 06/10/18 0355 06/10/18 0928 06/10/18 1628 06/10/18 1752 06/11/18 0306  HGB  --   --  7.0*   < >  --   --  7.8* 7.6*  --   --   --  8.1*  HCT  --   --  19.9*   < >  --   --  22.7* 22.5*  --   --   --  25.3*  PLT  --   --  54*  --   --   --  50* 59*  --   --   --  88*  LABPROT  --   --  14.8  --   --   --   --   --   --   --   --   --   INR  --   --  1.17  --   --   --   --   --   --   --   --   --   HEPARINUNFRC  --    < >  --    < >  --    < >  --  0.29* 0.28*  --  0.31 0.23*  CREATININE  --    < > 4.12*  --  3.29*  --   --  2.90*  --  2.35*  --   --   CKTOTAL 14,417*  --   --   --   --   --   --  34,857*  --   --   --   --   CKMB 135.4*  --   --   --   --   --   --  278.9*  --   --   --   --    < > = values in this interval not displayed.    Estimated Creatinine Clearance: 29.8 mL/min (A) (by C-G formula based on SCr of 2.35 mg/dL (H)).  Assessment: 72yo male with DVT and likely PE s/p PEA arrest and TNKase 10/20 for heparin. Currently on CRRT with minimal UOP.   Gross hematuria and anemia s/p pRBC 10/25  Goal of Therapy:  Heparin level 0.3-0.5 units/ml Monitor platelets by anticoagulation protocol: Yes   Plan:  Increase Heparin 1000 units/hr Check heparin level in 8 hours.   Geannie Risen, PharmD, BCPS   06/11/2018 4:29 AM

## 2018-06-11 NOTE — Progress Notes (Signed)
Patient ID: Bryan Parker, male   DOB: 11/21/1945, 72 y.o.   MRN: [id number]     Advanced Heart Failure Rounding Note  PCP-Cardiologist: Traci Turner, MD   Subjective:     Urology consulted for urethral bleeding. Supportive care for now, but recommended further imaging if possible and ?TURP in future.   Intubated. Awake at times. Remains on CRRT pulling 100/hr. Weight down another 2 pounds. Urine output -370cc. CK 14,417 > 34,857 today > 31, 504  On vaso 0.03, norepi 16 (down from 23), epi 6 (down from 10), dobut 10, giapreza now off.   TFs off due to high residuals   MAPs low 70s. Coox 68% CVP 12-13  WBC 25.6 > 26 > 22 > 15 > 12.8. Afebrile.  PCT 150 -> 148 Remains on Vanc and Zosyn. Sputum + mod gram positive cocci, cx shows normal flora. Blood cultures NGTD.   Objective:   Weight Range: 80.9 kg Body mass index is 25.59 kg/m.   Vital Signs:   Temp:  [97.2 F (36.2 C)-98.1 F (36.7 C)] 97.3 F (36.3 C) (10/26 0700) Pulse Rate:  [74-101] 82 (10/26 0700) Resp:  [10-21] 15 (10/26 0700) BP: (96-125)/(46-76) 96/52 (10/26 0700) SpO2:  [94 %-100 %] 100 % (10/26 0743) Arterial Line BP: (102-124)/(42-53) 105/43 (10/26 0700) FiO2 (%):  [40 %] 40 % (10/26 0743) Weight:  [80.9 kg] 80.9 kg (10/26 0600) Last BM Date: 06/10/18  Weight change: Filed Weights   06/09/18 0500 06/10/18 0500 06/11/18 0600  Weight: 86.5 kg 81.8 kg 80.9 kg    Intake/Output:   Intake/Output Summary (Last 24 hours) at 06/11/2018 0936 Last data filed at 06/11/2018 0700 Gross per 24 hour  Intake 6455.98 ml  Output 6773 ml  Net -317.02 ml      Physical Exam    General: Intubated/sedated. Easily awakens HEENT: + ETT NG Neck: supple. JVP to jaw Carotids 2+ bilat; no bruits. No lymphadenopathy or thryomegaly appreciated. Cor: PMI nondisplaced. Distant tach regular  Lungs: clear anteriorly  Abdomen: soft, nontender, + distended. No hepatosplenomegaly. No bruits or masses. Good bowel  sounds. Extremities: no cyanosis, clubbing, rash, diffuse 3-4+ edema + boots. And mepalex dressings Neuro: awake on vent. Not following commands   Telemetry   Sinus tach 100-110 Personally reviewed.   EKG    No new tracings.   Labs    CBC Recent Labs    06/10/18 0355 06/11/18 0306  WBC 15.1* 12.8*  HGB 7.6* 8.1*  HCT 22.5* 25.3*  MCV 84.9 89.7  PLT 59* 88*   Basic Metabolic Panel Recent Labs    06/10/18 0355 06/10/18 1628 06/11/18 0306  NA 136 135 135  K 4.1 4.5 4.6  CL 104 105 105  CO2 24 25 24  GLUCOSE 150* 143* 120*  BUN 22 17 17  CREATININE 2.90* 2.35* 2.19*  CALCIUM 6.3* 6.5* 6.7*  MG 2.3  --  2.4  PHOS 4.7* 3.9 3.5   Liver Function Tests Recent Labs    06/10/18 1628 06/11/18 0306  ALBUMIN 1.6* 1.6*   Recent Labs    06/08/18 1114  LIPASE 23   Cardiac Enzymes Recent Labs    06/08/18 1114 06/10/18 0355 06/11/18 0306  CKTOTAL 14,417* 34,857* 31,504*  CKMB 135.4* 278.9* 245.1*    BNP: BNP (last 3 results) Recent Labs    05/19/2018 0854  BNP 18.0    ProBNP (last 3 results) No results for input(s): PROBNP in the last 8760 hours.   D-Dimer No results for   input(s): DDIMER in the last 72 hours. Hemoglobin A1C No results for input(s): HGBA1C in the last 72 hours. Fasting Lipid Panel No results for input(s): CHOL, HDL, LDLCALC, TRIG, CHOLHDL, LDLDIRECT in the last 72 hours. Thyroid Function Tests No results for input(s): TSH, T4TOTAL, T3FREE, THYROIDAB in the last 72 hours.  Invalid input(s): FREET3  Other results:   Imaging    Dg Chest Port 1 View  Result Date: 06/11/2018 CLINICAL DATA:  72-year-old male with respiratory failure status post PEA arrest. EXAM: PORTABLE CHEST 1 VIEW COMPARISON:  06/10/2018 and earlier. FINDINGS: Portable AP semi upright view at 0500 hours. Stable ET tube tip at the level the clavicles. Stable enteric tube, side hole the level of the proximal stomach. Stable right IJ central line. Stable lung  volumes. Mediastinal contours remain normal. Veiling opacity in both lungs with no pneumothorax. Upper lobe pulmonary vascularity is normal. Stable ventilation. Paucity bowel gas in the upper abdomen. IMPRESSION: 1.  Stable lines and tubes. 2. Continued stable ventilation with bilateral pleural effusions and lower lobe collapse or consolidation. Electronically Signed   By: H  Hall M.D.   On: 06/11/2018 07:42     Medications:     Scheduled Medications: . chlorhexidine gluconate (MEDLINE KIT)  15 mL Mouth Rinse BID  . Chlorhexidine Gluconate Cloth  6 each Topical Q0600  . lidocaine  1 application Urethral Once  . lidocaine  1 application Urethral Once  . mouth rinse  15 mL Mouth Rinse 10 times per day  . metoCLOPramide (REGLAN) injection  5 mg Intravenous Q6H  . sodium chloride flush  10-40 mL Intracatheter Q12H  . THROMBI-PAD  1 each Topical Once    Infusions: . sodium chloride Stopped (06/08/18 0557)  . sodium chloride 10 mL/hr at 06/11/18 0700  . DOBUTamine 10 mcg/kg/min (06/11/18 0700)  . epinephrine 6 mcg/min (06/11/18 0700)  . famotidine (PEPCID) IV Stopped (06/10/18 1031)  . feeding supplement (VITAL AF 1.2 CAL) Stopped (06/10/18 1550)  . fentaNYL infusion INTRAVENOUS 250 mcg/hr (06/11/18 0700)  . heparin 1,000 Units/hr (06/11/18 0700)  . midazolam (VERSED) infusion 8 mg/hr (06/11/18 0916)  . norepinephrine (LEVOPHED) Adult infusion 16 mcg/min (06/11/18 0700)  . piperacillin-tazobactam (ZOSYN)  IV Stopped (06/11/18 0544)  . dialysis replacement fluid (prismasate) 300 mL/hr at 06/10/18 1625  . dialysis replacement fluid (prismasate) 350 mL/hr at 06/11/18 0028  . prismasol BGK 4/2.5 1,500 mL/hr at 06/11/18 0622  . sodium chloride    . sodium chloride    . vasopressin (PITRESSIN) infusion - *FOR SHOCK* 0.03 Units/min (06/11/18 0700)    PRN Medications: sodium chloride, fentaNYL (SUBLIMAZE) injection, heparin, midazolam, sodium chloride, sodium chloride flush    Patient  Profile   Bryan Parker is a 72 y.o. 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  Presented to [facility] after PEA arrest with 10+ minutes down, presumed large PE. Now with RV failure.  Assessment/Plan   1. Shock: Mixed cardiogenic/sptic. 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 Also septic shock with very high PCT, fever, and presumed PNA.  Echo 10/2014 showed EF 60%, RV normal, PA peak pressure 73 mmHg.  Echo 06/01/2018 showed LV EF 55-60%, RV severely dilated and dysfunctional, moderate TR, moderately dilated RA, PA peak pressure 31 mmHg.  Now requiring multiple pressors/inotropes. MAPs improving. Weaning pressors slowly. - On CVVH, with stable BP can increase UF to 150/hr as he is massively volume overloaded with some skin breakdown -   Continue to wean inotrope support slowly as tolerated. Discussed with RN at bedside. Keep MAP > 65 - Continue iNO 30 ppm for now.  - Did not place RV impella with extensive bleeding post-TNKase and extensive clot.  - Coox 68%. 2. PEA arrest: Suspect secondary to massive PE.  Received 10+ minutes CPR. Head CT negative. Seems to be neurologically intact. No change. 3. Presumed massive PE: Has hx of PE in 2016, but has not been on chronic anticoagulation (off >1 year).  Lower extremity dopplers this admission positive for left popliteal acute DVT.  Received TNKase in ED.  - Continue heparin drip.  4. ID: WBCs trending down, 15.0-> 12.8  PCT was > 150. Now 148  Concerned for HCAP, possibly in setting of aspiration.  Cultures remain negative.  - On Zosyn and Vanc.  5. AKI on CKD: Baseline creatinine looks to be 1.6-2.2 range (cause of baseline CKD uncertain). He is now on CVVH with rising creatinine, poor UOP, and volume overload. Renal US negative for hydronephrosis.  AKI due to shock.  - Continue CVVH, can increase UF to150 cc/hr/ D/w Nephrology 6. Anemia: Bleeding from OGT and urethra  post-TNKase.  Bl eeding now stopped He has received multiple units PRBCs, hgb 8.1  today - Urology consulted. Supportive care for now, but recommended further imaging if possible and ?TURP in future.  7. Elevated liver enzymes: In setting of shock (ischemic hepatitis). Trend. No change. 8. Hypocalcemia: per nephro. 9. Acute respiratory hypoxemic respiratory failure: Remains intubated, suspect due to combination of PNA and pulmonary edema.  - Remains intubated. FiO2 40%. No change. 10. Thrombocytopenia: Plts up 59k-> 88k today.  Suspect fall in plts in setting of sepsis, critical illness/inflammation.  11. Rhabdomyolysis - CK starting to trend down slowly. No role for IVF in setting of ESRD - Per primary. Continue CRRT 12. Ileus - TFs on hold. Reglan started. KUB pending  CRITICAL CARE Performed by: Glori Bickers  Total critical care time: 35 minutes  Critical care time was exclusive of separately billable procedures and treating other patients.  Critical care was necessary to treat or prevent imminent or life-threatening deterioration.  Critical care was time spent personally by me (independent of midlevel providers or residents) on the following activities: development of treatment plan with patient and/or surrogate as well as nursing, discussions with consultants, evaluation of patient's response to treatment, examination of patient, obtaining history from patient or surrogate, ordering and performing treatments and interventions, ordering and review of laboratory studies, ordering and review of radiographic studies, pulse oximetry and re-evaluation of patient's condition.    Length of Stay: [place], MD  06/11/2018, 9:36 AM  Advanced Heart Failure Team Pager [phone] (M-F; 7a - 4p)  Please contact [facility] for night-coverage after hours (4p -7a ) and weekends on [email or web address]

## 2018-06-11 NOTE — Progress Notes (Signed)
ANTICOAGULATION CONSULT NOTE   Pharmacy Consult for heparin Indication: suspected pulmonary embolus and confirmed DVT  No Known Allergies  Patient Measurements: Height: 5\' 10"  (177.8 cm) Weight: 178 lb 5.6 oz (80.9 kg) IBW/kg (Calculated) : 73  Vital Signs: Temp: 98.2 F (36.8 C) (10/26 1100) Temp Source: Esophageal (10/26 0800) BP: 107/62 (10/26 1100) Pulse Rate: 88 (10/26 1100)  Labs: Recent Labs    06/09/18 0308  06/09/18 2125 06/10/18 0355  06/10/18 1628 06/10/18 1752 06/11/18 0306 06/11/18 1245  HGB 7.0*   < > 7.8* 7.6*  --   --   --  8.1*  --   HCT 19.9*   < > 22.7* 22.5*  --   --   --  25.3*  --   PLT 54*  --  50* 59*  --   --   --  88*  --   LABPROT 14.8  --   --   --   --   --   --   --   --   INR 1.17  --   --   --   --   --   --   --   --   HEPARINUNFRC  --    < >  --  0.29*   < >  --  0.31 0.23* 0.28*  CREATININE 4.12*   < >  --  2.90*  --  2.35*  --  2.19*  --   CKTOTAL  --   --   --  16,109*  --   --   --  31,504*  --   CKMB  --   --   --  278.9*  --   --   --  245.1*  --    < > = values in this interval not displayed.    Estimated Creatinine Clearance: 31.9 mL/min (A) (by C-G formula based on SCr of 2.19 mg/dL (H)).  Assessment: 72yo male with DVT and likely PE s/p PEA arrest and TNKase 10/20 for heparin. Currently on CRRT with minimal UOP.   Gross hematuria now resolved and anemia s/p pRBC 10/25.  Hemoglobin stable at 8.1, pltc improved to 88. Heparin level low this afternoon will adjust slightly given recent hematuria.   Goal of Therapy:  Heparin level 0.3-0.5 units/ml Monitor platelets by anticoagulation protocol: Yes   Plan:  Increase Heparin 1150 units/hr Daily heparin level and cbc  Sheppard Coil PharmD., BCPS Clinical Pharmacist 06/11/2018 2:10 PM

## 2018-06-11 NOTE — Progress Notes (Signed)
NAME:  Bryan Parker, MRN:  [id number], DOB:  12/04/1945, LOS: 6 ADMISSION DATE:  06/11/2018, CONSULTATION DATE:  05/17/2018 REFERRING MD:  Dr. Rhunette Croft , CHIEF COMPLAINT:  Cardiac Arrest    Brief History   72 year old male presents to ED s/p Cardiac Arrest. Per Report patient called EMS for Abdominal Pain/Nausea/Vomiting. When EMS arrived patient was alert however went pulseless and PEA while on scene. ROSC achieved after 2 EPI and 10 minutes CPR. On arrival patient continued to be unstable and required multiple rounds of CPR. Fast exam negative. Bedside ECHO revealed slightly dilated RV. ABG 6.914/69.5/368. Progressive hypotension requiring Levophed/EPI/Vasopresin and 9 Bicarb pushes.    Wife reports that patient was complaining of severe left calf pain all day 10/19. Has a history of PE 2016. Was taken off anticoagulation 2018. Due to high suspicion of PE TNKase was given in ED. PCCM asked to admit.   Past Medical History  H/O DVT/PE, CKD  Significant Hospital Events   10/20 > Presents to ED  1021 with a partial code no CPR 10/22 bleeding from IV sites and foley, heparin d/c'd for 2 hours then resumed. Hgb 6.8 > 1u PRBC transfused 10/23 CRRT started.  Giapreza added. 10/24 coude catheter placed by urology and CBI started. 10/25 - Pressor requirements much improved after giaprezza added 10/23; however, remains on vaso0.03, NE 23, epi 10, dobut 20, giapreza 25. CK up from 14.5 on 10/24 up to 35K on 10/25.  CRRT running.  Consults: date of consult/date signed off & final recs:  PCCM 10/20 Heart failure 10/21 >  Urology 10/24 >   Procedures (surgical and bedside):  ETT 10/20 >> Right Femoral CVC 10/20 >> Left Femoral Aline 10/20 >>  L IJ HD cath 10/23 >   Significant Diagnostic Tests:  CXR 10/20 > neg CT Head 10/20 >> neg Echo 10/20 > EF 55 - 60%, severely dilated RV, mod dilated RA, mod TR.  Micro Data:  Blood 10/20 >> neg Sputum 10/20 >> neg U/A 10/20 >>  neg  Antimicrobials:  zosyn 10/20 >> vanc 10/20 >>  SUBJECTIVE/OVERNIGHT/INTERVAL HX    10/26 - CK 31,000 (was 34,000 yesterday) . Volume removal continues. Fluid challenge strategy cancelled. CK better. Legs thinner with volume removal. Pressors: off giaprezza, levophed 6, dobutamine 10, epi 16 and vaso 0.03. Had vomit yesterday and TF on hold. Has bloody urine - seen by urology today   Objective   Blood pressure (!) 96/52, pulse 82, temperature (!) 97.3 F (36.3 C), resp. rate 15, height 5\' 10"  (1.778 m), weight 80.9 kg, SpO2 100 %. CVP:  [7 mmHg-16 mmHg] 12 mmHg  Vent Mode: PRVC FiO2 (%):  [40 %] 40 % Set Rate:  [14 bmp] 14 bmp Vt Set:  [500 mL] 500 mL PEEP:  [5 cmH20] 5 cmH20 Plateau Pressure:  [11 cmH20-19 cmH20] 15 cmH20   Intake/Output Summary (Last 24 hours) at 06/11/2018 0902 Last data filed at 06/11/2018 0700 Gross per 24 hour  Intake 6455.98 ml  Output 6773 ml  Net -317.02 ml   Filed Weights   06/09/18 0500 06/10/18 0500 06/11/18 0600  Weight: 86.5 kg 81.8 kg 80.9 kg    Examination:  General Appearance:  Looks criticall ill Head:  Normocephalic, without obvious abnormality, atraumatic Eyes:  PERRL - ys, conjunctiva/corneas - clear     Ears:  Normal external ear canals, both ears Nose:  G tube - no Throat:  ETT TUBE - yes , OG tube - yes Neck:  Supple,  No enlargement/tenderness/nodules Lungs: Clear to auscultation bilaterally, Ventilator   Synchrony - yes Heart:  S1 and S2 normal, no murmur, CVP - no.  Pressors - yes Abdomen:  Soft, no masses, no organomegaly Genitalia / Rectal:  Not done Extremities:  Extremities- intact, edema better Skin:  ntact in exposed areas . Neurologic:  Sedation - fent gtt, versed gtt -> RASS - -3      LABS    PULMONARY Recent Labs  Lab 06/08/18 0819 06/09/18 0452  06/09/18 1303 06/10/18 0343 06/10/18 0357 06/11/18 0310 06/11/18 0318  PHART 7.452* 7.487*  --  7.495* 7.393  --  7.333*  --   PCO2ART 23.0* 29.2*   --  29.9* 42.1  --  50.1*  --   PO2ART 94.0 121.0*  --  110.0* 110.0*  --  100.0  --   HCO3 16.2* 22.4  --  23.1 25.6  --  26.8  --   TCO2 17* 23  --  24 27  --  28  --   O2SAT 98.0 99.0   < > 99.0 98.0 65.5 97.0 68.4   < > = values in this interval not displayed.    CBC Recent Labs  Lab 06/09/18 2125 06/10/18 0355 06/11/18 0306  HGB 7.8* 7.6* 8.1*  HCT 22.7* 22.5* 25.3*  WBC 15.6* 15.1* 12.8*  PLT 50* 59* 88*    COAGULATION Recent Labs  Lab 06/16/2018 0439 05/25/2018 0854 06/07/18 0408 06/09/18 0308  INR 1.56 >10.00* 1.95 1.17    CARDIAC   Recent Labs  Lab 05/18/2018 0854 05/17/2018 1935 06/06/18 0124 06/06/18 0807  TROPONINI 1.98* 24.95* 20.53* 11.98*   No results for input(s): PROBNP in the last 168 hours.   CHEMISTRY Recent Labs  Lab 06/07/18 1234 06/08/18 0441  06/09/18 0308 06/09/18 1600 06/10/18 0355 06/10/18 1628 06/11/18 0306  NA 135 134*   < > 132* 134* 136 135 135  K 5.6* 6.4*   < > 5.5* 4.8 4.1 4.5 4.6  CL 108 107   < > 105 105 104 105 105  CO2 17* 16*   < > 20* 21* 24 25 24   GLUCOSE 98 124*   < > 116* 135* 150* 143* 120*  BUN 41* 49*   < > 35* 29* 22 17 17   CREATININE 5.23* 5.80*   < > 4.12* 3.29* 2.90* 2.35* 2.19*  CALCIUM 5.4* 5.1*   < > 5.7* 5.7* 6.3* 6.5* 6.7*  MG 2.0 1.9  --  2.2  --  2.3  --  2.4  PHOS  --  6.8*   < > 5.5*  5.6* 5.7* 4.7* 3.9 3.5   < > = values in this interval not displayed.   Estimated Creatinine Clearance: 31.9 mL/min (A) (by C-G formula based on SCr of 2.19 mg/dL (H)).   LIVER Recent Labs  Lab 05/18/2018 0439 06/16/2018 0854 05/21/2018 1515 06/06/18 8416 06/07/18 0408 06/07/18 6063 06/08/18 0441  06/09/18 0308 06/09/18 1600 06/10/18 0355 06/10/18 1628 06/11/18 0306  AST 181*  --  1,122* 485*  --  271* 279*  --   --   --   --   --   --   ALT 141*  --  549* 266*  --  166* 145*  --   --   --   --   --   --   ALKPHOS 138*  --  136* 62  --  45 48  --   --   --   --   --   --  BILITOT 0.6  --  1.2 1.0  --   2.0* 2.3*  --   --   --   --   --   --   PROT 6.0*  --  4.0* <3.0*  --  <3.0* 3.3*  --   --   --   --   --   --   ALBUMIN 3.2*  --  2.2* 1.5*  --  1.4* 1.4*   < > 1.5* 1.5* 1.5* 1.6* 1.6*  INR 1.56 >10.00*  --   --  1.95  --   --   --  1.17  --   --   --   --    < > = values in this interval not displayed.     INFECTIOUS Recent Labs  Lab 06/06/18 0124  06/07/18 0408 06/07/18 1058 06/08/18 1121 06/10/18 2337 06/11/18 0306  LATICACIDVEN  --    < >  --  2.8* 2.1* 1.1  --   PROCALCITON >150.00  --  >150.00  --   --   --  148.33   < > = values in this interval not displayed.     ENDOCRINE CBG (last 3)  Recent Labs    06/10/18 2338 06/11/18 0307 06/11/18 0745  GLUCAP 138* 115* 104*         IMAGING x48h  - image(s) personally visualized  -   highlighted in bold Dg Chest Port 1 View  Result Date: 06/11/2018 CLINICAL DATA:  72 year old male with respiratory failure status post PEA arrest. EXAM: PORTABLE CHEST 1 VIEW COMPARISON:  06/10/2018 and earlier. FINDINGS: Portable AP semi upright view at 0500 hours. Stable ET tube tip at the level the clavicles. Stable enteric tube, side hole the level of the proximal stomach. Stable right IJ central line. Stable lung volumes. Mediastinal contours remain normal. Veiling opacity in both lungs with no pneumothorax. Upper lobe pulmonary vascularity is normal. Stable ventilation. Paucity bowel gas in the upper abdomen. IMPRESSION: 1.  Stable lines and tubes. 2. Continued stable ventilation with bilateral pleural effusions and lower lobe collapse or consolidation. Electronically Signed   By: Odessa Fleming M.D.   On: 06/11/2018 07:42   Dg Chest Port 1 View  Result Date: 06/10/2018 CLINICAL DATA:  Respiratory failure. EXAM: PORTABLE CHEST 1 VIEW COMPARISON:  Radiograph of June 09, 2018. FINDINGS: The heart size and mediastinal contours are within normal limits. No pneumothorax is noted. Endotracheal and nasogastric tubes are unchanged in position.  Right internal jugular catheter is unchanged. Stable bibasilar edema or atelectasis is noted with associated pleural effusions. Bony thorax is unremarkable. The visualized skeletal structures are unremarkable. IMPRESSION: Stable support apparatus. Stable bibasilar opacities as described above. Electronically Signed   By: Lupita Raider, M.D.   On: 06/10/2018 10:36     Recent Results (from the past 240 hour(s))  MRSA PCR Screening     Status: None   Collection Time: 05/21/2018  8:21 AM  Result Value Ref Range Status   MRSA by PCR NEGATIVE NEGATIVE Final    Comment:        The GeneXpert MRSA Assay (FDA approved for NASAL specimens only), is one component of a comprehensive MRSA colonization surveillance program. It is not intended to diagnose MRSA infection nor to guide or monitor treatment for MRSA infections. Performed at [facility], [address], [place], [place] [address]   Culture, blood (routine x 2)     Status: None   Collection Time: 06/15/2018  9:00 AM  Result Value Ref Range Status   Specimen Description BLOOD RIGHT CENTRAL LINE  Final   Special Requests   Final    BOTTLES DRAWN AEROBIC AND ANAEROBIC Blood Culture adequate volume   Culture   Final    NO GROWTH 5 DAYS Performed at [facility], [address], [place], [place] [address]    Report Status 06/10/2018 FINAL  Final  Culture, blood (routine x 2)     Status: None   Collection Time: 05/30/2018  9:00 AM  Result Value Ref Range Status   Specimen Description BLOOD LEFT A-LINE  Final   Special Requests   Final    BOTTLES DRAWN AEROBIC AND ANAEROBIC Blood Culture adequate volume   Culture   Final    NO GROWTH 5 DAYS Performed at [facility], [address], [place], [place] [address]    Report Status 06/10/2018 FINAL  Final  Culture, respiratory (non-expectorated)     Status: None   Collection Time: 06/16/2018 11:30 AM  Result Value Ref Range Status   Specimen Description TRACHEAL ASPIRATE   Final   Special Requests NONE  Final   Gram Stain   Final    RARE WBC PRESENT, PREDOMINANTLY PMN MODERATE GRAM POSITIVE COCCI IN PAIRS IN CHAINS RARE GRAM VARIABLE ROD    Culture   Final    MODERATE Consistent with normal respiratory flora. Performed at [facility], [address], [place], [place] [address]    Report Status 06/07/2018 FINAL  Final     Assessment & Plan:   #Respiratory insufficiency - s/p intubation in ED.    06/11/2018 - > does NOT meet criteria for SBT/Extubation in setting of Acute Respiratory Failure due to shock  PLAN - PRVC. - Bronchial hygiene. - Follow CXR.  #PEA Cardiac Arrest - concern due to massive PE.  Deemed to not be a TTM candidate due to shock. Shock - likely mixed, cardiogenic due to above and likely septic with unclear etiology given PCT > 150.   10/26 - off giaprezzea. Continues on 4 others  plan - Continue dobutamine, norepi, vaso, epi,  - > slow titration of pressors for MAP goal > 65 - Cardiology following.  #Concern for PE - s/p TPA in ED. LLE DVT. Hx PE / DVT 2016 (no longer on anticoagulation).  10/26 - bleeding via foley appears improved Plan - Continue heparin gtt. - [place] CBC  #Septic shock - no clear source.  PCT > 150. - Continue antibiotics (vanc / zosyn). - Monitor culture data. - Repeat PCT and follow daily.  #AKI on Chronic Kidney Failure - likely exacerbated by arrest and ?  rhabdo.  Now on CRRT. - Nephrology following, appreciate the assistance. - Correct electrolytes as indicated. - Follow BMP.  #Rhabdomyolysis - CK up from 14.5K on 10/24 to 35K on 10/25 despite CRRT.  10/26 - CK improving PLAn - Follow daily CK.  - consider CT abd for RP hemorrhage when stable   #Acute blood loss anemia - had bleeding from IV sites and foley overnight 10/22.  Received 1u PRBC and repeat Hgb 8.0.  Had urethral bleeding overnight 10/23 and received an additional 1u PRBC. S/p repeat PRBC 06/10/18  10/26 -  hgb 8.1gm%. Active bleeding seems improving  PLAN - Maintain Hgb > 8 following PEA arrest.  Urethral bleeding - likely traumatic following tPA administration. - Urology following, if pt survives then will eventually need CT scan and cystoscopy.  #Thrombocytopenia.  10/26  - improving PLAn - Might need platelets if has continued bleeding.  #Hyperglycemia. - Continue SSI.  #Acute Encephalopathy - metabolic + from PEA arrest. - Continue supportive care. - Wean sedation as able.  #Vomit x 110/25/19a nd TF on hold PLAN  - check KUB to decide on TF restart   Disposition / Summary of Today's Plan 06/11/18   Weaning pressors. Continue CRRT. Check KUB  No further escalation. Discussed with wife 10/23 and 10/24.Code Status: 06/07/2018 partial code no CPR Family Communication: 10/23 updated wife at bedside.None at bedside 06/11/2018      ATTESTATION & SIGNATURE   The patient Bryan Parker is critically ill with multiple organ systems failure and requires high complexity decision making for assessment and support, frequent evaluation and titration of therapies, application of advanced monitoring technologies and extensive interpretation of multiple databases.   Critical Care Time devoted to patient care services described in this note is  30  Minutes. This time reflects time of care of this signee Dr Kalman Shan. This critical care time does not reflect procedure time, or teaching time or supervisory time of PA/NP/Med student/Med Resident etc but could involve care discussion time     Dr. Kalman Shan, M.D., [facility]C.P Pulmonary and Critical Care Medicine Staff Physician [facility] Pager: [phone], If no answer or between  15:00h - 7:00h: call [phone]  06/11/2018 9:02 AM

## 2018-06-11 NOTE — Progress Notes (Signed)
InterDry placed around pt's penis and scrotum for support r/t edema. Will continue to monitor.  Herma Ard, RN

## 2018-06-11 NOTE — Progress Notes (Signed)
Subjective:  Remains hemodynamically unstable overnight , CRRT running well - 370 of urine was recorded- only 450 out- given IVF for elevated CK    Objective Vital signs in last 24 hours: Vitals:   06/11/18 0500 06/11/18 0600 06/11/18 0700 06/11/18 0743  BP: (!) 109/56 (!) 103/53 (!) 96/52   Pulse: 89 (!) 101 82   Resp: 14 (!) 21 15   Temp: 97.9 F (36.6 C) (!) 97.3 F (36.3 C) (!) 97.3 F (36.3 C)   TempSrc:      SpO2: 99% 96% 100% 100%  Weight:  80.9 kg    Height:  5' 10"  (1.778 m)     Weight change: -0.9 kg  Intake/Output Summary (Last 24 hours) at 06/11/2018 0813 Last data filed at 06/11/2018 0700 Gross per 24 hour  Intake 6945.59 ml  Output 7288 ml  Net -342.41 ml    Assessment/Plan: 72 year old BM with CKD- (1.5 to 2.0 in 2016, upon presentation was 2.5) and history of PE who presented with PEA arrest, presumed PE with hemodynamic instability and A on CRF  1.Renal-acute on chronic kidney disease.  Not sure of the etiology of his chronic kidney disease.  However, his acute is most likely secondary to his multisystem organ failure and prolonged hypotension requiring pressors.  He is oliguric now requiring  renal replacement therapy.  CRRT started 10/23.  Running well, no heparin,  all 4 k bags.  I do not think that rhabdo is the cause of his AKI- is probably from code and being in ICU for several days- there is not an effective way to treat rhabdo in an oliguric pt who is already volume overloaded- just need to let it reabsorb 2. Hypertension/volume  -appears massively volume overloaded, receiving lots of drips and therefore lots of volume.  CVP had been near 20, is better now.  they have permission to pull 50-100 per hour- fell short of that yesterday because given fluid  3.  Hyperkalemia-resolved, change to all 4 k bags 4. Anemia-situational and multifactorial.  Has been transfused this admission-supportive care, per CCM- transfuse as needed 5.  Metabolic acidosis-  improved 6.  Hemodynamic instability-multiple pressors with inability to wean.  Cardiogenic versus septic shock per cardiology and CCM- some progress 7. Gross hematuria-  Renal u/s normal (no hydro) on 10/20, - urology placed cath- achieving good drainage for now, just very little UOP    [facility]    Labs: Basic Metabolic Panel: Recent Labs  Lab 06/10/18 0355 06/10/18 1628 06/11/18 0306  NA 136 135 135  K 4.1 4.5 4.6  CL 104 105 105  CO2 24 25 24   GLUCOSE 150* 143* 120*  BUN 22 17 17   CREATININE 2.90* 2.35* 2.19*  CALCIUM 6.3* 6.5* 6.7*  PHOS 4.7* 3.9 3.5   Liver Function Tests: Recent Labs  Lab 06/06/18 0807 06/07/18 0812 06/08/18 0441  06/10/18 0355 06/10/18 1628 06/11/18 0306  AST 485* 271* 279*  --   --   --   --   ALT 266* 166* 145*  --   --   --   --   ALKPHOS 62 45 48  --   --   --   --   BILITOT 1.0 2.0* 2.3*  --   --   --   --   PROT <3.0* <3.0* 3.3*  --   --   --   --   ALBUMIN 1.5* 1.4* 1.4*   < > 1.5* 1.6* 1.6*   < > =  values in this interval not displayed.   Recent Labs  Lab 06/08/18 1114  LIPASE 23   No results for input(s): AMMONIA in the last 168 hours. CBC: Recent Labs  Lab 06/14/2018 0439  06/08/18 0441 06/09/18 0308  06/09/18 2125 06/10/18 0355 06/11/18 0306  WBC 8.9   < > 26.0* 21.9*  --  15.6* 15.1* 12.8*  NEUTROABS 2.6  --  16.1*  --   --   --   --   --   HGB 11.6*   < > 8.0* 7.0*   < > 7.8* 7.6* 8.1*  HCT 39.6   < > 22.7* 19.9*   < > 22.7* 22.5* 25.3*  MCV 99.7   < > 81.4 80.2  --  83.8 84.9 89.7  PLT 201   < > 56* 54*  --  50* 59* 88*   < > = values in this interval not displayed.   Cardiac Enzymes: Recent Labs  Lab 05/22/2018 0854 06/10/2018 1935 06/06/18 0124 06/06/18 0807 06/08/18 1114 06/10/18 0355 06/11/18 0306  CKTOTAL  --   --   --   --  41,962* 34,857* 31,504*  CKMB  --   --   --   --  135.4* 278.9* 245.1*  TROPONINI 1.98* 24.95* 20.53* 11.98*  --   --   --    CBG: Recent Labs  Lab 06/10/18 1545  06/10/18 1931 06/10/18 2338 06/11/18 0307 06/11/18 0745  GLUCAP 135* 138* 138* 115* 104*    Iron Studies: No results for input(s): IRON, TIBC, TRANSFERRIN, FERRITIN in the last 72 hours. Studies/Results: Dg Chest Port 1 View  Result Date: 06/11/2018 CLINICAL DATA:  72 year old male with respiratory failure status post PEA arrest. EXAM: PORTABLE CHEST 1 VIEW COMPARISON:  06/10/2018 and earlier. FINDINGS: Portable AP semi upright view at 0500 hours. Stable ET tube tip at the level the clavicles. Stable enteric tube, side hole the level of the proximal stomach. Stable right IJ central line. Stable lung volumes. Mediastinal contours remain normal. Veiling opacity in both lungs with no pneumothorax. Upper lobe pulmonary vascularity is normal. Stable ventilation. Paucity bowel gas in the upper abdomen. IMPRESSION: 1.  Stable lines and tubes. 2. Continued stable ventilation with bilateral pleural effusions and lower lobe collapse or consolidation. Electronically Signed   By: Genevie Ann M.D.   On: 06/11/2018 07:42   Dg Chest Port 1 View  Result Date: 06/10/2018 CLINICAL DATA:  Respiratory failure. EXAM: PORTABLE CHEST 1 VIEW COMPARISON:  Radiograph of June 09, 2018. FINDINGS: The heart size and mediastinal contours are within normal limits. No pneumothorax is noted. Endotracheal and nasogastric tubes are unchanged in position. Right internal jugular catheter is unchanged. Stable bibasilar edema or atelectasis is noted with associated pleural effusions. Bony thorax is unremarkable. The visualized skeletal structures are unremarkable. IMPRESSION: Stable support apparatus. Stable bibasilar opacities as described above. Electronically Signed   By: Marijo Conception, M.D.   On: 06/10/2018 10:36   Medications: Infusions: . sodium chloride Stopped (06/08/18 0557)  . sodium chloride 10 mL/hr at 06/11/18 0700  . angiotensin II (GIAPREZA) infusion Stopped (06/10/18 1812)  . DOBUTamine 10 mcg/kg/min (06/11/18  0700)  . epinephrine 6 mcg/min (06/11/18 0700)  . famotidine (PEPCID) IV Stopped (06/10/18 1031)  . feeding supplement (VITAL AF 1.2 CAL) Stopped (06/10/18 1550)  . fentaNYL infusion INTRAVENOUS 250 mcg/hr (06/11/18 0700)  . heparin 1,000 Units/hr (06/11/18 0700)  . midazolam (VERSED) infusion 10 mg/hr (06/11/18 0700)  . norepinephrine (LEVOPHED) Adult infusion 16  mcg/min (06/11/18 0700)  . piperacillin-tazobactam (ZOSYN)  IV Stopped (06/11/18 0544)  . dialysis replacement fluid (prismasate) 300 mL/hr at 06/10/18 1625  . dialysis replacement fluid (prismasate) 350 mL/hr at 06/11/18 0028  . prismasol BGK 4/2.5 1,500 mL/hr at 06/11/18 2481  . sodium chloride    . sodium chloride    . vasopressin (PITRESSIN) infusion - *FOR SHOCK* 0.03 Units/min (06/11/18 0700)    Scheduled Medications: . chlorhexidine gluconate (MEDLINE KIT)  15 mL Mouth Rinse BID  . Chlorhexidine Gluconate Cloth  6 each Topical Q0600  . lidocaine  1 application Urethral Once  . lidocaine  1 application Urethral Once  . mouth rinse  15 mL Mouth Rinse 10 times per day  . metoCLOPramide (REGLAN) injection  5 mg Intravenous Q6H  . sodium chloride flush  10-40 mL Intracatheter Q12H  . THROMBI-PAD  1 each Topical Once    have reviewed scheduled and prn medications.  Physical Exam: General: sedated , intubated  Heart: RRR Lungs: CBS bilat Abdomen: distended Extremities: pitting edema Dialysis Access: right IJ temp cath placed 10/23    06/11/2018,8:13 AM  LOS: 6 days

## 2018-06-12 ENCOUNTER — Inpatient Hospital Stay (HOSPITAL_COMMUNITY): Payer: Medicare Other

## 2018-06-12 LAB — RENAL FUNCTION PANEL
ALBUMIN: 1.6 g/dL — AB (ref 3.5–5.0)
Albumin: 1.7 g/dL — ABNORMAL LOW (ref 3.5–5.0)
Anion gap: 7 (ref 5–15)
Anion gap: 7 (ref 5–15)
BUN: 14 mg/dL (ref 8–23)
BUN: 13 mg/dL (ref 8–23)
CALCIUM: 7.1 mg/dL — AB (ref 8.9–10.3)
CALCIUM: 7.3 mg/dL — AB (ref 8.9–10.3)
CHLORIDE: 105 mmol/L (ref 98–111)
CO2: 24 mmol/L (ref 22–32)
CO2: 25 mmol/L (ref 22–32)
CREATININE: 2.05 mg/dL — AB (ref 0.61–1.24)
Chloride: 105 mmol/L (ref 98–111)
Creatinine, Ser: 2.1 mg/dL — ABNORMAL HIGH (ref 0.61–1.24)
GFR calc Af Amer: 35 mL/min — ABNORMAL LOW (ref >60)
GFR calc Af Amer: 36 mL/min — ABNORMAL LOW (ref >60)
GFR calc non Af Amer: 30 mL/min — ABNORMAL LOW (ref >60)
GFR, EST NON AFRICAN AMERICAN: 31 mL/min — AB (ref >60)
GLUCOSE: 101 mg/dL — AB (ref 70–99)
Glucose, Bld: 88 mg/dL (ref 70–99)
PHOSPHORUS: 2.5 mg/dL (ref 2.5–4.6)
Phosphorus: 2.4 mg/dL — ABNORMAL LOW (ref 2.5–4.6)
Potassium: 4.5 mmol/L (ref 3.5–5.1)
Potassium: 4.5 mmol/L (ref 3.5–5.1)
SODIUM: 136 mmol/L (ref 135–145)
SODIUM: 137 mmol/L (ref 135–145)

## 2018-06-12 LAB — GLUCOSE, CAPILLARY
GLUCOSE-CAPILLARY: 89 mg/dL (ref 70–99)
Glucose-Capillary: 87 mg/dL (ref 70–99)
Glucose-Capillary: 81 mg/dL (ref 70–99)
Glucose-Capillary: 87 mg/dL (ref 70–99)
Glucose-Capillary: 100 mg/dL — ABNORMAL HIGH (ref 70–99)

## 2018-06-12 LAB — CBC
HCT: 24.3 % — ABNORMAL LOW (ref 39.0–52.0)
Hemoglobin: 7.7 g/dL — ABNORMAL LOW (ref 13.0–17.0)
MCH: 29.3 pg (ref 26.0–34.0)
MCHC: 31.7 g/dL (ref 30.0–36.0)
MCV: 92.4 fL (ref 80.0–100.0)
PLATELETS: 154 10*3/uL (ref 150–400)
RBC: 2.63 MIL/uL — ABNORMAL LOW (ref 4.22–5.81)
RDW: 17.2 % — AB (ref 11.5–15.5)
WBC: 15.4 10*3/uL — AB (ref 4.0–10.5)
nRBC: 0.4 % — ABNORMAL HIGH (ref 0.0–0.2)

## 2018-06-12 LAB — COOXEMETRY PANEL
Carboxyhemoglobin: 2 % — ABNORMAL HIGH (ref 0.5–1.5)
Methemoglobin: 1.5 % (ref 0.0–1.5)
O2 Saturation: 62.2 %
Total hemoglobin: 8.2 g/dL — ABNORMAL LOW (ref 12.0–16.0)

## 2018-06-12 LAB — MAGNESIUM: MAGNESIUM: 2.3 mg/dL (ref 1.7–2.4)

## 2018-06-12 LAB — CK TOTAL AND CKMB (NOT AT ARMC)
CK TOTAL: 24255 U/L — AB (ref 49–397)
CK, MB: 174.1 ng/mL — AB (ref 0.5–5.0)
Relative Index: 0.7 (ref 0.0–2.5)

## 2018-06-12 LAB — PREPARE RBC (CROSSMATCH)

## 2018-06-12 LAB — HEPARIN LEVEL (UNFRACTIONATED): HEPARIN UNFRACTIONATED: 0.35 [IU]/mL (ref 0.30–0.70)

## 2018-06-12 LAB — PROCALCITONIN: Procalcitonin: 75.62 ng/mL

## 2018-06-12 MED ORDER — DARBEPOETIN ALFA 200 MCG/0.4ML IJ SOSY
200.0000 ug | PREFILLED_SYRINGE | INTRAMUSCULAR | Status: DC
  Administered 2018-06-12 – 2018-06-26 (×3): 200 ug via SUBCUTANEOUS
  Filled 2018-06-12 (×3): qty 0.4

## 2018-06-12 MED ORDER — SODIUM CHLORIDE 0.9% IV SOLUTION
Freq: Once | INTRAVENOUS | Status: AC
  Administered 2018-06-12: 10 mL via INTRAVENOUS

## 2018-06-12 NOTE — Progress Notes (Signed)
NAME:  Bryan Parker, MRN:  [id number], DOB:  1945-11-29, LOS: 7 ADMISSION DATE:  06/08/2018, CONSULTATION DATE:  05/17/2018 REFERRING MD:  Dr. Rhunette Croft , CHIEF COMPLAINT:  Cardiac Arrest    Brief History   72 year old male presents to ED s/p Cardiac Arrest. Per Report patient called EMS for Abdominal Pain/Nausea/Vomiting. When EMS arrived patient was alert however went pulseless and PEA while on scene. ROSC achieved after 2 EPI and 10 minutes CPR. On arrival patient continued to be unstable and required multiple rounds of CPR. Fast exam negative. Bedside ECHO revealed slightly dilated RV. ABG 6.914/69.5/368. Progressive hypotension requiring Levophed/EPI/Vasopresin and 9 Bicarb pushes.    Wife reports that patient was complaining of severe left calf pain all day 10/19. Has a history of PE 2016. Was taken off anticoagulation 2018. Due to high suspicion of PE TNKase was given in ED. PCCM asked to admit.   Past Medical History  H/O DVT/PE, CKD  Significant Hospital Events   10/20 > Presents to ED  1021 with a partial code no CPR 10/22 bleeding from IV sites and foley, heparin d/c'd for 2 hours then resumed. Hgb 6.8 > 1u PRBC transfused 10/23 CRRT started.  Giapreza added. 10/24 coude catheter placed by urology and CBI started. 10/25 - Pressor requirements much improved after giaprezza added 10/23; however, remains on vaso0.03, NE 23, epi 10, dobut 20, giapreza 25. CK up from 14.5 on 10/24 up to 35K on 10/25.  CRRT running.  10/26 - CK 31,000 (was 34,000 yesterday) . Volume removal continues. Fluid challenge strategy cancelled. CK better. Legs thinner with volume removal. Pressors: off giaprezza, levophed 6, dobutamine 10, epi 16 and vaso 0.03. Had vomit yesterday and TF on hold. Has bloody urine - seen by urology today   Consults: date of consult/date signed off & final recs:  PCCM 10/20 Heart failure 10/21 >  Urology 10/24 >   Procedures (surgical and bedside):  ETT 10/20 >> Right  Femoral CVC 10/20 >> Left Femoral Aline 10/20 >>  L IJ HD cath 10/23 >   Significant Diagnostic Tests:  CXR 10/20 > neg CT Head 10/20 >> neg Echo 10/20 > EF 55 - 60%, severely dilated RV, mod dilated RA, mod TR.  Micro Data:  Blood 10/20 >> neg Sputum 10/20 >> neg U/A 10/20 >> neg  Antimicrobials:  zosyn 10/20 >> vanc 10/20 >>  SUBJECTIVE/OVERNIGHT/INTERVAL HX   10/27  - pressor needs coming down - off epi gtt. Dobutamine 10, Vaso 0.03 and levophed 4-37mcg/min. . Still largely volume . Volume removal via CRRT. CK down to 24K. TF still on hold. KUB - no ileus. Heavy sedation +  Objective   Blood pressure (!) 97/55, pulse 86, temperature 98.8 F (37.1 C), resp. rate 14, height 5\' 10"  (1.778 m), weight 79 kg, SpO2 100 %. CVP:  [10 mmHg-13 mmHg] 12 mmHg  Vent Mode: PRVC FiO2 (%):  [40 %] 40 % Set Rate:  [14 bmp] 14 bmp Vt Set:  [500 mL] 500 mL PEEP:  [5 cmH20] 5 cmH20 Plateau Pressure:  [15 cmH20-18 cmH20] 15 cmH20   Intake/Output Summary (Last 24 hours) at 06/12/2018 0853 Last data filed at 06/12/2018 0700 Gross per 24 hour  Intake 2142.83 ml  Output 5811 ml  Net -3668.17 ml   Filed Weights   06/10/18 0500 06/11/18 0600 06/12/18 0500  Weight: 81.8 kg 80.9 kg 79 kg   General Appearance:  Looks criticall ill Head:  Normocephalic, without obvious abnormality, atraumatic Eyes:  PERRL - yes, conjunctiva/corneas - clea     Ears:  Normal external ear canals, both ears Nose:  G tube - no Throat:  ETT TUBE - yes , OG tube - yes Neck:  Supple,  No enlargement/tenderness/nodules Lungs: Clear to auscultation bilaterally, Ventilator   Synchrony - yes Heart:  S1 and S2 normal, no murmur, CVP - x.  Pressors - yes and improved Abdomen:  Soft, no masses, no organomegaly Genitalia / Rectal:  Foley with blood output - lighter now Extremities:  Extremities- intact, boots on Skin:  ntact in exposed areas .  Neurologic:  Sedation - fen gtt/versed gtt -> RASS - -4 (agitated on  wua)       Assessment & Plan:   #acute resp failure - s/p intubation in ED.  06/12/2018 - > does not meet criteria for SBT/Extubation in setting of Acute Respiratory Failure due to circulatory shock  PLAN - PRVC - VAP bundle  #PEA Cardiac Arrest - concern due to massive PE.  Deemed to not be a TTM candidate due to shock. Shock - likely mixed, cardiogenic due to above and likely septic with unclear etiology given PCT > 150.   10/27- on vaso, dobutamine and levophed at lower doses. Off epi. Of giaprezza  plan -pressors for MAP > 65 - Cardiology following.  #Concern for PE - s/p TPA in ED. LLE DVT. Hx PE / DVT 2016 (no longer on anticoagulation).  10/27 - bleeding via foley better. Foley getting irrigated Plan - Continue heparin gtt. - Monito CBC  #Septic shock - no clear source.  PCT > 150. - Continue antibiotics (vanc / zosyn). - Monitor culture data. - Repeat PCT and follow daily.  #AKI on Chronic Kidney Failure - likely exacerbated by arrest and ?  rhabdo.  Now on CRRT. - Nephrology following, appreciate the assistance. - Correct electrolytes as indicated. - Follow BMP.  #Rhabdomyolysis - CK up from 14.5K on 10/24 to 35K on 10/25 despite CRRT.  10/26 - CK improving PLAn - Follow daily CK.  - consider CT abd for RP hemorrhage when stable   #Acute blood loss anemia - had bleeding from IV sites and foley overnight 10/22.  Received 1u PRBC and repeat Hgb 8.0.  Had urethral bleeding overnight 10/23 and received an additional 1u PRBC. S/p repeat PRBC 06/10/18   10/27 - hgb  < 8gm%  PLAN - Maintain Hgb > 8 following PEA arrest.; repeat PRBC x 1 - at some point change hgb  Goal > 7gm%  Urethral bleeding - likely traumatic following tPA administration. - Urology following, if pt survives then will eventually need CT scan and cystoscopy. - getting daily irrigation by RRN  #Thrombocytopenia.  10/27 - resolve PLAn - monitor  #Hyperglycemia. - Continue  SSI.  #Acute Encephalopathy - metabolic + from PEA arrest. - Continue supportive care. - Wean sedation as able.  #Vomit x 110/25/19a nd TF on hold. KUB - no ileus PLAN  -restart TF at trickle and stay at trickle thrugh 06/13/18 before readjustment   Disposition / Summary of Today's Plan 06/12/18    Full ICU care  No further escalation. Discussed with wife 10/23 and 10/24.Code Status: 06/07/2018 partial code no CPR Family Communication: 10/23 updated wife at bedside.None at bedside 06/12/2018      ATTESTATION & SIGNATURE   The patient EDDER BELLANCA is critically ill with multiple organ systems failure and requires high complexity decision making for assessment and support, frequent evaluation and titration of therapies, application  of advanced monitoring technologies and extensive interpretation of multiple databases.   Critical Care Time devoted to patient care services described in this note is  30  Minutes. This time reflects time of care of this signee Dr Kalman Shan. This critical care time does not reflect procedure time, or teaching time or supervisory time of PA/NP/Med student/Med Resident etc but could involve care discussion time     Dr. Kalman Shan, M.D., [facility] Pulmonary and Critical Care Medicine Staff Physician [facility] Pager: [phone], If no answer or between  15:00h - 7:00h: call [phone]  06/12/2018 8:58 AM    LABS    PULMONARY Recent Labs  Lab 06/08/18 0819 06/09/18 0452  06/09/18 1303 06/10/18 0343 06/10/18 0357 06/11/18 0310 06/11/18 0318 06/12/18 0435  PHART 7.452* 7.487*  --  7.495* 7.393  --  7.333*  --   --   PCO2ART 23.0* 29.2*  --  29.9* 42.1  --  50.1*  --   --   PO2ART 94.0 121.0*  --  110.0* 110.0*  --  100.0  --   --   HCO3 16.2* 22.4  --  23.1 25.6  --  26.8  --   --   TCO2 17* 23  --  24 27  --  28  --   --   O2SAT 98.0 99.0   < > 99.0 98.0 65.5 97.0 68.4  62.2   < > = values in this interval not displayed.    CBC Recent Labs  Lab 06/10/18 0355 06/11/18 0306 06/12/18 0421  HGB 7.6* 8.1* 7.7*  HCT 22.5* 25.3* 24.3*  WBC 15.1* 12.8* 15.4*  PLT 59* 88* 154    COAGULATION Recent Labs  Lab 06/07/18 0408 06/09/18 0308  INR 1.95 1.17    CARDIAC   Recent Labs  Lab Jun 29, 2018 1935 06/06/18 0124 06/06/18 0807  TROPONINI 24.95* 20.53* 11.98*   No results for input(s): PROBNP in the last 168 hours.   CHEMISTRY Recent Labs  Lab 06/08/18 0441  06/09/18 0308  06/10/18 0355 06/10/18 1628 06/11/18 0306 06/11/18 1559 06/12/18 0421  NA 134*   < > 132*   < > 136 135 135 137 136  K 6.4*   < > 5.5*   < > 4.1 4.5 4.6 4.6 4.5  CL 107   < > 105   < > 104 105 105 108 105  CO2 16*   < > 20*   < > 24 25 24 26 24   GLUCOSE 124*   < > 116*   < > 150* 143* 120* 104* 88  BUN 49*   < > 35*   < > 22 17 17 15 14   CREATININE 5.80*   < > 4.12*   < > 2.90* 2.35* 2.19* 2.10* 2.10*  CALCIUM 5.1*   < > 5.7*   < > 6.3* 6.5* 6.7* 6.7* 7.1*  MG 1.9  --  2.2  --  2.3  --  2.4  --  2.3  PHOS 6.8*   < > 5.5*  5.6*   < > 4.7* 3.9 3.5 3.2 2.5   < > = values in this interval not displayed.   Estimated Creatinine Clearance: 33.3 mL/min (A) (by C-G formula based on SCr of 2.1 mg/dL (H)).   LIVER Recent Labs  Lab Jun 29, 2018 1515 06/06/18 2130 06/07/18 0408 06/07/18 8657 06/08/18 8469  06/09/18 0308  06/10/18 6295 06/10/18 1628 06/11/18 2841  06/11/18 1559 06/12/18 0421  AST 1,122* 485*  --  271* 279*  --   --   --   --   --   --   --   --   ALT 549* 266*  --  166* 145*  --   --   --   --   --   --   --   --   ALKPHOS 136* 62  --  45 48  --   --   --   --   --   --   --   --   BILITOT 1.2 1.0  --  2.0* 2.3*  --   --   --   --   --   --   --   --   PROT 4.0* <3.0*  --  <3.0* 3.3*  --   --   --   --   --   --   --   --   ALBUMIN 2.2* 1.5*  --  1.4* 1.4*   < > 1.5*   < > 1.5* 1.6* 1.6* 1.5* 1.6*  INR  --   --  1.95  --   --   --  1.17  --   --   --    --   --   --    < > = values in this interval not displayed.     INFECTIOUS Recent Labs  Lab 06/07/18 0408 06/07/18 1058 06/08/18 1121 06/10/18 2337 06/11/18 0306 06/12/18 0421  LATICACIDVEN  --  2.8* 2.1* 1.1  --   --   PROCALCITON >150.00  --   --   --  148.33 75.62     ENDOCRINE CBG (last 3)  Recent Labs    06/11/18 1953 06/12/18 0015 06/12/18 0419  GLUCAP 95 89 87         IMAGING x48h  - image(s) personally visualized  -   highlighted in bold Dg Abd 1 View  Result Date: 06/11/2018 CLINICAL DATA:  Vomiting. EXAM: ABDOMEN - 1 VIEW COMPARISON:  None. FINDINGS: A right femoral line is in good position. The distal end of the NG tube is somewhat obscured by overlapping wires but appears to terminate in the distal stomach. No other acute abnormalities. IMPRESSION: Support apparatus as above.  No acute interval change noted. Electronically Signed   By: Gerome Sam III M.D   On: 06/11/2018 12:21   Dg Chest Port 1 View  Result Date: 06/11/2018 CLINICAL DATA:  72 year old male with respiratory failure status post PEA arrest. EXAM: PORTABLE CHEST 1 VIEW COMPARISON:  06/10/2018 and earlier. FINDINGS: Portable AP semi upright view at 0500 hours. Stable ET tube tip at the level the clavicles. Stable enteric tube, side hole the level of the proximal stomach. Stable right IJ central line. Stable lung volumes. Mediastinal contours remain normal. Veiling opacity in both lungs with no pneumothorax. Upper lobe pulmonary vascularity is normal. Stable ventilation. Paucity bowel gas in the upper abdomen. IMPRESSION: 1.  Stable lines and tubes. 2. Continued stable ventilation with bilateral pleural effusions and lower lobe collapse or consolidation. Electronically Signed   By: Odessa Fleming M.D.   On: 06/11/2018 07:42

## 2018-06-12 NOTE — Progress Notes (Signed)
Urethral catheter flushed per urologist instructions. Flushed with of sterile water in small amounts over a few minutes, 250 returned. Urine bloody with blood clots. After a few flushes, urine light pink upon completion. Catheter cleaned and peri care completed.

## 2018-06-12 NOTE — Progress Notes (Signed)
Urethral catheter flushed per urologist instructions. Flushed with of sterile water in small amounts over approximately 10 minutes. 200  Ml returned, starting with a red color and slowly clearly to a very lightly tinged pink when complete. Catheter cleaned and peri care completed.

## 2018-06-12 NOTE — Progress Notes (Signed)
Patient ID: Bryan Parker, male   DOB: Feb 11, 1946, 72 y.o.   MRN: [id number]     Advanced Heart Failure Rounding Note  PCP-Cardiologist: Fransico Him, MD   Subjective:     Urology consulted for urethral bleeding. Supportive care for now, but recommended further imaging if possible and ?TURP in future.   Intubated. Awake at times. Remains on CRRT,  UF 5883 cc yesterday.  Still with minimal UOP.  Weight down another 4 pounds.  CK 14,417 > 34,857 > 31,504 > 24,255  On vaso 0.03, norepi 4, epi 2, dobut 10, giapreza now off. Slowly coming down on pressors.   MAP around 65. Coox 62%, CVP 12.  WBC 25.6 > 26 > 22 > 15 > 12.8 > 15. Afebrile.  PCT 150 -> 148 -> 75.  Remains on Vanc and Zosyn. Sputum + mod gram positive cocci, cx shows normal flora. Blood cultures NGTD.   Objective:   Weight Range: 79 kg Body mass index is 24.99 kg/m.   Vital Signs:   Temp:  [97.9 F (36.6 C)-99.9 F (37.7 C)] 98.8 F (37.1 C) (10/27 0700) Pulse Rate:  [76-119] 86 (10/27 0700) Resp:  [13-26] 14 (10/27 0700) BP: (97-133)/(51-88) 97/55 (10/27 0700) SpO2:  [71 %-100 %] 100 % (10/27 0737) Arterial Line BP: (108-153)/(46-72) 111/46 (10/27 0700) FiO2 (%):  [40 %] 40 % (10/27 0737) Weight:  [79 kg] 79 kg (10/27 0500) Last BM Date: 06/10/18  Weight change: Filed Weights   06/10/18 0500 06/11/18 0600 06/12/18 0500  Weight: 81.8 kg 80.9 kg 79 kg    Intake/Output:   Intake/Output Summary (Last 24 hours) at 06/12/2018 0800 Last data filed at 06/12/2018 0700 Gross per 24 hour  Intake 2142.83 ml  Output 5811 ml  Net -3668.17 ml      Physical Exam    General: Intubated/sedated.  Neck: JVP 10 +, no thyromegaly or thyroid nodule.  Lungs: decreased BS dependently.  CV: Nondisplaced PMI.  Heart regular S1/S2, no S3/S4, no murmur.  2+ edema to thighs.   Abdomen: Soft, no hepatosplenomegaly, no distention.  Skin: skin breakdown lower legs Neurologic:Sedated.   Extremities: No clubbing or cyanosis.    HEENT: Normal.    Telemetry   NSR 90s, personally reviewed.   EKG    No new tracings.   Labs    CBC Recent Labs    06/11/18 0306 06/12/18 0421  WBC 12.8* 15.4*  HGB 8.1* 7.7*  HCT 25.3* 24.3*  MCV 89.7 92.4  PLT 88* 817   Basic Metabolic Panel Recent Labs    06/11/18 0306 06/11/18 1559 06/12/18 0421  NA 135 137 136  K 4.6 4.6 4.5  CL 105 108 105  CO2 24 26 24   GLUCOSE 120* 104* 88  BUN 17 15 14   CREATININE 2.19* 2.10* 2.10*  CALCIUM 6.7* 6.7* 7.1*  MG 2.4  --  2.3  PHOS 3.5 3.2 2.5   Liver Function Tests Recent Labs    06/11/18 1559 06/12/18 0421  ALBUMIN 1.5* 1.6*   No results for input(s): LIPASE, AMYLASE in the last 72 hours. Cardiac Enzymes Recent Labs    06/10/18 0355 06/11/18 0306 06/12/18 0421  CKTOTAL 34,857* 31,504* 24,255*  CKMB 278.9* 245.1* 174.1*    BNP: BNP (last 3 results) Recent Labs    05/23/2018 0854  BNP 18.0    ProBNP (last 3 results) No results for input(s): PROBNP in the last 8760 hours.   D-Dimer No results for input(s): DDIMER in the last 72  hours. Hemoglobin A1C No results for input(s): HGBA1C in the last 72 hours. Fasting Lipid Panel No results for input(s): CHOL, HDL, LDLCALC, TRIG, CHOLHDL, LDLDIRECT in the last 72 hours. Thyroid Function Tests No results for input(s): TSH, T4TOTAL, T3FREE, THYROIDAB in the last 72 hours.  Invalid input(s): FREET3  Other results:   Imaging    Dg Abd 1 View  Result Date: 06/11/2018 CLINICAL DATA:  Vomiting. EXAM: ABDOMEN - 1 VIEW COMPARISON:  None. FINDINGS: A right femoral line is in good position. The distal end of the NG tube is somewhat obscured by overlapping wires but appears to terminate in the distal stomach. No other acute abnormalities. IMPRESSION: Support apparatus as above.  No acute interval change noted. Electronically Signed   By: Dorise Bullion III M.D   On: 06/11/2018 12:21     Medications:     Scheduled Medications: . chlorhexidine  gluconate (MEDLINE KIT)  15 mL Mouth Rinse BID  . Chlorhexidine Gluconate Cloth  6 each Topical Q0600  . lidocaine  1 application Urethral Once  . lidocaine  1 application Urethral Once  . mouth rinse  15 mL Mouth Rinse 10 times per day  . metoCLOPramide (REGLAN) injection  5 mg Intravenous Q6H  . sodium chloride flush  10-40 mL Intracatheter Q12H  . THROMBI-PAD  1 each Topical Once    Infusions: . sodium chloride Stopped (06/08/18 0557)  . sodium chloride Stopped (06/11/18 1937)  . DOBUTamine 10 mcg/kg/min (06/12/18 0700)  . epinephrine Stopped (06/11/18 2039)  . famotidine (PEPCID) IV 20 mg (06/11/18 1034)  . feeding supplement (VITAL AF 1.2 CAL) Stopped (06/10/18 1550)  . fentaNYL infusion INTRAVENOUS 250 mcg/hr (06/12/18 0700)  . heparin 1,150 Units/hr (06/12/18 0700)  . midazolam (VERSED) infusion 10 mg/hr (06/12/18 0700)  . norepinephrine (LEVOPHED) Adult infusion 4 mcg/min (06/12/18 0700)  . piperacillin-tazobactam (ZOSYN)  IV 3.375 g (06/12/18 0517)  . dialysis replacement fluid (prismasate) 300 mL/hr at 06/12/18 0313  . dialysis replacement fluid (prismasate) 350 mL/hr at 06/12/18 0442  . prismasol BGK 4/2.5 1,500 mL/hr at 06/12/18 0701  . sodium chloride    . sodium chloride    . vasopressin (PITRESSIN) infusion - *FOR SHOCK* 0.03 Units/min (06/12/18 0700)    PRN Medications: sodium chloride, fentaNYL (SUBLIMAZE) injection, heparin, midazolam, sodium chloride, sodium chloride flush    Patient Profile   Bryan Parker is a 72 y.o. 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  Presented to [facility] after PEA arrest with 10+ minutes down, presumed large PE. Now with RV failure.  Assessment/Plan   1. Shock: Mixed cardiogenic/sptic. 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 Also septic shock with very high PCT, fever, and presumed PNA.  Echo 10/2014 showed EF 60%, RV normal, PA peak pressure 73  mmHg.  Echo 06/04/2018 showed LV EF 55-60%, RV severely dilated and dysfunctional, moderate TR, moderately dilated RA, PA peak pressure 31 mmHg.  Now requiring multiple pressors/inotropes. MAP improving gradually, weaning pressors slowly. He remains markedly volume overloaded. Co-ox 62%, CVP 12.  - Continue CVVH for volume removal, had good day yesterday with weight down 4 lbs.  - Continue to wean pressor support slowly as tolerated. Keep MAP around 65.  Would continue current dobutamine for now for RV support.  - Continue iNO 30 ppm for now.  - Did not place RV impella with extensive bleeding post-TNKase and extensive clot.  2. PEA arrest: Suspect secondary  to massive PE.  Received 10+ minutes CPR. Head CT negative. Seems to be neurologically intact. No change. 3. Presumed massive PE: Has hx of PE in 2016, but has not been on chronic anticoagulation (off >1 year).  Lower extremity dopplers this admission positive for left popliteal acute DVT.  Received TNKase in ED.  - Continue heparin drip.  4. ID: WBCs 15.  PCT was > 150. Now 6  Concerned for HCAP, possibly in setting of aspiration (lower lobe infiltrates on CXR).  Cultures remain negative.  - On Zosyn and Vanc.  5. AKI on CKD: Baseline creatinine looks to be 1.6-2.2 range (cause of baseline CKD uncertain). He is now on CVVH with AKI, poor UOP, and volume overload. Renal US negative for hydronephrosis.  AKI due to shock.  - Continue CVVH, needs more volume off.  6. Anemia: Bleeding from OGT and urethra post-TNKase.  He has received multiple units PRBCs, hgb fairly stable at 7.7 today.  - Urology consulted. Supportive care for now, but recommended further imaging if possible and ?TURP in future.  7. Elevated liver enzymes: In setting of shock (ischemic hepatitis). Trend. No change. 8. Hypocalcemia: per nephrology. 9. Acute respiratory hypoxemic respiratory failure: Remains intubated, suspect due to combination of PNA and pulmonary edema. Remains  intubated. FiO2 40%. No change. 10. Thrombocytopenia: Plts back to normal range today.  Suspect fall in plts in setting of sepsis, critical illness/inflammation.  11. Rhabdomyolysis: CK starting to trend down. No role for IVF in setting of ESRD - Per primary. Continue CRRT 12. Ileus  CRITICAL CARE Performed by: Loralie Champagne  Total critical care time: 35 minutes  Critical care time was exclusive of separately billable procedures and treating other patients.  Critical care was necessary to treat or prevent imminent or life-threatening deterioration.  Critical care was time spent personally by me on the following activities: development of treatment plan with patient and/or surrogate as well as nursing, discussions with consultants, evaluation of patient's response to treatment, examination of patient, obtaining history from patient or surrogate, ordering and performing treatments and interventions, ordering and review of laboratory studies, ordering and review of radiographic studies, pulse oximetry and re-evaluation of patient's condition.   Length of Stay: 7  Loralie Champagne, MD  06/12/2018, 8:00 AM  Advanced Heart Failure Team Pager [phone] (M-F; 7a - 4p)  Please contact [facility] for night-coverage after hours (4p -7a ) and weekends on [email or web address]

## 2018-06-12 NOTE — Progress Notes (Signed)
Subjective:  Remains hemodynamically unstable overnight but overall better than previous , CRRT running well - only 95 of urine was recorded- removed 3700 thru CRRT  !     Objective Vital signs in last 24 hours: Vitals:   06/12/18 0500 06/12/18 0600 06/12/18 0700 06/12/18 0737  BP: (!) 123/57 (!) 110/54 (!) 97/55   Pulse: (!) 101 89 86   Resp: 18 13 14    Temp: 98.8 F (37.1 C) 99 F (37.2 C) 98.8 F (37.1 C)   TempSrc:      SpO2: (!) 71% 100% 100% 100%  Weight: 79 kg     Height:       Weight change: -1.9 kg  Intake/Output Summary (Last 24 hours) at 06/12/2018 5176 Last data filed at 06/12/2018 0700 Gross per 24 hour  Intake 2142.83 ml  Output 5811 ml  Net -3668.17 ml    Assessment/Plan: 72 year old BM with CKD- (1.5 to 2.0 in 2016, upon presentation was 2.5) and history of PE who presented with PEA arrest, presumed PE with hemodynamic instability and A on CRF  1.Renal-acute on chronic kidney disease.  Not sure of the etiology of his chronic kidney disease.  However, his acute is most likely secondary to his multisystem organ failure and prolonged hypotension requiring pressors.  He is oliguric now requiring  renal replacement therapy.  CRRT started 10/23.  Running well, no heparin,  all 4 k bags.  I do not think that rhabdo is the cause of his AKI- is probably from code and being in ICU for several days- there is not an effective way to treat rhabdo in an oliguric pt who is already volume overloaded- just need to let it reabsorb- is improved 2. Hypertension/volume  - massively volume overloaded, receiving lots of drips and therefore lots of volume.  CVP had been near 20, is better now.  they have permission to pull 50-100 per hour-exceeding 3.  Hyperkalemia-resolved, change to all 4 k bags 4. Anemia-situational and multifactorial.  Has been transfused this admission-supportive care, per CCM- transfuse as needed- will add ESA  5.  Metabolic acidosis- improved 6.  Hemodynamic  instability-multiple pressors with inability to wean.  Cardiogenic versus septic shock per cardiology and CCM- some progress 7. Gross hematuria-  Renal u/s normal (no hydro) on 10/20, - urology placed cath- achieving good drainage for now, just very little UOP    [facility]    Labs: Basic Metabolic Panel: Recent Labs  Lab 06/11/18 0306 06/11/18 1559 06/12/18 0421  NA 135 137 136  K 4.6 4.6 4.5  CL 105 108 105  CO2 24 26 24   GLUCOSE 120* 104* 88  BUN 17 15 14   CREATININE 2.19* 2.10* 2.10*  CALCIUM 6.7* 6.7* 7.1*  PHOS 3.5 3.2 2.5   Liver Function Tests: Recent Labs  Lab 06/06/18 0807 06/07/18 0812 06/08/18 0441  06/11/18 0306 06/11/18 1559 06/12/18 0421  AST 485* 271* 279*  --   --   --   --   ALT 266* 166* 145*  --   --   --   --   ALKPHOS 62 45 48  --   --   --   --   BILITOT 1.0 2.0* 2.3*  --   --   --   --   PROT <3.0* <3.0* 3.3*  --   --   --   --   ALBUMIN 1.5* 1.4* 1.4*   < > 1.6* 1.5* 1.6*   < > =  values in this interval not displayed.   Recent Labs  Lab 06/08/18 1114  LIPASE 23   No results for input(s): AMMONIA in the last 168 hours. CBC: Recent Labs  Lab 06/08/18 0441 06/09/18 0308  06/09/18 2125 06/10/18 0355 06/11/18 0306 06/12/18 0421  WBC 26.0* 21.9*  --  15.6* 15.1* 12.8* 15.4*  NEUTROABS 16.1*  --   --   --   --   --   --   HGB 8.0* 7.0*   < > 7.8* 7.6* 8.1* 7.7*  HCT 22.7* 19.9*   < > 22.7* 22.5* 25.3* 24.3*  MCV 81.4 80.2  --  83.8 84.9 89.7 92.4  PLT 56* 54*  --  50* 59* 88* 154   < > = values in this interval not displayed.   Cardiac Enzymes: Recent Labs  Lab 06/13/2018 0854 06/13/2018 1935 06/06/18 0124 06/06/18 0807 06/08/18 1114 06/10/18 0355 06/11/18 0306 06/12/18 0421  CKTOTAL  --   --   --   --  67,591* 34,857* 31,504* 24,255*  CKMB  --   --   --   --  135.4* 278.9* 245.1* 174.1*  TROPONINI 1.98* 24.95* 20.53* 11.98*  --   --   --   --    CBG: Recent Labs  Lab 06/11/18 1151 06/11/18 1553 06/11/18 1953  06/12/18 0015 06/12/18 0419  GLUCAP 95 89 95 89 87    Iron Studies: No results for input(s): IRON, TIBC, TRANSFERRIN, FERRITIN in the last 72 hours. Studies/Results: Dg Abd 1 View  Result Date: 06/11/2018 CLINICAL DATA:  Vomiting. EXAM: ABDOMEN - 1 VIEW COMPARISON:  None. FINDINGS: A right femoral line is in good position. The distal end of the NG tube is somewhat obscured by overlapping wires but appears to terminate in the distal stomach. No other acute abnormalities. IMPRESSION: Support apparatus as above.  No acute interval change noted. Electronically Signed   By: Dorise Bullion III M.D   On: 06/11/2018 12:21   Dg Chest Port 1 View  Result Date: 06/11/2018 CLINICAL DATA:  72 year old male with respiratory failure status post PEA arrest. EXAM: PORTABLE CHEST 1 VIEW COMPARISON:  06/10/2018 and earlier. FINDINGS: Portable AP semi upright view at 0500 hours. Stable ET tube tip at the level the clavicles. Stable enteric tube, side hole the level of the proximal stomach. Stable right IJ central line. Stable lung volumes. Mediastinal contours remain normal. Veiling opacity in both lungs with no pneumothorax. Upper lobe pulmonary vascularity is normal. Stable ventilation. Paucity bowel gas in the upper abdomen. IMPRESSION: 1.  Stable lines and tubes. 2. Continued stable ventilation with bilateral pleural effusions and lower lobe collapse or consolidation. Electronically Signed   By: Genevie Ann M.D.   On: 06/11/2018 07:42   Medications: Infusions: . sodium chloride Stopped (06/08/18 0557)  . sodium chloride Stopped (06/11/18 1937)  . DOBUTamine 10 mcg/kg/min (06/12/18 0700)  . epinephrine Stopped (06/11/18 2039)  . famotidine (PEPCID) IV 20 mg (06/11/18 1034)  . feeding supplement (VITAL AF 1.2 CAL) Stopped (06/10/18 1550)  . fentaNYL infusion INTRAVENOUS 250 mcg/hr (06/12/18 0700)  . heparin 1,150 Units/hr (06/12/18 0700)  . midazolam (VERSED) infusion 10 mg/hr (06/12/18 0700)  .  norepinephrine (LEVOPHED) Adult infusion 4 mcg/min (06/12/18 0700)  . piperacillin-tazobactam (ZOSYN)  IV 3.375 g (06/12/18 0517)  . dialysis replacement fluid (prismasate) 300 mL/hr at 06/12/18 0313  . dialysis replacement fluid (prismasate) 350 mL/hr at 06/12/18 0442  . prismasol BGK 4/2.5 1,500 mL/hr at 06/12/18 0701  .  sodium chloride    . sodium chloride    . vasopressin (PITRESSIN) infusion - *FOR SHOCK* 0.03 Units/min (06/12/18 0700)    Scheduled Medications: . chlorhexidine gluconate (MEDLINE KIT)  15 mL Mouth Rinse BID  . Chlorhexidine Gluconate Cloth  6 each Topical Q0600  . lidocaine  1 application Urethral Once  . lidocaine  1 application Urethral Once  . mouth rinse  15 mL Mouth Rinse 10 times per day  . metoCLOPramide (REGLAN) injection  5 mg Intravenous Q6H  . sodium chloride flush  10-40 mL Intracatheter Q12H  . THROMBI-PAD  1 each Topical Once    have reviewed scheduled and prn medications.  Physical Exam: General: sedated , intubated  Heart: RRR Lungs: CBS bilat Abdomen: distended Extremities: pitting edema Dialysis Access: right IJ temp cath placed 10/23    06/12/2018,8:12 AM  LOS: 7 days

## 2018-06-12 NOTE — Progress Notes (Signed)
ANTICOAGULATION CONSULT NOTE   Pharmacy Consult for heparin Indication: suspected pulmonary embolus and confirmed DVT  No Known Allergies  Patient Measurements: Height: 5\' 10"  (177.8 cm) Weight: 174 lb 2.6 oz (79 kg) IBW/kg (Calculated) : 73  Vital Signs: Temp: 98.8 F (37.1 C) (10/27 1430) Temp Source: Esophageal (10/27 1430) BP: 98/52 (10/27 1430) Pulse Rate: 87 (10/27 1430)  Labs: Recent Labs    06/10/18 0355  06/11/18 0306 06/11/18 1245 06/11/18 1559 06/12/18 0421  HGB 7.6*  --  8.1*  --   --  7.7*  HCT 22.5*  --  25.3*  --   --  24.3*  PLT 59*  --  88*  --   --  154  HEPARINUNFRC 0.29*   < > 0.23* 0.28*  --  0.35  CREATININE 2.90*   < > 2.19*  --  2.10* 2.10*  CKTOTAL 34,857*  --  31,504*  --   --  24,255*  CKMB 278.9*  --  245.1*  --   --  174.1*   < > = values in this interval not displayed.    Estimated Creatinine Clearance: 33.3 mL/min (A) (by C-G formula based on SCr of 2.1 mg/dL (H)).  Assessment: 72yo male with DVT and likely PE s/p PEA arrest and TNKase 10/20 for heparin. Currently on CRRT with minimal UOP.   Gross hematuria now resolved and anemia s/p pRBC 10/25.  Hemoglobin stable at 7.7, pltc improved to 154. Heparin level at goal this morning.   Goal of Therapy:  Heparin level 0.3-0.5 units/ml Monitor platelets by anticoagulation protocol: Yes   Plan:  Continue Heparin at 1150 units/hr Daily heparin level and cbc  Sheppard Coil PharmD., BCPS Clinical Pharmacist 06/12/2018 3:07 PM

## 2018-06-13 ENCOUNTER — Encounter (HOSPITAL_COMMUNITY): Payer: Self-pay | Admitting: *Deleted

## 2018-06-13 ENCOUNTER — Other Ambulatory Visit: Payer: Self-pay

## 2018-06-13 ENCOUNTER — Inpatient Hospital Stay (HOSPITAL_COMMUNITY): Payer: Medicare Other

## 2018-06-13 DIAGNOSIS — L8990 Pressure ulcer of unspecified site, unspecified stage: Secondary | ICD-10-CM

## 2018-06-13 LAB — BPAM RBC
BLOOD PRODUCT EXPIRATION DATE: 201911082359
Blood Product Expiration Date: 201911142359
Blood Product Expiration Date: 201911142359
Blood Product Expiration Date: 201911182359
ISSUE DATE / TIME: 201910240513
ISSUE DATE / TIME: 201910241250
ISSUE DATE / TIME: 201910251113
ISSUE DATE / TIME: 201910271416
UNIT TYPE AND RH: 6200
UNIT TYPE AND RH: 6200
Unit Type and Rh: 6200
Unit Type and Rh: 6200

## 2018-06-13 LAB — CALCIUM, IONIZED: Calcium, Ionized, Serum: <3 mg/dL — ABNORMAL LOW (ref 4.5–5.6)

## 2018-06-13 LAB — TYPE AND SCREEN
ABO/RH(D): A POS
ANTIBODY SCREEN: NEGATIVE
UNIT DIVISION: 0
UNIT DIVISION: 0
Unit division: 0
Unit division: 0

## 2018-06-13 LAB — GLUCOSE, CAPILLARY
GLUCOSE-CAPILLARY: 81 mg/dL (ref 70–99)
GLUCOSE-CAPILLARY: 88 mg/dL (ref 70–99)
GLUCOSE-CAPILLARY: 115 mg/dL — AB (ref 70–99)
Glucose-Capillary: 100 mg/dL — ABNORMAL HIGH (ref 70–99)
Glucose-Capillary: 92 mg/dL (ref 70–99)
Glucose-Capillary: 95 mg/dL (ref 70–99)
Glucose-Capillary: 96 mg/dL (ref 70–99)

## 2018-06-13 LAB — RENAL FUNCTION PANEL
ALBUMIN: 1.7 g/dL — AB (ref 3.5–5.0)
ANION GAP: 8 (ref 5–15)
ANION GAP: 9 (ref 5–15)
Albumin: 1.7 g/dL — ABNORMAL LOW (ref 3.5–5.0)
BUN: 14 mg/dL (ref 8–23)
BUN: 14 mg/dL (ref 8–23)
CO2: 24 mmol/L (ref 22–32)
CO2: 23 mmol/L (ref 22–32)
Calcium: 7.7 mg/dL — ABNORMAL LOW (ref 8.9–10.3)
Calcium: 7.7 mg/dL — ABNORMAL LOW (ref 8.9–10.3)
Chloride: 105 mmol/L (ref 98–111)
Chloride: 103 mmol/L (ref 98–111)
Creatinine, Ser: 2.03 mg/dL — ABNORMAL HIGH (ref 0.61–1.24)
Creatinine, Ser: 1.95 mg/dL — ABNORMAL HIGH (ref 0.61–1.24)
GFR calc Af Amer: 36 mL/min — ABNORMAL LOW (ref >60)
GFR calc Af Amer: 38 mL/min — ABNORMAL LOW (ref >60)
GFR calc non Af Amer: 33 mL/min — ABNORMAL LOW (ref >60)
GFR, EST NON AFRICAN AMERICAN: 31 mL/min — AB (ref >60)
GLUCOSE: 127 mg/dL — AB (ref 70–99)
Glucose, Bld: 103 mg/dL — ABNORMAL HIGH (ref 70–99)
PHOSPHORUS: 2.5 mg/dL (ref 2.5–4.6)
POTASSIUM: 4.5 mmol/L (ref 3.5–5.1)
POTASSIUM: 4 mmol/L (ref 3.5–5.1)
Phosphorus: 1.4 mg/dL — ABNORMAL LOW (ref 2.5–4.6)
Sodium: 137 mmol/L (ref 135–145)
Sodium: 135 mmol/L (ref 135–145)

## 2018-06-13 LAB — CK: Total CK: 19454 U/L — ABNORMAL HIGH (ref 49–397)

## 2018-06-13 LAB — CBC
HEMATOCRIT: 30.7 % — AB (ref 39.0–52.0)
HEMOGLOBIN: 9.4 g/dL — AB (ref 13.0–17.0)
MCH: 28.2 pg (ref 26.0–34.0)
MCHC: 30.6 g/dL (ref 30.0–36.0)
MCV: 92.2 fL (ref 80.0–100.0)
NRBC: 0.2 % (ref 0.0–0.2)
Platelets: 231 10*3/uL (ref 150–400)
RBC: 3.33 MIL/uL — ABNORMAL LOW (ref 4.22–5.81)
RDW: 18 % — AB (ref 11.5–15.5)
WBC: 17.3 10*3/uL — AB (ref 4.0–10.5)

## 2018-06-13 LAB — MAGNESIUM: MAGNESIUM: 2.5 mg/dL — AB (ref 1.7–2.4)

## 2018-06-13 LAB — COOXEMETRY PANEL
CARBOXYHEMOGLOBIN: 1.7 % — AB (ref 0.5–1.5)
Methemoglobin: 2 % — ABNORMAL HIGH (ref 0.0–1.5)
O2 SAT: 75.8 %
Total hemoglobin: 10.1 g/dL — ABNORMAL LOW (ref 12.0–16.0)

## 2018-06-13 LAB — HEPARIN LEVEL (UNFRACTIONATED)
Heparin Unfractionated: 0.31 IU/mL (ref 0.30–0.70)
Heparin Unfractionated: 0.2 IU/mL — ABNORMAL LOW (ref 0.30–0.70)

## 2018-06-13 MED ORDER — VITAL AF 1.2 CAL PO LIQD: 1000.0000 mL | ORAL | Status: DC

## 2018-06-13 MED ORDER — SODIUM CHLORIDE 0.9 % IV SOLN
INTRAVENOUS | Status: DC
  Administered 2018-06-14 – 2018-06-27 (×3): via INTRAVENOUS

## 2018-06-13 MED ORDER — DEXMEDETOMIDINE HCL IN NACL 400 MCG/100ML IV SOLN
0.4000 ug/kg/h | INTRAVENOUS | Status: DC
  Administered 2018-06-13: 0.4 ug/kg/h via INTRAVENOUS
  Filled 2018-06-13: qty 100

## 2018-06-13 MED ORDER — WHITE PETROLATUM EX OINT
TOPICAL_OINTMENT | CUTANEOUS | Status: DC | PRN
  Filled 2018-06-13: qty 28.35

## 2018-06-13 MED ORDER — VITAL AF 1.2 CAL PO LIQD
1000.0000 mL | ORAL | Status: DC
  Administered 2018-06-13: 20 mL

## 2018-06-13 NOTE — Plan of Care (Signed)
Wasted 40 cc versed in sink witnessed by Aviva Signs RN

## 2018-06-13 NOTE — Plan of Care (Signed)
  Problem: Nutrition: Goal: Adequate nutrition will be maintained Outcome: Progressing   

## 2018-06-13 NOTE — Progress Notes (Signed)
I completed a follow-up visit with the patient to provide spiritual support as needed. The patient was resting and the nurse was present in the room. No family members were present or available. I offered prayers for the patient and will try to be available when family members are present.    06/13/18 1100  Clinical Encounter Type  Visited With Patient  Visit Type Follow-up;Spiritual support  Referral From Nurse  Consult/Referral To Chaplain  Stress Factors  Patient Stress Factors None identified    Chaplain Dr Melvyn Novas

## 2018-06-13 NOTE — Consult Note (Addendum)
WOC note: Pt was noted to have bilat deep tissue pressure injuries to heels on the nursing flowsheet.  Prevalon boots ordered to reduce pressure.  WOC team will assess locations weekly. Pressure injury POA: No Cammie Mcgee MSN, RN, [place], [place], CNS [phone]

## 2018-06-13 NOTE — Progress Notes (Signed)
[facility] Progress Note    Assessment/ Plan:   72 year old BM with CKD- (1.5 to 2.0 in 2016, upon presentation was 2.5) and history of PE who presented with PEA arrest, presumed PE with hemodynamic instability and A on CRF 1.Renal-acute on chronic kidney disease. Not sure of the etiology of his chronic kidney disease. However, his acute is most likely secondary to his multisystem organ failure and prolonged hypotension requiring pressors. He is oliguric now requiring  renal replacement therapy. CRRT started 10/23. Running well, no heparin,  all 4 k bags.  I do not think that rhabdo is the cause of his AKI- is probably from code and being in ICU for several days- there is not an effective way to treat rhabdo in an oliguric pt who is already volume overloaded- just need to let it reabsorb  - continues to req RRT and fortunately tolerating CRRT - continue UF at current rate with e/o vol overload and likely in the next 48-72 hrs will decrease rate  2. Hypertension/volume- massively volume overloaded,receiving lots of drips and therefore lots of volume. CVP had been near 20, is better now. they have permission to pull 50-100 per hour-exceeding 3.Hyperkalemia-resolved, continue  4 k bags 4. Anemia-situational and multifactorial. Has been transfused this admission-supportive care, per CCM- transfuse as needed- will add ESA  5.Metabolic acidosis- improved 6.Hemodynamic instability-multiple pressors with inability to wean. Cardiogenic versus septic shock per cardiology and CCM- some progress 7. Gross hematuria-  Renal u/s normal (no hydro) on 10/20, - urology placed cath- achieving good drainage for now, just very little UOP, nurse irrigating  Subjective:   On levo and vaso. Tolerating CRRT  Seen on CRRT 4K bath Tolerating 125-150 ml/hr and still has e/o excessive volume onboard RIJ temp cath Pre/post/qd 300/350/1500 with FF of 10%   Objective:   BP (!) 90/54    Pulse 88   Temp 98.6 F (37 C)   Resp 14   Ht 5' 10"  (1.778 m)   Wt 75.5 kg   SpO2 97%   BMI 23.88 kg/m   Intake/Output Summary (Last 24 hours) at 06/13/2018 0747 Last data filed at 06/13/2018 0700 Gross per 24 hour  Intake 2585.29 ml  Output 6324 ml  Net -3738.71 ml   Weight change: -3.5 kg  Physical Exam: General: sedated , intubated  Heart: RRR Lungs: CBS bilat Abdomen: distended, no BS Extremities: pitting edema up to thighs Dialysis Access: right IJ temp cath placed 10/23   Imaging: Dg Abd 1 View  Result Date: 06/11/2018 CLINICAL DATA:  Vomiting. EXAM: ABDOMEN - 1 VIEW COMPARISON:  None. FINDINGS: A right femoral line is in good position. The distal end of the NG tube is somewhat obscured by overlapping wires but appears to terminate in the distal stomach. No other acute abnormalities. IMPRESSION: Support apparatus as above.  No acute interval change noted. Electronically Signed   By: Dorise Bullion III M.D   On: 06/11/2018 12:21   Dg Chest Port 1 View  Result Date: 06/12/2018 CLINICAL DATA:  Intubated patient.  Follow-up exam. EXAM: PORTABLE CHEST 1 VIEW COMPARISON:  06/11/2018 and multiple prior studies. FINDINGS: Endotracheal tube tip projects 6.8 cm above the carinal. Nasal/orogastric tube passes below the diaphragm into the stomach. Right internal jugular central venous line tip projects in the mid to upper superior vena cava. Support apparatus is stable. There is opacity at the right lung base obscuring hemidiaphragm consistent moderate pleural effusion with atelectasis. Milder atelectasis is noted at  the left lung base. Allowing for the more erect positioning on the current exam, there has been no significant interval change. No pneumothorax. IMPRESSION: 1. No significant change from the most recent prior study. 2. Support apparatus is stable. 3. Moderate right pleural effusion. By basilar atelectasis. No convincing pulmonary edema. Electronically Signed   By:  Lajean Manes M.D.   On: 06/12/2018 10:03    Labs: BMET Recent Labs  Lab 06/10/18 0355 06/10/18 1628 06/11/18 0306 06/11/18 1559 06/12/18 0421 06/12/18 1610 06/13/18 0411  NA 136 135 135 137 136 137 137  K 4.1 4.5 4.6 4.6 4.5 4.5 4.5  CL 104 105 105 108 105 105 105  CO2 24 25 24 26 24 25 24   GLUCOSE 150* 143* 120* 104* 88 101* 103*  BUN 22 17 17 15 14 13 14   CREATININE 2.90* 2.35* 2.19* 2.10* 2.10* 2.05* 2.03*  CALCIUM 6.3* 6.5* 6.7* 6.7* 7.1* 7.3* 7.7*  PHOS 4.7* 3.9 3.5 3.2 2.5 2.4* 2.5   CBC Recent Labs  Lab 06/08/18 0441  06/10/18 0355 06/11/18 0306 06/12/18 0421 06/13/18 0411  WBC 26.0*   < > 15.1* 12.8* 15.4* 17.3*  NEUTROABS 16.1*  --   --   --   --   --   HGB 8.0*   < > 7.6* 8.1* 7.7* 9.4*  HCT 22.7*   < > 22.5* 25.3* 24.3* 30.7*  MCV 81.4   < > 84.9 89.7 92.4 92.2  PLT 56*   < > 59* 88* 154 231   < > = values in this interval not displayed.    Medications:    . chlorhexidine gluconate (MEDLINE KIT)  15 mL Mouth Rinse BID  . Chlorhexidine Gluconate Cloth  6 each Topical Q0600  . darbepoetin (ARANESP) injection - NON-DIALYSIS  200 mcg Subcutaneous Q Sun-1800  . feeding supplement (VITAL AF 1.2 CAL)  1,000 mL Per Tube Q24H  . lidocaine  1 application Urethral Once  . lidocaine  1 application Urethral Once  . mouth rinse  15 mL Mouth Rinse 10 times per day  . metoCLOPramide (REGLAN) injection  5 mg Intravenous Q6H  . sodium chloride flush  10-40 mL Intracatheter Q12H  . THROMBI-PAD  1 each Topical Once      Otelia Santee, MD 06/13/2018, 7:47 AM

## 2018-06-13 NOTE — Progress Notes (Signed)
Dr. Pecola Leisure with Urology paged to clarify irrigating coude cath. MD asked if urine was still bloody this RN advised yes. MD states to irrigate coude cath once a shift 200-310ml of fluid. Will continue to monitor.

## 2018-06-13 NOTE — Progress Notes (Signed)
Coude catheter irrigated with of sterile water, dark red blood came out, no clots noted. Irrigated until it turned light pink. Peri and foley care completed.

## 2018-06-13 NOTE — Progress Notes (Signed)
NAME:  Bryan Parker, MRN:  [id number], DOB:  [birth year], LOS: 8 ADMISSION DATE:  05/27/2018, CONSULTATION DATE:  05/28/2018 REFERRING MD:  Dr. Rhunette Croft , CHIEF COMPLAINT:  Cardiac Arrest    Brief History   72 year old male presents to ED s/p Cardiac Arrest. Per Report patient called EMS for Abdominal Pain/Nausea/Vomiting. When EMS arrived patient was alert however went pulseless and PEA while on scene. ROSC achieved after 2 EPI and 10 minutes CPR. On arrival patient continued to be unstable and required multiple rounds of CPR. Fast exam negative. Bedside ECHO revealed slightly dilated RV. ABG 6.914/69.5/368. Progressive hypotension requiring Levophed/EPI/Vasopresin and 9 Bicarb pushes.    Wife reports that patient was complaining of severe left calf pain all day 10/19. Has a history of PE 2016. Was taken off anticoagulation 2018. Due to high suspicion of PE TNKase was given in ED. PCCM asked to admit.   Past Medical History  H/O DVT/PE, CKD  Significant Hospital Events   10/20 > Presents to ED  1021 with a partial code no CPR 10/22 bleeding from IV sites and foley, heparin d/c'd for 2 hours then resumed. Hgb 6.8 > 1u PRBC transfused 10/23 CRRT started.  Giapreza added. 10/24 coude catheter placed by urology and CBI started. 10/25 - Pressor requirements much improved after giaprezza added 10/23; however, remains on vaso0.03, NE 23, epi 10, dobut 20, giapreza 25. CK up from 14.5 on 10/24 up to 35K on 10/25.  CRRT running.  10/26 - CK 31,000 (was 34,000 yesterday) . Volume removal continues. Fluid challenge strategy cancelled. CK better. Legs thinner with volume removal. Pressors: off giaprezza, levophed 6, dobutamine 10, epi 16 and vaso 0.03. Had vomit yesterday and TF on hold. Has bloody urine - seen by urology  10/27  - pressor needs coming down - off epi gtt. Dobutamine 10, Vaso 0.03 and levophed 4-54mcg/min. . Still largely volume . Volume removal via CRRT. CK down to 24K. TF still on hold.      Consults: date of consult/date signed off & final recs:  PCCM 10/20 Heart failure 10/21 >  Urology 10/24 >   Procedures (surgical and bedside):  ETT 10/20 >> Right Femoral CVC 10/20 >> Left Femoral Aline 10/20 >>  L IJ HD cath 10/23 >   Significant Diagnostic Tests:  CT Head 10/20 >> neg Echo 10/20 > EF 55 - 60%, severely dilated RV, mod dilated RA, mod TR.  Micro Data:  Blood 10/20 >> neg Sputum 10/20 >> neg U/A 10/20 >> neg  Antimicrobials:  zosyn 10/20 >> vanc 10/20 >>off  SUBJECTIVE/OVERNIGHT/INTERVAL HX   Critically ill , afebrile , on CRRT Intubated, on versed/fent gtt   Objective   Blood pressure 115/70, pulse (!) 115, temperature (!) 95.7 F (35.4 C), resp. rate (!) 27, height 5\' 10"  (1.778 m), weight 75.5 kg, SpO2 98 %. CVP:  [7 mmHg-11 mmHg] 10 mmHg  Vent Mode: PSV;CPAP FiO2 (%):  [40 %] 40 % Set Rate:  [14 bmp] 14 bmp Vt Set:  [500 mL] 500 mL PEEP:  [5 cmH20] 5 cmH20 Pressure Support:  [5 cmH20] 5 cmH20 Plateau Pressure:  [15 cmH20-18 cmH20] 18 cmH20   Intake/Output Summary (Last 24 hours) at 06/13/2018 0840 Last data filed at 06/13/2018 0800 Gross per 24 hour  Intake 2542.77 ml  Output 6326 ml  Net -3783.23 ml   Filed Weights   06/11/18 0600 06/12/18 0500 06/13/18 0424  Weight: 80.9 kg 79 kg 75.5 kg    Acutely  ill, sedated on Versed and fentanyl RA SS 0 to -1, moves all 4 extremities Cool fingers and toes with mild darkening of some toes good radial pulses Soft nontender abdomen Bloody urine Decreased breath sounds bilateral, S1-S2 tacky  Chest x-ray personally reviewed by me shows improved right effusion, hardware in position       Assessment & Plan:   #acute resp failure - s/p intubation in ED.   PLAN -Start spontaneous breathing trials, pulse good tidal volumes but extubation limited by encephalopathy and deconditioning and shock  #PEA Cardiac Arrest - concern due to massive PE.   Shock - likely mixed, obstructive due  to above and likely septic with unclear etiology given PCT > 150. -Coming off pressors  plan -Levophed for SBP more than 90 or MAP > 65 - Cardiology following.  #Concern for PE - s/p TPA in ED. LLE DVT. Hx PE / DVT 2016 (no longer on anticoagulation).  Plan - Continue heparin gtt. - Monitor CBC and bleeding left Foley, continue to irrigate Foley  #Septic shock - no clear source.  PCT > 150. - Continue zosyn x 10 ds total or until pct < 0.5  & dc vanc. - CT chest & abdomen at some point when stable  #AKI on Chronic Kidney Failure - likely exacerbated by arrest and rhabdo.  Now on CRRT. #Rhabdomyolysis - CK up from 14.5K on 10/24 to 35K on 10/25 despite CRRT now down to [facility]  - Nephrology following, appreciate the assistance. - Correct electrolytes as indicated. - Follow CK daily but no way to decrease this other than HD  - consider CT abd for RP hemorrhage when stable   #Acute blood loss anemia - had bleeding from IV sites and foley - about 5-6 U PRBC so far  PLAN - Maintain Hgb > 8 following PEA arrest. - at some point change hgb  Goal > 7gm%  Urethral bleeding - likely traumatic following tPA administration. - Urology following, if pt survives then will eventually need CT scan and cystoscopy. - getting daily irrigation by RRN  #Thrombocytopenia.-  resolved   #Hyperglycemia. - Continue SSI.  #Acute Encephalopathy - metabolic + from PEA arrest. - wean versed to off, add precedex, goal RASS 0 to -1 - consider seroquel if does not tolerate precedex hemodynamically  #Vomit 10/25/19a nd TF on hold. KUB - no ileus PLAN  -restart TF at trickle and titrate   Disposition / Summary of Today's Plan 06/13/18    Full ICU care  Code Status: 06/07/2018 partial code no CPR Family Communication: updated wife at bedside  For today, try to wean Levophed to off, wean off Versed and add Precedex if needed and see if the tolerates hemodynamically, start spontaneous breathing  trials, pursue CT chest and abdomen without contrast, continue CRRT   ATTESTATION & SIGNATURE   The patient is critically ill with multiple organ systems failure and requires high complexity decision making for assessment and support, frequent evaluation and titration of therapies, application of advanced monitoring technologies and extensive interpretation of multiple databases. Critical Care Time devoted to patient care services described in this note independent of APP/resident  time is 35 minutes.   Cyril Mourning MD. Tonny Bollman. [facility] Pager [phone] If no response call 319 [phone]   06/13/2018

## 2018-06-13 NOTE — Progress Notes (Signed)
ANTICOAGULATION CONSULT NOTE   Pharmacy Consult for heparin Indication: suspected pulmonary embolus and confirmed DVT  No Known Allergies  Patient Measurements: Height: 5\' 10"  (177.8 cm) Weight: 166 lb 7.2 oz (75.5 kg) IBW/kg (Calculated) : 73  Vital Signs: Temp: 97.2 F (36.2 C) (10/28 1145) BP: 96/62 (10/28 1144) Pulse Rate: 77 (10/28 1145)  Labs: Recent Labs    06/11/18 0306 06/11/18 1245  06/12/18 0421 06/12/18 1610 06/13/18 0411  HGB 8.1*  --   --  7.7*  --  9.4*  HCT 25.3*  --   --  24.3*  --  30.7*  PLT 88*  --   --  154  --  231  HEPARINUNFRC 0.23* 0.28*  --  0.35  --  0.31  CREATININE 2.19*  --    < > 2.10* 2.05* 2.03*  CKTOTAL 31,504*  --   --  24,255*  --  19,454*  CKMB 245.1*  --   --  174.1*  --   --    < > = values in this interval not displayed.    Estimated Creatinine Clearance: 34.5 mL/min (A) (by C-G formula based on SCr of 2.03 mg/dL (H)).  Assessment: 72yo male with DVT and likely PE s/p PEA arrest and TNKase 10/20 for heparin. Currently on CRRT with minimal UOP.   Gross hematuria now resolved and anemia s/p pRBC 10/25.  Hemoglobin stable at 9.4, pltc improved to 231. Heparin level at goal this morning at 0.31.   Goal of Therapy:  Heparin level 0.3-0.5 units/ml Monitor platelets by anticoagulation protocol: Yes   Plan:  Continue Heparin at 1150 units/hr Recheck level to ensure doesn't fall subtherapeutic  Daily heparin level and cbc  Marcelino Freestone, PharmD PGY2 Cardiology Pharmacy Resident Phone [phone] Please check AMION for all Pharmacist numbers by unit 06/13/2018 12:03 PM

## 2018-06-13 NOTE — Progress Notes (Signed)
Pt HR 40-50's. BP dropped. Dr. Vassie Loll made aware. New orders obtained. Precedex gtt off and prn versed ordered. Will continue to monitor.

## 2018-06-13 NOTE — Progress Notes (Signed)
Irrigated coude cath with of fluid. Urine dark red at beginning of irrigation, urine pink tinged at end. Will continue to monitor pt.

## 2018-06-13 NOTE — Progress Notes (Addendum)
Pt placed back on full vent support due to low RR.  RN notified. 

## 2018-06-13 NOTE — Progress Notes (Signed)
ANTICOAGULATION CONSULT NOTE   Pharmacy Consult for heparin Indication: suspected pulmonary embolus and confirmed DVT  No Known Allergies  Patient Measurements: Height: 5\' 10"  (177.8 cm) Weight: 166 lb 7.2 oz (75.5 kg) IBW/kg (Calculated) : 73  Vital Signs: Temp: 98.6 F (37 C) (10/28 1400) BP: 98/58 (10/28 1400) Pulse Rate: 87 (10/28 1400)  Labs: Recent Labs    06/11/18 0306  06/12/18 0421 06/12/18 1610 06/13/18 0411 06/13/18 1310  HGB 8.1*  --  7.7*  --  9.4*  --   HCT 25.3*  --  24.3*  --  30.7*  --   PLT 88*  --  154  --  231  --   HEPARINUNFRC 0.23*   < > 0.35  --  0.31 0.20*  CREATININE 2.19*   < > 2.10* 2.05* 2.03*  --   CKTOTAL 31,504*  --  24,255*  --  19,454*  --   CKMB 245.1*  --  174.1*  --   --   --    < > = values in this interval not displayed.    Estimated Creatinine Clearance: 34.5 mL/min (A) (by C-G formula based on SCr of 2.03 mg/dL (H)).  Assessment: 72yo male with DVT and likely PE s/p PEA arrest and TNKase 10/20 for heparin. Currently on CRRT with minimal UOP.   Gross hematuria now resolved and anemia s/p pRBC 10/25.  Hemoglobin stable at 9.4, pltc improved to 231. Heparin level 0.2 < goal on heparin drip 1150uts/hr.    Goal of Therapy:  Heparin level 0.3-0.5 units/ml Monitor platelets by anticoagulation protocol: Yes   Plan:  Increase Heparin  1250 units/hr Daily heparin level and cbc  Leota Sauers Pharm.D. CPP, BCPS Clinical Pharmacist [phone] 06/13/2018 3:04 PM

## 2018-06-13 NOTE — Progress Notes (Addendum)
Patient ID: Bryan Parker, male   DOB: Jun 27, 1946, 72 y.o.   MRN: [id number]     Advanced Heart Failure Rounding Note  PCP-Cardiologist: Fransico Him, MD   Subjective:    CK 14,417 > 34,857 > 31,504 > 24,255 > 19454   Off vaso, on norepi 2, and dobutamine 10 mcg. CO-OX 75%. On CVVHD. Weight down another 8 pounds.   WBC 15>17. On zosyn.   Remains intubated.    Objective:   Weight Range: 75.5 kg Body mass index is 23.88 kg/m.   Vital Signs:   Temp:  [95.7 F (35.4 C)-99.1 F (37.3 C)] 97.9 F (36.6 C) (10/28 0845) Pulse Rate:  [83-117] 104 (10/28 0845) Resp:  [12-27] 18 (10/28 0845) BP: (89-153)/(41-85) 115/70 (10/28 0825) SpO2:  [96 %-100 %] 97 % (10/28 0845) Arterial Line BP: (100-160)/(39-70) 137/59 (10/28 0845) FiO2 (%):  [40 %] 40 % (10/28 0838) Weight:  [75.5 kg] 75.5 kg (10/28 0424) Last BM Date: 06/13/18  Weight change: Filed Weights   06/11/18 0600 06/12/18 0500 06/13/18 0424  Weight: 80.9 kg 79 kg 75.5 kg    Intake/Output:   Intake/Output Summary (Last 24 hours) at 06/13/2018 0847 Last data filed at 06/13/2018 0800 Gross per 24 hour  Intake 2542.77 ml  Output 6326 ml  Net -3783.23 ml      Physical Exam   CVP 10  General: Intubated.  HEENT: ETT  Neck: supple. JVD 9-10. Carotids 2+ bilat; no bruits. No lymphadenopathy or thryomegaly appreciated. Cor: PMI nondisplaced. Tachy regular rate & rhythm. No rubs, gallops or murmurs. Lungs: clear Abdomen: soft, nontender, nondistended. No hepatosplenomegaly. No bruits or masses. Good bowel sounds. Extremities: no cyanosis, clubbing, rash, edema Neuro: on vent . Opens eyes.    Telemetry   Sinus Tach 120s   EKG    No new tracings.   Labs    CBC Recent Labs    06/12/18 0421 06/13/18 0411  WBC 15.4* 17.3*  HGB 7.7* 9.4*  HCT 24.3* 30.7*  MCV 92.4 92.2  PLT 154 817   Basic Metabolic Panel Recent Labs    06/12/18 0421 06/12/18 1610 06/13/18 0411  NA 136 137 137  K 4.5 4.5 4.5  CL  105 105 105  CO2 _0 GLUCOSE 88 101* 103*  BUN _1 CREATININE 2.10* 2.05* 2.03*  CALCIUM 7.1* 7.3* 7.7*  MG 2.3  --  2.5*  PHOS 2.5 2.4* 2.5   Liver Function Tests Recent Labs    06/12/18 1610 06/13/18 0411  ALBUMIN 1.7* 1.7*   No results for input(s): LIPASE, AMYLASE in the last 72 hours. Cardiac Enzymes Recent Labs    06/11/18 0306 06/12/18 0421 06/13/18 0411  CKTOTAL 31,504* 24,255* 19,454*  CKMB 245.1* 174.1*  --     BNP: BNP (last 3 results) Recent Labs    05/29/2018 0854  BNP 18.0    ProBNP (last 3 results) No results for input(s): PROBNP in the last 8760 hours.   D-Dimer No results for input(s): DDIMER in the last 72 hours. Hemoglobin A1C No results for input(s): HGBA1C in the last 72 hours. Fasting Lipid Panel No results for input(s): CHOL, HDL, LDLCALC, TRIG, CHOLHDL, LDLDIRECT in the last 72 hours. Thyroid Function Tests No results for input(s): TSH, T4TOTAL, T3FREE, THYROIDAB in the last 72 hours.  Invalid input(s): FREET3  Other results:   Imaging    No results found.   Medications:     Scheduled Medications: . chlorhexidine gluconate (MEDLINE KIT)  15 mL Mouth Rinse BID  . Chlorhexidine Gluconate Cloth  6 each Topical Q0600  . darbepoetin (ARANESP) injection - NON-DIALYSIS  200 mcg Subcutaneous Q Sun-1800  . [START ON 06/14/2018] feeding supplement (VITAL AF 1.2 CAL)  1,000 mL Per Tube Q24H  . lidocaine  1 application Urethral Once  . lidocaine  1 application Urethral Once  . mouth rinse  15 mL Mouth Rinse 10 times per day  . metoCLOPramide (REGLAN) injection  5 mg Intravenous Q6H  . sodium chloride flush  10-40 mL Intracatheter Q12H  . THROMBI-PAD  1 each Topical Once    Infusions: . sodium chloride Stopped (06/08/18 0557)  . sodium chloride Stopped (06/11/18 1937)  . dexmedetomidine (PRECEDEX) IV infusion    . DOBUTamine 10 mcg/kg/min (06/13/18 0800)  . famotidine (PEPCID) IV 20 mg (06/12/18 0925)  . fentaNYL  infusion INTRAVENOUS 125 mcg/hr (06/13/18 0800)  . heparin 1,150 Units/hr (06/13/18 0800)  . midazolam (VERSED) infusion 5 mg/hr (06/13/18 0800)  . norepinephrine (LEVOPHED) Adult infusion 4 mcg/min (06/13/18 0846)  . piperacillin-tazobactam (ZOSYN)  IV 3.375 g (06/13/18 0547)  . dialysis replacement fluid (prismasate) 300 mL/hr at 06/12/18 2018  . dialysis replacement fluid (prismasate) 350 mL/hr at 06/12/18 1924  . prismasol BGK 4/2.5 1,500 mL/hr at 06/13/18 0631  . sodium chloride    . sodium chloride      PRN Medications: sodium chloride, fentaNYL (SUBLIMAZE) injection, heparin, midazolam, sodium chloride, sodium chloride flush    Patient Profile   Bryan Parker is a 72 y.o. 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  Presented to [facility] after PEA arrest with 10+ minutes down, presumed large PE. Now with RV failure.  Assessment/Plan   1. Shock: Mixed cardiogenic/septic. 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 Also septic shock with very high PCT, fever, and presumed PNA.  Echo 10/2014 showed EF 60%, RV normal, PA peak pressure 73 mmHg.  Echo 05/18/2018 showed LV EF 55-60%, RV severely dilated and dysfunctional, moderate TR, moderately dilated RA, PA peak pressure 31 mmHg.   - CO-OX 75%. Continue CVVH for volume removal,with weight down another 8 lbs.  - Continue to wean pressor support slowly as tolerated.  - Cut back Dobutamine to 6 mcg and wean norepinephrine as tolerated.   - Continue iNO 30 ppm for now.  - Did not place RV impella with extensive bleeding post-TNKase and extensive clot.  2. PEA arrest: Suspect secondary to massive PE.  Received 10+ minutes CPR. Head CT negative. Seems to be neurologically intact. No change. 3. Presumed massive PE: Has hx of PE in 2016, but has not been on chronic anticoagulation (off >1 year).  Lower extremity dopplers this admission positive for left popliteal acute  DVT.  Received TNKase in ED.  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  - Continue heparin drip.  4. ID: WBCs 17.  PCT was > 150. Last procalcitonin was 75. Concerned for HCAP, possibly in setting of aspiration (lower lobe infiltrates on CXR).  Cultures remain negative.  WBC 15>17.  - Off vancomycin, continues on Zosyn.  5. AKI on CKD: Baseline creatinine looks to be 1.6-2.2 range (cause of baseline CKD uncertain).  Remains on CVVH, UF 150 cc/hr.  Renal US negative for hydronephrosis.  AKI due to shock with likely contribution from rhabdomyolysis.  - Continue CVVH, needs more volume off.  6. Anemia:  Profuse bleeding from OGT and urethra post-TNKase.  He has received multiple units PRBCs, hgb up 9.4.  - Urology consulted. Supportive care for now, but recommended further imaging if possible and ?TURP in future.  7. Elevated liver enzymes: In setting of shock (ischemic hepatitis). Trend. No change. 8. Hypocalcemia: per nephrology. 9. Acute respiratory hypoxemic respiratory failure: Remains intubated, suspect due to combination of PNA and pulmonary edema. Remains intubated. FiO2 40%. No change. 10. Thrombocytopenia: Platelets normal.  Suspect fall in plts in setting of sepsis, critical illness/inflammation.  11. Rhabdomyolysis: CK continues to trend down. No role for IVF in setting of ESRD - Per primary. Continue CRRT 12. Ileus: Resolving, TFs going in.   Length of Stay: [place], NP  06/13/2018, 8:47 AM  Advanced Heart Failure Team Pager [phone] (M-F; 7a - 4p)  Please contact [facility] for night-coverage after hours (4p -7a ) and weekends on [email or web address]  Patient seen with NP, agree with the above note.    Hemodynamics are improving.  Co-ox 75% today with CVP 10.  He is currently on norepinephrine 5 and dobutamine 10.    On exam, JVP 10 cm, 2+ edema to thighs.  Decreased BS at lung bases.  Regular S1S2.    Today, continue to wean down on NE.  Suspect we will be able to stop it.  Decrease dobutamine to 6.  He remains on NO 30 ppm.  Should be able to wean this soon as well.    Continue CVVH with anasarca, drawing off 150 cc/hr UF.  CK trending down.  Suspect both rhabdomyolysis and shock played a role in his AKI, I am concerned that this may not recover.   Hemoglobin stable.   Concern for HCAP, off vancomycin and remains on Zosyn.   CRITICAL CARE Performed by: Loralie Champagne  Total critical care time: 35 minutes  Critical care time was exclusive of separately billable procedures and treating other patients.  Critical care was necessary to treat or prevent imminent or life-threatening deterioration.  Critical care was time spent personally by me on the following activities: development of treatment plan with patient and/or surrogate as well as nursing, discussions with consultants, evaluation of patient's response to treatment, examination of patient, obtaining history from patient or surrogate, ordering and performing treatments and interventions, ordering and review of laboratory studies, ordering and review of radiographic studies, pulse oximetry and re-evaluation of patient's condition.  Loralie Champagne 06/13/2018 9:13 AM

## 2018-06-14 ENCOUNTER — Inpatient Hospital Stay (HOSPITAL_COMMUNITY): Payer: Medicare Other

## 2018-06-14 LAB — CK: Total CK: 14236 U/L — ABNORMAL HIGH (ref 49–397)

## 2018-06-14 LAB — RENAL FUNCTION PANEL
ALBUMIN: 1.7 g/dL — AB (ref 3.5–5.0)
Albumin: 1.5 g/dL — ABNORMAL LOW (ref 3.5–5.0)
Anion gap: 10 (ref 5–15)
Anion gap: 9 (ref 5–15)
BUN: 14 mg/dL (ref 8–23)
BUN: 15 mg/dL (ref 8–23)
CALCIUM: 7.4 mg/dL — AB (ref 8.9–10.3)
CHLORIDE: 106 mmol/L (ref 98–111)
CO2: 23 mmol/L (ref 22–32)
CO2: 24 mmol/L (ref 22–32)
Calcium: 7.5 mg/dL — ABNORMAL LOW (ref 8.9–10.3)
Chloride: 104 mmol/L (ref 98–111)
Creatinine, Ser: 2.34 mg/dL — ABNORMAL HIGH (ref 0.61–1.24)
Creatinine, Ser: 1.74 mg/dL — ABNORMAL HIGH (ref 0.61–1.24)
GFR calc Af Amer: 44 mL/min — ABNORMAL LOW (ref >60)
GFR calc non Af Amer: 38 mL/min — ABNORMAL LOW (ref >60)
GFR, EST AFRICAN AMERICAN: 31 mL/min — AB (ref >60)
GFR, EST NON AFRICAN AMERICAN: 26 mL/min — AB (ref >60)
GLUCOSE: 116 mg/dL — AB (ref 70–99)
Glucose, Bld: 120 mg/dL — ABNORMAL HIGH (ref 70–99)
PHOSPHORUS: 1.6 mg/dL — AB (ref 2.5–4.6)
POTASSIUM: 4 mmol/L (ref 3.5–5.1)
Phosphorus: 1.4 mg/dL — ABNORMAL LOW (ref 2.5–4.6)
Potassium: 4.1 mmol/L (ref 3.5–5.1)
SODIUM: 137 mmol/L (ref 135–145)
Sodium: 139 mmol/L (ref 135–145)

## 2018-06-14 LAB — COOXEMETRY PANEL
Carboxyhemoglobin: 1.5 % (ref 0.5–1.5)
Carboxyhemoglobin: 1.9 % — ABNORMAL HIGH (ref 0.5–1.5)
Methemoglobin: 1.8 % — ABNORMAL HIGH (ref 0.0–1.5)
Methemoglobin: 1.4 % (ref 0.0–1.5)
O2 SAT: 60.7 %
O2 Saturation: 65.2 %
TOTAL HEMOGLOBIN: 14.1 g/dL (ref 12.0–16.0)
TOTAL HEMOGLOBIN: 9.7 g/dL — AB (ref 12.0–16.0)

## 2018-06-14 LAB — CBC
HCT: 30.8 % — ABNORMAL LOW (ref 39.0–52.0)
Hemoglobin: 9.7 g/dL — ABNORMAL LOW (ref 13.0–17.0)
MCH: 28.6 pg (ref 26.0–34.0)
MCHC: 31.5 g/dL (ref 30.0–36.0)
MCV: 90.9 fL (ref 80.0–100.0)
NRBC: 0.6 % — AB (ref 0.0–0.2)
PLATELETS: 285 10*3/uL (ref 150–400)
RBC: 3.39 MIL/uL — AB (ref 4.22–5.81)
RDW: 17.7 % — AB (ref 11.5–15.5)
WBC: 18.5 10*3/uL — AB (ref 4.0–10.5)

## 2018-06-14 LAB — GLUCOSE, CAPILLARY
GLUCOSE-CAPILLARY: 102 mg/dL — AB (ref 70–99)
GLUCOSE-CAPILLARY: 129 mg/dL — AB (ref 70–99)
GLUCOSE-CAPILLARY: 109 mg/dL — AB (ref 70–99)
Glucose-Capillary: 113 mg/dL — ABNORMAL HIGH (ref 70–99)
Glucose-Capillary: 115 mg/dL — ABNORMAL HIGH (ref 70–99)
Glucose-Capillary: 116 mg/dL — ABNORMAL HIGH (ref 70–99)

## 2018-06-14 LAB — HEPARIN LEVEL (UNFRACTIONATED)
HEPARIN UNFRACTIONATED: 0.13 [IU]/mL — AB (ref 0.30–0.70)
Heparin Unfractionated: 0.19 IU/mL — ABNORMAL LOW (ref 0.30–0.70)
Heparin Unfractionated: 0.19 IU/mL — ABNORMAL LOW (ref 0.30–0.70)

## 2018-06-14 LAB — MAGNESIUM: MAGNESIUM: 2.3 mg/dL (ref 1.7–2.4)

## 2018-06-14 LAB — TRIGLYCERIDES: Triglycerides: 182 mg/dL — ABNORMAL HIGH (ref <150)

## 2018-06-14 MED ORDER — VITAL AF 1.2 CAL PO LIQD
1000.0000 mL | ORAL | Status: DC
  Administered 2018-06-15 – 2018-06-16 (×2): 1000 mL

## 2018-06-14 MED ORDER — ACETAMINOPHEN 160 MG/5ML PO SOLN
650.0000 mg | Freq: Four times a day (QID) | ORAL | Status: DC | PRN
  Administered 2018-06-14 – 2018-06-15 (×3): 650 mg
  Filled 2018-06-14 (×3): qty 20.3

## 2018-06-14 MED ORDER — PROPOFOL 1000 MG/100ML IV EMUL
5.0000 ug/kg/min | INTRAVENOUS | Status: DC
  Administered 2018-06-14 – 2018-06-15 (×3): 10 ug/kg/min via INTRAVENOUS
  Administered 2018-06-16: 25 ug/kg/min via INTRAVENOUS
  Administered 2018-06-17 (×2): 20 ug/kg/min via INTRAVENOUS
  Administered 2018-06-18: 35 ug/kg/min via INTRAVENOUS
  Administered 2018-06-18: 60 ug/kg/min via INTRAVENOUS
  Administered 2018-06-18: 35 ug/kg/min via INTRAVENOUS
  Administered 2018-06-19 (×3): 75 ug/kg/min via INTRAVENOUS
  Administered 2018-06-19: 80 ug/kg/min via INTRAVENOUS
  Administered 2018-06-19: 75 ug/kg/min via INTRAVENOUS
  Administered 2018-06-19 (×2): 50 ug/kg/min via INTRAVENOUS
  Administered 2018-06-20 (×2): 80 ug/kg/min via INTRAVENOUS
  Filled 2018-06-14 (×4): qty 100
  Filled 2018-06-14: qty 200
  Filled 2018-06-14 (×7): qty 100
  Filled 2018-06-14: qty 200
  Filled 2018-06-14 (×5): qty 100
  Filled 2018-06-14: qty 200
  Filled 2018-06-14 (×2): qty 100

## 2018-06-14 MED ORDER — DEXMEDETOMIDINE HCL IN NACL 400 MCG/100ML IV SOLN
0.4000 ug/kg/h | INTRAVENOUS | Status: DC
  Administered 2018-06-14 (×3): 0.4 ug/kg/h via INTRAVENOUS
  Filled 2018-06-14: qty 100

## 2018-06-14 MED ORDER — PANTOPRAZOLE SODIUM 40 MG PO PACK
40.0000 mg | PACK | Freq: Every day | ORAL | Status: DC
  Administered 2018-06-14 – 2018-06-27 (×14): 40 mg
  Filled 2018-06-14 (×14): qty 20

## 2018-06-14 MED ORDER — VITAL AF 1.2 CAL PO LIQD
1000.0000 mL | ORAL | Status: DC
  Administered 2018-06-14: 1000 mL

## 2018-06-14 MED ORDER — QUETIAPINE FUMARATE 50 MG PO TABS
50.0000 mg | ORAL_TABLET | Freq: Two times a day (BID) | ORAL | Status: DC
  Administered 2018-06-14 – 2018-06-15 (×3): 50 mg via ORAL
  Filled 2018-06-14 (×3): qty 1

## 2018-06-14 MED ORDER — HYDRALAZINE HCL 20 MG/ML IJ SOLN
10.0000 mg | INTRAMUSCULAR | Status: DC | PRN
  Administered 2018-06-16 – 2018-06-25 (×2): 10 mg via INTRAVENOUS
  Filled 2018-06-14 (×4): qty 1

## 2018-06-14 MED ORDER — VANCOMYCIN HCL 10 G IV SOLR
1500.0000 mg | Freq: Once | INTRAVENOUS | Status: AC
  Administered 2018-06-14: 1500 mg via INTRAVENOUS
  Filled 2018-06-14: qty 1500

## 2018-06-14 MED ORDER — VANCOMYCIN HCL IN DEXTROSE 750-5 MG/150ML-% IV SOLN
750.0000 mg | INTRAVENOUS | Status: DC
  Administered 2018-06-15 – 2018-06-20 (×6): 750 mg via INTRAVENOUS
  Filled 2018-06-14 (×6): qty 150

## 2018-06-14 MED ORDER — INFLUENZA VAC SPLIT HIGH-DOSE 0.5 ML IM SUSY: 0.5000 mL | PREFILLED_SYRINGE | INTRAMUSCULAR | Status: DC | PRN

## 2018-06-14 NOTE — Progress Notes (Addendum)
Patient ID: RHET RORKE, male   DOB: Apr 23, 1946, 72 y.o.   MRN: [id number]     Advanced Heart Failure Rounding Note  PCP-Cardiologist: Fransico Him, MD   Subjective:    Yesterday NE stopped and dobutamine was cut back to 6 mcg. CO-OX 65%. Yesterday he had bradycardic episode so precedex was stopped.   Remains on CVVHD pulling 150cc  Remains intubated. Not following commands. Agitated.     Objective:   Weight Range: 71.1 kg Body mass index is 22.49 kg/m.   Vital Signs:   Temp:  [95.7 F (35.4 C)-100.8 F (38.2 C)] 100.2 F (37.9 C) (10/29 0500) Pulse Rate:  [55-131] 125 (10/29 0700) Resp:  [12-35] 21 (10/29 0700) BP: (89-166)/(51-90) 148/78 (10/29 0700) SpO2:  [93 %-100 %] 100 % (10/29 0700) Arterial Line BP: (89-180)/(38-75) 155/65 (10/29 0700) FiO2 (%):  [40 %] 40 % (10/29 0350) Weight:  [71.1 kg] 71.1 kg (10/29 0415) Last BM Date: 06/13/18  Weight change: Filed Weights   06/12/18 0500 06/13/18 0424 06/14/18 0415  Weight: 79 kg 75.5 kg 71.1 kg    Intake/Output:   Intake/Output Summary (Last 24 hours) at 06/14/2018 0730 Last data filed at 06/14/2018 0700 Gross per 24 hour  Intake 2007.51 ml  Output 4855 ml  Net -2847.49 ml      Physical Exam  CVP 9-10  General:  Intubated agitated  HEENT: ETT Neck: supple. JVP elevated.  Carotids 2+ bilat; no bruits. No lymphadenopathy or thryomegaly appreciated. Cor: PMI nondisplaced. Tachy Regular rate & rhythm. No rubs, gallops or murmurs. Lungs: Rhonchi Abdomen: soft, nontender, nondistended. No hepatosplenomegaly. No bruits or masses. Good bowel sounds. Extremities: no cyanosis, clubbing, rash, R and LLE 2+ edema. R and L foot with non blanching. Unable to doppler pedal pulses.  Neuro: agitated not following command.  GU: foley- hematuria   Telemetry   Sinus Tach 130s   EKG    No new tracings.   Labs    CBC Recent Labs    06/13/18 0411 06/14/18 0421  WBC 17.3* 18.5*  HGB 9.4* 9.7*  HCT 30.7*  30.8*  MCV 92.2 90.9  PLT 231 008   Basic Metabolic Panel Recent Labs    06/13/18 0411 06/13/18 2056 06/14/18 0421  NA 137 135 137  K 4.5 4.0 4.1  CL 105 103 104  CO2 24 23 23   GLUCOSE 103* 127* 120*  BUN 14 14 14   CREATININE 2.03* 1.95* 2.34*  CALCIUM 7.7* 7.7* 7.4*  MG 2.5*  --  2.3  PHOS 2.5 1.4* 1.6*   Liver Function Tests Recent Labs    06/13/18 2056 06/14/18 0421  ALBUMIN 1.7* 1.7*   No results for input(s): LIPASE, AMYLASE in the last 72 hours. Cardiac Enzymes Recent Labs    06/12/18 0421 06/13/18 0411 06/14/18 0421  CKTOTAL 24,255* 19,454* 14,236*  CKMB 174.1*  --   --     BNP: BNP (last 3 results) Recent Labs    06/16/2018 0854  BNP 18.0    ProBNP (last 3 results) No results for input(s): PROBNP in the last 8760 hours.   D-Dimer No results for input(s): DDIMER in the last 72 hours. Hemoglobin A1C No results for input(s): HGBA1C in the last 72 hours. Fasting Lipid Panel No results for input(s): CHOL, HDL, LDLCALC, TRIG, CHOLHDL, LDLDIRECT in the last 72 hours. Thyroid Function Tests No results for input(s): TSH, T4TOTAL, T3FREE, THYROIDAB in the last 72 hours.  Invalid input(s): FREET3  Other results:   Imaging  No results found.   Medications:     Scheduled Medications: . chlorhexidine gluconate (MEDLINE KIT)  15 mL Mouth Rinse BID  . Chlorhexidine Gluconate Cloth  6 each Topical Q0600  . darbepoetin (ARANESP) injection - NON-DIALYSIS  200 mcg Subcutaneous Q Sun-1800  . feeding supplement (VITAL AF 1.2 CAL)  1,000 mL Per Tube Q24H  . lidocaine  1 application Urethral Once  . lidocaine  1 application Urethral Once  . mouth rinse  15 mL Mouth Rinse 10 times per day  . metoCLOPramide (REGLAN) injection  5 mg Intravenous Q6H  . sodium chloride flush  10-40 mL Intracatheter Q12H  . THROMBI-PAD  1 each Topical Once    Infusions: . sodium chloride Stopped (06/13/18 1012)  . sodium chloride 10 mL/hr at 06/13/18 1013  .  DOBUTamine 6 mcg/kg/min (06/14/18 0600)  . famotidine (PEPCID) IV 20 mg (06/13/18 0909)  . fentaNYL infusion INTRAVENOUS 300 mcg/hr (06/14/18 0600)  . heparin 1,250 Units/hr (06/14/18 0612)  . norepinephrine (LEVOPHED) Adult infusion Stopped (06/14/18 0040)  . piperacillin-tazobactam (ZOSYN)  IV 3.375 g (06/14/18 0502)  . dialysis replacement fluid (prismasate) 300 mL/hr at 06/14/18 0104  . dialysis replacement fluid (prismasate) 350 mL/hr at 06/13/18 1018  . prismasol BGK 4/2.5 1,500 mL/hr at 06/14/18 8295  . sodium chloride    . sodium chloride      PRN Medications: sodium chloride, fentaNYL (SUBLIMAZE) injection, heparin, midazolam, sodium chloride, sodium chloride flush, white petrolatum    Patient Profile   HAYDYN GIRVAN is a 72 y.o. 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  Presented to [facility] after PEA arrest with 10+ minutes down, presumed large PE. Now with RV failure.  Assessment/Plan   1. Shock: Mixed cardiogenic/septic. 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 Also septic shock with very high PCT, fever, and presumed PNA.  Echo 10/2014 showed EF 60%, RV normal, PA peak pressure 73 mmHg.  Echo 06/09/2018 showed LV EF 55-60%, RV severely dilated and dysfunctional, moderate TR, moderately dilated RA, PA peak pressure 31 mmHg.   - Did not place RV impella with extensive bleeding post-TNKase and extensive clot.  - Continue CVVH for volume removal, weight dwon another 10 pounds.  - Hypertensive. He is off norepi. Cut back dobutamine to 3 mcg. CO-OX >60%.   2. PEA arrest: Suspect secondary to massive PE.  Received 10+ minutes CPR. Head CT negative.  Hard to know what his neurologic status due to agitation.  3. Presumed massive PE: Has hx of PE in 2016, but has not been on chronic anticoagulation (off >1 year).  Lower extremity dopplers this admission positive for left popliteal acute DVT.  Received  TNKase in ED.  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  - Continue heparin drip.  4. ID: WBCs 17.  PCT was > 150. Last procalcitonin was 75. Concerned for HCAP, possibly in setting of aspiration (lower lobe infiltrates on CXR).  Cultures remain negative.  WBC 15>17.  - Off vancomycin, continues on Zosyn.  5. AKI on CKD: Baseline creatinine looks to be 1.6-2.2 range (cause of baseline CKD uncertain).  Remains on CVVH, UF 150 cc/hr.  Renal US negative for hydronephrosis.  AKI due to shock with likely contribution from rhabdomyolysis.  - Continue CVVH, needs more volume off.  6. Anemia: Profuse bleeding from OGT and urethra post-TNKase.  He has received multiple units  PRBCs, hgb up  9.7. Stable.   - Urology consulted. Supportive care for now, but recommended further imaging if possible and ?TURP in future. Continues to have hematuria with foley irrigation.  7. Elevated liver enzymes: In setting of shock (ischemic hepatitis). Check LFTs in am.  8. Hypocalcemia: per nephrology. 9. Acute respiratory hypoxemic respiratory failure: Remains intubated, suspect due to combination of PNA and pulmonary edema. Remains intubated. FiO2 40%. No change. More secretions otday.  10. Thrombocytopenia: Platelets normal.  Suspect fall in plts in setting of sepsis, critical illness/inflammation.  11. Rhabdomyolysis: CK trending down. No role for IVF in setting of ESRD - Per primary. Continue CRRT 12. Ileus: Resolving, TFs going in.   Length of Stay: [place], NP  06/14/2018, 7:30 AM  Advanced Heart Failure Team Pager [phone] (M-F; [place])  Please contact [facility] for night-coverage after hours (4p -7a ) and weekends on [email or web address]  Patient seen with NP, agree with the above note.   He is now off norepinephrine, remains on dobutamine.  SBP 150s, currently agitated but not following commands.  Co-ox 65%.  CVP 9-10.  CVVH continues 150  cc/hr, weight continues to come down.  Patient tachy to 130s in setting of agitation, rhythm difficult.   On exam, 2+ edema to thighs.  JVP 8-9 cm. Decreased BS at bases lung exam.  Tachy rhythm  Good co-ox today, decrease dobutamine to 3 and may be able to stop.  In setting of agitation, BP is elevated.  Will write for prn hydralazine but may end up needing standing doses.   Still some volume overload, continue CVVH with UF 150 cc/hr for today.   CK continues to trend down.   Very agitated this am but not following commands, will likely need sedation.   Heparin gtt for PE.   Remains on empiric Zosyn for HCAP.   Rhythm difficult, ?flutter/fib versus sinus with PACs.  Will get ECG.   CRITICAL CARE Performed by: Loralie Champagne  Total critical care time: 35 minutes  Critical care time was exclusive of separately billable procedures and treating other patients.  Critical care was necessary to treat or prevent imminent or life-threatening deterioration.  Critical care was time spent personally by me on the following activities: development of treatment plan with patient and/or surrogate as well as nursing, discussions with consultants, evaluation of patient's response to treatment, examination of patient, obtaining history from patient or surrogate, ordering and performing treatments and interventions, ordering and review of laboratory studies, ordering and review of radiographic studies, pulse oximetry and re-evaluation of patient's condition.   Loralie Champagne 06/14/2018 8:13 AM

## 2018-06-14 NOTE — Progress Notes (Signed)
[facility] Progress Note    Assessment/ Plan:   72 year old BM with CKD- (1.5 to 2.0 in 2016, upon presentation was 2.5) and history of PE who presented with PEA arrest, presumed PE with hemodynamic instability and A on CRF 1.Renal-acute on chronic kidney disease. Not sure of the etiology of his chronic kidney disease. However, his acute is most likely secondary to his multisystem organ failure and prolonged hypotension requiring pressors. He is oliguric now requiring renal replacement therapy. CRRT started 10/23. Running well, no heparin, all 4 k bags. I do not think that rhabdo is the cause of his AKI- is probably from code and being in ICU for several days- there is not an effective way to treat rhabdo in an oliguric pt who is already volume overloaded- just need to let it reabsorb  - continues to req RRT and fortunately tolerating CRRT - continue UF at current rate with e/o vol overload; BP still elevated and he still has a lot of edema in the thights. Wt down to 71.1kg but bedscale (yest 75.5kg)  2. Hypertension/volume- initially massively volume overloaded and still w/ significant edema,receiving lots of drips and therefore lots of volume. CVP had been near 20, is better now. they have permission to pull 50-100 per hour-exceeding 3.Hyperkalemia-resolved, continue  4 k bags 4. Anemia-situational and multifactorial. Has been transfused this admission-supportive care, per CCM- transfuse as needed- will add ESA 5.Metabolic acidosis- improved 6.Hemodynamic instability-multiple pressors with inability to wean. Cardiogenic versus septic shock per cardiology and CCM- some progress 7. Gross hematuria- Renal u/s normal (no hydro) on 10/20, - urology placed cath- achieving good drainage for now, just very little UOP, nurse irrigating  Subjective:   Off Levo and dobutamine down as well.  Tolerating CRRT  Seen on CRRT 4K bath Tolerating 125-150 ml/hr and  still has e/o excessive volume onboard RIJ temp cath Pre/post/qd 300/350/1500 with FF of 10%   Objective:   BP (!) 159/79   Pulse (!) 133   Temp (!) 101.1 F (38.4 C)   Resp 17   Ht _0  (1.778 m)   Wt 71.1 kg   SpO2 100%   BMI 22.49 kg/m   Intake/Output Summary (Last 24 hours) at 06/14/2018 0840 Last data filed at 06/14/2018 0800 Gross per 24 hour  Intake 1938.64 ml  Output 4803 ml  Net -2864.36 ml   Weight change: -4.4 kg  Physical Exam: General: sedated , intubated  Heart: tachy Lungs: rhonchi bilat Abdomen: distended, no BS Extremities: pitting edema up to thighs Dialysis Access: right IJ temp cath placed 10/23  Imaging: Dg Chest Port 1 View  Result Date: 06/13/2018 CLINICAL DATA:  Respiratory failure, intubated patient. History of acute renal failure and right heart failure. 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 EXAM: PORTABLE CHEST 1 VIEW COMPARISON:  Portable chest x-ray of June 12, 2018 FINDINGS: The lungs are reasonably well inflated. The right hemidiaphragm is better demonstrated today. There is persistent obscuration of the left hemidiaphragm with haziness in the retrocardiac region. The heart is normal in size. The pulmonary vascularity is not engorged. The endotracheal tube tip projects approximately 8.5 cm above the carina with the tip at the superior margin of the clavicular heads. The esophagogastric tubes proximal port is at or just below the GE junction with the tip projecting below the inferior margin of the image. The right internal jugular venous catheter tip projects over the proximal SVC. IMPRESSION: High positioning of the endotracheal tube.  Correlation clinically as to the type with tube in adequate C of this positioning is needed. Borderline high positioning of the esophagogastric tube. Advancement by 5 cm would assure that the proximal port remains below the GE junction. Bibasilar atelectasis, stable on the left and  improved on the right. Electronically Signed   By: David  Martinique M.D.   On: 06/13/2018 08:46    Labs: BMET Recent Labs  Lab 06/11/18 0306 06/11/18 1559 06/12/18 0421 06/12/18 1610 06/13/18 0411 06/13/18 2056 06/14/18 0421  NA 135 137 136 137 137 135 137  K 4.6 4.6 4.5 4.5 4.5 4.0 4.1  CL 105 108 105 105 105 103 104  CO2 _0 GLUCOSE 120* 104* 88 101* 103* 127* 120*  BUN _1 CREATININE 2.19* 2.10* 2.10* 2.05* 2.03* 1.95* 2.34*  CALCIUM 6.7* 6.7* 7.1* 7.3* 7.7* 7.7* 7.4*  PHOS 3.5 3.2 2.5 2.4* 2.5 1.4* 1.6*   CBC Recent Labs  Lab 06/08/18 0441  06/11/18 0306 06/12/18 0421 06/13/18 0411 06/14/18 0421  WBC 26.0*   < > 12.8* 15.4* 17.3* 18.5*  NEUTROABS 16.1*  --   --   --   --   --   HGB 8.0*   < > 8.1* 7.7* 9.4* 9.7*  HCT 22.7*   < > 25.3* 24.3* 30.7* 30.8*  MCV 81.4   < > 89.7 92.4 92.2 90.9  PLT 56*   < > 88* 154 231 285   < > = values in this interval not displayed.    Medications:    . chlorhexidine gluconate (MEDLINE KIT)  15 mL Mouth Rinse BID  . Chlorhexidine Gluconate Cloth  6 each Topical Q0600  . darbepoetin (ARANESP) injection - NON-DIALYSIS  200 mcg Subcutaneous Q Sun-1800  . feeding supplement (VITAL AF 1.2 CAL)  1,000 mL Per Tube Q24H  . lidocaine  1 application Urethral Once  . lidocaine  1 application Urethral Once  . mouth rinse  15 mL Mouth Rinse 10 times per day  . metoCLOPramide (REGLAN) injection  5 mg Intravenous Q6H  . pantoprazole sodium  40 mg Per Tube Q1200  . QUEtiapine  50 mg Oral BID  . sodium chloride flush  10-40 mL Intracatheter Q12H  . THROMBI-PAD  1 each Topical Once      Otelia Santee, MD 06/14/2018, 8:40 AM

## 2018-06-14 NOTE — Progress Notes (Signed)
VAST RN called unit and spoke with pt's nurse who stated she already discontinued central line as ordered.

## 2018-06-14 NOTE — Progress Notes (Addendum)
ANTICOAGULATION CONSULT NOTE   Pharmacy Consult for Heparin Indication: suspected pulmonary embolus and confirmed DVT  No Known Allergies  Patient Measurements: Height: 5\' 10"  (177.8 cm) Weight: 156 lb 12 oz (71.1 kg) IBW/kg (Calculated) : 73  Vital Signs: Temp: 100.2 F (37.9 C) (10/29 2200) Temp Source: Esophageal (10/29 2000) BP: 112/83 (10/29 2200) Pulse Rate: 100 (10/29 2200)  Labs: Recent Labs    06/12/18 0421  06/13/18 0411  06/13/18 2056 06/14/18 0421 06/14/18 1248 06/14/18 1507 06/14/18 2207  HGB 7.7*  --  9.4*  --   --  9.7*  --   --   --   HCT 24.3*  --  30.7*  --   --  30.8*  --   --   --   PLT 154  --  231  --   --  285  --   --   --   HEPARINUNFRC 0.35  --  0.31   < >  --  0.13* 0.19*  --  0.19*  CREATININE 2.10*   < > 2.03*  --  1.95* 2.34*  --  1.74*  --   CKTOTAL 24,255*  --  19,454*  --   --  14,236*  --   --   --   CKMB 174.1*  --   --   --   --   --   --   --   --    < > = values in this interval not displayed.    Estimated Creatinine Clearance: 39.2 mL/min (A) (by C-G formula based on SCr of 1.74 mg/dL (H)).  Assessment: 72yo male with DVT and likely PE s/p PEA arrest and TNKase 10/20 for heparin. Currently on CRRT with minimal UOP.   Gross hematuria now resolved and anemia s/p pRBC 10/25.  Heparin level remains subtherapeutic at 0.19 on heparin 1350 units/hr.    Goal of Therapy:  Heparin level 0.3-0.5 units/ml Monitor platelets by anticoagulation protocol: Yes   Plan:  Increase Heparin to 1450 units/hr Heparin level in 8 hours  Daily heparin level and cbc  Thanks for allowing pharmacy to be a part of this patient's care.  Bryan Parker, PharmD Clinical Pharmacist  Addum:  Heparin level remains subtherapeutic.  Increase heparin to 1600 units/hr.  Check heparin level in 6-8 hours

## 2018-06-14 NOTE — Progress Notes (Signed)
Subjective: Nurse reports pt off pressors. Urine clearing. Minimal UOP. She just flushed catheter and light pink/red irrigation in tubing.   Objective: Vital signs in last 24 hours: Temp:  [98.6 F (37 C)-101.1 F (38.4 C)] 100.8 F (38.2 C) (10/29 1600) Pulse Rate:  [55-139] 93 (10/29 1600) Resp:  [13-35] 17 (10/29 1600) BP: (76-166)/(49-90) 109/78 (10/29 1600) SpO2:  [93 %-100 %] 100 % (10/29 1600) Arterial Line BP: (87-180)/(38-79) 130/58 (10/29 1600) FiO2 (%):  [40 %] 40 % (10/29 1538) Weight:  [71.1 kg] 71.1 kg (10/29 0415)  Intake/Output from previous day: 10/28 0701 - 10/29 0700 In: 2007.5 [I.V.:1117.5; NG/GT:440; IV Piggyback:250] Out: 4855 [Urine:157] Intake/Output this shift: Total I/O In: 1503.9 [I.V.:409.2; NG/GT:420; IV Piggyback:674.7] Out: 2718 [Other:2718]  Physical Exam:  Intubated Penile edema - much better Urine light red/pink in tubing   Lab Results: Recent Labs    06/12/18 0421 06/13/18 0411 06/14/18 0421  HGB 7.7* 9.4* 9.7*  HCT 24.3* 30.7* 30.8*   BMET Recent Labs    06/14/18 0421 06/14/18 1507  NA 137 139  K 4.1 4.0  CL 104 106  CO2 23 24  GLUCOSE 120* 116*  BUN 14 15  CREATININE 2.34* 1.74*  CALCIUM 7.4* 7.5*   No results for input(s): LABPT, INR in the last 72 hours. No results for input(s): LABURIN in the last 72 hours. Results for orders placed or performed during the hospital encounter of 06/07/2018  MRSA PCR Screening     Status: None   Collection Time: 06/08/2018  8:21 AM  Result Value Ref Range Status   MRSA by PCR NEGATIVE NEGATIVE Final    Comment:        The GeneXpert MRSA Assay (FDA approved for NASAL specimens only), is one component of a comprehensive MRSA colonization surveillance program. It is not intended to diagnose MRSA infection nor to guide or monitor treatment for MRSA infections. Performed at [facility], [address], [place], [place] [address]   Culture, blood (routine x 2)     Status:  None   Collection Time: 05/28/2018  9:00 AM  Result Value Ref Range Status   Specimen Description BLOOD RIGHT CENTRAL LINE  Final   Special Requests   Final    BOTTLES DRAWN AEROBIC AND ANAEROBIC Blood Culture adequate volume   Culture   Final    NO GROWTH 5 DAYS Performed at [facility], [address], [place], [place] [address]    Report Status 06/10/2018 FINAL  Final  Culture, blood (routine x 2)     Status: None   Collection Time: 05/25/2018  9:00 AM  Result Value Ref Range Status   Specimen Description BLOOD LEFT A-LINE  Final   Special Requests   Final    BOTTLES DRAWN AEROBIC AND ANAEROBIC Blood Culture adequate volume   Culture   Final    NO GROWTH 5 DAYS Performed at [facility], [address] [place], [place] [address]    Report Status 06/10/2018 FINAL  Final  Culture, respiratory (non-expectorated)     Status: None   Collection Time: 06/11/2018 11:30 AM  Result Value Ref Range Status   Specimen Description TRACHEAL ASPIRATE  Final   Special Requests NONE  Final   Gram Stain   Final    RARE WBC PRESENT, PREDOMINANTLY PMN MODERATE GRAM POSITIVE COCCI IN PAIRS IN CHAINS RARE GRAM VARIABLE ROD    Culture   Final    MODERATE Consistent with normal  respiratory flora. Performed at [facility], [address] [place], [place] [address]    Report Status 06/07/2018 FINAL  Final  Culture, respiratory (non-expectorated)     Status: None (Preliminary result)   Collection Time: 06/14/18 10:21 AM  Result Value Ref Range Status   Specimen Description TRACHEAL ASPIRATE  Final   Special Requests NONE  Final   Gram Stain   Final    RARE SQUAMOUS EPITHELIAL CELLS PRESENT RARE WBC PRESENT, PREDOMINANTLY PMN RARE GRAM POSITIVE COCCI Performed at [facility], [address], [place], [place] [address]    Culture PENDING  Incomplete   Report Status PENDING  Incomplete    Studies/Results: Dg Chest Port 1 View  Result Date: 06/14/2018 CLINICAL DATA:   Acute respiratory failure. EXAM: PORTABLE CHEST 1 VIEW COMPARISON:  Radiograph of June 13, 2018. FINDINGS: The heart size and mediastinal contours are within normal limits. Stable position of endotracheal and nasogastric tubes. Right internal jugular catheter is unchanged. No pneumothorax is noted. Mild bibasilar subsegmental atelectasis is noted. The visualized skeletal structures are unremarkable. IMPRESSION: Stable support apparatus.  Mild bibasilar subsegmental atelectasis. Electronically Signed   By: Lupita Raider, M.D.   On: 06/14/2018 09:39   Dg Chest Port 1 View  Result Date: 06/13/2018 CLINICAL DATA:  Respiratory failure, intubated patient. History of acute renal failure and right heart failure. 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 EXAM: PORTABLE CHEST 1 VIEW COMPARISON:  Portable chest x-ray of June 12, 2018 FINDINGS: The lungs are reasonably well inflated. The right hemidiaphragm is better demonstrated today. There is persistent obscuration of the left hemidiaphragm with haziness in the retrocardiac region. The heart is normal in size. The pulmonary vascularity is not engorged. The endotracheal tube tip projects approximately 8.5 cm above the carina with the tip at the superior margin of the clavicular heads. The esophagogastric tubes proximal port is at or just below the GE junction with the tip projecting below the inferior margin of the image. The right internal jugular venous catheter tip projects over the proximal SVC. IMPRESSION: High positioning of the endotracheal tube. Correlation clinically as to the type with tube in adequate C of this positioning is needed. Borderline high positioning of the esophagogastric tube. Advancement by 5 cm would assure that the proximal port remains below the GE junction. Bibasilar atelectasis, stable on the left and improved on the right. Electronically Signed   By: David  [place] M.D.   On: 06/13/2018 08:46     Assessment/Plan: Gross hematuria - CT C/A/P pending. Will follow. Hematuria improving.    LOS: 9 days   Jerilee Field 06/14/2018, 4:25 PM

## 2018-06-14 NOTE — Progress Notes (Signed)
NAME:  Bryan Parker, MRN:  [id number], DOB:  03-24-1946, LOS: 9 ADMISSION DATE:  07/01/2018, CONSULTATION DATE:  07-01-18 REFERRING MD:  Dr. Rhunette Croft , CHIEF COMPLAINT:  Cardiac Arrest    Brief History   72 year old male presents to ED s/p Cardiac Arrest. Per Report patient called EMS for Abdominal Pain/Nausea/Vomiting. When EMS arrived patient was alert however went pulseless and PEA while on scene. ROSC achieved after 2 EPI and 10 minutes CPR. On arrival patient continued to be unstable and required multiple rounds of CPR. Fast exam negative. Bedside ECHO revealed slightly dilated RV. ABG 6.914/69.5/368. Progressive hypotension requiring Levophed/EPI/Vasopresin and 9 Bicarb pushes.    Wife reports that patient was complaining of severe left calf pain all day 10/19. Has a history of PE 2016. Was taken off anticoagulation 2018. Due to high suspicion of PE TNKase was given in ED. PCCM asked to admit.   Past Medical History  H/O DVT/PE, CKD  Significant Hospital Events   10/20 > Presents to ED  1021 with a partial code no CPR 10/22 bleeding from IV sites and foley, heparin d/c'd for 2 hours then resumed. Hgb 6.8 > 1u PRBC transfused 10/23 CRRT started.  Giapreza added. 10/24 coude catheter placed by urology and CBI started. 10/25 - Pressor requirements much improved after giaprezza added 10/23; however, remains on vaso0.03, NE 23, epi 10, dobut 20, giapreza 25. CK up from 14.5 on 10/24 up to 35K on 10/25.  CRRT running.  10/26 - CK 31,000 (was 34,000 yesterday) . Volume removal continues. Fluid challenge strategy cancelled. CK better. Legs thinner with volume removal. Pressors: off giaprezza, levophed 6, dobutamine 10, epi 16 and vaso 0.03. Had vomit yesterday and TF on hold. Has bloody urine - seen by urology  10/27  - pressor needs coming down - off epi gtt. Dobutamine 10, Vaso 0.03 and levophed 4-60mcg/min. . Still largely volume . Volume removal via CRRT. CK down to 24K. TF still on hold.    10/28 brady , hypotensive with precedex   Consults: date of consult/date signed off & final recs:  PCCM 10/20 Heart failure 10/21 >  Urology 10/24 >   Procedures (surgical and bedside):  ETT 10/20 >> Right Femoral CVC 10/20 >> Left Femoral Aline 10/20 >>  L IJ HD cath 10/23 >   Significant Diagnostic Tests:  CT Head 10/20 >> neg Echo 10/20 > EF 55 - 60%, severely dilated RV, mod dilated RA, mod TR.  Micro Data:  Blood 10/20 >> neg Sputum 10/20 >> neg U/A 10/20 >> neg  Antimicrobials:  zosyn 10/20 >> vanc 10/20 >>off  SUBJECTIVE/OVERNIGHT/INTERVAL HX  Remains critically ill, agitated on the ventilator, on high-dose fentanyl drip Febrile On CRRT, anuric   Objective   Blood pressure (!) 159/79, pulse (!) 133, temperature (!) 101.1 F (38.4 C), resp. rate 17, height 5\' 10"  (1.778 m), weight 71.1 kg, SpO2 100 %. CVP:  [7 mmHg-10 mmHg] 9 mmHg  Vent Mode: PRVC FiO2 (%):  [40 %] 40 % Set Rate:  [14 bmp] 14 bmp Vt Set:  [500 mL] 500 mL PEEP:  [5 cmH20] 5 cmH20 Plateau Pressure:  [8 cmH20-16 cmH20] 16 cmH20   Intake/Output Summary (Last 24 hours) at 06/14/2018 0840 Last data filed at 06/14/2018 0800 Gross per 24 hour  Intake 1938.64 ml  Output 4803 ml  Net -2864.36 ml   Filed Weights   06/12/18 0500 06/13/18 0424 06/14/18 0415  Weight: 79 kg 75.5 kg 71.1 kg  Agitated on vent, vent alarming continuously Copious secretions, decreased breath sounds bilateral Mild pallor, no icterus Cool extremities, dark toes S1-S2 tacky Soft and nontender abdomen Liquid stool  Chest x-ray personally reviewed shows clearing of infiltrates, ET tube was high nasogastric tube appears better position       Assessment & Plan:   #acute resp failure - s/p intubation in ED.   PLAN -Adjust ET tube position -Spontaneous breathing trials,  limited by encephalopathy and deconditioning and shock  #PEA Cardiac Arrest - concern due to massive PE.   Shock - likely mixed,  obstructive due to above and likely septic with unclear etiology given PCT > 150. - off pressors  plan -Taper dobutamine to off - Cardiology following.  #Concern for PE - s/p TPA in ED. LLE DVT. Hx PE / DVT 2016 (no longer on anticoagulation).  Plan - Continue heparin gtt. - Monitor CBC and bleeding  Foley, continue to irrigate Foley  #Septic shock -   PCT > 150 on admit, HCAP vs aspiration. New fever  -dc femoral CVL & aline if able, pancx - Continue zosyn x 10 ds total or until pct < 0.5   - CT chest & abdomen when CRRT filter clots  #AKI on Chronic Kidney Failure - likely exacerbated by arrest and rhabdo.  Now on CRRT. #Rhabdomyolysis - CK peaked at 35K on 10/25 now down to 14k  - Nephrology following - Correct electrolytes as indicated. - Follow CK daily but no way to decrease this other than HD    #Acute blood loss anemia - had bleeding from IV sites and foley - about 5-6 U PRBC so far  PLAN - Maintain Hgb > 8 following PEA arrest.   Urethral bleeding - likely traumatic following tPA administration. - Urology following, doubt need for cystoscopy. - getting daily irrigation by RRN  #Thrombocytopenia.-  resolved   #Hyperglycemia. - Continue SSI.  #Acute Encephalopathy - metabolic + from PEA arrest. -Resume Precedex -limited by bradycardia and hypotension,  -goal RASS 0 to -1 -Start Seroquel 50 twice daily  #Vomit 10/25/19KUB - no ileus PLAN  -Titrate TF to goal   Disposition / Summary of Today's Plan 06/14/18    Full ICU care  Code Status: 06/07/2018 partial code no CPR Family Communication: updated wife and daughter at bedside  Retry Precedex today, yesterday was limited by bradycardia and hypotension but blood pressure appears better today, also added Seroquel Once encephalopathy better controlled can wean aggressively, extubation may be limited by secretions today.  He will need to continue on CRRT. CT chest and abdomen at some point Would like  to discontinue femoral line & pancx   ATTESTATION & SIGNATURE   The patient is critically ill with multiple organ systems failure and requires high complexity decision making for assessment and support, frequent evaluation and titration of therapies, application of advanced monitoring technologies and extensive interpretation of multiple databases. Critical Care Time devoted to patient care services described in this note independent of APP/resident  time is 35 minutes.   Cyril Mourning MD. Tonny Bollman. [facility] Pager [phone] If no response call [phone]    06/14/2018

## 2018-06-14 NOTE — Progress Notes (Signed)
Nutrition Consult/Follow Up  DOCUMENTATION CODES:   Not applicable  INTERVENTION:    Increase Vital AF 1.2 to goal rate of 60 ml/h (1440 ml per day)   TF + Propofol provides 1841 kcals, 108 gm protein, 1168 ml free water daily  NUTRITION DIAGNOSIS:   Inadequate oral intake related to inability to eat as evidenced by NPO status, ongoing  GOAL:   Patient will meet greater than or equal to 90% of their needs, progressing  MONITOR:   Vent status, TF tolerance, Labs, Skin, Weight trends, I & O's  ASSESSMENT:   72 y.o. male with hx of HTN, CKD, lg PE 2016 and no longer anticoagulated. He was admitted 10/20 after PEA cardiac arrest. Coded repeatedly in the ER. High suspicion of PE, TNKase given in ED.    Patient is currently intubated on ventilator support MV: 9.7 L/min Temp (24hrs), Avg:99.6 F (37.6 C), Min:98.6 F (37 C), Max:101.1 F (38.4 C)  Propofol: 4.27 ml/hr >> 113 fat kcals  Spoke with Alexia Freestone, Charity fundraiser. Vital AF 1.2 infusing at 30 ml/hr via OGT. RD consulted per MD to increase TF formula to goal rate. Cardiogenic shock due to RV failure. Presumed massive PE.Bryan Parker  Acute on CKD. Volume overload. Started on CRRT 10/23. Labs reviewed. Phos 1.6 (L). Mg 2.3 WNL.  CBG's [id number].  Noted pt had vomiting episode and TF was held 10/26.  Diet Order:   Diet Order            Diet NPO time specified  Diet effective now             EDUCATION NEEDS:   Not appropriate for education at this time  Skin:  Skin Assessment: Skin Integrity Issues: Skin Integrity Issues:: DTI DTI: bilateral heels  Last BM:  10/29    Intake/Output Summary (Last 24 hours) at 06/14/2018 1353 Last data filed at 06/14/2018 1300 Gross per 24 hour  Intake 2632.09 ml  Output 5055 ml  Net -2422.91 ml   Height:   Ht Readings from Last 1 Encounters:  06/11/18 5\' 10"  (1.778 m)   Weight:   Wt Readings from Last 1 Encounters:  06/14/18 71.1 kg  2018-06-15          62.1 kg  Ideal Body  Weight:  75.45 kg  BMI:  Body mass index is 22.49 kg/m.  Estimated Nutritional Needs (using admit weight):   Kcal:  1827  Protein:  105-120 gm  Fluid:  per MD  Maureen Chatters, RD, LDN Pager #: [phone] After-Hours Pager #: [phone]

## 2018-06-14 NOTE — Progress Notes (Signed)
Pharmacy Antibiotic Note  Bryan Parker is a 72 y.o. male admitted on 06-25-18 with possible aspiration pna/sepsis. Pharmacy has been reconsulted to start vancomycin. Continues on CRRT. Tmax 101.1, WBC up to 18.5, lactate 1.1, PCT 150 > 75.62. Resent blood and TA cultures today.   Plan: Vancomycin 1500 mg IV x1 then vancomycin 750 mg IV q24h Continue Zosyn 2.25 g q6h for now   Height: 5\' 10"  (177.8 cm) Weight: 156 lb 12 oz (71.1 kg) IBW/kg (Calculated) : 73  Temp (24hrs), Avg:99 F (37.2 C), Min:96.3 F (35.7 C), Max:101.1 F (38.4 C)  Recent Labs  Lab 06/07/18 1058  06/08/18 1121 06/08/18 1130  06/10/18 0355 06/10/18 1050  06/10/18 2337 06/11/18 0306  06/12/18 0421 06/12/18 1610 06/13/18 0411 06/13/18 2056 06/14/18 0421  WBC  --    < >  --   --    < > 15.1*  --   --   --  12.8*  --  15.4*  --  17.3*  --  18.5*  CREATININE  --    < > 5.31*  --    < > 2.90*  --    < >  --  2.19*   < > 2.10* 2.05* 2.03* 1.95* 2.34*  LATICACIDVEN 2.8*  --  2.1*  --   --   --   --   --  1.1  --   --   --   --   --   --   --   VANCORANDOM  --   --   --  22  --   --  36  --   --   --   --   --   --   --   --   --    < > = values in this interval not displayed.    Estimated Creatinine Clearance: 29.1 mL/min (A) (by C-G formula based on SCr of 2.34 mg/dL (H)).    No Known Allergies  Dose adjustments this admission: 10/23 VR = 22 - vanc 750 mg q24h, pt started on CRRT (zosyn adjusted for CRRT) 10/25 VR = 36 - drawn after vancomycin was given   Antimicrobials:  Zosyn 10/20>>  Vancomycin 10/20 >> 10/25  Cultures: 10/29 BCx: * 10/29 TA: * 10/20 BCx: NGTD 10/20 TA: normal flora 10/20 MRSA PCR: negative    Thank you for involving pharmacy in this patient's care.   Marcelino Freestone, PharmD PGY2 Cardiology Pharmacy Resident Phone [phone] Please check AMION for all Pharmacist numbers by unit 06/14/2018 10:00 AM

## 2018-06-14 NOTE — Progress Notes (Signed)
ANTICOAGULATION CONSULT NOTE   Pharmacy Consult for Heparin Indication: suspected pulmonary embolus and confirmed DVT  No Known Allergies  Patient Measurements: Height: 5\' 10"  (177.8 cm) Weight: 156 lb 12 oz (71.1 kg) IBW/kg (Calculated) : 73  Vital Signs: Temp: 100.2 F (37.9 C) (10/29 0500) Temp Source: Esophageal (10/29 0000) BP: 132/90 (10/29 0500) Pulse Rate: 124 (10/29 0500)  Labs: Recent Labs    06/12/18 0421  06/13/18 0411 06/13/18 1310 06/13/18 2056 06/14/18 0421  HGB 7.7*  --  9.4*  --   --  9.7*  HCT 24.3*  --  30.7*  --   --  30.8*  PLT 154  --  231  --   --  285  HEPARINUNFRC 0.35  --  0.31 0.20*  --  0.13*  CREATININE 2.10*   < > 2.03*  --  1.95* 2.34*  CKTOTAL 24,255*  --  19,454*  --   --   --   CKMB 174.1*  --   --   --   --   --    < > = values in this interval not displayed.    Estimated Creatinine Clearance: 29.1 mL/min (A) (by C-G formula based on SCr of 2.34 mg/dL (H)).  Assessment: 72yo male with DVT and likely PE s/p PEA arrest and TNKase 10/20 for heparin. Currently on CRRT with minimal UOP.   Gross hematuria now resolved and anemia s/p pRBC 10/25.  10/29 AM update: Hemoglobin stable at 9.7, pltc improved to 285. Heparin level is below goal at 0.13 on 1150 units/hr. Plan was to increase heparin to 1250 units/hr yesterday but this wasn't done.  Goal of Therapy:  Heparin level 0.3-0.5 units/ml Monitor platelets by anticoagulation protocol: Yes   Plan:  Increase Heparin to 1250 units/hr 1330 heparin level Daily heparin level and cbc  Abran Duke, PharmD, BCPS Clinical Pharmacist Phone: [phone]

## 2018-06-14 NOTE — Progress Notes (Signed)
ANTICOAGULATION CONSULT NOTE   Pharmacy Consult for Heparin Indication: suspected pulmonary embolus and confirmed DVT  No Known Allergies  Patient Measurements: Height: 5\' 10"  (177.8 cm) Weight: 156 lb 12 oz (71.1 kg) IBW/kg (Calculated) : 73  Vital Signs: Temp: 99.9 F (37.7 C) (10/29 1330) Temp Source: Esophageal (10/29 0800) BP: 94/66 (10/29 1300) Pulse Rate: 74 (10/29 1330)  Labs: Recent Labs    06/12/18 0421  06/13/18 0411 06/13/18 1310 06/13/18 2056 06/14/18 0421 06/14/18 1248  HGB 7.7*  --  9.4*  --   --  9.7*  --   HCT 24.3*  --  30.7*  --   --  30.8*  --   PLT 154  --  231  --   --  285  --   HEPARINUNFRC 0.35  --  0.31 0.20*  --  0.13* 0.19*  CREATININE 2.10*   < > 2.03*  --  1.95* 2.34*  --   CKTOTAL 24,255*  --  19,454*  --   --  14,236*  --   CKMB 174.1*  --   --   --   --   --   --    < > = values in this interval not displayed.    Estimated Creatinine Clearance: 29.1 mL/min (A) (by C-G formula based on SCr of 2.34 mg/dL (H)).  Assessment: 72yo male with DVT and likely PE s/p PEA arrest and TNKase 10/20 for heparin. Currently on CRRT with minimal UOP.   Gross hematuria now resolved and anemia s/p pRBC 10/25.  Heparin level subtherapeutic at 0.19 on heparin 1250 units/hr. Hemoglobin stable at 9.7, pltc improved to 285. Urine dark red when flushing catheter, irrigates to clear. No issues with infusion reported by nursing.   Goal of Therapy:  Heparin level 0.3-0.5 units/ml Monitor platelets by anticoagulation protocol: Yes   Plan:  Increase Heparin to 1350 units/hr Heparin level in 8 hours  Daily heparin level and cbc   Marcelino Freestone, PharmD PGY2 Cardiology Pharmacy Resident Phone [phone] Please check AMION for all Pharmacist numbers by unit 06/14/2018 2:01 PM

## 2018-06-14 NOTE — Progress Notes (Signed)
eLink Physician-Brief Progress Note Patient Name: Bryan Parker DOB: 06/15/1946 MRN: [id number]   Date of Service  06/14/2018  HPI/Events of Note  Fever to 100.2 F - Nursing request for Tylenol. AST and ALT both elevated. Creatinine = 1.95. Therefore, can't use Tylenol or Motrin.   eICU Interventions  Ice packs PRN Temp > 100.5 F.     Intervention Category Major Interventions: Other:  Lenell Antu 06/14/2018, 12:37 AM

## 2018-06-14 NOTE — Plan of Care (Signed)
Foley irrigated with 300 ml sterile water with return of pink tinged fluid 250cc.

## 2018-06-15 ENCOUNTER — Inpatient Hospital Stay (HOSPITAL_COMMUNITY): Payer: Medicare Other

## 2018-06-15 DIAGNOSIS — I5033 Acute on chronic diastolic (congestive) heart failure: Secondary | ICD-10-CM

## 2018-06-15 LAB — RENAL FUNCTION PANEL
ANION GAP: 13 (ref 5–15)
Albumin: 1.6 g/dL — ABNORMAL LOW (ref 3.5–5.0)
BUN: 26 mg/dL — AB (ref 8–23)
CHLORIDE: 99 mmol/L (ref 98–111)
CO2: 22 mmol/L (ref 22–32)
Calcium: 7.7 mg/dL — ABNORMAL LOW (ref 8.9–10.3)
Creatinine, Ser: 2.45 mg/dL — ABNORMAL HIGH (ref 0.61–1.24)
GFR calc Af Amer: 29 mL/min — ABNORMAL LOW (ref >60)
GFR, EST NON AFRICAN AMERICAN: 25 mL/min — AB (ref >60)
Glucose, Bld: 113 mg/dL — ABNORMAL HIGH (ref 70–99)
POTASSIUM: 4.1 mmol/L (ref 3.5–5.1)
Phosphorus: 1.8 mg/dL — ABNORMAL LOW (ref 2.5–4.6)
Sodium: 134 mmol/L — ABNORMAL LOW (ref 135–145)

## 2018-06-15 LAB — COMPREHENSIVE METABOLIC PANEL
ALK PHOS: 118 U/L (ref 38–126)
ALT: 163 U/L — AB (ref 0–44)
AST: 335 U/L — ABNORMAL HIGH (ref 15–41)
Albumin: 1.8 g/dL — ABNORMAL LOW (ref 3.5–5.0)
Anion gap: 11 (ref 5–15)
BUN: 19 mg/dL (ref 8–23)
CALCIUM: 7.9 mg/dL — AB (ref 8.9–10.3)
CO2: 24 mmol/L (ref 22–32)
Chloride: 103 mmol/L (ref 98–111)
Creatinine, Ser: 2.12 mg/dL — ABNORMAL HIGH (ref 0.61–1.24)
GFR calc Af Amer: 34 mL/min — ABNORMAL LOW (ref >60)
GFR calc non Af Amer: 30 mL/min — ABNORMAL LOW (ref >60)
GLUCOSE: 139 mg/dL — AB (ref 70–99)
Potassium: 4.2 mmol/L (ref 3.5–5.1)
SODIUM: 138 mmol/L (ref 135–145)
Total Bilirubin: 5.5 mg/dL — ABNORMAL HIGH (ref 0.3–1.2)
Total Protein: 6.1 g/dL — ABNORMAL LOW (ref 6.5–8.1)

## 2018-06-15 LAB — POCT I-STAT 3, ART BLOOD GAS (G3+)
Acid-Base Excess: 1 mmol/L (ref 0.0–2.0)
Bicarbonate: 24.9 mmol/L (ref 20.0–28.0)
O2 Saturation: 100 %
PH ART: 7.451 — AB (ref 7.350–7.450)
TCO2: 26 mmol/L (ref 22–32)
pCO2 arterial: 36.2 mmHg (ref 32.0–48.0)
pO2, Arterial: 167 mmHg — ABNORMAL HIGH (ref 83.0–108.0)

## 2018-06-15 LAB — GLUCOSE, CAPILLARY
GLUCOSE-CAPILLARY: 107 mg/dL — AB (ref 70–99)
GLUCOSE-CAPILLARY: 108 mg/dL — AB (ref 70–99)
GLUCOSE-CAPILLARY: 108 mg/dL — AB (ref 70–99)
GLUCOSE-CAPILLARY: 123 mg/dL — AB (ref 70–99)
Glucose-Capillary: 126 mg/dL — ABNORMAL HIGH (ref 70–99)

## 2018-06-15 LAB — CBC
HEMATOCRIT: 32.2 % — AB (ref 39.0–52.0)
HEMOGLOBIN: 10.1 g/dL — AB (ref 13.0–17.0)
MCH: 28.4 pg (ref 26.0–34.0)
MCHC: 31.4 g/dL (ref 30.0–36.0)
MCV: 90.4 fL (ref 80.0–100.0)
NRBC: 1.2 % — AB (ref 0.0–0.2)
Platelets: 373 10*3/uL (ref 150–400)
RBC: 3.56 MIL/uL — AB (ref 4.22–5.81)
RDW: 17.8 % — ABNORMAL HIGH (ref 11.5–15.5)
WBC: 21.3 10*3/uL — AB (ref 4.0–10.5)

## 2018-06-15 LAB — HEPARIN LEVEL (UNFRACTIONATED)
Heparin Unfractionated: 0.21 IU/mL — ABNORMAL LOW (ref 0.30–0.70)
Heparin Unfractionated: 0.19 IU/mL — ABNORMAL LOW (ref 0.30–0.70)
Heparin Unfractionated: 0.25 IU/mL — ABNORMAL LOW (ref 0.30–0.70)

## 2018-06-15 LAB — PHOSPHORUS: PHOSPHORUS: 1.5 mg/dL — AB (ref 2.5–4.6)

## 2018-06-15 LAB — MAGNESIUM: Magnesium: 2.5 mg/dL — ABNORMAL HIGH (ref 1.7–2.4)

## 2018-06-15 MED ORDER — NOREPINEPHRINE 16 MG/250ML-% IV SOLN
0.0000 ug/min | INTRAVENOUS | Status: DC
  Administered 2018-06-16: 20 ug/min via INTRAVENOUS
  Filled 2018-06-15: qty 250

## 2018-06-15 MED ORDER — SODIUM CHLORIDE 0.9 % IV SOLN: INTRAVENOUS | Status: DC | PRN

## 2018-06-15 MED ORDER — QUETIAPINE FUMARATE 100 MG PO TABS
100.0000 mg | ORAL_TABLET | Freq: Two times a day (BID) | ORAL | Status: DC
  Administered 2018-06-15: 100 mg via ORAL
  Filled 2018-06-15: qty 1

## 2018-06-15 MED ORDER — GERHARDT'S BUTT CREAM
TOPICAL_CREAM | Freq: Four times a day (QID) | CUTANEOUS | Status: DC | PRN
  Administered 2018-06-21: 06:00:00 via TOPICAL
  Filled 2018-06-15: qty 1

## 2018-06-15 MED ORDER — HYDRALAZINE HCL 25 MG PO TABS
25.0000 mg | ORAL_TABLET | Freq: Three times a day (TID) | ORAL | Status: DC
  Administered 2018-06-15: 25 mg
  Filled 2018-06-15: qty 1

## 2018-06-15 NOTE — Progress Notes (Signed)
2nd dose of oral contrast given at 1140.

## 2018-06-15 NOTE — Progress Notes (Signed)
ANTICOAGULATION CONSULT NOTE   Pharmacy Consult for Heparin Indication: suspected pulmonary embolus and confirmed DVT  No Known Allergies  Patient Measurements: Height: 5\' 10"  (177.8 cm) Weight: 149 lb 7.6 oz (67.8 kg) IBW/kg (Calculated) : 73  Vital Signs: Temp: 101.1 F (38.4 C) (10/30 1131) Temp Source: Esophageal (10/30 0800) BP: 71/48 (10/30 1316) Pulse Rate: 114 (10/30 1316)  Labs: Recent Labs    06/13/18 0411  06/14/18 0421  06/14/18 1507 06/14/18 2207 06/15/18 0347 06/15/18 1210  HGB 9.4*  --  9.7*  --   --   --  10.1*  --   HCT 30.7*  --  30.8*  --   --   --  32.2*  --   PLT 231  --  285  --   --   --  373  --   HEPARINUNFRC 0.31   < > 0.13*   < >  --  0.19* 0.21* 0.19*  CREATININE 2.03*   < > 2.34*  --  1.74*  --  2.12*  --   CKTOTAL 19,454*  --  14,236*  --   --   --   --   --    < > = values in this interval not displayed.    Estimated Creatinine Clearance: 30.6 mL/min (A) (by C-G formula based on SCr of 2.12 mg/dL (H)).  Assessment: 72yo male with DVT and likely PE s/p PEA arrest and TNKase 10/20 for heparin. Currently on CRRT with minimal UOP.   Gross hematuria now resolved and anemia s/p pRBC 10/25.   10/30: This afternoon heparin level remains subtherapeutic at 0.19 on heparin 1600 units/hr despite rate increase this morning. Platelets stable, Hgb continues to be stable, and hematuria resolving. Per RN, no off times, but will be holding heparin for A-line removal for ~1hr, will draw level 8 hours after. Uncertain why heparin level not increasing with consistent dose increases.  Goal of Therapy:  Heparin level 0.3-0.5 units/ml Monitor platelets by anticoagulation protocol: Yes   Plan:  Increase Heparin to 1800 units/hr Heparin level in 8 hours  Daily heparin level and CBC  Thanks for allowing pharmacy to be a part of this patient's care.  Wendelyn Breslow, PharmD PGY1 Pharmacy Resident Phone: [phone] 06/15/2018 1:37 PM

## 2018-06-15 NOTE — Progress Notes (Signed)
[facility] Progress Note    Assessment/ Plan:   72 year old BM with CKD- (1.5 to 2.0 in 2016, upon presentation was 2.5) and history of PE who presented with PEA arrest, presumed PE with hemodynamic instability and A on CRF 1.Renal-acute on chronic kidney disease. Not sure of the etiology of his chronic kidney disease. However, his acute is most likely secondary to his multisystem organ failure and prolonged hypotension requiring pressors. He is oliguric now requiring renal replacement therapy. CRRT started 10/23. Running well, no heparin, all 4 k bags. I do not think that rhabdo is the cause of his AKI- is probably from code and being in ICU for several days- there is not an effective way to treat rhabdo in an oliguric pt who is already volume overloaded- just need to let it reabsorb  - continues to req RRT and fortunately tolerating CRRT - continue UF at current rate (125-160m/hr) as still with e/o vol overload; BP elevated and he still has a lot of edema in the thighs. Wt down to 67.8 kg but bedscale - Likely in the next 48-72hrs will need to decrease rate of UF  2. Hypertension/volume- initially massively volume overloaded and still w/ significant edema,receiving lots of drips and therefore lots of volume.  3.Hyperkalemia-resolved,continue4 k bags 4. Anemia-situational and multifactorial. Has been transfused this admission-supportive care, per CCM- transfuse as needed- added ESA 5.Metabolic acidosis- improved 6.Hemodynamic instability-multiple pressors with inability to wean. Cardiogenic versus septic shock per cardiology and CCM- some progress 7. Gross hematuria- Renal u/s normal (no hydro) on 10/20, - urology placed cath- achieving good drainage for now, just very little UOP, nurse irrigating.  - Light pink tinged urine  Subjective:   Off Levo and dobutamine.  Tolerating CRRT  Seen on CRRT 4K bath Tolerating 125-150 ml/hr and still has  e/o excessive volume onboard RIJ temp cath Pre/post/qd 300/350/1500 with FF of 10%   Objective:   BP (!) 145/76   Pulse 99   Temp 99.7 F (37.6 C)   Resp 20   Ht 5' 10"  (1.778 m)   Wt 67.8 kg   SpO2 100%   BMI 21.45 kg/m   Intake/Output Summary (Last 24 hours) at 06/15/2018 0809 Last data filed at 06/15/2018 0800 Gross per 24 hour  Intake 3523.07 ml  Output 6901 ml  Net -3377.93 ml   Weight change: -3.3 kg  Physical Exam: General: sedated , intubated  Heart: tachy Lungs: clear  Abdomen: distended, no BS Extremities: pitting edemaup to thighs still present Dialysis Access: right IJ temp cath placed 10/23 GU: foley in place  Imaging: Dg Chest Port 1 View  Result Date: 06/14/2018 CLINICAL DATA:  Acute respiratory failure. EXAM: PORTABLE CHEST 1 VIEW COMPARISON:  Radiograph of June 13, 2018. FINDINGS: The heart size and mediastinal contours are within normal limits. Stable position of endotracheal and nasogastric tubes. Right internal jugular catheter is unchanged. No pneumothorax is noted. Mild bibasilar subsegmental atelectasis is noted. The visualized skeletal structures are unremarkable. IMPRESSION: Stable support apparatus.  Mild bibasilar subsegmental atelectasis. Electronically Signed   By: JMarijo Conception M.D.   On: 06/14/2018 09:39    Labs: BMET Recent Labs  Lab 06/12/18 0421 06/12/18 1610 06/13/18 0411 06/13/18 2056 06/14/18 0421 06/14/18 1507 06/15/18 0347  NA 136 137 137 135 137 139 138  K 4.5 4.5 4.5 4.0 4.1 4.0 4.2  CL 105 105 105 103 104 106 103  CO2 24 25 24 23 23 24 24   GLUCOSE  88 101* 103* 127* 120* 116* 139*  BUN 14 13 14 14 14 15 19   CREATININE 2.10* 2.05* 2.03* 1.95* 2.34* 1.74* 2.12*  CALCIUM 7.1* 7.3* 7.7* 7.7* 7.4* 7.5* 7.9*  PHOS 2.5 2.4* 2.5 1.4* 1.6* 1.4* 1.5*   CBC Recent Labs  Lab 06/12/18 0421 06/13/18 0411 06/14/18 0421 06/15/18 0347  WBC 15.4* 17.3* 18.5* 21.3*  HGB 7.7* 9.4* 9.7* 10.1*  HCT 24.3* 30.7* 30.8*  32.2*  MCV 92.4 92.2 90.9 90.4  PLT 154 231 285 373    Medications:    . chlorhexidine gluconate (MEDLINE KIT)  15 mL Mouth Rinse BID  . Chlorhexidine Gluconate Cloth  6 each Topical Q0600  . darbepoetin (ARANESP) injection - NON-DIALYSIS  200 mcg Subcutaneous Q Sun-1800  . lidocaine  1 application Urethral Once  . lidocaine  1 application Urethral Once  . mouth rinse  15 mL Mouth Rinse 10 times per day  . metoCLOPramide (REGLAN) injection  5 mg Intravenous Q6H  . pantoprazole sodium  40 mg Per Tube Q1200  . QUEtiapine  50 mg Oral BID  . sodium chloride flush  10-40 mL Intracatheter Q12H  . THROMBI-PAD  1 each Topical Once      Otelia Santee, MD 06/15/2018, 8:09 AM

## 2018-06-15 NOTE — Progress Notes (Addendum)
Patient ID: Bryan Parker, male   DOB: Jan 06, 1946, 72 y.o.   MRN: [id number]     Advanced Heart Failure Rounding Note  PCP-Cardiologist: Fransico Him, MD   Subjective:    Yesterday dobutamine stopped. Central line removed 10/29. Remains anuric. Remains on CVVH pulling 150 per hour.   Sedated.    Objective:   Weight Range: 67.8 kg Body mass index is 21.45 kg/m.   Vital Signs:   Temp:  [99.1 F (37.3 C)-101.1 F (38.4 C)] 99.7 F (37.6 C) (10/30 0600) Pulse Rate:  [74-139] 99 (10/30 0600) Resp:  [13-31] 14 (10/30 0600) BP: (76-179)/(49-88) 119/62 (10/30 0400) SpO2:  [100 %] 100 % (10/30 0600) Arterial Line BP: (87-179)/(45-79) 151/69 (10/30 0600) FiO2 (%):  [40 %] 40 % (10/30 0400) Weight:  [67.8 kg] 67.8 kg (10/30 0500) Last BM Date: 06/14/18  Weight change: Filed Weights   06/13/18 0424 06/14/18 0415 06/15/18 0500  Weight: 75.5 kg 71.1 kg 67.8 kg    Intake/Output:   Intake/Output Summary (Last 24 hours) at 06/15/2018 0712 Last data filed at 06/15/2018 0700 Gross per 24 hour  Intake 3401.43 ml  Output 7100 ml  Net -3698.57 ml      Physical Exam  General:  Intubated. Sedated. HEENT: normal HEENT: ETT  Neck: supple. JVD 6-7 Carotids 2+ bilat; no bruits. No lymphadenopathy or thryomegaly appreciated. RIJ HD catheter.  Cor: PMI nondisplaced. Regular rate & rhythm. No rubs, gallops or murmurs. Lungs: clear Abdomen: soft, nontender, nondistended. No hepatosplenomegaly. No bruits or masses. Good bowel sounds. Extremities: no cyanosis, clubbing, rash, edema. R and L foot with ischemic changes.  Neuro: sedated  GU: bloody with bladder irrigation.   Telemetry   Sinus Tach 100s   EKG    No new tracings.   Labs    CBC Recent Labs    06/14/18 0421 06/15/18 0347  WBC 18.5* 21.3*  HGB 9.7* 10.1*  HCT 30.8* 32.2*  MCV 90.9 90.4  PLT 285 865   Basic Metabolic Panel Recent Labs    06/14/18 0421 06/14/18 1507 06/15/18 0347  NA 137 139 138  K 4.1 4.0  4.2  CL 104 106 103  CO2 _0 GLUCOSE 120* 116* 139*  BUN _1 CREATININE 2.34* 1.74* 2.12*  CALCIUM 7.4* 7.5* 7.9*  MG 2.3  --  2.5*  PHOS 1.6* 1.4* 1.5*   Liver Function Tests Recent Labs    06/14/18 1507 06/15/18 0347  AST  --  335*  ALT  --  163*  ALKPHOS  --  118  BILITOT  --  5.5*  PROT  --  6.1*  ALBUMIN 1.5* 1.8*   No results for input(s): LIPASE, AMYLASE in the last 72 hours. Cardiac Enzymes Recent Labs    06/13/18 0411 06/14/18 0421  CKTOTAL 19,454* 14,236*    BNP: BNP (last 3 results) Recent Labs    06/11/2018 0854  BNP 18.0    ProBNP (last 3 results) No results for input(s): PROBNP in the last 8760 hours.   D-Dimer No results for input(s): DDIMER in the last 72 hours. Hemoglobin A1C No results for input(s): HGBA1C in the last 72 hours. Fasting Lipid Panel Recent Labs    06/14/18 1248  TRIG 182*   Thyroid Function Tests No results for input(s): TSH, T4TOTAL, T3FREE, THYROIDAB in the last 72 hours.  Invalid input(s): FREET3  Other results:   Imaging    No results found.   Medications:     Scheduled  Medications: . chlorhexidine gluconate (MEDLINE KIT)  15 mL Mouth Rinse BID  . Chlorhexidine Gluconate Cloth  6 each Topical Q0600  . darbepoetin (ARANESP) injection - NON-DIALYSIS  200 mcg Subcutaneous Q Sun-1800  . lidocaine  1 application Urethral Once  . lidocaine  1 application Urethral Once  . mouth rinse  15 mL Mouth Rinse 10 times per day  . metoCLOPramide (REGLAN) injection  5 mg Intravenous Q6H  . pantoprazole sodium  40 mg Per Tube Q1200  . QUEtiapine  50 mg Oral BID  . sodium chloride flush  10-40 mL Intracatheter Q12H  . THROMBI-PAD  1 each Topical Once    Infusions: . sodium chloride Stopped (06/13/18 1012)  . sodium chloride Stopped (06/15/18 0535)  . feeding supplement (VITAL AF 1.2 CAL) 60 mL/hr at 06/14/18 1425  . fentaNYL infusion INTRAVENOUS 200 mcg/hr (06/15/18 0700)  . heparin 1,600 Units/hr  (06/15/18 0700)  . norepinephrine (LEVOPHED) Adult infusion Stopped (06/14/18 0040)  . piperacillin-tazobactam (ZOSYN)  IV 12.5 mL/hr at 06/15/18 0700  . dialysis replacement fluid (prismasate) 300 mL/hr at 06/14/18 2359  . dialysis replacement fluid (prismasate) 350 mL/hr at 06/15/18 0650  . prismasol BGK 4/2.5 1,500 mL/hr at 06/15/18 0555  . propofol (DIPRIVAN) infusion 20 mcg/kg/min (06/15/18 0700)  . sodium chloride    . sodium chloride    . vancomycin      PRN Medications: sodium chloride, acetaminophen (TYLENOL) oral liquid 160 mg/5 mL, fentaNYL (SUBLIMAZE) injection, heparin, hydrALAZINE, Influenza vac split quadrivalent PF, midazolam, sodium chloride, sodium chloride flush, white petrolatum    Patient Profile   Bryan Parker is a 72 y.o. 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  Presented to [facility] after PEA arrest with 10+ minutes down, presumed large PE. Now with RV failure.  Assessment/Plan   1. Shock: Mixed cardiogenic/septic. 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 Also septic shock with very high PCT, fever, and presumed PNA.  Echo 10/2014 showed EF 60%, RV normal, PA peak pressure 73 mmHg.  Echo 06/11/2018 showed LV EF 55-60%, RV severely dilated and dysfunctional, moderate TR, moderately dilated RA, PA peak pressure 31 mmHg.   - Did not place RV impella with extensive bleeding post-TNKase and extensive clot.  - Continue CVVH for volume removal, weight down another 5 pounds.   - Off dobutamine. Central line was removed yesterday. No CO-OX.    2. PEA arrest: Suspect secondary to massive PE.  Received 10+ minutes CPR. Head CT negative.  Hard to know what his neurologic status due to agitation.  3. Presumed massive PE: Has hx of PE in 2016, but has not been on chronic anticoagulation (off >1 year).  Lower extremity dopplers this admission positive for left popliteal acute DVT.  Received TNKase  in ED.  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  -Continue heparin drip.  4. ID: WBC 21.3 Last procalcitonin was 75. Concerned for HCAP, possibly in setting of aspiration (lower lobe infiltrates on CXR).  Cultures remain negative.   - Off vancomycin, continues on Zosyn.  5. AKI on CKD: Baseline creatinine looks to be 1.6-2.2 range (cause of baseline CKD uncertain).  Remains on CVVH, UF 150 cc/hr.  Renal US negative for hydronephrosis.  AKI due to shock with likely contribution from rhabdomyolysis.  - Continue CVVH, needs more volume off.  6. Anemia: Profuse bleeding from OGT and urethra post-TNKase.  He has received multiple units PRBCs, hgb stable 10.1  Stable.   - Urology consulted. Supportive care for now, but recommended further imaging if possible and ?TURP in future. Continues to have hematuria with foley irrigation.   7. Elevated liver enzymes: In setting of shock (ischemic hepatitis).  8. Hypocalcemia: per nephrology. 9. Acute respiratory hypoxemic respiratory failure: Remains intubated, suspect due to combination of PNA and pulmonary edema. Remains intubated. FIO2 40%.   10. Thrombocytopenia:   Suspect fall in plts in setting of sepsis, critical illness/inflammation. Platelets are now norma.  11. Rhabdomyolysis: CK trending down. No role for IVF in setting of ESRD - Per primary. Continue CRRT 12. Ileus: Resolving, TFs going in.   Length of Stay: [place], NP  06/15/2018, 7:12 AM  Advanced Heart Failure Team Pager [phone] (M-F; [place])  Please contact [facility] for night-coverage after hours (4p -7a ) and weekends on [email or web address]  Patient seen with NP, agree with the above note.   He is now off dobutamine.  He remains on CVVH pulling 150 cc/hr and heparin gtt.  He is agitated with no purposeful response this morning.   Still volume overloaded with copious peripheral edema and skin breakdown.   Would  continue UF via CVVH today aggressively, probably 1-2 more days to go.  Hemodynamics appear stable off dobutamine (though no CVL for co-ox).  Will add hydralazine 25 mg tid for elevated BP.   Loralie Champagne 06/15/2018 8:27 AM

## 2018-06-15 NOTE — Progress Notes (Signed)
72 year old man admitted 10/20 post cardiac arrest with PEA received TNKase in the ED for presumed PE due to bedside echo showing dilated RV.  He has a history of prior PE and anticoagulation was discontinued in 2018.  Duplex showed left popliteal DVT He was in severe cor pulmonale on high-dose epinephrine, Levophed, vasopressin and dobutamine.  Course complicated by multiorgan failure.  He came off pressors, required CRRT. Then developed fevers and now back on levo fed.  CT chest/abdomen pelvis was obtained which did not show any evidence of pneumonia or internal bleeding  On exam-remains encephalopathic with intermittent agitation requiring low-dose propofol and fentanyl drips, does remain on pressure support, on CRRT, hypothermic, hematuria with Foley catheter being flushed, soft and nontender abdomen.  Chest x-ray shows stable bibasilar atelectasis, personally reviewed. Labs show mild hyponatremia, leukocytosis.  Impression/plan New shock-discontinue femoral line, placing arterial line, titrate Levophed. Continue empiric antibiotics  Presumed PE/LLE DVT-continue heparin drip  AKI -on CRRT keep equal while on levo fed , check CK intermittently, no clear cause of rhabdomyolysis found  Acute encephalopathy-head CT negative, presumed metabolic Precedex was limited by bradycardia and hypotension increase Seroquel 200 mg twice daily hope to come off propofol and fentanyl to enable weaning  My critical care time  x31m  Cyril Mourning MD. Tonny Bollman. [facility] Pager [phone] If no response call [phone]   06/15/2018

## 2018-06-15 NOTE — Progress Notes (Signed)
ANTICOAGULATION CONSULT NOTE   Pharmacy Consult for Heparin Indication: suspected pulmonary embolus and confirmed DVT  No Known Allergies  Patient Measurements: Height: 5\' 10"  (177.8 cm) Weight: 149 lb 7.6 oz (67.8 kg) IBW/kg (Calculated) : 73  Vital Signs: Temp: 98.4 F (36.9 C) (10/30 2300) Temp Source: Esophageal (10/30 2000) BP: 83/54 (10/30 2245) Pulse Rate: 105 (10/30 2300)  Labs: Recent Labs    06/13/18 0411  06/14/18 0421  06/14/18 1507  06/15/18 0347 06/15/18 1210 06/15/18 1502 06/15/18 2201  HGB 9.4*  --  9.7*  --   --   --  10.1*  --   --   --   HCT 30.7*  --  30.8*  --   --   --  32.2*  --   --   --   PLT 231  --  285  --   --   --  373  --   --   --   HEPARINUNFRC 0.31   < > 0.13*   < >  --    < > 0.21* 0.19*  --  0.25*  CREATININE 2.03*   < > 2.34*  --  1.74*  --  2.12*  --  2.45*  --   CKTOTAL 19,454*  --  14,236*  --   --   --   --   --   --   --    < > = values in this interval not displayed.    Estimated Creatinine Clearance: 26.5 mL/min (A) (by C-G formula based on SCr of 2.45 mg/dL (H)).  Assessment: 72yo male with DVT and likely PE s/p PEA arrest and TNKase 10/20 for heparin. Currently on CRRT with minimal UOP.   Gross hematuria now resolved   Goal of Therapy:  Heparin level 0.3-0.5 units/ml Monitor platelets by anticoagulation protocol: Yes   Plan:  Increase Heparin to 1900 units/hr Daily heparin level and CBC  Thanks for allowing pharmacy to be a part of this patient's care.  Talbert Cage, PharmD 06/15/2018 11:12 PM

## 2018-06-15 NOTE — Progress Notes (Signed)
1st dose of oral contrast given at 1040.

## 2018-06-15 NOTE — Progress Notes (Signed)
RT NOTE: Patient placed back on full support and transported on ventilator from room 2H06 to CT and back to room 2H06 with no complications. Vitals are stable. RT will continue to monitor.

## 2018-06-15 NOTE — Procedures (Signed)
Arterial Catheter Insertion Procedure Note LOYAL HOLZHEIMER [id number] 1945-09-04  Procedure: Insertion of Arterial Catheter  Indications: Blood pressure monitoring  Procedure Details Consent: Unable to obtain consent because of altered level of consciousness. Time Out: Verified patient identification, verified procedure, site/side was marked, verified correct patient position, special equipment/implants available, medications/allergies/relevent history reviewed, required imaging and test results available.  Performed  Maximum sterile technique was used including antiseptics, cap, gloves, gown, hand hygiene, mask and sheet. Skin prep: Chlorhexidine; local anesthetic administered 20 gauge catheter was inserted into right radial artery using the Seldinger technique. ULTRASOUND GUIDANCE USED: NO Evaluation Blood flow good; BP tracing good. Complications: No apparent complications  .   Lanny Lipkin Lajuana Ripple 06/15/2018

## 2018-06-16 ENCOUNTER — Inpatient Hospital Stay (HOSPITAL_COMMUNITY): Payer: Medicare Other

## 2018-06-16 LAB — CBC
HCT: 30 % — ABNORMAL LOW (ref 39.0–52.0)
Hemoglobin: 9.7 g/dL — ABNORMAL LOW (ref 13.0–17.0)
MCH: 28.7 pg (ref 26.0–34.0)
MCHC: 32.3 g/dL (ref 30.0–36.0)
MCV: 88.8 fL (ref 80.0–100.0)
NRBC: 2.4 % — AB (ref 0.0–0.2)
PLATELETS: 388 10*3/uL (ref 150–400)
RBC: 3.38 MIL/uL — AB (ref 4.22–5.81)
RDW: 18.1 % — AB (ref 11.5–15.5)
WBC: 18.6 10*3/uL — AB (ref 4.0–10.5)

## 2018-06-16 LAB — GLUCOSE, CAPILLARY
GLUCOSE-CAPILLARY: 136 mg/dL — AB (ref 70–99)
GLUCOSE-CAPILLARY: 115 mg/dL — AB (ref 70–99)
GLUCOSE-CAPILLARY: 137 mg/dL — AB (ref 70–99)
Glucose-Capillary: 139 mg/dL — ABNORMAL HIGH (ref 70–99)
Glucose-Capillary: 141 mg/dL — ABNORMAL HIGH (ref 70–99)

## 2018-06-16 LAB — RENAL FUNCTION PANEL
ALBUMIN: 1.7 g/dL — AB (ref 3.5–5.0)
ALBUMIN: 1.6 g/dL — AB (ref 3.5–5.0)
ANION GAP: 10 (ref 5–15)
Anion gap: 8 (ref 5–15)
BUN: 26 mg/dL — AB (ref 8–23)
BUN: 29 mg/dL — AB (ref 8–23)
CHLORIDE: 101 mmol/L (ref 98–111)
CO2: 25 mmol/L (ref 22–32)
CO2: 24 mmol/L (ref 22–32)
CREATININE: 2.13 mg/dL — AB (ref 0.61–1.24)
Calcium: 7.8 mg/dL — ABNORMAL LOW (ref 8.9–10.3)
Calcium: 8.1 mg/dL — ABNORMAL LOW (ref 8.9–10.3)
Chloride: 101 mmol/L (ref 98–111)
Creatinine, Ser: 1.89 mg/dL — ABNORMAL HIGH (ref 0.61–1.24)
GFR calc non Af Amer: 34 mL/min — ABNORMAL LOW (ref >60)
GFR, EST AFRICAN AMERICAN: 34 mL/min — AB (ref >60)
GFR, EST AFRICAN AMERICAN: 40 mL/min — AB (ref >60)
GFR, EST NON AFRICAN AMERICAN: 30 mL/min — AB (ref >60)
Glucose, Bld: 136 mg/dL — ABNORMAL HIGH (ref 70–99)
Glucose, Bld: 166 mg/dL — ABNORMAL HIGH (ref 70–99)
PHOSPHORUS: 1.9 mg/dL — AB (ref 2.5–4.6)
PHOSPHORUS: 2.2 mg/dL — AB (ref 2.5–4.6)
POTASSIUM: 4.4 mmol/L (ref 3.5–5.1)
Potassium: 4.2 mmol/L (ref 3.5–5.1)
Sodium: 134 mmol/L — ABNORMAL LOW (ref 135–145)
Sodium: 135 mmol/L (ref 135–145)

## 2018-06-16 LAB — CULTURE, RESPIRATORY W GRAM STAIN

## 2018-06-16 LAB — COOXEMETRY PANEL
Carboxyhemoglobin: 1.6 % — ABNORMAL HIGH (ref 0.5–1.5)
Methemoglobin: 2.1 % — ABNORMAL HIGH (ref 0.0–1.5)
O2 Saturation: 59.7 %
TOTAL HEMOGLOBIN: 10.4 g/dL — AB (ref 12.0–16.0)

## 2018-06-16 LAB — MAGNESIUM: MAGNESIUM: 2.5 mg/dL — AB (ref 1.7–2.4)

## 2018-06-16 LAB — CULTURE, RESPIRATORY

## 2018-06-16 LAB — HEPARIN LEVEL (UNFRACTIONATED): Heparin Unfractionated: 0.31 IU/mL (ref 0.30–0.70)

## 2018-06-16 LAB — CK: Total CK: 7710 U/L — ABNORMAL HIGH (ref 49–397)

## 2018-06-16 MED ORDER — ARTIFICIAL TEARS OPHTHALMIC OINT: TOPICAL_OINTMENT | OPHTHALMIC | Status: DC | PRN

## 2018-06-16 MED ORDER — HYDRALAZINE HCL 25 MG PO TABS
25.0000 mg | ORAL_TABLET | Freq: Three times a day (TID) | ORAL | Status: DC
  Filled 2018-06-16 (×2): qty 1

## 2018-06-16 MED ORDER — QUETIAPINE FUMARATE 100 MG PO TABS
150.0000 mg | ORAL_TABLET | Freq: Two times a day (BID) | ORAL | Status: DC
  Administered 2018-06-16 – 2018-06-20 (×7): 150 mg via ORAL
  Filled 2018-06-16 (×7): qty 1

## 2018-06-16 MED ORDER — NOREPINEPHRINE 16 MG/250ML-% IV SOLN
0.0000 ug/min | INTRAVENOUS | Status: DC
  Administered 2018-06-18: 10 ug/min via INTRAVENOUS
  Administered 2018-06-19: 25 ug/min via INTRAVENOUS
  Administered 2018-06-19: 22 ug/min via INTRAVENOUS
  Administered 2018-06-20: 24 ug/min via INTRAVENOUS
  Administered 2018-06-20: 30 ug/min via INTRAVENOUS
  Administered 2018-06-21: 3 ug/min via INTRAVENOUS
  Filled 2018-06-16 (×6): qty 250

## 2018-06-16 NOTE — Progress Notes (Addendum)
Patient ID: Bryan Parker, male   DOB: [birth year], 72 y.o.   MRN: [id number]     Advanced Heart Failure Rounding Note  PCP-Cardiologist: Fransico Him, MD   Subjective:    Remains on CVVH pulling 125  per hour. Remains intubated.  Yesterday he was hypotensive so Norepi was restarted but this morning norepi was stopped. CO-OX 60%.  CT of head negative.   Ongoing low grade temps. On zosyn and vanc.   Bld Cx - NGTD  Sedated this morning.    Objective:   Weight Range: 66.6 kg Body mass index is 21.07 kg/m.   Vital Signs:   Temp:  [97 F (36.1 C)-101.1 F (38.4 C)] 98.8 F (37.1 C) (10/31 0645) Pulse Rate:  [57-130] 105 (10/31 0645) Resp:  [9-28] 17 (10/31 0645) BP: (59-169)/(41-134) 102/80 (10/31 0645) SpO2:  [95 %-100 %] 100 % (10/31 0645) Arterial Line BP: (75-195)/(35-97) 121/52 (10/31 0645) FiO2 (%):  [40 %] 40 % (10/31 0400) Weight:  [66.6 kg] 66.6 kg (10/31 0346) Last BM Date: (flexi seal in place)  Weight change: Filed Weights   06/14/18 0415 06/15/18 0500 06/16/18 0346  Weight: 71.1 kg 67.8 kg 66.6 kg    Intake/Output:   Intake/Output Summary (Last 24 hours) at 06/16/2018 0748 Last data filed at 06/16/2018 0700 Gross per 24 hour  Intake 3537.28 ml  Output 3740 ml  Net -202.72 ml      Physical Exam  General:  Intubated/Sedated. No resp difficulty HEENT: ETT RIJ HD catheter Neck: supple. JVP 6-7 . Carotids 2+ bilat; no bruits. No lymphadenopathy or thryomegaly appreciated. Cor: PMI nondisplaced. Regular rate & rhythm. No rubs, gallops or murmurs. Lungs: clear Abdomen: soft, nontender, nondistended. No hepatosplenomegaly. No bruits or masses. Good bowel sounds. Extremities: no cyanosis, clubbing, rash, R and LLE 1+ edema. RUE A line Neuro: sedated GU: foley - dark red    Telemetry   Sinus Tach 100s Personally reviewed   EKG    No new tracings.   Labs    CBC Recent Labs    06/15/18 0347 06/16/18 0340  WBC 21.3* 18.6*  HGB 10.1*  9.7*  HCT 32.2* 30.0*  MCV 90.4 88.8  PLT 373 675   Basic Metabolic Panel Recent Labs    06/15/18 0347 06/15/18 1502 06/16/18 0340  NA 138 134* 134*  K 4.2 4.1 4.2  CL 103 99 101  CO2 _0 GLUCOSE 139* 113* 136*  BUN 19 26* 26*  CREATININE 2.12* 2.45* 2.13*  CALCIUM 7.9* 7.7* 7.8*  MG 2.5*  --  2.5*  PHOS 1.5* 1.8* 1.9*   Liver Function Tests Recent Labs    06/15/18 0347 06/15/18 1502 06/16/18 0340  AST 335*  --   --   ALT 163*  --   --   ALKPHOS 118  --   --   BILITOT 5.5*  --   --   PROT 6.1*  --   --   ALBUMIN 1.8* 1.6* 1.7*   No results for input(s): LIPASE, AMYLASE in the last 72 hours. Cardiac Enzymes Recent Labs    06/14/18 0421 06/16/18 0340  CKTOTAL 14,236* 7,710*    BNP: BNP (last 3 results) Recent Labs    06/04/2018 0854  BNP 18.0    ProBNP (last 3 results) No results for input(s): PROBNP in the last 8760 hours.   D-Dimer No results for input(s): DDIMER in the last 72 hours. Hemoglobin A1C No results for input(s): HGBA1C in the last 72 hours.  Fasting Lipid Panel Recent Labs    06/14/18 1248  TRIG 182*   Thyroid Function Tests No results for input(s): TSH, T4TOTAL, T3FREE, THYROIDAB in the last 72 hours.  Invalid input(s): FREET3  Other results:   Imaging    Ct Abdomen Pelvis Wo Contrast  Result Date: 06/15/2018 CLINICAL DATA:  Pneumonia, hematuria. EXAM: CT CHEST, ABDOMEN AND PELVIS WITHOUT CONTRAST TECHNIQUE: Multidetector CT imaging of the chest, abdomen and pelvis was performed following the standard protocol without IV contrast. COMPARISON:  CT scan of October 16, 2014. FINDINGS: CT CHEST FINDINGS Cardiovascular: Atherosclerosis of thoracic aorta is noted without aneurysm formation. Normal cardiac size. No pericardial effusion is noted. Mild coronary artery calcifications are noted. Mediastinum/Nodes: No enlarged mediastinal, hilar, or axillary lymph nodes. Endotracheal and nasogastric tubes are in grossly good position.  Lungs/Pleura: No pneumothorax is noted. Mild bilateral pleural effusions are noted with adjacent subsegmental atelectasis. 7 mm subpleural nodule is seen in superior segment of left lower lobe best seen on image number 94 series 4. Musculoskeletal: No chest wall mass or suspicious bone lesions identified. CT ABDOMEN PELVIS FINDINGS Hepatobiliary: No focal liver abnormality is seen. No gallstones, gallbladder wall thickening, or biliary dilatation. Probable sludge is noted within the gallbladder lumen. Pancreas: Unremarkable. No pancreatic ductal dilatation or surrounding inflammatory changes. Spleen: Normal in size without focal abnormality. Adrenals/Urinary Tract: Adrenal glands and kidneys appear normal. No hydronephrosis or renal obstruction is noted. No renal or ureteral calculi are noted. Urinary bladder is decompressed secondary to Foley catheter. Stomach/Bowel: Stomach is within normal limits. Appendix appears normal. No evidence of bowel wall thickening, distention, or inflammatory changes. Rectal tube is present. Vascular/Lymphatic: Aortic atherosclerosis. No enlarged abdominal or pelvic lymph nodes. Reproductive: Prostate is unremarkable. Other: Mild anasarca is noted.  No hernia is noted. Musculoskeletal: No acute or significant osseous findings. IMPRESSION: Endotracheal and nasogastric tubes in grossly good position. Mild bilateral pleural effusions are noted with adjacent subsegmental atelectasis. 7 mm subpleural nodule seen in superior segment of left lower lobe. Non-contrast chest CT at 6-12 months is recommended. If the nodule is stable at time of repeat CT, then future CT at 18-24 months (from today's scan) is considered optional for low-risk patients, but is recommended for high-risk patients. This recommendation follows the consensus statement: Guidelines for Management of Incidental Pulmonary Nodules Detected on CT Images: From the Fleischner Society 2017; Radiology 2017; 284:228-243. Probable  sludge seen within gallbladder. Mild anasarca. Aortic Atherosclerosis (ICD10-I70.0). Electronically Signed   By: Marijo Conception, M.D.   On: 06/15/2018 13:59   Ct Head Wo Contrast  Result Date: 06/15/2018 CLINICAL DATA:  72 year old male with altered level of consciousness, pneumonia, hematuria EXAM: CT HEAD WITHOUT CONTRAST TECHNIQUE: Contiguous axial images were obtained from the base of the skull through the vertex without intravenous contrast. COMPARISON:  Recent prior head CT 06/15/2018 FINDINGS: Brain: No evidence of acute infarction, hemorrhage, hydrocephalus, extra-axial collection or mass lesion/mass effect. Stable mild atrophy. Vascular: No hyperdense vessel or unexpected calcification. Bilateral cavernous carotid artery calcifications. Skull: Normal. Negative for fracture or focal lesion. Sinuses/Orbits: Partial bilateral mastoid sinuses are effusions. Incompletely imaged mucous retention cysts versus polyps in the inferior maxillary sinuses. Other: None IMPRESSION: 1. No acute intracranial abnormality. 2. Stable mild cortical cerebral atrophy. 3. Mild bilateral partial mastoid effusions. Electronically Signed   By: Jacqulynn Cadet M.D.   On: 06/15/2018 13:24   Ct Chest Wo Contrast  Result Date: 06/15/2018 CLINICAL DATA:  Pneumonia, hematuria. EXAM: CT CHEST, ABDOMEN AND PELVIS WITHOUT  CONTRAST TECHNIQUE: Multidetector CT imaging of the chest, abdomen and pelvis was performed following the standard protocol without IV contrast. COMPARISON:  CT scan of October 16, 2014. FINDINGS: CT CHEST FINDINGS Cardiovascular: Atherosclerosis of thoracic aorta is noted without aneurysm formation. Normal cardiac size. No pericardial effusion is noted. Mild coronary artery calcifications are noted. Mediastinum/Nodes: No enlarged mediastinal, hilar, or axillary lymph nodes. Endotracheal and nasogastric tubes are in grossly good position. Lungs/Pleura: No pneumothorax is noted. Mild bilateral pleural effusions are  noted with adjacent subsegmental atelectasis. 7 mm subpleural nodule is seen in superior segment of left lower lobe best seen on image number 94 series 4. Musculoskeletal: No chest wall mass or suspicious bone lesions identified. CT ABDOMEN PELVIS FINDINGS Hepatobiliary: No focal liver abnormality is seen. No gallstones, gallbladder wall thickening, or biliary dilatation. Probable sludge is noted within the gallbladder lumen. Pancreas: Unremarkable. No pancreatic ductal dilatation or surrounding inflammatory changes. Spleen: Normal in size without focal abnormality. Adrenals/Urinary Tract: Adrenal glands and kidneys appear normal. No hydronephrosis or renal obstruction is noted. No renal or ureteral calculi are noted. Urinary bladder is decompressed secondary to Foley catheter. Stomach/Bowel: Stomach is within normal limits. Appendix appears normal. No evidence of bowel wall thickening, distention, or inflammatory changes. Rectal tube is present. Vascular/Lymphatic: Aortic atherosclerosis. No enlarged abdominal or pelvic lymph nodes. Reproductive: Prostate is unremarkable. Other: Mild anasarca is noted.  No hernia is noted. Musculoskeletal: No acute or significant osseous findings. IMPRESSION: Endotracheal and nasogastric tubes in grossly good position. Mild bilateral pleural effusions are noted with adjacent subsegmental atelectasis. 7 mm subpleural nodule seen in superior segment of left lower lobe. Non-contrast chest CT at 6-12 months is recommended. If the nodule is stable at time of repeat CT, then future CT at 18-24 months (from today's scan) is considered optional for low-risk patients, but is recommended for high-risk patients. This recommendation follows the consensus statement: Guidelines for Management of Incidental Pulmonary Nodules Detected on CT Images: From the Fleischner Society 2017; Radiology 2017; 284:228-243. Probable sludge seen within gallbladder. Mild anasarca. Aortic Atherosclerosis  (ICD10-I70.0). Electronically Signed   By: Marijo Conception, M.D.   On: 06/15/2018 13:59     Medications:     Scheduled Medications: . chlorhexidine gluconate (MEDLINE KIT)  15 mL Mouth Rinse BID  . Chlorhexidine Gluconate Cloth  6 each Topical Q0600  . darbepoetin (ARANESP) injection - NON-DIALYSIS  200 mcg Subcutaneous Q Sun-1800  . mouth rinse  15 mL Mouth Rinse 10 times per day  . metoCLOPramide (REGLAN) injection  5 mg Intravenous Q6H  . pantoprazole sodium  40 mg Per Tube Q1200  . QUEtiapine  100 mg Oral BID  . sodium chloride flush  10-40 mL Intracatheter Q12H  . THROMBI-PAD  1 each Topical Once    Infusions: . sodium chloride Stopped (06/13/18 1012)  . sodium chloride Stopped (06/16/18 0529)  . sodium chloride    . feeding supplement (VITAL AF 1.2 CAL) 1,000 mL (06/15/18 1530)  . fentaNYL infusion INTRAVENOUS 175 mcg/hr (06/16/18 0700)  . heparin 1,900 Units/hr (06/16/18 0700)  . norepinephrine (LEVOPHED) Adult infusion 20 mcg/min (06/16/18 0333)  . piperacillin-tazobactam (ZOSYN)  IV 12.5 mL/hr at 06/16/18 0700  . dialysis replacement fluid (prismasate) 300 mL/hr at 06/15/18 2309  . dialysis replacement fluid (prismasate) 350 mL/hr at 06/16/18 0338  . prismasol BGK 4/2.5 1,500 mL/hr at 06/16/18 0457  . propofol (DIPRIVAN) infusion 25 mcg/kg/min (06/16/18 0700)  . sodium chloride    . sodium chloride    .  vancomycin Stopped (06/15/18 1054)    PRN Medications: sodium chloride, Place/Maintain arterial line **AND** sodium chloride, acetaminophen (TYLENOL) oral liquid 160 mg/5 mL, fentaNYL (SUBLIMAZE) injection, Gerhardt's butt cream, heparin, hydrALAZINE, Influenza vac split quadrivalent PF, midazolam, sodium chloride, sodium chloride flush, white petrolatum    Patient [place] is a 72 y.o. 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  Presented to [facility] after PEA arrest with 10+ minutes down,  presumed large PE. Now with RV failure.  Assessment/Plan   1. Shock: Mixed cardiogenic/septic. 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 Also septic shock with very high PCT, fever, and presumed PNA.  Echo 10/2014 showed EF 60%, RV normal, PA peak pressure 73 mmHg.  Echo 05/20/2018 showed LV EF 55-60%, RV severely dilated and dysfunctional, moderate TR, moderately dilated RA, PA peak pressure 31 mmHg.   - Did not place RV impella with extensive bleeding post-TNKase and extensive clot.  - Continue CVVH for volume removal, weight down another couple of pounds.   - Yesterday he was back on norepi but weaned off today. CO-OX 60%.   - CO-OX 60%  2. PEA arrest: Suspect secondary to massive PE.  Received 10+ minutes CPR. Head CT negative.  CT of head was negative.  3. Presumed massive PE: Has hx of PE in 2016, but has not been on chronic anticoagulation (off >1 year).  Lower extremity dopplers this admission positive for left popliteal acute DVT.  Received TNKase in ED.  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  -Continue heparin drip.  4. ID: WBC trending down 18.6 Last procalcitonin was 75. Concerned for HCAP, possibly in setting of aspiration (lower lobe infiltrates on CXR).  Cultures remain negative.   - On vancomycin and zosyn. .  5. AKI on CKD: Baseline creatinine looks to be 1.6-2.2 range (cause of baseline CKD uncertain).  Remains on CVVH, UF 125 cc/hr.  Renal US negative for hydronephrosis.  AKI due to shock with likely contribution from rhabdomyolysis.  - Continue CVVH, needs more volume off.  6. Anemia: Profuse bleeding from OGT and urethra post-TNKase.  He has received multiple units PRBCs.  Hgb 9.7    - Urology consulted. Supportive care for now, but recommended further imaging if possible and ?TURP in future. Continues to have hematuria with foley irrigation.   7. Elevated liver enzymes: In setting of shock  (ischemic hepatitis).  8. Hypocalcemia: per nephrology. 9. Acute respiratory hypoxemic respiratory failure: Remains intubated, suspect due to combination of PNA and pulmonary edema. Remains intubated. FiO2 40%.  10. Thrombocytopenia:   Suspect fall in plts in setting of sepsis, critical illness/inflammation. Platelets are now normal.  11. Rhabdomyolysis: CK continues to trend down.  No role for IVF in setting of ESRD - Per primary. Continue CRRT 12. Ileus: Resolving, TFs going in.   Length of Stay: Grover, NP  06/16/2018, 7:48 AM  Advanced Heart Failure Team Pager [phone] (M-F; 7a - 4p)  Please contact [facility] for night-coverage after hours (4p -7a ) and weekends on [email or web address]  Agree with above. Remains intubated/sedated. On CVVHD. BP was high initially this am likely due to agitation. Now BP low and back on NE. Dobutamine off. Family discussing tracheostomy. Remains with low grade temps on broad spectrum abx,  On exam  Intubated/sedated ill appearing +ETT RIJ trialysis cath Cor tach regular Ab soft NT Extremities :1+ edema + rectal bag  Continues to be very tenuous with multi-system organ failure. He is hypotensive again today and NE restarted. Co-ox 60% Continues with volume removal with CVVHD. Weight down nearly 50 pounds. Family wanting to proceed with trach. I am worried that even with trach prognosis may be poor.  CRITICAL CARE Performed by: Glori Bickers  Total critical care time: 35 minutes  Critical care time was exclusive of separately billable procedures and treating other patients.  Critical care was necessary to treat or prevent imminent or life-threatening deterioration.  Critical care was time spent personally by me (independent of midlevel providers or residents) on the following activities: development of treatment plan with patient and/or surrogate as well as nursing, discussions with consultants, evaluation of patient's  response to treatment, examination of patient, obtaining history from patient or surrogate, ordering and performing treatments and interventions, ordering and review of laboratory studies, ordering and review of radiographic studies, pulse oximetry and re-evaluation of patient's condition.   Glori Bickers, MD  5:17 PM

## 2018-06-16 NOTE — Progress Notes (Signed)
RN spoke with rounding MD's about removal of coude catheter Dr Vassie Loll with CCM and Dr. Juel Burrow with Nephrology. Both MD's agreed to the removal of the urinary catheter with approval from Urology. RN spoke with Dr. Ronne Binning from Urology and received orders to remove catheter. Will continue to monitor.

## 2018-06-16 NOTE — Progress Notes (Signed)
Paged Dr.Alva about patient having hypotension. Systolic blood pressure fluctuating between upper 80's and 90's. Sedation is currently paused. There are no current orders for any pressors. Orders received to restart Levophed maintaining systolic > 90. MD requested CRRT to be kept even while patient remains on pressors. Will continue to monitor.

## 2018-06-16 NOTE — Progress Notes (Signed)
[facility] Progress Note    Assessment/ Plan:   72 year old BM with CKD- (1.5 to 2.0 in 2016, upon presentation was 2.5) and history of PE who presented with PEA arrest, presumed PE with hemodynamic instability and A on CRF 1.Renal-acute on chronic kidney disease. Not sure of the etiology of his chronic kidney disease. However, his acute is most likely secondary to his multisystem organ failure and prolonged hypotension requiring pressors. He is oliguric now requiring renal replacement therapy. CRRT started 10/23. Running well, no heparin, all 4 k bags. I do not think that rhabdo is the cause of his AKI- is probably from code and being in ICU for several days- there is not an effective way to treat rhabdo in an oliguric pt who is already volume overloaded- just need to let it reabsorb  - continues to req RRT and fortunately tolerating CRRT - had been off UF last night when he was on Levophed but back on at current rate (125-117m/hr) --> still has some edema in thighs but much better. Let's decr net UF to 539mhr and in 48hrs keep even. Wt down to 66.6 kg but bedscale  2. Hypertension/volume- initiallymassively volume overloadedand still w/ significant edema,receiving lots of drips and therefore lots of volume.  3.Hyperkalemia-resolved,continue4 k bags 4. Anemia-situational and multifactorial. Has been transfused this admission-supportive care, per CCM- transfuse as needed- added ESA 5.Metabolic acidosis- improved 6.Hemodynamic instability-multiple pressors with inability to wean. Cardiogenic versus septic shock per cardiology and CCM- some progress 7. Gross hematuria- Renal u/s normal (no hydro) on 10/20, - urology placed cath- achieving good drainage for now, just very little UOP, nurse irrigating.  - Darker tinged urine today  Subjective:   Off Levo and dobutamine.  Tolerating CRRT  Seen on CRRT 4K bath Tolerating 125-150 ml/hr and still  has e/o volume onboard but improved RIJ temp cath Pre/post/qd 300/350/1500 with FF of 10%   Objective:   BP 133/60   Pulse (!) 103   Temp 98.8 F (37.1 C) (Esophageal)   Resp 20   Ht _0  (1.778 m)   Wt 66.6 kg   SpO2 100%   BMI 21.07 kg/m   Intake/Output Summary (Last 24 hours) at 06/16/2018 0847 Last data filed at 06/16/2018 0800 Gross per 24 hour  Intake 3523.6 ml  Output 3692 ml  Net -168.4 ml   Weight change: -1.2 kg  Physical Exam: General: sedated , intubated  Heart:tachy Lungs:clear  Abdomen: distended, no BS Extremities: pitting edemaup to thighs still present but improved Dialysis Access: right IJ temp cath placed 10/23 GU: foley in place with dark red urine  Imaging: Ct Abdomen Pelvis Wo Contrast  Result Date: 06/15/2018 CLINICAL DATA:  Pneumonia, hematuria. EXAM: CT CHEST, ABDOMEN AND PELVIS WITHOUT CONTRAST TECHNIQUE: Multidetector CT imaging of the chest, abdomen and pelvis was performed following the standard protocol without IV contrast. COMPARISON:  CT scan of October 16, 2014. FINDINGS: CT CHEST FINDINGS Cardiovascular: Atherosclerosis of thoracic aorta is noted without aneurysm formation. Normal cardiac size. No pericardial effusion is noted. Mild coronary artery calcifications are noted. Mediastinum/Nodes: No enlarged mediastinal, hilar, or axillary lymph nodes. Endotracheal and nasogastric tubes are in grossly good position. Lungs/Pleura: No pneumothorax is noted. Mild bilateral pleural effusions are noted with adjacent subsegmental atelectasis. 7 mm subpleural nodule is seen in superior segment of left lower lobe best seen on image number 94 series 4. Musculoskeletal: No chest wall mass or suspicious bone lesions identified. CT ABDOMEN PELVIS FINDINGS Hepatobiliary: No  focal liver abnormality is seen. No gallstones, gallbladder wall thickening, or biliary dilatation. Probable sludge is noted within the gallbladder lumen. Pancreas: Unremarkable. No  pancreatic ductal dilatation or surrounding inflammatory changes. Spleen: Normal in size without focal abnormality. Adrenals/Urinary Tract: Adrenal glands and kidneys appear normal. No hydronephrosis or renal obstruction is noted. No renal or ureteral calculi are noted. Urinary bladder is decompressed secondary to Foley catheter. Stomach/Bowel: Stomach is within normal limits. Appendix appears normal. No evidence of bowel wall thickening, distention, or inflammatory changes. Rectal tube is present. Vascular/Lymphatic: Aortic atherosclerosis. No enlarged abdominal or pelvic lymph nodes. Reproductive: Prostate is unremarkable. Other: Mild anasarca is noted.  No hernia is noted. Musculoskeletal: No acute or significant osseous findings. IMPRESSION: Endotracheal and nasogastric tubes in grossly good position. Mild bilateral pleural effusions are noted with adjacent subsegmental atelectasis. 7 mm subpleural nodule seen in superior segment of left lower lobe. Non-contrast chest CT at 6-12 months is recommended. If the nodule is stable at time of repeat CT, then future CT at 18-24 months (from today's scan) is considered optional for low-risk patients, but is recommended for high-risk patients. This recommendation follows the consensus statement: Guidelines for Management of Incidental Pulmonary Nodules Detected on CT Images: From the Fleischner Society 2017; Radiology 2017; 284:228-243. Probable sludge seen within gallbladder. Mild anasarca. Aortic Atherosclerosis (ICD10-I70.0). Electronically Signed   By: Marijo Conception, M.D.   On: 06/15/2018 13:59   Ct Head Wo Contrast  Result Date: 06/15/2018 CLINICAL DATA:  72 year old male with altered level of consciousness, pneumonia, hematuria EXAM: CT HEAD WITHOUT CONTRAST TECHNIQUE: Contiguous axial images were obtained from the base of the skull through the vertex without intravenous contrast. COMPARISON:  Recent prior head CT 05/17/2018 FINDINGS: Brain: No evidence of  acute infarction, hemorrhage, hydrocephalus, extra-axial collection or mass lesion/mass effect. Stable mild atrophy. Vascular: No hyperdense vessel or unexpected calcification. Bilateral cavernous carotid artery calcifications. Skull: Normal. Negative for fracture or focal lesion. Sinuses/Orbits: Partial bilateral mastoid sinuses are effusions. Incompletely imaged mucous retention cysts versus polyps in the inferior maxillary sinuses. Other: None IMPRESSION: 1. No acute intracranial abnormality. 2. Stable mild cortical cerebral atrophy. 3. Mild bilateral partial mastoid effusions. Electronically Signed   By: Jacqulynn Cadet M.D.   On: 06/15/2018 13:24   Ct Chest Wo Contrast  Result Date: 06/15/2018 CLINICAL DATA:  Pneumonia, hematuria. EXAM: CT CHEST, ABDOMEN AND PELVIS WITHOUT CONTRAST TECHNIQUE: Multidetector CT imaging of the chest, abdomen and pelvis was performed following the standard protocol without IV contrast. COMPARISON:  CT scan of October 16, 2014. FINDINGS: CT CHEST FINDINGS Cardiovascular: Atherosclerosis of thoracic aorta is noted without aneurysm formation. Normal cardiac size. No pericardial effusion is noted. Mild coronary artery calcifications are noted. Mediastinum/Nodes: No enlarged mediastinal, hilar, or axillary lymph nodes. Endotracheal and nasogastric tubes are in grossly good position. Lungs/Pleura: No pneumothorax is noted. Mild bilateral pleural effusions are noted with adjacent subsegmental atelectasis. 7 mm subpleural nodule is seen in superior segment of left lower lobe best seen on image number 94 series 4. Musculoskeletal: No chest wall mass or suspicious bone lesions identified. CT ABDOMEN PELVIS FINDINGS Hepatobiliary: No focal liver abnormality is seen. No gallstones, gallbladder wall thickening, or biliary dilatation. Probable sludge is noted within the gallbladder lumen. Pancreas: Unremarkable. No pancreatic ductal dilatation or surrounding inflammatory changes. Spleen:  Normal in size without focal abnormality. Adrenals/Urinary Tract: Adrenal glands and kidneys appear normal. No hydronephrosis or renal obstruction is noted. No renal or ureteral calculi are noted. Urinary bladder is decompressed  secondary to Foley catheter. Stomach/Bowel: Stomach is within normal limits. Appendix appears normal. No evidence of bowel wall thickening, distention, or inflammatory changes. Rectal tube is present. Vascular/Lymphatic: Aortic atherosclerosis. No enlarged abdominal or pelvic lymph nodes. Reproductive: Prostate is unremarkable. Other: Mild anasarca is noted.  No hernia is noted. Musculoskeletal: No acute or significant osseous findings. IMPRESSION: Endotracheal and nasogastric tubes in grossly good position. Mild bilateral pleural effusions are noted with adjacent subsegmental atelectasis. 7 mm subpleural nodule seen in superior segment of left lower lobe. Non-contrast chest CT at 6-12 months is recommended. If the nodule is stable at time of repeat CT, then future CT at 18-24 months (from today's scan) is considered optional for low-risk patients, but is recommended for high-risk patients. This recommendation follows the consensus statement: Guidelines for Management of Incidental Pulmonary Nodules Detected on CT Images: From the Fleischner Society 2017; Radiology 2017; 284:228-243. Probable sludge seen within gallbladder. Mild anasarca. Aortic Atherosclerosis (ICD10-I70.0). Electronically Signed   By: Marijo Conception, M.D.   On: 06/15/2018 13:59   Dg Chest Port 1 View  Result Date: 06/15/2018 CLINICAL DATA:  Respiratory failure.  Intubated patient. EXAM: PORTABLE CHEST 1 VIEW COMPARISON:  06/14/2018 and 06/13/2018. FINDINGS: 0556 hours. Endotracheal tube, enteric tube and right IJ central venous catheter appear unchanged in position. There is a possible mid esophageal temperature probe. The heart size and mediastinal contours are stable. There is stable mild left-greater-than-right  basilar atelectasis. No pneumothorax or significant pleural effusion. IMPRESSION: Stable bibasilar atelectasis and support system.  No acute findings. Electronically Signed   By: Richardean Sale M.D.   On: 06/15/2018 10:50    Labs: BMET Recent Labs  Lab 06/13/18 0411 06/13/18 2056 06/14/18 0421 06/14/18 1507 06/15/18 0347 06/15/18 1502 06/16/18 0340  NA 137 135 137 139 138 134* 134*  K 4.5 4.0 4.1 4.0 4.2 4.1 4.2  CL 105 103 104 106 103 99 101  CO2 _0 GLUCOSE 103* 127* 120* 116* 139* 113* 136*  BUN _1 26* 26*  CREATININE 2.03* 1.95* 2.34* 1.74* 2.12* 2.45* 2.13*  CALCIUM 7.7* 7.7* 7.4* 7.5* 7.9* 7.7* 7.8*  PHOS 2.5 1.4* 1.6* 1.4* 1.5* 1.8* 1.9*   CBC Recent Labs  Lab 06/13/18 0411 06/14/18 0421 06/15/18 0347 06/16/18 0340  WBC 17.3* 18.5* 21.3* 18.6*  HGB 9.4* 9.7* 10.1* 9.7*  HCT 30.7* 30.8* 32.2* 30.0*  MCV 92.2 90.9 90.4 88.8  PLT 231 285 373 388    Medications:    . chlorhexidine gluconate (MEDLINE KIT)  15 mL Mouth Rinse BID  . Chlorhexidine Gluconate Cloth  6 each Topical Q0600  . darbepoetin (ARANESP) injection - NON-DIALYSIS  200 mcg Subcutaneous Q Sun-1800  . mouth rinse  15 mL Mouth Rinse 10 times per day  . metoCLOPramide (REGLAN) injection  5 mg Intravenous Q6H  . pantoprazole sodium  40 mg Per Tube Q1200  . QUEtiapine  100 mg Oral BID  . sodium chloride flush  10-40 mL Intracatheter Q12H  . THROMBI-PAD  1 each Topical Once      Otelia Santee, MD 06/16/2018, 8:47 AM

## 2018-06-16 NOTE — Progress Notes (Signed)
ANTICOAGULATION CONSULT NOTE   Pharmacy Consult for Heparin Indication: suspected pulmonary embolus and confirmed DVT  No Known Allergies  Patient Measurements: Height: 5\' 10"  (177.8 cm) Weight: 146 lb 13.2 oz (66.6 kg) IBW/kg (Calculated) : 73  Vital Signs: Temp: 99 F (37.2 C) (10/31 0545) Temp Source: Esophageal (10/31 0400) BP: 91/72 (10/31 0545) Pulse Rate: 106 (10/31 0545)  Labs: Recent Labs    06/14/18 0421  06/15/18 0347 06/15/18 1210 06/15/18 1502 06/15/18 2201 06/16/18 0340  HGB 9.7*  --  10.1*  --   --   --  9.7*  HCT 30.8*  --  32.2*  --   --   --  30.0*  PLT 285  --  373  --   --   --  388  HEPARINUNFRC 0.13*   < > 0.21* 0.19*  --  0.25* 0.31  CREATININE 2.34*   < > 2.12*  --  2.45*  --  2.13*  CKTOTAL 14,236*  --   --   --   --   --  7,710*   < > = values in this interval not displayed.    Estimated Creatinine Clearance: 30 mL/min (A) (by C-G formula based on SCr of 2.13 mg/dL (H)).  Assessment: 72yo male with DVT and likely PE s/p PEA arrest and TNKase 10/20 for heparin. Currently on CRRT with minimal UOP.   Gross hematuria now resolved and anemia s/p pRBC 10/25.   10/31: This morning heparin level now at the lower end of therapeutic at 0.31 on heparin 1900 units/hr with rate increase last night. Platelets stable, Hgb continues to be stable, and hematuria resolving. Per RN, no infusion issues noted.   Goal of Therapy:  Heparin level 0.3-0.5 units/ml Monitor platelets by anticoagulation protocol: Yes   Plan:  Increase Heparin to 1950 units/hr to ensure within therapeutic range Daily heparin level and CBC  Thanks for allowing pharmacy to be a part of this patient's care.  Wendelyn Breslow, PharmD PGY1 Pharmacy Resident Phone: [phone] 06/16/2018 6:26 AM

## 2018-06-16 NOTE — Progress Notes (Signed)
Heart Failure team paged about patient having recurrent hypertension. RN spoke with Tonye Becket NP. Orders received to restarted hydralazine per tube. Will continue to monitor.

## 2018-06-16 NOTE — Progress Notes (Signed)
NAME:  Bryan Parker, MRN:  [id number], DOB:  09-01-1945, LOS: 11 ADMISSION DATE:  06-15-18, CONSULTATION DATE:  June 15, 2018 REFERRING MD:  Dr. Rhunette Croft , CHIEF COMPLAINT:  Cardiac Arrest    Brief History   72 year old male presents to ED s/p Cardiac Arrest. Per Report patient called EMS for Abdominal Pain/Nausea/Vomiting. When EMS arrived patient was alert however went pulseless and PEA while on scene. ROSC achieved after 2 EPI and 10 minutes CPR. On arrival patient continued to be unstable and required multiple rounds of CPR. Fast exam negative. Bedside ECHO revealed slightly dilated RV. ABG 6.914/69.5/368. Progressive hypotension requiring Levophed/EPI/Vasopresin and 9 Bicarb pushes.    Wife reports that patient was complaining of severe left calf pain all day 10/19. Has a history of PE 2016. Was taken off anticoagulation 2018. Due to high suspicion of PE TNKase was given in ED. PCCM asked to admit.   Past Medical History  H/O DVT/PE, CKD  Significant Hospital Events   10/20 > Presents to ED  1021 with a partial code no CPR 10/22 bleeding from IV sites and foley, heparin d/c'd for 2 hours then resumed. Hgb 6.8 > 1u PRBC transfused 10/23 CRRT started.  Giapreza added. 10/24 coude catheter placed by urology and CBI started. 10/25 - Pressor requirements much improved after giaprezza added 10/23; however, remains on vaso0.03, NE 23, epi 10, dobut 20, giapreza 25. CK up from 14.5 on 10/24 up to 35K on 10/25.  CRRT running.  10/26 - CK 31,000 (was 34,000 yesterday) . Volume removal continues. Fluid challenge strategy cancelled. CK better. Legs thinner with volume removal. Pressors: off giaprezza, levophed 6, dobutamine 10, epi 16 and vaso 0.03. Had vomit yesterday and TF on hold. Has bloody urine - seen by urology  10/27  - pressor needs coming down - off epi gtt. Dobutamine 10, Vaso 0.03 and levophed 4-69mcg/min. . Still largely volume . Volume removal via CRRT. CK down to 24K. TF still on hold.   10/28 brady , hypotensive with precedex   Consults: date of consult/date signed off & final recs:  PCCM 10/20 Heart failure 10/21 >  Urology 10/24 >  renal  Procedures (surgical and bedside):  ETT 10/20 >> Right Femoral CVC 10/20 >> 10/29 Left Femoral Aline 10/20 >> 10/30 L IJ HD cath 10/23 >  Rt radial a line 10/30 >>  Significant Diagnostic Tests:  CT Head 10/20 >> neg Echo 10/20 > EF 55 - 60%, severely dilated RV, mod dilated RA, mod TR. CT chest/abd/ pelvis 10/30  >> no bleed CT head 10/30 neg  Micro Data:  Blood 10/20 >> neg Sputum 10/20 >> neg U/A 10/20 >> neg Blood 10/29 > ng resp 10/29  >>  Antimicrobials:  zosyn 10/20 >> vanc 10/20 >>off, 10/29 >>  SUBJECTIVE/OVERNIGHT/INTERVAL HX   Defervesced Sedated on propoofol/ fent gtt Critically ill, on CRRT, on vent   Objective   Blood pressure 133/60, pulse (!) 103, temperature 98.8 F (37.1 C), temperature source Esophageal, resp. rate 20, height 5\' 10"  (1.778 m), weight 66.6 kg, SpO2 100 %.    Vent Mode: PRVC FiO2 (%):  [40 %] 40 % Set Rate:  [14 bmp] 14 bmp Vt Set:  [500 mL] 500 mL PEEP:  [5 cmH20] 5 cmH20 Pressure Support:  [15 cmH20] 15 cmH20 Plateau Pressure:  [8 cmH20-16 cmH20] 15 cmH20   Intake/Output Summary (Last 24 hours) at 06/16/2018 0906 Last data filed at 06/16/2018 0900 Gross per 24 hour  Intake 3538.15 ml  Output 3424 ml  Net 114.15 ml   Filed Weights   06/14/18 0415 06/15/18 0500 06/16/18 0346  Weight: 71.1 kg 67.8 kg 66.6 kg    Chronic critically ill, sedated on propofol and fentanyl Icterus present, mild pallor Decreased breath sounds bilateral Soft and nontender abdomen Blood in Foley catheter Does not follow commands, RA SS -2 Cool extremities, dusky toes  Chest x-ray personally reviewed shows ET tube in good position, clearing of bibasilar infiltrates       Assessment & Plan:   #acute resp failure - s/p intubation in ED.  PLAN -Proceed with spontaneous  breathing trials,  limited by encephalopathy and deconditioning   #PEA Cardiac Arrest - concern due to massive PE.   Shock - likely mixed, obstructive due to above and likely septic/HCAP vs aspiration.given PCT > 150. - resolved New shock 10/29 likely due to sepsis - resolved plan - Cardiology following.  #Presumed PE - s/p TPA in ED. LLE DVT. Hx PE / DVT 2016 (no longer on anticoagulation).  Plan - Continue heparin gtt. - Monitor CBC and bleeding  Foley, continue to irrigate Foley  # New fever 10/29  -all femoral lines dc'd & defervesced, WBC trending down, CT non diagnostic - dc zosyn , ct vanc until 11/2 -await resp cx  #AKI on Chronic Kidney Failure - likely exacerbated by arrest and rhabdo.  Now on CRRT. #Rhabdomyolysis - CK peaked at 35K on 10/25 now down to [facility]  - Nephrology following - CRRT - Follow CK  intermittent    #Acute blood loss anemia - had bleeding from IV sites and foley - about 5-6 U PRBC so far  PLAN - Maintain Hgb > 8 following PEA arrest.   Urethral bleeding - likely traumatic following tPA administration. - Urology following, doubt need for cystoscopy. - getting daily irrigation   #Thrombocytopenia.-  resolved   #Hyperglycemia. - Continue SSI.  #Acute Encephalopathy - metabolic + from PEA arrest. -Resume Precedex -limited by bradycardia and hypotension,  -goal RASS 0 to -1 -Start Seroquel 50 twice daily  #Vomit 10/25/19KUB - no ileus PLAN  -Titrate TF to goal   Disposition / Summary of Today's Plan 06/16/18    Full ICU care  Code Status: 06/07/2018 partial code no CPR Family Communication: updated wife and daughter at bedside  Would like to minimize sedation and proceed with spontaneous breathing today to see if he is extubatable.  If not over the weekend,then consider tracheostomy.  Discussed with wife and she would be agreeable to proceed   ATTESTATION & SIGNATURE   The patient is critically ill with multiple organ systems  failure and requires high complexity decision making for assessment and support, frequent evaluation and titration of therapies, application of advanced monitoring technologies and extensive interpretation of multiple databases. Critical Care Time devoted to patient care services described in this note independent of APP/resident  time is 35 minutes.   Cyril Mourning MD. Tonny Bollman. [facility] Pager [phone] If no response call [phone]    06/16/2018

## 2018-06-17 DIAGNOSIS — N179 Acute kidney failure, unspecified: Secondary | ICD-10-CM

## 2018-06-17 LAB — GLUCOSE, CAPILLARY
GLUCOSE-CAPILLARY: 154 mg/dL — AB (ref 70–99)
GLUCOSE-CAPILLARY: 135 mg/dL — AB (ref 70–99)
Glucose-Capillary: 120 mg/dL — ABNORMAL HIGH (ref 70–99)
Glucose-Capillary: 122 mg/dL — ABNORMAL HIGH (ref 70–99)
Glucose-Capillary: 134 mg/dL — ABNORMAL HIGH (ref 70–99)
Glucose-Capillary: 144 mg/dL — ABNORMAL HIGH (ref 70–99)
Glucose-Capillary: 156 mg/dL — ABNORMAL HIGH (ref 70–99)

## 2018-06-17 LAB — MAGNESIUM: MAGNESIUM: 2.7 mg/dL — AB (ref 1.7–2.4)

## 2018-06-17 LAB — CBC
HEMATOCRIT: 29.8 % — AB (ref 39.0–52.0)
HEMOGLOBIN: 9.4 g/dL — AB (ref 13.0–17.0)
MCH: 28 pg (ref 26.0–34.0)
MCHC: 31.5 g/dL (ref 30.0–36.0)
MCV: 88.7 fL (ref 80.0–100.0)
NRBC: 2 % — AB (ref 0.0–0.2)
Platelets: 496 10*3/uL — ABNORMAL HIGH (ref 150–400)
RBC: 3.36 MIL/uL — ABNORMAL LOW (ref 4.22–5.81)
RDW: 18.2 % — ABNORMAL HIGH (ref 11.5–15.5)
WBC: 21.7 10*3/uL — AB (ref 4.0–10.5)

## 2018-06-17 LAB — TRIGLYCERIDES: TRIGLYCERIDES: 372 mg/dL — AB (ref <150)

## 2018-06-17 LAB — RENAL FUNCTION PANEL
ALBUMIN: 1.6 g/dL — AB (ref 3.5–5.0)
ALBUMIN: 1.7 g/dL — AB (ref 3.5–5.0)
ANION GAP: 9 (ref 5–15)
Anion gap: 10 (ref 5–15)
BUN: 30 mg/dL — ABNORMAL HIGH (ref 8–23)
BUN: 30 mg/dL — AB (ref 8–23)
CALCIUM: 8.3 mg/dL — AB (ref 8.9–10.3)
CHLORIDE: 102 mmol/L (ref 98–111)
CHLORIDE: 100 mmol/L (ref 98–111)
CO2: 24 mmol/L (ref 22–32)
CO2: 26 mmol/L (ref 22–32)
CREATININE: 1.6 mg/dL — AB (ref 0.61–1.24)
Calcium: 7.8 mg/dL — ABNORMAL LOW (ref 8.9–10.3)
Creatinine, Ser: 1.74 mg/dL — ABNORMAL HIGH (ref 0.61–1.24)
GFR calc Af Amer: 48 mL/min — ABNORMAL LOW (ref >60)
GFR, EST AFRICAN AMERICAN: 44 mL/min — AB (ref >60)
GFR, EST NON AFRICAN AMERICAN: 38 mL/min — AB (ref >60)
GFR, EST NON AFRICAN AMERICAN: 42 mL/min — AB (ref >60)
Glucose, Bld: 137 mg/dL — ABNORMAL HIGH (ref 70–99)
Glucose, Bld: 144 mg/dL — ABNORMAL HIGH (ref 70–99)
PHOSPHORUS: 2 mg/dL — AB (ref 2.5–4.6)
PHOSPHORUS: 2.3 mg/dL — AB (ref 2.5–4.6)
Potassium: 4.2 mmol/L (ref 3.5–5.1)
Potassium: 4.6 mmol/L (ref 3.5–5.1)
SODIUM: 136 mmol/L (ref 135–145)
Sodium: 135 mmol/L (ref 135–145)

## 2018-06-17 LAB — HEPARIN LEVEL (UNFRACTIONATED): Heparin Unfractionated: 0.48 IU/mL (ref 0.30–0.70)

## 2018-06-17 LAB — PROCALCITONIN: Procalcitonin: 18.81 ng/mL

## 2018-06-17 MED ORDER — PROPOFOL 500 MG/50ML IV EMUL
5.0000 ug/kg/min | Freq: Once | INTRAVENOUS | Status: AC
  Administered 2018-06-17: 20 ug/kg/min via INTRAVENOUS
  Filled 2018-06-17: qty 50

## 2018-06-17 MED ORDER — ETOMIDATE 2 MG/ML IV SOLN
40.0000 mg | Freq: Once | INTRAVENOUS | Status: AC
  Administered 2018-06-18: 20 mg via INTRAVENOUS
  Filled 2018-06-17: qty 20

## 2018-06-17 MED ORDER — VITAL AF 1.2 CAL PO LIQD
1000.0000 mL | ORAL | Status: DC
  Administered 2018-06-18 – 2018-06-19 (×2): 1000 mL

## 2018-06-17 MED ORDER — MIDAZOLAM HCL 2 MG/2ML IJ SOLN
4.0000 mg | Freq: Once | INTRAMUSCULAR | Status: AC
  Administered 2018-06-18: 2 mg via INTRAVENOUS
  Filled 2018-06-17: qty 4

## 2018-06-17 MED ORDER — VECURONIUM BROMIDE 10 MG IV SOLR
10.0000 mg | Freq: Once | INTRAVENOUS | Status: AC
  Administered 2018-06-18: 10 mg via INTRAVENOUS
  Filled 2018-06-17: qty 10

## 2018-06-17 MED ORDER — FENTANYL CITRATE (PF) 100 MCG/2ML IJ SOLN
200.0000 ug | Freq: Once | INTRAMUSCULAR | Status: AC
  Administered 2018-06-18: 100 ug via INTRAVENOUS
  Filled 2018-06-17: qty 4

## 2018-06-17 NOTE — Progress Notes (Signed)
D/c tube feeds and heparin gtt at midnight 11/2 in preparation for tracheostomy in the morning. Per ALva MD.

## 2018-06-17 NOTE — Progress Notes (Signed)
NAME:  Bryan Parker, MRN:  [id number], DOB:  11/21/1945, LOS: 12 ADMISSION DATE:  June 20, 2018, CONSULTATION DATE:  06-20-18 REFERRING MD:  Dr. Rhunette Croft , CHIEF COMPLAINT:  Cardiac Arrest    Brief History   72 year old male presents to ED s/p Cardiac Arrest. Per Report patient called EMS for Abdominal Pain/Nausea/Vomiting. When EMS arrived patient was alert however went pulseless and PEA while on scene. ROSC achieved after 2 EPI and 10 minutes CPR. On arrival patient continued to be unstable and required multiple rounds of CPR. Fast exam negative. Bedside ECHO revealed slightly dilated RV. ABG 6.914/69.5/368. Progressive hypotension requiring Levophed/EPI/Vasopresin and 9 Bicarb pushes.    Wife reports that patient was complaining of severe left calf pain all day 10/19. Has a history of PE 2016. Was taken off anticoagulation 2018. Due to high suspicion of PE TNKase was given in ED. PCCM asked to admit.   Past Medical History  H/O DVT/PE, CKD  Significant Hospital Events   10/20 > Presents to ED  1021 with a partial code no CPR 10/22 bleeding from IV sites and foley, heparin d/c'd for 2 hours then resumed. Hgb 6.8 > 1u PRBC transfused 10/23 CRRT started.  Giapreza added. 10/24 coude catheter placed by urology and CBI started. 10/25 - Pressor requirements much improved after giaprezza added 10/23; however, remains on vaso0.03, NE 23, epi 10, dobut 20, giapreza 25. CK up from 14.5 on 10/24 up to 35K on 10/25.  CRRT running.  10/26 - CK 31,000 (was 34,000 yesterday) . Volume removal continues. Fluid challenge strategy cancelled. CK better. Legs thinner with volume removal. Pressors: off giaprezza, levophed 6, dobutamine 10, epi 16 and vaso 0.03. Had vomit yesterday and TF on hold. Has bloody urine - seen by urology  10/27  - pressor needs coming down - off epi gtt. Dobutamine 10, Vaso 0.03 and levophed 4-31mcg/min. . Still largely volume . Volume removal via CRRT. CK down to 24K. TF still on hold.   10/28 brady , hypotensive with precedex   Consults: date of consult/date signed off & final recs:  PCCM 10/20 Heart failure 10/21 >  Urology 10/24 >  renal  Procedures (surgical and bedside):  ETT 10/20 >> Right Femoral CVC 10/20 >> 10/29 Left Femoral Aline 10/20 >> 10/30 L IJ HD cath 10/23 >  Rt radial a line 10/30 >>  Significant Diagnostic Tests:  CT Head 10/20 >> neg Echo 10/20 > EF 55 - 60%, severely dilated RV, mod dilated RA, mod TR. CT chest/abd/ pelvis 10/30  >> no bleed CT head 10/30 neg  Micro Data:  Blood 10/20 >> neg Sputum 10/20 >> neg U/A 10/20 >> neg Blood 10/29 > ng resp 10/29  >>ng  Antimicrobials:  zosyn 10/20 >> vanc 10/20 >>off, 10/29 >>  SUBJECTIVE/OVERNIGHT/INTERVAL HX   Afebrile Critically ill, intubated on propofol + fent gtt On CRRT  Objective   Blood pressure 123/84, pulse (!) 115, temperature 98.2 F (36.8 C), resp. rate (!) 34, height 5\' 10"  (1.778 m), weight 65 kg, SpO2 100 %.    Vent Mode: PRVC FiO2 (%):  [40 %] 40 % Set Rate:  [14 bmp] 14 bmp Vt Set:  [500 mL] 500 mL PEEP:  [5 cmH20] 5 cmH20 Pressure Support:  [5 cmH20] 5 cmH20 Plateau Pressure:  [15 cmH20-18 cmH20] 17 cmH20   Intake/Output Summary (Last 24 hours) at 06/17/2018 0955 Last data filed at 06/17/2018 0800 Gross per 24 hour  Intake 2928.99 ml  Output 4094 ml  Net -1165.01 ml   Filed Weights   06/15/18 0500 06/16/18 0346 06/17/18 0500  Weight: 67.8 kg 66.6 kg 65 kg    Chronic critically ill, agitated on WUA per RN Icterus +, mild pallor Decreased breath sounds bilateral Soft and nontender abdomen Blood in Foley catheter Does not follow commands, RA SS -2 Cool extremities, dusky toes & feet   Chest x-ray personally reviewed which shows clearing of infiltrates except at left base       Assessment & Plan:   #acute resp failure - s/p intubation in ED.  PLAN -Spontaneous breathing trials have been limited by encephalopathy and deconditioning  If  we are to proceed forward, then will need tracheostomy  #PEA Cardiac Arrest - concern due to massive PE.   Shock - likely mixed, obstructive due to above and likely septic/HCAP vs aspiration.given PCT > 150. - resolved New shock 10/29 likely due to sepsis - resolved plan - Cardiology following  #Presumed PE - s/p TPA in ED. LLE DVT. Hx PE / DVT 2016 (no longer on anticoagulation).  Plan - Continue heparin gtt. - Monitor CBC and bleeding  Foley, continue to irrigate Foley  # New fever 10/29, WBC rising again -all femoral lines dc'd & defervesced,CT non diagnostic - dc zosyn , ct vanc until 11/2, cultures are negative, repeat procalcitonin   #AKI on Chronic Kidney Failure - likely exacerbated by arrest and rhabdo.  Now on CRRT. #Rhabdomyolysis - CK peaked at 35K on 10/25 now down to [facility]  - Nephrology following - CRRT, and transition to intermittent dialysis now that off pressors - Follow CK  intermittent    #Acute blood loss anemia - had bleeding from IV sites and foley - about 5-6 U PRBC so far  PLAN - Maintain Hgb > 8 following PEA arrest.   Urethral bleeding - likely traumatic following tPA administration. - Urology following, doubt need for cystoscopy. - getting daily irrigation    #Hyperglycemia. - Continue SSI.  #Acute Encephalopathy - metabolic + from PEA arrest. -Precedex was stopped due to bradycardia and hypotension -ct propofol + fent gtt -goal RASS 0 to -1 -Increased Seroquel 150 twice daily  #Vomit 10/25/19KUB - no ileus PLAN  -TF @ goal  #Thrombocytopenia.-  resolved   Disposition / Summary of Today's Plan 06/17/18   Unfortunately have not made much progress with spontaneous breathing trials due to severe deconditioning and encephalopathy.  We will discuss tracheostomy with family. 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  Also there is no renal recovery so seems like he will need  longer-term dialysis. Overall prognosis is guarded   Code Status: 06/07/2018 partial code no CPR Family Communication: updated wife and daughter at bedside     ATTESTATION & SIGNATURE   The patient is critically ill with multiple organ systems failure and requires high complexity decision making for assessment and support, frequent evaluation and titration of therapies, application of advanced monitoring technologies and extensive interpretation of multiple databases. Critical Care Time devoted to patient care services described in this note independent of APP/resident  time is 33 minutes.   Cyril Mourning MD. Tonny Bollman.  Pulmonary & Critical care Pager [phone] If no response call [phone]   06/17/2018     06/17/2018

## 2018-06-17 NOTE — Progress Notes (Signed)
Nutrition Follow Up  DOCUMENTATION CODES:   Not applicable  INTERVENTION:    Decrease Vital AF 1.2 to goal rate of 55 ml/h (1320 ml per day)   TF + Propofol provides 1809 kcals, 99 gm protein, 1070 ml free water daily  NUTRITION DIAGNOSIS:   Inadequate oral intake related to inability to eat as evidenced by NPO status, ongoing  GOAL:   Patient will meet greater than or equal to 90% of their needs, progressing  MONITOR:   Vent status, TF tolerance, Labs, Skin, Weight trends, I & O's  ASSESSMENT:   72 y.o. male with hx of HTN, CKD, lg PE 2016 and no longer anticoagulated. He was admitted 10/20 after PEA cardiac arrest. Coded repeatedly in the ER. High suspicion of PE, TNKase given in ED.    10/23 CVVHD started 10/25 vomiting, TF held  Patient is currently intubated on ventilator support MV: 13.2 L/min Temp (24hrs), Avg:98.3 F (36.8 C), Min:96.4 F (35.8 C), Max:99.9 F (37.7 C)  Propofol: 8.53 ml/hr >> 225 fat kcals  Vital AF 1.2 formula infusing at goal rate of 60 ml/hr via OGT. CCM note reviewed. SBTs limited due to pt's agitation/mentation. Cardiogenic shock resolved. Plan to discuss trach placement with family.  Acute on CKD. Volume improved. Nephrology note reviewed. Plan to stop CVVHD tomorrow and try iHD Monday, 11/4. Labs reviewed. CBG's [id number].  Diet Order:   Diet Order            Diet NPO time specified  Diet effective midnight        Diet NPO time specified  Diet effective now             EDUCATION NEEDS:   Not appropriate for education at this time  Skin:  Skin Assessment: Skin Integrity Issues: Skin Integrity Issues:: DTI DTI: bilateral heels Other: gangrene of toes  Last BM:  11/1 - flexiseal    Intake/Output Summary (Last 24 hours) at 06/17/2018 1437 Last data filed at 06/17/2018 1300 Gross per 24 hour  Intake 2694.62 ml  Output 3948 ml  Net -1253.38 ml   Height:   Ht Readings from Last 1 Encounters:  06/11/18 5\' 10"   (1.778 m)   Weight:   Wt Readings from Last 1 Encounters:  06/17/18 65 kg  2018/06/07          62.1 kg  Ideal Body Weight:  75.45 kg  BMI:  Body mass index is 20.56 kg/m.  Estimated Nutritional Needs (using admit weight):   Kcal:  1819  Protein:  95-110 gm  Fluid:  per MD  Maureen Chatters, RD, LDN Pager #: [phone] After-Hours Pager #: [phone]

## 2018-06-17 NOTE — Progress Notes (Signed)
ANTICOAGULATION CONSULT NOTE   Pharmacy Consult for Heparin Indication: suspected pulmonary embolus and confirmed DVT  No Known Allergies  Patient Measurements: Height: 5\' 10"  (177.8 cm) Weight: 143 lb 4.8 oz (65 kg) IBW/kg (Calculated) : 73  Vital Signs: Temp: 98.4 F (36.9 C) (11/01 1000) Temp Source: Esophageal (11/01 0400) BP: 114/73 (11/01 1000) Pulse Rate: 105 (11/01 1000)  Labs: Recent Labs    06/15/18 0347  06/15/18 2201 06/16/18 0340 06/16/18 1711 06/17/18 0353  HGB 10.1*  --   --  9.7*  --  9.4*  HCT 32.2*  --   --  30.0*  --  29.8*  PLT 373  --   --  388  --  496*  HEPARINUNFRC 0.21*   < > 0.25* 0.31  --  0.48  CREATININE 2.12*   < >  --  2.13* 1.89* 1.74*  CKTOTAL  --   --   --  7,710*  --   --    < > = values in this interval not displayed.    Estimated Creatinine Clearance: 35.8 mL/min (A) (by C-G formula based on SCr of 1.74 mg/dL (H)).  Assessment: 72yo male with DVT and likely PE s/p PEA arrest and TNKase 10/20 for heparin. Currently on CRRT with minimal UOP.   Gross hematuria now resolved and anemia s/p pRBC 10/25.   11/1: This morning heparin level therapeutic at 0.48 on heparin 1950 units/hr. Platelets stable, Hgb continues to be stable, and hematuria resolving. Per RN, no infusion issues noted.   Goal of Therapy:  Heparin level 0.3-0.5 units/ml Monitor platelets by anticoagulation protocol: Yes   Plan:  Continue Heparin at 1950 units/hr to ensure within therapeutic range Daily heparin level and CBC  Thanks for allowing pharmacy to be a part of this patient's care.  Sheppard Coil PharmD., BCPS Clinical Pharmacist 06/17/2018 11:11 AM

## 2018-06-17 NOTE — Progress Notes (Signed)
Pt has multiple wounds on lower legs, wet to dry dressing applied per MD. Wound care consult ordered.

## 2018-06-17 NOTE — Progress Notes (Signed)
[facility] Progress Note    Assessment/ Plan:   72 year old BM with CKD- (1.5 to 2.0 in 2016, upon presentation was 2.5) and history of PE who presented with PEA arrest, presumed PE with hemodynamic instability and A on CRF 1.Renal-acute on chronic kidney disease. Not sure of the etiology of his chronic kidney disease. However, his acute is most likely secondary to his multisystem organ failure and prolonged hypotension requiring pressors. He is oliguric now requiring renal replacement therapy. CRRT started 10/23. Running well, no heparin, all 4 k bags. I do not think that rhabdo is the cause of his AKI- is probably from code and being in ICU for several days- there is not an effective way to treat rhabdo in an oliguric pt who is already volume overloaded- just need to let it reabsorb  - continues to req RRT and fortunately tolerating CRRT - had been tolerating UF 79m/hr net --> still has some edema in thighs but much better. Wt down to65kg but bedscale  - Filter just changed 12 hrs ago -> will d/c the CRRT tomorrow evening and then challenge with iHD Mon. Currently off pressors.  2. Hypertension/volume- initiallymassively volume overloadedand still w/ significant edema,receiving lots of drips and therefore lots of volume.  3.Hyperkalemia-resolved,continue4 k bags 4. Anemia-situational and multifactorial. Has been transfused this admission-supportive care, per CCM- transfuse as needed- addedESA 5.Metabolic acidosis- improved 6.Hemodynamic instability-multiple pressors with inability to wean. Cardiogenic versus septic shock per cardiology and CCM- some progress 7. Gross hematuria- Renal u/s normal (no hydro) on 10/20, - urology placed cath- achieving good drainage for now, just very little UOP, nurse had been irrigating when the foley was in.  - Darker tinged urine but minimal UOP; therefore cath was pulled yesterday. Repeat bladder scan this am  was 0.  Subjective:   Off Levo and dobutamine.  Tolerating CRRT  Seen on CRRT 4K bath Tolerating 50 ml/hr and still has e/o volume onboard but improved RIJ temp cath Pre/post/qd 300/350/1500 with FF of 10%   Objective:   BP (P) 107/77 (BP Location: Left Arm)   Pulse (!) 114   Temp 98.2 F (36.8 C)   Resp (!) 25   Ht 5' 10"  (1.778 m)   Wt 65 kg   SpO2 100%   BMI 20.56 kg/m   Intake/Output Summary (Last 24 hours) at 06/17/2018 0848 Last data filed at 06/17/2018 0800 Gross per 24 hour  Intake 3017.33 ml  Output 4367 ml  Net -1349.67 ml   Weight change: -1.6 kg  Physical Exam: General: sedated , intubated  Heart:tachy Lungs:clear Abdomen: distended, no BS Extremities: pitting edemaup to thighsstill present but improved Dialysis Access: right IJ temp cath placed 10/23 GU: foley removed  Imaging: Ct Abdomen Pelvis Wo Contrast  Result Date: 06/15/2018 CLINICAL DATA:  Pneumonia, hematuria. EXAM: CT CHEST, ABDOMEN AND PELVIS WITHOUT CONTRAST TECHNIQUE: Multidetector CT imaging of the chest, abdomen and pelvis was performed following the standard protocol without IV contrast. COMPARISON:  CT scan of October 16, 2014. FINDINGS: CT CHEST FINDINGS Cardiovascular: Atherosclerosis of thoracic aorta is noted without aneurysm formation. Normal cardiac size. No pericardial effusion is noted. Mild coronary artery calcifications are noted. Mediastinum/Nodes: No enlarged mediastinal, hilar, or axillary lymph nodes. Endotracheal and nasogastric tubes are in grossly good position. Lungs/Pleura: No pneumothorax is noted. Mild bilateral pleural effusions are noted with adjacent subsegmental atelectasis. 7 mm subpleural nodule is seen in superior segment of left lower lobe best seen on image number 94 series  4. Musculoskeletal: No chest wall mass or suspicious bone lesions identified. CT ABDOMEN PELVIS FINDINGS Hepatobiliary: No focal liver abnormality is seen. No gallstones, gallbladder wall  thickening, or biliary dilatation. Probable sludge is noted within the gallbladder lumen. Pancreas: Unremarkable. No pancreatic ductal dilatation or surrounding inflammatory changes. Spleen: Normal in size without focal abnormality. Adrenals/Urinary Tract: Adrenal glands and kidneys appear normal. No hydronephrosis or renal obstruction is noted. No renal or ureteral calculi are noted. Urinary bladder is decompressed secondary to Foley catheter. Stomach/Bowel: Stomach is within normal limits. Appendix appears normal. No evidence of bowel wall thickening, distention, or inflammatory changes. Rectal tube is present. Vascular/Lymphatic: Aortic atherosclerosis. No enlarged abdominal or pelvic lymph nodes. Reproductive: Prostate is unremarkable. Other: Mild anasarca is noted.  No hernia is noted. Musculoskeletal: No acute or significant osseous findings. IMPRESSION: Endotracheal and nasogastric tubes in grossly good position. Mild bilateral pleural effusions are noted with adjacent subsegmental atelectasis. 7 mm subpleural nodule seen in superior segment of left lower lobe. Non-contrast chest CT at 6-12 months is recommended. If the nodule is stable at time of repeat CT, then future CT at 18-24 months (from today's scan) is considered optional for low-risk patients, but is recommended for high-risk patients. This recommendation follows the consensus statement: Guidelines for Management of Incidental Pulmonary Nodules Detected on CT Images: From the Fleischner Society 2017; Radiology 2017; 284:228-243. Probable sludge seen within gallbladder. Mild anasarca. Aortic Atherosclerosis (ICD10-I70.0). Electronically Signed   By: Marijo Conception, M.D.   On: 06/15/2018 13:59   Ct Head Wo Contrast  Result Date: 06/15/2018 CLINICAL DATA:  72 year old male with altered level of consciousness, pneumonia, hematuria EXAM: CT HEAD WITHOUT CONTRAST TECHNIQUE: Contiguous axial images were obtained from the base of the skull through the  vertex without intravenous contrast. COMPARISON:  Recent prior head CT 06/16/2018 FINDINGS: Brain: No evidence of acute infarction, hemorrhage, hydrocephalus, extra-axial collection or mass lesion/mass effect. Stable mild atrophy. Vascular: No hyperdense vessel or unexpected calcification. Bilateral cavernous carotid artery calcifications. Skull: Normal. Negative for fracture or focal lesion. Sinuses/Orbits: Partial bilateral mastoid sinuses are effusions. Incompletely imaged mucous retention cysts versus polyps in the inferior maxillary sinuses. Other: None IMPRESSION: 1. No acute intracranial abnormality. 2. Stable mild cortical cerebral atrophy. 3. Mild bilateral partial mastoid effusions. Electronically Signed   By: Jacqulynn Cadet M.D.   On: 06/15/2018 13:24   Ct Chest Wo Contrast  Result Date: 06/15/2018 CLINICAL DATA:  Pneumonia, hematuria. EXAM: CT CHEST, ABDOMEN AND PELVIS WITHOUT CONTRAST TECHNIQUE: Multidetector CT imaging of the chest, abdomen and pelvis was performed following the standard protocol without IV contrast. COMPARISON:  CT scan of October 16, 2014. FINDINGS: CT CHEST FINDINGS Cardiovascular: Atherosclerosis of thoracic aorta is noted without aneurysm formation. Normal cardiac size. No pericardial effusion is noted. Mild coronary artery calcifications are noted. Mediastinum/Nodes: No enlarged mediastinal, hilar, or axillary lymph nodes. Endotracheal and nasogastric tubes are in grossly good position. Lungs/Pleura: No pneumothorax is noted. Mild bilateral pleural effusions are noted with adjacent subsegmental atelectasis. 7 mm subpleural nodule is seen in superior segment of left lower lobe best seen on image number 94 series 4. Musculoskeletal: No chest wall mass or suspicious bone lesions identified. CT ABDOMEN PELVIS FINDINGS Hepatobiliary: No focal liver abnormality is seen. No gallstones, gallbladder wall thickening, or biliary dilatation. Probable sludge is noted within the  gallbladder lumen. Pancreas: Unremarkable. No pancreatic ductal dilatation or surrounding inflammatory changes. Spleen: Normal in size without focal abnormality. Adrenals/Urinary Tract: Adrenal glands and kidneys appear normal. No  hydronephrosis or renal obstruction is noted. No renal or ureteral calculi are noted. Urinary bladder is decompressed secondary to Foley catheter. Stomach/Bowel: Stomach is within normal limits. Appendix appears normal. No evidence of bowel wall thickening, distention, or inflammatory changes. Rectal tube is present. Vascular/Lymphatic: Aortic atherosclerosis. No enlarged abdominal or pelvic lymph nodes. Reproductive: Prostate is unremarkable. Other: Mild anasarca is noted.  No hernia is noted. Musculoskeletal: No acute or significant osseous findings. IMPRESSION: Endotracheal and nasogastric tubes in grossly good position. Mild bilateral pleural effusions are noted with adjacent subsegmental atelectasis. 7 mm subpleural nodule seen in superior segment of left lower lobe. Non-contrast chest CT at 6-12 months is recommended. If the nodule is stable at time of repeat CT, then future CT at 18-24 months (from today's scan) is considered optional for low-risk patients, but is recommended for high-risk patients. This recommendation follows the consensus statement: Guidelines for Management of Incidental Pulmonary Nodules Detected on CT Images: From the Fleischner Society 2017; Radiology 2017; 284:228-243. Probable sludge seen within gallbladder. Mild anasarca. Aortic Atherosclerosis (ICD10-I70.0). Electronically Signed   By: Marijo Conception, M.D.   On: 06/15/2018 13:59   Dg Chest Port 1 View  Result Date: 06/16/2018 CLINICAL DATA:  Respiratory failure EXAM: PORTABLE CHEST 1 VIEW COMPARISON:  06/15/2018 FINDINGS: Endotracheal tube, nasogastric catheter and esophageal probe are again noted and stable. Right jugular temporary dialysis catheter is seen. The cardiac shadow is stable. Slight  increased atelectasis is noted in the left base. No pneumothorax is seen. No sizable effusion is noted. IMPRESSION: Slight increase in left basilar atelectasis. Tubes and lines as described. Electronically Signed   By: Inez Catalina M.D.   On: 06/16/2018 08:53    Labs: BMET Recent Labs  Lab 06/14/18 0421 06/14/18 1507 06/15/18 0347 06/15/18 1502 06/16/18 0340 06/16/18 1711 06/17/18 0353  NA 137 139 138 134* 134* 135 135  K 4.1 4.0 4.2 4.1 4.2 4.4 4.2  CL 104 106 103 99 101 101 102  CO2 23 24 24 22 25 24 24   GLUCOSE 120* 116* 139* 113* 136* 166* 137*  BUN 14 15 19  26* 26* 29* 30*  CREATININE 2.34* 1.74* 2.12* 2.45* 2.13* 1.89* 1.74*  CALCIUM 7.4* 7.5* 7.9* 7.7* 7.8* 8.1* 7.8*  PHOS 1.6* 1.4* 1.5* 1.8* 1.9* 2.2* 2.0*   CBC Recent Labs  Lab 06/14/18 0421 06/15/18 0347 06/16/18 0340 06/17/18 0353  WBC 18.5* 21.3* 18.6* 21.7*  HGB 9.7* 10.1* 9.7* 9.4*  HCT 30.8* 32.2* 30.0* 29.8*  MCV 90.9 90.4 88.8 88.7  PLT 285 373 388 496*    Medications:    . chlorhexidine gluconate (MEDLINE KIT)  15 mL Mouth Rinse BID  . Chlorhexidine Gluconate Cloth  6 each Topical Q0600  . darbepoetin (ARANESP) injection - NON-DIALYSIS  200 mcg Subcutaneous Q Sun-1800  . hydrALAZINE  25 mg Oral Q8H  . mouth rinse  15 mL Mouth Rinse 10 times per day  . metoCLOPramide (REGLAN) injection  5 mg Intravenous Q6H  . pantoprazole sodium  40 mg Per Tube Q1200  . QUEtiapine  150 mg Oral BID  . sodium chloride flush  10-40 mL Intracatheter Q12H  . THROMBI-PAD  1 each Topical Once      Otelia Santee, MD 06/17/2018, 8:48 AM

## 2018-06-17 NOTE — Progress Notes (Addendum)
Patient ID: Bryan Parker, male   DOB: [birth year], 72 y.o.   MRN: [id number]     Advanced Heart Failure Rounding Note  PCP-Cardiologist: Fransico Him, MD   Subjective:    Bryan Parker he was hypertensive. Hydralazine given and he developed hypotension. Norepi was restarted but later weaned off.    Remains on CVVHD. Pulling 50 per hour. Foley removed by nephrology.   WBC 18>21. On zosyn and vanc.   Bld Cx - NGTD  Sedated on the vent. Tolerated 30 min wean.      Objective:   Weight Range: 65 kg Body mass index is 20.56 kg/m.   Vital Signs:   Temp:  [96.4 F (35.8 C)-99.9 F (37.7 C)] 98.1 F (36.7 C) (11/01 0700) Pulse Rate:  [94-125] 104 (11/01 0700) Resp:  [17-38] 26 (11/01 0700) BP: (92-167)/(48-93) 106/78 (11/01 0700) SpO2:  [100 %] 100 % (11/01 0700) Arterial Line BP: (94-244)/(41-241) 109/48 (11/01 0700) FiO2 (%):  [40 %] 40 % (11/01 0400) Weight:  [65 kg] 65 kg (11/01 0500) Last BM Date: (rectal tube in place)  Weight change: Filed Weights   06/15/18 0500 06/16/18 0346 06/17/18 0500  Weight: 67.8 kg 66.6 kg 65 kg    Intake/Output:   Intake/Output Summary (Last 24 hours) at 06/17/2018 0707 Last data filed at 06/17/2018 0700 Gross per 24 hour  Intake 3155.29 ml  Output 4463 ml  Net -1307.71 ml      Physical Exam  General:  Intubated/sedated HEENT: normal Neck: supple. JVP 5-6no JVD. Carotids 2+ bilat; no bruits. No lymphadenopathy or thryomegaly appreciated. RIJ HD catheter.  Cor: PMI nondisplaced. Tachy Regular rate & rhythm. No rubs, gallops or murmurs. Lungs: clear Abdomen: soft, nontender, nondistended. No hepatosplenomegaly. No bruits or masses. Good bowel sounds. Extremities: no cyanosis, clubbing, rash, R and LLE trace edema Neuro: intubated and sedated.    Telemetry   Sinus Tach 100-110s personally reviewed.    EKG    No new tracings.   Labs    CBC Recent Labs    06/16/18 0340 06/17/18 0353  WBC 18.6* 21.7*  HGB 9.7* 9.4*    HCT 30.0* 29.8*  MCV 88.8 88.7  PLT 388 462*   Basic Metabolic Panel Recent Labs    06/16/18 0340 06/16/18 1711 06/17/18 0353  NA 134* 135 135  K 4.2 4.4 4.2  CL 101 101 102  CO2 25 24 24   GLUCOSE 136* 166* 137*  BUN 26* 29* 30*  CREATININE 2.13* 1.89* 1.74*  CALCIUM 7.8* 8.1* 7.8*  MG 2.5*  --  2.7*  PHOS 1.9* 2.2* 2.0*   Liver Function Tests Recent Labs    06/15/18 0347  06/16/18 1711 06/17/18 0353  AST 335*  --   --   --   ALT 163*  --   --   --   ALKPHOS 118  --   --   --   BILITOT 5.5*  --   --   --   PROT 6.1*  --   --   --   ALBUMIN 1.8*   < > 1.6* 1.6*   < > = values in this interval not displayed.   No results for input(s): LIPASE, AMYLASE in the last 72 hours. Cardiac Enzymes Recent Labs    06/16/18 0340  CKTOTAL 7,710*    BNP: BNP (last 3 results) Recent Labs    06/16/2018 0854  BNP 18.0    ProBNP (last 3 results) No results for input(s): PROBNP in the last 8760  hours.   D-Dimer No results for input(s): DDIMER in the last 72 hours. Hemoglobin A1C No results for input(s): HGBA1C in the last 72 hours. Fasting Lipid Panel Recent Labs    06/14/18 1248  TRIG 182*   Thyroid Function Tests No results for input(s): TSH, T4TOTAL, T3FREE, THYROIDAB in the last 72 hours.  Invalid input(s): FREET3  Other results:   Imaging    No results found.   Medications:     Scheduled Medications: . chlorhexidine gluconate (MEDLINE KIT)  15 mL Mouth Rinse BID  . Chlorhexidine Gluconate Cloth  6 each Topical Q0600  . darbepoetin (ARANESP) injection - NON-DIALYSIS  200 mcg Subcutaneous Q Sun-1800  . hydrALAZINE  25 mg Oral Q8H  . mouth rinse  15 mL Mouth Rinse 10 times per day  . metoCLOPramide (REGLAN) injection  5 mg Intravenous Q6H  . pantoprazole sodium  40 mg Per Tube Q1200  . QUEtiapine  150 mg Oral BID  . sodium chloride flush  10-40 mL Intracatheter Q12H  . THROMBI-PAD  1 each Topical Once    Infusions: . sodium chloride Stopped  (06/13/18 1012)  . sodium chloride 10 mL/hr at 06/17/18 0700  . sodium chloride    . feeding supplement (VITAL AF 1.2 CAL) 1,000 mL (06/16/18 2024)  . fentaNYL infusion INTRAVENOUS 50 mcg/hr (06/17/18 0700)  . heparin 1,950 Units/hr (06/17/18 0700)  . norepinephrine (LEVOPHED) Adult infusion Stopped (06/16/18 1940)  . dialysis replacement fluid (prismasate) 300 mL/hr at 06/16/18 1639  . dialysis replacement fluid (prismasate) 350 mL/hr at 06/16/18 1845  . prismasol BGK 4/2.5 1,500 mL/hr at 06/17/18 0558  . propofol (DIPRIVAN) infusion 20 mcg/kg/min (06/17/18 0700)  . sodium chloride    . sodium chloride    . vancomycin Stopped (06/16/18 1719)    PRN Medications: sodium chloride, Place/Maintain arterial line **AND** sodium chloride, acetaminophen (TYLENOL) oral liquid 160 mg/5 mL, artificial tears, fentaNYL (SUBLIMAZE) injection, Gerhardt's butt cream, heparin, hydrALAZINE, Influenza vac split quadrivalent PF, midazolam, sodium chloride, sodium chloride flush, white petrolatum    Patient Profile   Bryan Parker is a 72 y.o. 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  Presented to [facility] after PEA arrest with 10+ minutes down, presumed large PE. Now with RV failure.  Assessment/Plan   1. Shock: Mixed cardiogenic/septic. 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 Also septic shock with very high PCT, fever, and presumed PNA.  Echo 10/2014 showed EF 60%, RV normal, PA peak pressure 73 mmHg.  Echo 06/08/2018 showed LV EF 55-60%, RV severely dilated and dysfunctional, moderate TR, moderately dilated RA, PA peak pressure 31 mmHg.   - Did not place RV impella with extensive bleeding post-TNKase and extensive clot.  -remains on CVVHD. Weight down another 3 pounds.  - 2. PEA arrest: Suspect secondary to massive PE.  Received 10+ minutes CPR. Head CT negative.  CT of head was negative.  3. Presumed massive PE: Has hx of  PE in 2016, but has not been on chronic anticoagulation (off >1 year).  Lower extremity dopplers this admission positive for left popliteal acute DVT.  Received TNKase in ED.  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  -Continue heparin drip.   4. ID: WBC 21 today. Foley removed 10/31. Last procalcitonin was 75. Concerned for HCAP, possibly in setting of aspiration (lower lobe infiltrates on CXR).  Cultures remain negative.   -  On vancomycin  5. AKI on CKD: Baseline creatinine looks to be 1.6-2.2 range (cause of baseline CKD uncertain).  Remains on CVVH, UF 50 cc/hr.  Renal US negative for hydronephrosis.  AKI due to shock with likely contribution from rhabdomyolysis.  6. Anemia: Profuse bleeding from OGT and urethra post-TNKase.  He has received multiple units PRBCs.  Hgb 9.4. Stable.  - Urology consulted. Supportive care for now, but recommended further imaging if possible and ?TURP in future. Continues to have hematuria with foley irrigation.   7. Elevated liver enzymes: In setting of shock (ischemic hepatitis).  8. Hypocalcemia: per nephrology. 9. Acute respiratory hypoxemic respiratory failure: Remains intubated, suspect due to combination of PNA and pulmonary edema. Remains intubated. FiO2 40%.  10. Thrombocytopenia:   Suspect fall in plts in setting of sepsis, critical illness/inflammation. Platelets are now normal.  11. Rhabdomyolysis: CK continues to trend down.  No role for IVF in setting of ESRD - Per primary. Continue CRRT 12. Ileus: Resolving, TFs going in.   Length of Stay: [place], NP  06/17/2018, 7:07 AM  Advanced Heart Failure Team Pager [phone] (M-F; 7a - 4p)  Please contact [facility] for night-coverage after hours (4p -7a ) and weekends on [email or web address]  Patient seen with NP, agree with the above note.   He is stable today compared to yesterday.  Pulling 50 cc/hr via CVVH, weight continues to fall.   Minimal UOP.  Attempting vent wean this morning but very agitated and HR increasing.   Afebrile, he remains on vancomycin (off Zosyn).   He is off pressors.    Heparin ongoing for PE/DVT.   Decision needs to be made regarding tracheostomy.  He is likely also going to be dialysis dependent.  He has multiple areas of skin breakdown.  I suspect that long-term prognosis is going to be poor.   Loralie Champagne 06/17/2018 7:52 AM

## 2018-06-17 DEATH — deceased

## 2018-06-18 ENCOUNTER — Encounter (HOSPITAL_COMMUNITY): Payer: Self-pay

## 2018-06-18 ENCOUNTER — Inpatient Hospital Stay (HOSPITAL_COMMUNITY): Payer: Medicare Other

## 2018-06-18 DIAGNOSIS — Z930 Tracheostomy status: Secondary | ICD-10-CM

## 2018-06-18 LAB — RENAL FUNCTION PANEL
ALBUMIN: 1.7 g/dL — AB (ref 3.5–5.0)
ANION GAP: 8 (ref 5–15)
Albumin: 1.7 g/dL — ABNORMAL LOW (ref 3.5–5.0)
Anion gap: 13 (ref 5–15)
BUN: 28 mg/dL — ABNORMAL HIGH (ref 8–23)
BUN: 42 mg/dL — AB (ref 8–23)
CALCIUM: 8.4 mg/dL — AB (ref 8.9–10.3)
CHLORIDE: 102 mmol/L (ref 98–111)
CHLORIDE: 98 mmol/L (ref 98–111)
CO2: 24 mmol/L (ref 22–32)
CO2: 22 mmol/L (ref 22–32)
CREATININE: 1.48 mg/dL — AB (ref 0.61–1.24)
CREATININE: 2.55 mg/dL — AB (ref 0.61–1.24)
Calcium: 8.3 mg/dL — ABNORMAL LOW (ref 8.9–10.3)
GFR calc Af Amer: 53 mL/min — ABNORMAL LOW (ref >60)
GFR calc non Af Amer: 24 mL/min — ABNORMAL LOW (ref >60)
GFR, EST AFRICAN AMERICAN: 28 mL/min — AB (ref >60)
GFR, EST NON AFRICAN AMERICAN: 46 mL/min — AB (ref >60)
Glucose, Bld: 115 mg/dL — ABNORMAL HIGH (ref 70–99)
Glucose, Bld: 118 mg/dL — ABNORMAL HIGH (ref 70–99)
POTASSIUM: 4.9 mmol/L (ref 3.5–5.1)
Phosphorus: 2.5 mg/dL (ref 2.5–4.6)
Phosphorus: 3 mg/dL (ref 2.5–4.6)
Potassium: 4.8 mmol/L (ref 3.5–5.1)
SODIUM: 133 mmol/L — AB (ref 135–145)
Sodium: 134 mmol/L — ABNORMAL LOW (ref 135–145)

## 2018-06-18 LAB — GLUCOSE, CAPILLARY
GLUCOSE-CAPILLARY: 108 mg/dL — AB (ref 70–99)
GLUCOSE-CAPILLARY: 113 mg/dL — AB (ref 70–99)
GLUCOSE-CAPILLARY: 145 mg/dL — AB (ref 70–99)
GLUCOSE-CAPILLARY: 96 mg/dL (ref 70–99)
Glucose-Capillary: 100 mg/dL — ABNORMAL HIGH (ref 70–99)
Glucose-Capillary: 111 mg/dL — ABNORMAL HIGH (ref 70–99)

## 2018-06-18 LAB — PROCALCITONIN: PROCALCITONIN: 14.11 ng/mL

## 2018-06-18 LAB — CBC
HCT: 29.3 % — ABNORMAL LOW (ref 39.0–52.0)
Hemoglobin: 9.6 g/dL — ABNORMAL LOW (ref 13.0–17.0)
MCH: 28.6 pg (ref 26.0–34.0)
MCHC: 32.8 g/dL (ref 30.0–36.0)
MCV: 87.2 fL (ref 80.0–100.0)
Platelets: 572 10*3/uL — ABNORMAL HIGH (ref 150–400)
RBC: 3.36 MIL/uL — ABNORMAL LOW (ref 4.22–5.81)
RDW: 18.7 % — ABNORMAL HIGH (ref 11.5–15.5)
WBC: 22.7 10*3/uL — ABNORMAL HIGH (ref 4.0–10.5)
nRBC: 2.9 % — ABNORMAL HIGH (ref 0.0–0.2)

## 2018-06-18 LAB — PROTIME-INR
INR: 1.09
Prothrombin Time: 14 seconds (ref 11.4–15.2)

## 2018-06-18 LAB — HEPARIN LEVEL (UNFRACTIONATED)

## 2018-06-18 LAB — MAGNESIUM: Magnesium: 2.6 mg/dL — ABNORMAL HIGH (ref 1.7–2.4)

## 2018-06-18 MED ORDER — ACETAMINOPHEN 160 MG/5ML PO SOLN
650.0000 mg | Freq: Four times a day (QID) | ORAL | Status: DC | PRN
  Administered 2018-06-18 – 2018-06-25 (×6): 650 mg
  Filled 2018-06-18 (×6): qty 20.3

## 2018-06-18 MED ORDER — ACETAMINOPHEN 650 MG RE SUPP
650.0000 mg | Freq: Four times a day (QID) | RECTAL | Status: DC | PRN
  Filled 2018-06-18: qty 1

## 2018-06-18 MED ORDER — HEPARIN (PORCINE) IN NACL 100-0.45 UNIT/ML-% IJ SOLN
1650.0000 [IU]/h | INTRAMUSCULAR | Status: DC
  Administered 2018-06-18 – 2018-06-19 (×2): 1950 [IU]/h via INTRAVENOUS
  Administered 2018-06-20: 1900 [IU]/h via INTRAVENOUS
  Administered 2018-06-21: 1800 [IU]/h via INTRAVENOUS
  Administered 2018-06-22: 1250 [IU]/h via INTRAVENOUS
  Administered 2018-06-22: 1450 [IU]/h via INTRAVENOUS
  Administered 2018-06-22: 1350 [IU]/h via INTRAVENOUS
  Filled 2018-06-18 (×11): qty 250

## 2018-06-18 NOTE — Progress Notes (Addendum)
NAME:  Bryan Parker, MRN:  [id number], DOB:  [birth year], LOS: 13 ADMISSION DATE:  05/29/2018, CONSULTATION DATE:  05/30/2018 REFERRING MD:  Dr. Rhunette Croft , CHIEF COMPLAINT:  Cardiac Arrest    Brief History   72 year old male presents to ED s/p Cardiac Arrest. Per Report patient called EMS for Abdominal Pain/Nausea/Vomiting. When EMS arrived patient was alert however went pulseless and PEA while on scene. ROSC achieved after 2 EPI and 10 minutes CPR. On arrival patient continued to be unstable and required multiple rounds of CPR. Fast exam negative. Bedside ECHO revealed slightly dilated RV. ABG 6.914/69.5/368. Progressive hypotension requiring Levophed/EPI/Vasopresin and 9 Bicarb pushes.    Wife reports that patient was complaining of severe left calf pain all day 10/19. Has a history of PE 2016. Was taken off anticoagulation 2018. Due to high suspicion of PE TNKase was given in ED. PCCM asked to admit.   Past Medical History  H/O DVT/PE, CKD  Significant Hospital Events   10/20 > Presents to ED  1021 with a partial code no CPR 10/22 bleeding from IV sites and foley, heparin d/c'd for 2 hours then resumed. Hgb 6.8 > 1u PRBC transfused 10/23 CRRT started.  Giapreza added. 10/24 coude catheter placed by urology and CBI started. 10/25 - Pressor requirements much improved after giaprezza added 10/23; however, remains on vaso0.03, NE 23, epi 10, dobut 20, giapreza 25. CK up from 14.5 on 10/24 up to 35K on 10/25.  CRRT running.  10/26 - CK 31,000 (was 34,000 yesterday) . Volume removal continues. Fluid challenge strategy cancelled. CK better. Legs thinner with volume removal. Pressors: off giaprezza, levophed 6, dobutamine 10, epi 16 and vaso 0.03. Had vomit yesterday and TF on hold. Has bloody urine - seen by urology  10/27  - pressor needs coming down - off epi gtt. Dobutamine 10, Vaso 0.03 and levophed 4-37mcg/min. . Still largely volume . Volume removal via CRRT. CK down to 24K. TF still on hold.   10/28 brady , hypotensive with precedex 11/2>> Trach   Consults: date of consult/date signed off & final recs:  PCCM 10/20 Heart failure 10/21 >  Urology 10/24 >  renal  Procedures (surgical and bedside):  ETT 10/20 >> Right Femoral CVC 10/20 >> 10/29 Left Femoral Aline 10/20 >> 10/30 L IJ HD cath 10/23 >  Rt radial a line 10/30 >>  Significant Diagnostic Tests:  CT Head 10/20 >> neg Echo 10/20 > EF 55 - 60%, severely dilated RV, mod dilated RA, mod TR. CT chest/abd/ pelvis 10/30  >> no bleed CT head 10/30 neg  Micro Data:  Blood 10/20 >> neg Sputum 10/20 >> neg U/A 10/20 >> neg Blood 10/29 > ng resp 10/29  >>ng Sputum 10/29>> GS few gram +>>  Few candida albicans Blood 10/29>>   Antimicrobials:  zosyn 10/20 >> vanc 10/20 >>off, 10/29 >>  SUBJECTIVE/OVERNIGHT/INTERVAL HX   T max 100.8 Critically ill, intubated on propofol + fent gtt CRRT discontinued 11/2 am, will challenge with HD Monday Trach 11/2 per Dr. Molli Knock  Objective   Blood pressure (!) 112/53, pulse (!) 111, temperature (!) 100.8 F (38.2 C), resp. rate (!) 21, height 5\' 10"  (1.778 m), weight 62.7 kg, SpO2 100 %.    Vent Mode: PRVC FiO2 (%):  [40 %] 40 % Set Rate:  [14 bmp] 14 bmp Vt Set:  [500 mL] 500 mL PEEP:  [5 cmH20] 5 cmH20 Plateau Pressure:  [17 cmH20-19 cmH20] 18 cmH20   Intake/Output Summary (  Last 24 hours) at 06/18/2018 1139 Last data filed at 06/18/2018 0900 Gross per 24 hour  Intake 2397.68 ml  Output 2485 ml  Net -87.32 ml   Filed Weights   06/16/18 0346 06/17/18 0500 06/18/18 0500  Weight: 66.6 kg 65 kg 62.7 kg    Chronic critically ill, remains agitated when sedation weaned Icterus +, mild pallor Bilateral chest excursion, Decreased breath sounds bilaterally Soft and non tender, ND, BS +  Essentially no UO, bladder scan with 0 cc's UO Does not follow commands, RASS -2 Cool extremities, dusky toes & feet   No CXR to view this am       Assessment & Plan:    #acute resp failure - s/p intubation in ED.  PLAN -Spontaneous breathing trials have been limited by encephalopathy and deconditioning  Titrate fentanyl and propofol to minimal dose needed  Trach 11/2 2019>> Dr. Molli Knock CXR prn SBT starting 11/3 Routine Trach care  #PEA Cardiac Arrest - concern due to massive PE.   Shock - likely mixed, obstructive due to above and likely septic/HCAP vs aspiration.given PCT > 150. - resolved New shock 10/29 likely due to sepsis - resolved plan - Cardiology following  #Presumed PE - s/p TPA in ED. LLE DVT. Hx PE / DVT 2016 (no longer on anticoagulation).  Plan - Resume  aranesp gtt. At 1600  After trach - Monitor CBC and bleeding     # New fever 10/29, WBC rising again - T max 100.8 - all femoral lines dc'd & defervesced,CT non diagnostic Plan: - dc zosyn , ct vanc x 7 days per pharmacy - Sputum with few candida albicans - Blood Cultures pending follow results - PCT remains elevated at 14.11, continue to trend - Trend fever and WBC    #AKI on Chronic Kidney Failure - likely exacerbated by arrest and rhabdo.  Now on CRRT. #Rhabdomyolysis - CK peaked at 35K on 10/25 now down to 7k  Hyponatremia - Nephrology following - CRRT, and transition to intermittent dialysis now that off pressors - plan to challenge with CRRT Monday 11/4 - Follow CK  intermittent    #Acute blood loss anemia - had bleeding from IV sites and foley - about 5-6 U PRBC so far Foley cath has been removed HGB stable at present  PLAN - Maintain Hgb > 8 following PEA arrest.   Urethral bleeding - likely traumatic following tPA administration. - Urology following, doubt need for cystoscopy. - Foley discontinued    #Hyperglycemia. - Continue SSI.  #Acute Encephalopathy - metabolic + from PEA arrest. -Precedex was stopped due to bradycardia and hypotension -ct propofol + fent gtt -goal RASS 0 to -1 -Increased Seroquel 150 twice daily - QTc  monitoring  #Vomit 10/25/19KUB - no ileus PLAN  -TF @ goal - re-insert feeding tube post trach - resume feeds post trach   #Thrombocytopenia.-  Resolved Trend CBC   Disposition / Summary of Today's Plan 06/18/18   Trach done 11/2 per Dr. 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  Also there is no renal recovery so seems like he will need longer-term dialysis. WBC remain high with elevated PCT, awaiting most recent blood cultures results, continue vanc Overall prognosis is guarded   Code Status: 06/07/2018 partial code no CPR Family Communication: No family at bedside 11/2 while rounding     ATTESTATION & SIGNATURE  Bevelyn Ngo, AGACNP-BC [facility] Pulmonary/Critical Care Medicine Pager # [phone] After 3 pm pager # [phone] 06/18/2018 11:39 AM  Attending Note:  72 year old male S/P cardiac arrest and resuscitation.  Patient is failing weaning.  On exam, he is unresponsive with coarse BS diffusely.  I reviewed CXR myself, ETT is in a good position.  Discussed with PCCM-NP.  Spoke with wife, consent for tracheostomy obtained.  Proceed with tracheostomy.  Hold off weaning today.  Will begin aggressive weaning efforts in AM.  Diureses as BP allows.  Minimize sedation now that patient is trached.  PCCM will continue to follow.  Will likely need PEG as well prior to placement.  The patient is critically ill with multiple organ systems failure and requires high complexity decision making for assessment and support, frequent evaluation and titration of therapies, application of advanced monitoring technologies and extensive interpretation of multiple databases.   Critical Care Time devoted to patient care services described in this note is  32  Minutes. This time reflects time of care of this signee Dr Koren Bound. This critical care time does not  reflect procedure time, or teaching time or supervisory time of PA/NP/Med student/Med Resident etc but could involve care discussion time.  Alyson Reedy, M.D. [facility] Pulmonary/Critical Care Medicine. Pager: [phone]. After hours pager: [phone].

## 2018-06-18 NOTE — Progress Notes (Addendum)
   06/18/18 2010  Neurological  Neuro (WDL) X  Level of Consciousness Responds to Pain  Orientation Level Intubated/Tracheostomy - Unable to assess  Cognition Unable to follow commands  Speech Intubated/Tracheostomy - Unable to assess  Pupil Assessment  Yes  R Pupil Size (mm) 2  R Pupil Shape Round  R Pupil Reaction Brisk  L Pupil Size (mm) 4  L Pupil Shape Round  L Pupil Reaction Brisk  R Hand Grip Absent  L Hand Grip Weak   R Foot Dorsiflexion Weak  L Foot Dorsiflexion Weak  RUE Motor Response No movment to painful stimulus  LUE Motor Response Purposeful movement (moves to painful stimulus)  RLE Motor Response Purposeful movement  LLE Motor Response Purposeful movement   Patient unable to move RUE to painful stimulus. L pupil > R pupil; both pupils briskly reactive and accomodating to light. Per day shift RN, this is a new finding. [facility] notified. STAT head CT ordered. Patient transported to CT with no complications. Returned to unit and updated family.

## 2018-06-18 NOTE — Progress Notes (Signed)
ANTICOAGULATION CONSULT NOTE   Pharmacy Consult for Heparin Indication: suspected pulmonary embolus and confirmed DVT  No Known Allergies  Patient Measurements: Height: 5\' 10"  (177.8 cm) Weight: 138 lb 3.7 oz (62.7 kg) IBW/kg (Calculated) : 73  Vital Signs: Temp: 100.4 F (38 C) (11/02 1200) Temp Source: Esophageal (11/02 0800) BP: 129/51 (11/02 1207) Pulse Rate: 107 (11/02 1207)  Labs: Recent Labs    06/16/18 0340  06/17/18 0353 06/17/18 1727 06/18/18 0338 06/18/18 0346 06/18/18 0715  HGB 9.7*  --  9.4*  --  9.6*  --   --   HCT 30.0*  --  29.8*  --  29.3*  --   --   PLT 388  --  496*  --  572*  --   --   LABPROT  --   --   --   --   --   --  14.0  INR  --   --   --   --   --   --  1.09  HEPARINUNFRC 0.31  --  0.48  --   --  <0.10*  --   CREATININE 2.13*   < > 1.74* 1.60* 1.48*  --   --   CKTOTAL 7,710*  --   --   --   --   --   --    < > = values in this interval not displayed.    Estimated Creatinine Clearance: 40.6 mL/min (A) (by C-G formula based on SCr of 1.48 mg/dL (H)).  Assessment: 72yo male with DVT and likely PE s/p PEA arrest and TNKase 10/20 for heparin. Currently on CRRT with minimal UOP.   Gross hematuria now resolved and anemia s/p pRBC 10/25.   Heparin turned off at midnight for trach placement. HL undetectable as heparin drip off. CBC Low but relatively stable. Discussed with Dr. Molli Knock, will restart heparin 4 hours after procedure. Still some blood output noted from penis.  Goal of Therapy:  Heparin level 0.3-0.5 units/ml Monitor platelets by anticoagulation protocol: Yes   Plan:  Restart heparin at 1950 units/hr at 1600 Heparin level ~8 hours after starting drip  Daily heparin level and CBC  Thanks for allowing pharmacy to be a part of this patient's care.  Marcelino Freestone, PharmD PGY2 Cardiology Pharmacy Resident Phone [phone] Please check AMION for all Pharmacist numbers by unit 06/18/2018 12:11 PM ,

## 2018-06-18 NOTE — Progress Notes (Signed)
Bryan Parker Progress Note    Assessment/ Plan:   72 year old BM with CKD- (1.5 to 2.0 in 2016, upon presentation was 2.5) and history of PE who presented with PEA arrest, presumed PE with hemodynamic instability and A on CRF 1.Renal-acute on chronic kidney disease. Not sure of the etiology of his chronic kidney disease. However, his acute is most likely secondary to his multisystem organ failure and prolonged hypotension requiring pressors. He is oliguric now requiring renal replacement therapy. CRRT started 10/23. Running well, no heparin, all 4 k bags. I do not think that rhabdo is the cause of his AKI- is probably from code and being in ICU for several days- there is not an effective way to treat rhabdo in an oliguric pt who is already volume overloaded- just need to let it reabsorb  - continues to req RRT and fortunately tolerating CRRT -had been tolerating UF 41m/hr net--> still has some edema inthighsbut certainly markedly improved.  - Of note on low dose Levophed for some time overnight   - Will continue UF net 517mhr and stop the CRRT this evening and will challenge with iHD on Monday to see if he tolerates o/w will be back on CRRT. Essentially no UOP.  Bladder scan yesterday was 0.   2. Hypertension/volume- initiallymassively volume overloadedand still w/ significant edema. 3.Hyperkalemia-resolved,continue4 k bags 4. Anemia-situational and multifactorial. Has been transfused this admission-supportive care, per CCM- transfuse as needed- addedESA 5.Metabolic acidosis- improved 6.Hemodynamic instability-multiple pressors with inability to wean. Cardiogenic versus septic shock per cardiology and CCM- some progress. Currently intermittent low dose Levo. 7. Gross hematuria- Renal u/s normal (no hydro) on 10/20, - urology placed cath- achieving good drainage for now, just very little UOP, nurse had been irrigating when the foley was in.   -Darkertinged urinebut minimal UOP; therefore cath was pulled 10/31. Repeat bladder scan yest was 0.   Subjective:   Intermittent low dose Levo overnight Seen on CRRT 4K bath Tolerating 50 ml/hr and still has e/o volume onboardbut improved RIJ temp cath Pre/post/qd 300/350/1500 with FF of 8%    Objective:   BP (!) 262/239 (BP Location: Right Arm)   Pulse (!) 108   Temp 98.6 F (37 C)   Resp (!) 26   Ht _0  (1.778 m)   Wt 65 kg   SpO2 100%   BMI 20.56 kg/m   Intake/Output Summary (Last 24 hours) at 06/18/2018 0516 Last data filed at 06/18/2018 0500 Gross per 24 hour  Intake 2674.06 ml  Output 3320 ml  Net -645.94 ml   Weight change:   Physical Exam: General: sedated , intubated  Heart:tachy Lungs:clear Abdomen: distended, no BS Extremities: pitting edemaup to thighsstill present Dialysis Access: right IJ temp cath placed 10/23 GU: foley removed  Imaging: Dg Chest Port 1 View  Result Date: 06/16/2018 CLINICAL DATA:  Respiratory failure EXAM: PORTABLE CHEST 1 VIEW COMPARISON:  06/15/2018 FINDINGS: Endotracheal tube, nasogastric catheter and esophageal probe are again noted and stable. Right jugular temporary dialysis catheter is seen. The cardiac shadow is stable. Slight increased atelectasis is noted in the left base. No pneumothorax is seen. No sizable effusion is noted. IMPRESSION: Slight increase in left basilar atelectasis. Tubes and lines as described. Electronically Signed   By: MaInez Catalina.D.   On: 06/16/2018 08:53    Labs: BMET Recent Labs  Lab 06/15/18 0347 06/15/18 1502 06/16/18 0340 06/16/18 1711 06/17/18 0353 06/17/18 1727 06/18/18 0338  NA 138 134* 134* 135 135  136 134*  K 4.2 4.1 4.2 4.4 4.2 4.6 4.9  CL 103 99 101 101 102 100 102  CO2 _0 GLUCOSE 139* 113* 136* 166* 137* 144* 115*  BUN 19 26* 26* 29* 30* 30* 28*  CREATININE 2.12* 2.45* 2.13* 1.89* 1.74* 1.60* 1.48*  CALCIUM 7.9* 7.7* 7.8* 8.1* 7.8* 8.3*  8.3*  PHOS 1.5* 1.8* 1.9* 2.2* 2.0* 2.3* 2.5   CBC Recent Labs  Lab 06/15/18 0347 06/16/18 0340 06/17/18 0353 06/18/18 0338  WBC 21.3* 18.6* 21.7* 22.7*  HGB 10.1* 9.7* 9.4* 9.6*  HCT 32.2* 30.0* 29.8* 29.3*  MCV 90.4 88.8 88.7 87.2  PLT 373 388 496* 572*    Medications:    . chlorhexidine gluconate (MEDLINE KIT)  15 mL Mouth Rinse BID  . Chlorhexidine Gluconate Cloth  6 each Topical Q0600  . darbepoetin (ARANESP) injection - NON-DIALYSIS  200 mcg Subcutaneous Q Sun-1800  . etomidate  40 mg Intravenous Once  . fentaNYL (SUBLIMAZE) injection  200 mcg Intravenous Once  . hydrALAZINE  25 mg Oral Q8H  . mouth rinse  15 mL Mouth Rinse 10 times per day  . metoCLOPramide (REGLAN) injection  5 mg Intravenous Q6H  . midazolam  4 mg Intravenous Once  . pantoprazole sodium  40 mg Per Tube Q1200  . QUEtiapine  150 mg Oral BID  . sodium chloride flush  10-40 mL Intracatheter Q12H  . THROMBI-PAD  1 each Topical Once  . vecuronium  10 mg Intravenous Once      Otelia Santee, MD 06/18/2018, 5:16 AM

## 2018-06-18 NOTE — Procedures (Signed)
Percutaneous Tracheostomy Placement  Consent from family.  Patient sedated, paralyzed and position.  Placed on 100% FiO2 and RR matched.  Area cleaned and draped.  Lidocaine/epi injected.  Skin incision done followed by blunt dissection.  Trachea palpated then punctured, catheter passed and visualized bronchoscopically.  Wire placed and visualized.  Catheter removed.  Airway then entered and dilated.  Size 6 cuffed shiley trach placed and visualized bronchoscopically well above carina.  Good volume returns.  Patient tolerated the procedure well without complications.  Minimal blood loss.  CXR ordered and pending.  Wesam G. Yacoub, M.D. [facility]. Pager: [phone]. After hours pager: [phone].  

## 2018-06-18 NOTE — Procedures (Signed)
Bronchoscopy Procedure Note Bryan Parker [id number] 1945-10-23  Procedure: Bronchoscopy Indications: For placement of percutaneous tracheostomy   Procedure Details Consent: Risks of procedure as well as the alternatives and risks of each were explained to the (patient/caregiver).  Consent for procedure obtained.   Time Out: Verified patient identification, verified procedure, site/side was marked, verified correct patient position, special equipment/implants available, medications/allergies/relevent history reviewed, required imaging and test results available.  Performed  In preparation for procedure, patient was given 100% FiO2. Sedation: Benzodiazepines and Muscle relaxants  Airway entered and the following bronchi were examined: Bronchi.   Procedures performed: None, direct visualization for percutaneous tracheostomy placement. The appropriate location was confirmed after insertion by Dr. Molli Knock.  Bronchoscope removed.    Evaluation Hemodynamic Status: BP stable throughout; O2 sats: stable throughout Patient's Current Condition: stable Specimens:  None Complications: No apparent complications Patient did tolerate procedure well.   Bryan Igo, DO [facility] 06/18/2018 12:08 PM  Personal pager: [phone] If unanswered, please page CCM On-call: #[phone]

## 2018-06-18 NOTE — Progress Notes (Signed)
Patient transported from 2H06 to CT and back with no complications.

## 2018-06-18 NOTE — Procedures (Signed)
Cortrak  Tube Type:  Cortrak - 43 inches Tube Location:  Left nare Initial Placement:  Postpyloric Secured by: Bridle Technique Used to Measure Tube Placement:  Documented cm marking at nare/ corner of mouth Cortrak Secured At:  85 cm Procedure Comments:  Cortrak Tube Team Note:  Consult received to place a Cortrak feeding tube.   No x-ray is required. RN may begin using tube.   If the tube becomes dislodged please keep the tube and contact the Cortrak team at [email or web address] (password [email or web address]) for replacement.  If after hours and replacement cannot be delayed, place a NG tube and confirm placement with an abdominal x-ray.    Betsey Holiday MS, RD, LDN Pager #- [phone] Office#- [phone] After Hours Pager: [phone]

## 2018-06-18 NOTE — Progress Notes (Signed)
   06/18/18 2100  Clinical Encounter Type  Visited With Patient and family together;Family;Health care provider  Visit Type Initial;Spiritual support;Psychological support;Social support;Other (Comment) (new complication)  Referral From Other (Comment) Tax adviser)  Spiritual Encounters  Spiritual Needs Grief support;Emotional;Prayer  Stress Factors  Patient Stress Factors Major life changes  Family Stress Factors Lack of caregivers;Family relationships;Financial concerns;Exhausted;Major life changes;Loss of control   Responded to page to meet w/ pt's wife in waiting rm; pt was in CT to ck for possible stroke.  The spouse's father just was d/c earlier today from this hospital and her mother has dementia.  Spouse is overwhelmed and not currently comfortable w/ requesting support from others.  Pt's daughter and granddaughter arrived.  After ck'ing w/ RN, I accompanied all three back to room as pt had returned.  Supportive while they were updated by RN, prayed w/ pt and family at bedside.  Margretta Sidle resident, [phone]

## 2018-06-18 NOTE — Procedures (Signed)
Bedside Tracheostomy Insertion Procedure Note   Patient Details:   Name: Bryan Parker DOB: 06-15-1946 MRN: [id number]  Procedure: Tracheostomy  Pre Procedure Assessment: ET Tube Size:7.0 ET Tube secured at lip (cm):23 Bite block in place: No Breath Sounds: Diminished and Rhonch  Post Procedure Assessment: BP (!) 129/51   Pulse (!) 107   Temp (!) 100.4 F (38 C)   Resp 14   Ht 5\' 10"  (1.778 m)   Wt 62.7 kg   SpO2 100%   BMI 19.83 kg/m  O2 sats: stable throughout Complications: No apparent complications Patient did tolerate procedure well Tracheostomy Brand:Shiley Tracheostomy Style:Cuffed Tracheostomy Size: 6 Tracheostomy Secured BJY:[id number] Tracheostomy Placement Confirmation:Trach cuff visualized and in place and Chest X ray ordered for placement    Cherylin Mylar 06/18/2018, 12:09 PM

## 2018-06-19 ENCOUNTER — Inpatient Hospital Stay (HOSPITAL_COMMUNITY): Payer: Medicare Other

## 2018-06-19 DIAGNOSIS — G931 Anoxic brain damage, not elsewhere classified: Secondary | ICD-10-CM

## 2018-06-19 LAB — APTT: aPTT: 75 seconds — ABNORMAL HIGH (ref 24–36)

## 2018-06-19 LAB — POCT I-STAT, CHEM 8
BUN: 43 mg/dL — AB (ref 8–23)
BUN: 57 mg/dL — ABNORMAL HIGH (ref 8–23)
BUN: 40 mg/dL — ABNORMAL HIGH (ref 8–23)
BUN: 54 mg/dL — ABNORMAL HIGH (ref 8–23)
CHLORIDE: 98 mmol/L (ref 98–111)
CHLORIDE: 97 mmol/L — AB (ref 98–111)
CREATININE: 4.4 mg/dL — AB (ref 0.61–1.24)
CREATININE: 2.7 mg/dL — AB (ref 0.61–1.24)
CREATININE: 4.1 mg/dL — AB (ref 0.61–1.24)
Calcium, Ion: 0.59 mmol/L — CL (ref 1.15–1.40)
Calcium, Ion: 1.1 mmol/L — ABNORMAL LOW (ref 1.15–1.40)
Calcium, Ion: 0.54 mmol/L — CL (ref 1.15–1.40)
Calcium, Ion: 1.1 mmol/L — ABNORMAL LOW (ref 1.15–1.40)
Chloride: 95 mmol/L — ABNORMAL LOW (ref 98–111)
Chloride: 93 mmol/L — ABNORMAL LOW (ref 98–111)
Creatinine, Ser: 3 mg/dL — ABNORMAL HIGH (ref 0.61–1.24)
GLUCOSE: 230 mg/dL — AB (ref 70–99)
Glucose, Bld: 231 mg/dL — ABNORMAL HIGH (ref 70–99)
Glucose, Bld: 203 mg/dL — ABNORMAL HIGH (ref 70–99)
Glucose, Bld: 259 mg/dL — ABNORMAL HIGH (ref 70–99)
HCT: 28 % — ABNORMAL LOW (ref 39.0–52.0)
HCT: 25 % — ABNORMAL LOW (ref 39.0–52.0)
HEMATOCRIT: 24 % — AB (ref 39.0–52.0)
HEMATOCRIT: 27 % — AB (ref 39.0–52.0)
HEMOGLOBIN: 8.2 g/dL — AB (ref 13.0–17.0)
Hemoglobin: 9.5 g/dL — ABNORMAL LOW (ref 13.0–17.0)
Hemoglobin: 8.5 g/dL — ABNORMAL LOW (ref 13.0–17.0)
Hemoglobin: 9.2 g/dL — ABNORMAL LOW (ref 13.0–17.0)
POTASSIUM: 4.5 mmol/L (ref 3.5–5.1)
POTASSIUM: 4.9 mmol/L (ref 3.5–5.1)
POTASSIUM: 4.2 mmol/L (ref 3.5–5.1)
POTASSIUM: 4.7 mmol/L (ref 3.5–5.1)
Sodium: 131 mmol/L — ABNORMAL LOW (ref 135–145)
Sodium: 128 mmol/L — ABNORMAL LOW (ref 135–145)
Sodium: 128 mmol/L — ABNORMAL LOW (ref 135–145)
Sodium: 131 mmol/L — ABNORMAL LOW (ref 135–145)
TCO2: 27 mmol/L (ref 22–32)
TCO2: 26 mmol/L (ref 22–32)
TCO2: 27 mmol/L (ref 22–32)
TCO2: 29 mmol/L (ref 22–32)

## 2018-06-19 LAB — GLUCOSE, CAPILLARY
GLUCOSE-CAPILLARY: 137 mg/dL — AB (ref 70–99)
GLUCOSE-CAPILLARY: 162 mg/dL — AB (ref 70–99)
Glucose-Capillary: 175 mg/dL — ABNORMAL HIGH (ref 70–99)
Glucose-Capillary: 162 mg/dL — ABNORMAL HIGH (ref 70–99)
Glucose-Capillary: 112 mg/dL — ABNORMAL HIGH (ref 70–99)
Glucose-Capillary: 121 mg/dL — ABNORMAL HIGH (ref 70–99)

## 2018-06-19 LAB — POCT I-STAT 3, ART BLOOD GAS (G3+)
Acid-Base Excess: 1 mmol/L (ref 0.0–2.0)
Bicarbonate: 25.9 mmol/L (ref 20.0–28.0)
O2 Saturation: 99 %
PCO2 ART: 43.4 mmHg (ref 32.0–48.0)
TCO2: 27 mmol/L (ref 22–32)
pH, Arterial: 7.382 (ref 7.350–7.450)
pO2, Arterial: 161 mmHg — ABNORMAL HIGH (ref 83.0–108.0)

## 2018-06-19 LAB — CULTURE, BLOOD (ROUTINE X 2)
Culture: NO GROWTH
Culture: NO GROWTH
Special Requests: ADEQUATE

## 2018-06-19 LAB — RENAL FUNCTION PANEL
ALBUMIN: 1.5 g/dL — AB (ref 3.5–5.0)
Albumin: 1.5 g/dL — ABNORMAL LOW (ref 3.5–5.0)
Anion gap: 10 (ref 5–15)
Anion gap: 13 (ref 5–15)
BUN: 56 mg/dL — AB (ref 8–23)
BUN: 65 mg/dL — AB (ref 8–23)
CHLORIDE: 98 mmol/L (ref 98–111)
CO2: 22 mmol/L (ref 22–32)
CO2: 22 mmol/L (ref 22–32)
CREATININE: 3.63 mg/dL — AB (ref 0.61–1.24)
Calcium: 8.1 mg/dL — ABNORMAL LOW (ref 8.9–10.3)
Calcium: 8.3 mg/dL — ABNORMAL LOW (ref 8.9–10.3)
Chloride: 96 mmol/L — ABNORMAL LOW (ref 98–111)
Creatinine, Ser: 4.17 mg/dL — ABNORMAL HIGH (ref 0.61–1.24)
GFR calc non Af Amer: 15 mL/min — ABNORMAL LOW (ref >60)
GFR, EST AFRICAN AMERICAN: 18 mL/min — AB (ref >60)
GFR, EST AFRICAN AMERICAN: 15 mL/min — AB (ref >60)
GFR, EST NON AFRICAN AMERICAN: 13 mL/min — AB (ref >60)
GLUCOSE: 200 mg/dL — AB (ref 70–99)
Glucose, Bld: 136 mg/dL — ABNORMAL HIGH (ref 70–99)
PHOSPHORUS: 5.8 mg/dL — AB (ref 2.5–4.6)
POTASSIUM: 5.4 mmol/L — AB (ref 3.5–5.1)
Phosphorus: 4.3 mg/dL (ref 2.5–4.6)
Potassium: 4.3 mmol/L (ref 3.5–5.1)
SODIUM: 130 mmol/L — AB (ref 135–145)
Sodium: 131 mmol/L — ABNORMAL LOW (ref 135–145)

## 2018-06-19 LAB — CBC
HCT: 25.7 % — ABNORMAL LOW (ref 39.0–52.0)
HCT: 23.2 % — ABNORMAL LOW (ref 39.0–52.0)
HEMOGLOBIN: 8 g/dL — AB (ref 13.0–17.0)
Hemoglobin: 8.1 g/dL — ABNORMAL LOW (ref 13.0–17.0)
MCH: 28 pg (ref 26.0–34.0)
MCH: 30.5 pg (ref 26.0–34.0)
MCHC: 31.5 g/dL (ref 30.0–36.0)
MCHC: 34.5 g/dL (ref 30.0–36.0)
MCV: 88.9 fL (ref 80.0–100.0)
MCV: 88.5 fL (ref 80.0–100.0)
NRBC: 1.1 % — AB (ref 0.0–0.2)
Platelets: 590 10*3/uL — ABNORMAL HIGH (ref 150–400)
Platelets: 512 10*3/uL — ABNORMAL HIGH (ref 150–400)
RBC: 2.89 MIL/uL — ABNORMAL LOW (ref 4.22–5.81)
RBC: 2.62 MIL/uL — AB (ref 4.22–5.81)
RDW: 19.2 % — AB (ref 11.5–15.5)
RDW: 19.5 % — AB (ref 11.5–15.5)
WBC: 21.9 10*3/uL — ABNORMAL HIGH (ref 4.0–10.5)
WBC: 18.9 10*3/uL — AB (ref 4.0–10.5)
nRBC: 2 % — ABNORMAL HIGH (ref 0.0–0.2)

## 2018-06-19 LAB — MAGNESIUM: Magnesium: 3 mg/dL — ABNORMAL HIGH (ref 1.7–2.4)

## 2018-06-19 LAB — HEPARIN LEVEL (UNFRACTIONATED)
HEPARIN UNFRACTIONATED: 0.51 [IU]/mL (ref 0.30–0.70)
Heparin Unfractionated: 0.37 IU/mL (ref 0.30–0.70)

## 2018-06-19 LAB — CK: Total CK: 2847 U/L — ABNORMAL HIGH (ref 49–397)

## 2018-06-19 LAB — PROTIME-INR
INR: 1.13
PROTHROMBIN TIME: 14.4 s (ref 11.4–15.2)

## 2018-06-19 LAB — PROCALCITONIN: PROCALCITONIN: 17.22 ng/mL

## 2018-06-19 MED ORDER — PRISMASOL BGK 4/2.5 32-4-2.5 MEQ/L REPLACEMENT SOLN
Status: DC
  Filled 2018-06-19 (×3): qty 5000

## 2018-06-19 MED ORDER — PRISMASOL BGK 4/2.5 32-4-2.5 MEQ/L REPLACEMENT SOLN
Status: DC
  Administered 2018-06-19: 300 mL via INTRAVENOUS_CENTRAL
  Administered 2018-06-20: 01:00:00 via INTRAVENOUS_CENTRAL
  Filled 2018-06-19 (×3): qty 5000

## 2018-06-19 MED ORDER — VASOPRESSIN 20 UNIT/ML IV SOLN
0.0300 [IU]/min | INTRAVENOUS | Status: DC
  Administered 2018-06-19 – 2018-06-20 (×2): 0.03 [IU]/min via INTRAVENOUS
  Filled 2018-06-19 (×2): qty 2

## 2018-06-19 MED ORDER — MIDAZOLAM HCL 2 MG/2ML IJ SOLN
1.0000 mg | INTRAMUSCULAR | Status: DC | PRN
  Administered 2018-06-20 – 2018-06-28 (×11): 2 mg via INTRAVENOUS
  Filled 2018-06-19 (×12): qty 2

## 2018-06-19 MED ORDER — INSULIN ASPART 100 UNIT/ML ~~LOC~~ SOLN
0.0000 [IU] | SUBCUTANEOUS | Status: DC
  Administered 2018-06-19: 3 [IU] via SUBCUTANEOUS
  Administered 2018-06-20: 2 [IU] via SUBCUTANEOUS
  Administered 2018-06-20: 1 [IU] via SUBCUTANEOUS
  Administered 2018-06-20: 2 [IU] via SUBCUTANEOUS
  Administered 2018-06-20: 3 [IU] via SUBCUTANEOUS
  Administered 2018-06-20 (×2): 2 [IU] via SUBCUTANEOUS
  Administered 2018-06-21: 1 [IU] via SUBCUTANEOUS
  Administered 2018-06-21: 2 [IU] via SUBCUTANEOUS
  Administered 2018-06-21 – 2018-06-22 (×3): 1 [IU] via SUBCUTANEOUS
  Administered 2018-06-22 (×2): 2 [IU] via SUBCUTANEOUS
  Administered 2018-06-22 – 2018-06-23 (×6): 1 [IU] via SUBCUTANEOUS
  Administered 2018-06-23: 2 [IU] via SUBCUTANEOUS
  Administered 2018-06-24 – 2018-06-27 (×11): 1 [IU] via SUBCUTANEOUS
  Administered 2018-06-27: 2 [IU] via SUBCUTANEOUS
  Administered 2018-06-28: 1 [IU] via SUBCUTANEOUS

## 2018-06-19 MED ORDER — INSULIN ASPART 100 UNIT/ML ~~LOC~~ SOLN
2.0000 [IU] | SUBCUTANEOUS | Status: DC
  Administered 2018-06-19 – 2018-06-23 (×19): 2 [IU] via SUBCUTANEOUS

## 2018-06-19 MED ORDER — DEXTROSE 5 % IV SOLN
Status: DC
  Filled 2018-06-19: qty 1500

## 2018-06-19 MED ORDER — "THROMBI-PAD 3""X3"" EX PADS"
1.0000 | MEDICATED_PAD | Freq: Once | CUTANEOUS | Status: AC
  Administered 2018-06-19: 1 via TOPICAL
  Filled 2018-06-19: qty 1

## 2018-06-19 MED ORDER — DEXTROSE 5 % IV SOLN
Status: DC
  Administered 2018-06-19 – 2018-06-20 (×3): via INTRAVENOUS_CENTRAL
  Filled 2018-06-19 (×4): qty 1500

## 2018-06-19 MED ORDER — DEXTROSE 5 % IV SOLN
20.0000 g | INTRAVENOUS | Status: DC
  Administered 2018-06-19 – 2018-06-20 (×3): 20 g via INTRAVENOUS_CENTRAL
  Filled 2018-06-19 (×4): qty 200

## 2018-06-19 MED ORDER — PRISMASOL BGK 4/2.5 32-4-2.5 MEQ/L IV SOLN
INTRAVENOUS | Status: DC
  Administered 2018-06-19 – 2018-06-22 (×17): via INTRAVENOUS_CENTRAL
  Filled 2018-06-19 (×22): qty 5000

## 2018-06-19 MED ORDER — PIPERACILLIN-TAZOBACTAM 3.375 G IVPB 30 MIN
3.3750 g | Freq: Once | INTRAVENOUS | Status: AC
  Administered 2018-06-19: 3.375 g via INTRAVENOUS
  Filled 2018-06-19 (×2): qty 50

## 2018-06-19 MED ORDER — PIPERACILLIN-TAZOBACTAM 3.375 G IVPB 30 MIN
3.3750 g | Freq: Four times a day (QID) | INTRAVENOUS | Status: DC
  Administered 2018-06-19 – 2018-06-22 (×12): 3.375 g via INTRAVENOUS
  Filled 2018-06-19 (×24): qty 50

## 2018-06-19 MED ORDER — SODIUM CHLORIDE 0.9 % FOR CRRT
INTRAVENOUS_CENTRAL | Status: DC | PRN
  Filled 2018-06-19: qty 1000

## 2018-06-19 MED ORDER — "THROMBI-PAD 3""X3"" EX PADS"
2.0000 | MEDICATED_PAD | Freq: Once | CUTANEOUS | Status: AC
  Administered 2018-06-19: 2 via TOPICAL
  Filled 2018-06-19: qty 2

## 2018-06-19 NOTE — Progress Notes (Signed)
Pharmacy Antibiotic Note  Bryan Parker is a 72 y.o. male admitted on June 13, 2018 with possible aspiration pna/sepsis. Pharmacy has been reconsulted to start Zosyn. Off CRRT since yesterday. Tmax 100.5, WBC up to 21.9, PCT 150 > 75.62. Resent TA cultures today.   Patient now febrile. As continuing on vancomycin, had ordered vancomycin trough today however trought was drawn in incorrect type of tube per lab. Vancomycin dose has already been given @ 17:00, thus will have to check vanc trough tomorrow.     CRRT started @ 18:00 tonight.. May need to adjust vancomycin dose for CRRT, plan to check vanc trough tomorrow prior to next dose.    Plan: Vancomycin 750 mg IV q24h   Vancomycin trough tomorrow @ 16:00 prior to next dose. Hold tomorrow's vanc dose until pharmacist evaluates trough result. Zosyn 3.375 g IV q6h (CRRT dosing)    Height: 5\' 10"  (177.8 cm) Weight: 143 lb 1.3 oz (64.9 kg) IBW/kg (Calculated) : 73  Temp (24hrs), Avg:98.6 F (37 C), Min:97.5 F (36.4 C), Max:100.5 F (38.1 C)  Recent Labs  Lab 06/15/18 0347  06/16/18 0340  06/17/18 0353 06/17/18 1727 06/18/18 0338 06/18/18 1550 06/19/18 0419 06/19/18 1636  WBC 21.3*  --  18.6*  --  21.7*  --  22.7*  --  21.9*  --   CREATININE 2.12*   < > 2.13*   < > 1.74* 1.60* 1.48* 2.55* 3.63* 4.17*   < > = values in this interval not displayed.    Estimated Creatinine Clearance: 14.9 mL/min (A) (by C-G formula based on SCr of 4.17 mg/dL (H)).    No Known Allergies  Dose adjustments this admission: 10/23 VR = 22 - vanc 750 mg q24h, pt started on CRRT (zosyn adjusted for CRRT) 10/25 VR = 36 - drawn after vancomycin was given   Antimicrobials:  Zosyn 10/20>>  Vancomycin 10/20 >> 10/25  Cultures: 11/3: TA pending 10/29 BCx: Negative 10/29 TA: few candida, rare GPC  10/20 BCx: Negative 10/20 TA: normal flora 10/20 MRSA PCR: negative    Thank you for involving pharmacy in this patient's care. Noah Delaine, RPh Clinical  Pharmacist Please check AMION for all Pharmacist numbers by unit 06/19/2018 7:07 PM

## 2018-06-19 NOTE — Procedures (Signed)
Bronchoscopy Procedure Note Bryan Parker [id number] 1946/04/22  Procedure: Bronchoscopy Indications: Evaluation of tracheostomy bleeding   Procedure Details Consent: Risks of procedure as well as the alternatives and risks of each were explained to the (patient/caregiver).  Consent for procedure obtained. Wife at bedside  Time Out: Verified patient identification, verified procedure, site/side was marked, verified correct patient position, special equipment/implants available, medications/allergies/relevent history reviewed, required imaging and test results available.  Performed  In preparation for procedure, patient was given 100% FiO2 and bronchoscope lubricated. Sedation: on propofol and fentanyl ggt in ICU   Airway entered and the following bronchi were examined: The main trachea was visualized. There was no bleeding in to the airway. I attempted to visualize from above through the oropharynx with a oral airway in place but was unable to access due to a large tongue and the plastic bedside bronchoscope was not stiff enough to lift the tongue and allow visualization of the vocal cords. I did not want to enter the nare due to risk of nasal bleeding. The bronchoscope was used to suction thick secretions from both main stems.   Bronchoscope was removed.    Evaluation Hemodynamic Status: BP stable throughout; O2 sats: stable throughout Patient's Current Condition: stable Specimens:  None Complications: No apparent complications Patient did tolerate procedure well.   [name], DO [facility] 06/19/2018 10:51 AM  Personal pager: [phone] If unanswered, please page CCM On-call: [phone]

## 2018-06-19 NOTE — Progress Notes (Signed)
Pharmacy Antibiotic Note  Bryan Parker is a 72 y.o. male admitted on 06-19-2018 with possible aspiration pna/sepsis. Pharmacy has been reconsulted to start Zosyn. Off CRRT since yesterday. Tmax 100.5, WBC up to 21.9, PCT 150 > 75.62. Resent TA cultures today.   Patient now febrile. As continuing on vancomycin, will obtain trough tonight to ensure safety/efficacy of therapy. Patient likely will be starting CRRT in the next few hours. Will order follow up Zosyn once CRRT has been started.   Plan: Vancomycin trough later this afternoon Vancomycin 750 mg IV q24h Zosyn 3.375 g x1    Height: 5\' 10"  (177.8 cm) Weight: 143 lb 1.3 oz (64.9 kg) IBW/kg (Calculated) : 73  Temp (24hrs), Avg:99.1 F (37.3 C), Min:97.5 F (36.4 C), Max:100.5 F (38.1 C)  Recent Labs  Lab 06/15/18 0347  06/16/18 0340  06/17/18 0353 06/17/18 1727 06/18/18 0338 06/18/18 1550 06/19/18 0419  WBC 21.3*  --  18.6*  --  21.7*  --  22.7*  --  21.9*  CREATININE 2.12*   < > 2.13*   < > 1.74* 1.60* 1.48* 2.55* 3.63*   < > = values in this interval not displayed.    Estimated Creatinine Clearance: 17.1 mL/min (A) (by C-G formula based on SCr of 3.63 mg/dL (H)).    No Known Allergies  Dose adjustments this admission: 10/23 VR = 22 - vanc 750 mg q24h, pt started on CRRT (zosyn adjusted for CRRT) 10/25 VR = 36 - drawn after vancomycin was given   Antimicrobials:  Zosyn 10/20>>  Vancomycin 10/20 >> 10/25  Cultures: 11/3: TA 10/29 BCx: NGx2 days 10/29 TA: few candida, rare GPC  10/20 BCx: NGTD 10/20 TA: normal flora 10/20 MRSA PCR: negative    Thank you for involving pharmacy in this patient's care.   Marcelino Freestone, PharmD PGY2 Cardiology Pharmacy Resident Phone [phone] Please check AMION for all Pharmacist numbers by unit 06/19/2018 12:04 PM

## 2018-06-19 NOTE — Progress Notes (Addendum)
PCCM:  Alerted by nursing staff that the patient was having ongoing bleeding from tracheostomy.  During the middle the night the patient had thrombin pads placed underneath trach.  Continue to have ongoing bleeding.   Patient currently on IV heparin following cardiac arrest concern for PE. Hgb dropped from 9.6 to 8.6.  Attempted to visualized the base of the incision. I removed gauze from around the trach. There was a large 4 inch clot wrapped around the base of the tracheostomy. This was removed for better visualization. Lifting the left side of the trach there was red blood running out onto the left chest. Holding the trach you could feel pulsatility. I at this point I stopped any attempt at bedside hemostasis feeling as if I moved or loosened the trach we could loose tamponade of current vessels.   I called Dr. Molli Knock who placed the trach and discussed a care plan. I call trauma surgery service who recommended ENT consultation. I spoke to Dr. Suszanne Conners from ENT who recommended gauze packing and if actively bleeding will take to OR.   [name], DO [facility] 06/19/2018 10:09 AM  Personal pager: [phone] If unanswered, please page CCM On-call: #[phone]    I reprepped the neck with chlorhexidine and used sterile saline to rinse above and below. I used the bedside bronchoscope to visualize the trachea and confirm not bleeding into the airway. Used kelly forceps and gauze to pack the upper and lower incision site to best of my ability underneath the trach phlange without removing the tracheostomy sutures and jeopardizing loss of the airway.   If he continues to have ongoing bleeding we will need our surgical colleagues to evaluate for hemostasis.   [name], DO [facility] 06/19/2018 10:56 AM  Personal pager: [phone] If unanswered, please page CCM On-call: #[phone]   My additional CC time not including procedures  documented was 55 mins.   [name], DO [facility] 06/19/2018 3:07 PM  Personal pager: [phone] If unanswered, please page CCM On-call: #[phone]

## 2018-06-19 NOTE — Progress Notes (Addendum)
[facility] Progress Note    Assessment/ Plan:   72 year old BM with CKD- (1.5 to 2.0 in 2016, upon presentation was 2.5) and history of PE who presented with PEA arrest, presumed PE with hemodynamic instability and A on CRF 1.Renal-acute on chronic kidney disease. Not sure of the etiology of his chronic kidney disease. However, his acute is most likely secondary to his multisystem organ failure and prolonged hypotension requiring pressors. He is oliguric now requiring renal replacement therapy. CRRT started 10/23. Running well, no heparin, all 4 k bags. I do not think that rhabdo is the cause of his AKI- is probably from code and being in ICU for several days- there is not an effective way to treat rhabdo in an oliguric pt who is already volume overloaded- just need to let it reabsorb  - Restart RRT today; no UOP. Bladder scan today was 0 again; no foley. Net UF 0.  - I don't think he will tolerate iHD as Levo req are incr c/w yest.  2. Hypertension/volume- initiallymassively volume overloadedand still w/ significant edema. 3.Hyperkalemia-resolved 4. Anemia-situational and multifactorial. Has been transfused this admission-supportive care, per CCM- transfuse as needed- addedESA 5.Metabolic acidosis- improved 6.Hemodynamic instability-multiple pressors with inability to wean. Cardiogenic versus septic shock per cardiology and CCM- some progress.  7. Gross hematuria- Renal u/s normal (no hydro) on 10/20, - urology placed cath- achieving good drainage for now, just very little UOP, nurse had beenirrigatingwhen the foley was in.  -Minimal UOP; therefore cath was pulled 10/31. Repeat bladder scan today  was again 0.   Subjective:   Trach yest. Pt sedated on vent on Levophed.    Objective:   BP 107/63   Pulse 86   Temp (!) 97.5 F (36.4 C) (Axillary)   Resp 14   Ht 5' 10"  (1.778 m)   Wt 64.9 kg   SpO2 100%   BMI 20.53 kg/m   Intake/Output  Summary (Last 24 hours) at 06/19/2018 0815 Last data filed at 06/19/2018 0700 Gross per 24 hour  Intake 1836.83 ml  Output 350 ml  Net 1486.83 ml   Weight change: 2.2 kg  Physical Exam: General: sedated , intubated  Heart:tachy Lungs:clear, trach Abdomen: distended, no BS Extremities: pitting edemaup to thighsstill present Dialysis Access: right IJ temp cath placed 10/23 GU: foleyremoved  Imaging: Ct Head Wo Contrast  Result Date: 06/18/2018 CLINICAL DATA:  Focal neuro deficit.  Pupil change. EXAM: CT HEAD WITHOUT CONTRAST TECHNIQUE: Contiguous axial images were obtained from the base of the skull through the vertex without intravenous contrast. COMPARISON:  CT head 06/15/2018 FINDINGS: Brain: Mild atrophy. Negative for acute infarct, hemorrhage, or mass. Vascular: Negative for hyperdense vessel Skull: Negative Sinuses/Orbits: Mild mucosal edema paranasal sinuses.  Normal orbit Other: None IMPRESSION: No acute abnormality.  No change from the recent study. Electronically Signed   By: Franchot Gallo M.D.   On: 06/18/2018 21:15   Dg Chest Port 1 View  Result Date: 06/19/2018 CLINICAL DATA:  Respiratory failure. EXAM: PORTABLE CHEST 1 VIEW COMPARISON:  06/18/2018 FINDINGS: There has been a placement of feeding catheter, descending below the diaphragm, tip collimated off the image. Right internal jugular approach venous catheter sheath and tracheostomy tube are unchanged. Cardiomediastinal silhouette is normal. Mediastinal contours appear intact. There is no evidence of lobar airspace consolidation, pleural effusion or pneumothorax. Minimal peribronchial airspace consolidation versus atelectasis the lung bases. Osseous structures are without acute abnormality. Soft tissues are grossly normal. IMPRESSION: Minimal peribronchial airspace consolidation  versus atelectasis in the lung bases. Electronically Signed   By: Fidela Salisbury M.D.   On: 06/19/2018 08:00   Dg Chest Port 1  View  Result Date: 06/18/2018 CLINICAL DATA:  Tracheostomy tube change. EXAM: PORTABLE CHEST 1 VIEW COMPARISON:  Chest x-ray dated June 16, 2018. FINDINGS: New tracheostomy tube in good position with the tip at the thoracic inlet. Unchanged right internal jugular central venous catheter. Interval removal of the enteric tube. The heart size and mediastinal contours are within normal limits. Normal pulmonary vascularity. Improving aeration of the left lower lobe. Unchanged small left pleural effusion. No pneumothorax. No acute osseous abnormality. IMPRESSION: 1. Tracheostomy tube in good position. 2. Slightly improved aeration in the left lower lobe. Unchanged small left pleural effusion. Electronically Signed   By: Titus Dubin M.D.   On: 06/18/2018 15:38    Labs: BMET Recent Labs  Lab 06/16/18 0340 06/16/18 1711 06/17/18 0353 06/17/18 1727 06/18/18 0338 06/18/18 1550 06/19/18 0419  NA 134* 135 135 136 134* 133* 130*  K 4.2 4.4 4.2 4.6 4.9 4.8 4.3  CL 101 101 102 100 102 98 98  CO2 25 24 24 26 24 22 22   GLUCOSE 136* 166* 137* 144* 115* 118* 200*  BUN 26* 29* 30* 30* 28* 42* 56*  CREATININE 2.13* 1.89* 1.74* 1.60* 1.48* 2.55* 3.63*  CALCIUM 7.8* 8.1* 7.8* 8.3* 8.3* 8.4* 8.1*  PHOS 1.9* 2.2* 2.0* 2.3* 2.5 3.0 4.3   CBC Recent Labs  Lab 06/16/18 0340 06/17/18 0353 06/18/18 0338 06/19/18 0419  WBC 18.6* 21.7* 22.7* 21.9*  HGB 9.7* 9.4* 9.6* 8.1*  HCT 30.0* 29.8* 29.3* 25.7*  MCV 88.8 88.7 87.2 88.9  PLT 388 496* 572* 590*    Medications:    . chlorhexidine gluconate (MEDLINE KIT)  15 mL Mouth Rinse BID  . Chlorhexidine Gluconate Cloth  6 each Topical Q0600  . darbepoetin (ARANESP) injection - NON-DIALYSIS  200 mcg Subcutaneous Q Sun-1800  . hydrALAZINE  25 mg Oral Q8H  . mouth rinse  15 mL Mouth Rinse 10 times per day  . metoCLOPramide (REGLAN) injection  5 mg Intravenous Q6H  . pantoprazole sodium  40 mg Per Tube Q1200  . QUEtiapine  150 mg Oral BID  . sodium  chloride flush  10-40 mL Intracatheter Q12H  . THROMBI-PAD  1 each Topical Once      Otelia Santee, MD 06/19/2018, 8:15 AM

## 2018-06-19 NOTE — Progress Notes (Signed)
NAME:  Bryan Parker, MRN:  [id number], DOB:  May 11, 1946, LOS: 14 ADMISSION DATE:  06/09/2018, CONSULTATION DATE:  05/23/2018 REFERRING MD:  Dr. Rhunette Croft , CHIEF COMPLAINT:  Cardiac Arrest    Brief History   72 year old male presents to ED s/p Cardiac Arrest. Per Report patient called EMS for Abdominal Pain/Nausea/Vomiting. When EMS arrived patient was alert however went pulseless and PEA while on scene. ROSC achieved after 2 EPI and 10 minutes CPR. On arrival patient continued to be unstable and required multiple rounds of CPR. Fast exam negative. Bedside ECHO revealed slightly dilated RV. ABG 6.914/69.5/368. Progressive hypotension requiring Levophed/EPI/Vasopresin and 9 Bicarb pushes.    Wife reports that patient was complaining of severe left calf pain all day 10/19. Has a history of PE 2016. Was taken off anticoagulation 2018. Due to high suspicion of PE TNKase was given in ED. PCCM asked to admit.   Past Medical History  H/O DVT/PE, CKD  Significant Hospital Events   10/20 > Presents to ED  1021 with a partial code no CPR 10/22 bleeding from IV sites and foley, heparin d/c'd for 2 hours then resumed. Hgb 6.8 > 1u PRBC transfused 10/23 CRRT started.  Giapreza added. 10/24 coude catheter placed by urology and CBI started. 10/25 - Pressor requirements much improved after giaprezza added 10/23; however, remains on vaso0.03, NE 23, epi 10, dobut 20, giapreza 25. CK up from 14.5 on 10/24 up to 35K on 10/25.  CRRT running.  10/26 - CK 31,000 (was 34,000 yesterday) . Volume removal continues. Fluid challenge strategy cancelled. CK better. Legs thinner with volume removal. Pressors: off giaprezza, levophed 6, dobutamine 10, epi 16 and vaso 0.03. Had vomit yesterday and TF on hold. Has bloody urine - seen by urology  10/27  - pressor needs coming down - off epi gtt. Dobutamine 10, Vaso 0.03 and levophed 4-24mcg/min. . Still largely volume . Volume removal via CRRT. CK down to 24K. TF still on hold.   10/28 brady , hypotensive with precedex 11/2>> Trach   Consults: date of consult/date signed off & final recs:  PCCM 10/20 Heart failure 10/21 >  Urology 10/24 >  renal  Procedures (surgical and bedside):  ETT 10/20 >>11/2 Trach (JY) 11/2>>> Right Femoral CVC 10/20 >> 10/29 Left Femoral Aline 10/20 >> 10/30 L IJ HD cath 10/23 >  Rt radial a line 10/30 >>  Significant Diagnostic Tests:  CT Head 10/20 >> neg Echo 10/20 > EF 55 - 60%, severely dilated RV, mod dilated RA, mod TR. CT chest/abd/ pelvis 10/30  >> no bleed CT head 10/30 neg  Micro Data:  Blood 10/20 >> neg Sputum 10/20 >> neg U/A 10/20 >> neg Blood 10/29 > ng resp 10/29  >>ng Sputum 10/29>> GS few gram +>>  Few candida albicans Blood 10/29>>   Antimicrobials:  zosyn 10/20 >> vanc 10/20 >>off, 10/29 >>  SUBJECTIVE/OVERNIGHT/INTERVAL HX   Some bleeding from the trach overnight No events overnight, no new complaints  Objective   Blood pressure (!) 100/53, pulse 90, temperature (!) 97.5 F (36.4 C), temperature source Axillary, resp. rate 14, height 5\' 10"  (1.778 m), weight 64.9 kg, SpO2 100 %.    Vent Mode: PRVC FiO2 (%):  [40 %] 40 % Set Rate:  [14 bmp] 14 bmp Vt Set:  [500 mL] 500 mL PEEP:  [5 cmH20] 5 cmH20 Plateau Pressure:  [12 cmH20-17 cmH20] 12 cmH20   Intake/Output Summary (Last 24 hours) at 06/19/2018 1136 Last data filed  at 06/19/2018 1000 Gross per 24 hour  Intake 2069.87 ml  Output 350 ml  Net 1719.87 ml   Filed Weights   06/17/18 0500 06/18/18 0500 06/19/18 0600  Weight: 65 kg 62.7 kg 64.9 kg    Chronic critically ill, remains agitated when sedation weaned Icterus +, mild pallor Bilateral chest excursion, Decreased breath sounds bilaterally Soft and non tender, ND, BS +  Essentially no UO, bladder scan with 0 cc's UO Does not follow commands, RASS -2 Cool extremities, dusky toes & feet  I reviewed CXR myself, trach is in a good position  Assessment & Plan:   Acute resp  failure - s/p intubation in ED.  PLAN - Hold PS trials today, will keep him sedate to prevent him from thrashing in bed and dislodging trach and making the trach bleeding worse. - Titrate fentanyl and propofol to minimal dose needed   Trach bleed: - Sutures removed - Thrombipad placed  PEA Cardiac Arrest - concern due to massive PE.   Shock - likely mixed, obstructive due to above and likely septic/HCAP vs aspiration.given PCT > 150. - Resolved New shock 10/29 likely due to sepsis - resolved plan - Cardiology following - D/C pressors  Presumed PE - s/p TPA in ED. LLE DVT. Hx PE / DVT 2016 (no longer on anticoagulation). Plan: Hold heparin for today, restart in AM if not bleeding overnight after measures taken above Aranesp CBC in AM  New fever 10/29, WBC rising again - T max 100.8 - All femoral lines dc'd & defervesced,CT non diagnostic Plan: - Complete course of zosyn , ct vanc x 7 days per pharmacy - Sputum with few candida albicans - Blood Cultures pending follow results - PCT remains elevated at 14.11, continue to trend - Trend fever and WBC  AKI on Chronic Kidney Failure - likely exacerbated by arrest and rhabdo.  Now on CRRT. Rhabdomyolysis - CK peaked at 35K on 10/25 now down to 7k  Hyponatremia - Nephrology following - CRRT off, HD per renal - Follow CK  intermittent  Acute blood loss anemia - had bleeding from IV sites and foley - about 5-6 U PRBC so far Foley cath has been removed HGB stable at present PLAN - Maintain Hgb > 8 following PEA arrest.   Urethral bleeding - likely traumatic following tPA administration. - Urology following, doubt need for cystoscopy. - Foley discontinued   Hyperglycemia. - Continue SSI.  Acute Encephalopathy - metabolic + from PEA arrest. - Propofol x24 more hours - Fentanyl - QTc monitoring  Vomit 10/25/19KUB - no ileus PLAN  -TF @ goal  Thrombocytopenia.-  Resolved Trend CBC   Disposition / Summary of  Today's Plan 06/19/18   As above  Code Status: 06/07/2018 partial code no CPR Family Communication: No family at bedside 11/2 while rounding  ATTESTATION & SIGNATURE   The patient is critically ill with multiple organ systems failure and requires high complexity decision making for assessment and support, frequent evaluation and titration of therapies, application of advanced monitoring technologies and extensive interpretation of multiple databases.   Critical Care Time devoted to patient care services described in this note is  45  Minutes. This time reflects time of care of this signee Dr Koren Bound. This critical care time does not reflect procedure time, or teaching time or supervisory time of PA/NP/Med student/Med Resident etc but could involve care discussion time.  Alyson Reedy, M.D. [facility] Pulmonary/Critical Care Medicine. Pager: [phone]. After hours pager: [phone].

## 2018-06-19 NOTE — Progress Notes (Signed)
Dr. Juel Burrow notified that CRRT initiation would be delayed r/t events with trach bleeding.

## 2018-06-19 NOTE — Progress Notes (Signed)
ANTICOAGULATION CONSULT NOTE   Pharmacy Consult for Heparin Indication: suspected pulmonary embolus and confirmed DVT  No Known Allergies  Patient Measurements: Height: 5\' 10"  (177.8 cm) Weight: 143 lb 1.3 oz (64.9 kg) IBW/kg (Calculated) : 73  Vital Signs: Temp: 97.9 F (36.6 C) (11/03 0400) Temp Source: Axillary (11/03 0400) BP: 107/63 (11/03 0700) Pulse Rate: 86 (11/03 0700)  Labs: Recent Labs    06/17/18 0353  06/18/18 0338 06/18/18 0346 06/18/18 0715 06/18/18 1550 06/19/18 0100 06/19/18 0419  HGB 9.4*  --  9.6*  --   --   --   --  8.1*  HCT 29.8*  --  29.3*  --   --   --   --  25.7*  PLT 496*  --  572*  --   --   --   --  590*  LABPROT  --   --   --   --  14.0  --   --   --   INR  --   --   --   --  1.09  --   --   --   HEPARINUNFRC 0.48  --   --  <0.10*  --   --  0.37 0.51  CREATININE 1.74*   < > 1.48*  --   --  2.55*  --  3.63*  CKTOTAL  --   --   --   --   --   --   --  2,847*   < > = values in this interval not displayed.    Estimated Creatinine Clearance: 17.1 mL/min (A) (by C-G formula based on SCr of 3.63 mg/dL (H)).  Assessment: 72yo male with DVT and likely PE s/p PEA arrest and TNKase 10/20 for heparin. Currently on CRRT with minimal UOP.   Gross hematuria now resolved and anemia s/p pRBC 10/25.   Heparin restarted yesterday. Heparin level trending up, slightly above goal at 0.51 on heparin 1950 units/hr. Hemoglobin decreased to 8. Bleeding now noted from newly placed trach site, thrombi pads have been applied. No issues with infusion reported.  Goal of Therapy:  Heparin level 0.3-0.5 units/ml Monitor platelets by anticoagulation protocol: Yes   Plan:  Decrease heparin to 1900 units/hr Heparin level in ~8 hours Daily heparin level and CBC  Thanks for allowing pharmacy to be a part of this patient's care.  Marcelino Freestone, PharmD PGY2 Cardiology Pharmacy Resident Phone [phone] Please check AMION for all Pharmacist numbers by  unit 06/19/2018 7:15 AM ,

## 2018-06-19 NOTE — Plan of Care (Signed)
  Problem: Education: Goal: Knowledge of General Education information will improve Description Including pain rating scale, medication(s)/side effects and non-pharmacologic comfort measures  Outcome: Progressing   Problem: Health Behavior/Discharge Planning: Goal: Ability to manage health-related needs will improve Outcome: Progressing   Problem: Clinical Measurements: Goal: Ability to maintain clinical measurements within normal limits will improve Outcome: Progressing Goal: Will remain free from infection Outcome: Progressing Goal: Diagnostic test results will improve Outcome: Progressing Goal: Respiratory complications will improve Outcome: Progressing Goal: Cardiovascular complication will be avoided Outcome: Progressing   Problem: Activity: Goal: Risk for activity intolerance will decrease Outcome: Progressing   Problem: Nutrition: Goal: Adequate nutrition will be maintained Outcome: Progressing   Problem: Coping: Goal: Level of anxiety will decrease Outcome: Progressing   Problem: Elimination: Goal: Will not experience complications related to bowel motility Outcome: Progressing Goal: Will not experience complications related to urinary retention Outcome: Progressing   Problem: Pain Managment: Goal: General experience of comfort will improve Outcome: Progressing   Problem: Safety: Goal: Ability to remain free from injury will improve Outcome: Progressing   Problem: Skin Integrity: Goal: Risk for impaired skin integrity will decrease Outcome: Progressing   Problem: Activity: Goal: Ability to tolerate increased activity will improve Outcome: Progressing   Problem: Respiratory: Goal: Ability to maintain a clear airway and adequate ventilation will improve Outcome: Progressing   Problem: Role Relationship: Goal: Method of communication will improve Outcome: Progressing   Problem: Education: Goal: Knowledge about tracheostomy care/management will  improve Outcome: Progressing   Problem: Activity: Goal: Ability to tolerate increased activity will improve Outcome: Progressing   Problem: Health Behavior/Discharge Planning: Goal: Ability to manage tracheostomy will improve Outcome: Progressing   Problem: Respiratory: Goal: Patent airway maintenance will improve Outcome: Progressing   Problem: Role Relationship: Goal: Ability to communicate will improve Outcome: Progressing   

## 2018-06-19 NOTE — Progress Notes (Signed)
eLink Physician-Brief Progress Note Patient Name: Bryan Parker DOB: 1946-02-26 MRN: [id number]   Date of Service  06/19/2018  HPI/Events of Note  Multiple issues: 1. Hypotension - BP = 74/38. Patient on ceiling dose of Norepinephrine IV infusion and 2. Hyperglycemia - Blood glucose = 206.   eICU Interventions  Will order: 1. Increase ceiling on Norepinephrine IV infusion to 60 mcg/min. 2. Vasopressin IV infusion at shock dose. 3. ABG STAT. 4. Q 4 hour sensitive Novolog SSI - 2 units Q 4 hours tube feed coverage.     Intervention Category Major Interventions: Hyperglycemia - active titration of insulin therapy;Hypotension - evaluation and management  Lenell Antu 06/19/2018, 10:01 PM

## 2018-06-19 NOTE — Progress Notes (Signed)
Dr. Tonia Brooms called to bedside due to profuse bleeding from trach site. Received verbal orders to turn heparin off at 0915 as well as tube feeds off. Previous shift had placed thrombi pads underneath trach collar along with multiple gauze pads. Removed by Dr. Tonia Brooms and NP. Jackquline Berlin RN spoke with wife to update on pt status. Will continue to closely monitor

## 2018-06-20 ENCOUNTER — Inpatient Hospital Stay (HOSPITAL_COMMUNITY): Payer: Medicare Other

## 2018-06-20 DIAGNOSIS — R6521 Severe sepsis with septic shock: Secondary | ICD-10-CM

## 2018-06-20 DIAGNOSIS — A419 Sepsis, unspecified organism: Secondary | ICD-10-CM

## 2018-06-20 LAB — GLUCOSE, CAPILLARY
GLUCOSE-CAPILLARY: 107 mg/dL — AB (ref 70–99)
Glucose-Capillary: 192 mg/dL — ABNORMAL HIGH (ref 70–99)

## 2018-06-20 LAB — POCT I-STAT, CHEM 8
BUN: 30 mg/dL — ABNORMAL HIGH (ref 8–23)
BUN: 35 mg/dL — ABNORMAL HIGH (ref 8–23)
BUN: 36 mg/dL — ABNORMAL HIGH (ref 8–23)
BUN: 37 mg/dL — ABNORMAL HIGH (ref 8–23)
BUN: 38 mg/dL — ABNORMAL HIGH (ref 8–23)
BUN: 38 mg/dL — ABNORMAL HIGH (ref 8–23)
BUN: 39 mg/dL — ABNORMAL HIGH (ref 8–23)
BUN: 40 mg/dL — AB (ref 8–23)
BUN: 40 mg/dL — ABNORMAL HIGH (ref 8–23)
BUN: 44 mg/dL — AB (ref 8–23)
BUN: 47 mg/dL — ABNORMAL HIGH (ref 8–23)
BUN: 48 mg/dL — ABNORMAL HIGH (ref 8–23)
CALCIUM ION: 0.53 mmol/L — AB (ref 1.15–1.40)
CALCIUM ION: 0.55 mmol/L — AB (ref 1.15–1.40)
CALCIUM ION: 1.06 mmol/L — AB (ref 1.15–1.40)
CHLORIDE: 92 mmol/L — AB (ref 98–111)
CHLORIDE: 93 mmol/L — AB (ref 98–111)
CREATININE: 2.1 mg/dL — AB (ref 0.61–1.24)
CREATININE: 2.8 mg/dL — AB (ref 0.61–1.24)
CREATININE: 3.1 mg/dL — AB (ref 0.61–1.24)
CREATININE: 3.5 mg/dL — AB (ref 0.61–1.24)
Calcium, Ion: 0.6 mmol/L — CL (ref 1.15–1.40)
Calcium, Ion: 0.68 mmol/L — CL (ref 1.15–1.40)
Calcium, Ion: 0.7 mmol/L — CL (ref 1.15–1.40)
Calcium, Ion: 0.71 mmol/L — CL (ref 1.15–1.40)
Calcium, Ion: 1.01 mmol/L — ABNORMAL LOW (ref 1.15–1.40)
Calcium, Ion: 1.06 mmol/L — ABNORMAL LOW (ref 1.15–1.40)
Calcium, Ion: 1.07 mmol/L — ABNORMAL LOW (ref 1.15–1.40)
Calcium, Ion: 1.09 mmol/L — ABNORMAL LOW (ref 1.15–1.40)
Calcium, Ion: 1.09 mmol/L — ABNORMAL LOW (ref 1.15–1.40)
Chloride: 87 mmol/L — ABNORMAL LOW (ref 98–111)
Chloride: 90 mmol/L — ABNORMAL LOW (ref 98–111)
Chloride: 90 mmol/L — ABNORMAL LOW (ref 98–111)
Chloride: 92 mmol/L — ABNORMAL LOW (ref 98–111)
Chloride: 92 mmol/L — ABNORMAL LOW (ref 98–111)
Chloride: 92 mmol/L — ABNORMAL LOW (ref 98–111)
Chloride: 93 mmol/L — ABNORMAL LOW (ref 98–111)
Chloride: 93 mmol/L — ABNORMAL LOW (ref 98–111)
Chloride: 93 mmol/L — ABNORMAL LOW (ref 98–111)
Chloride: 96 mmol/L — ABNORMAL LOW (ref 98–111)
Creatinine, Ser: 2.4 mg/dL — ABNORMAL HIGH (ref 0.61–1.24)
Creatinine, Ser: 2.6 mg/dL — ABNORMAL HIGH (ref 0.61–1.24)
Creatinine, Ser: 2.6 mg/dL — ABNORMAL HIGH (ref 0.61–1.24)
Creatinine, Ser: 2.7 mg/dL — ABNORMAL HIGH (ref 0.61–1.24)
Creatinine, Ser: 2.7 mg/dL — ABNORMAL HIGH (ref 0.61–1.24)
Creatinine, Ser: 2.8 mg/dL — ABNORMAL HIGH (ref 0.61–1.24)
Creatinine, Ser: 2.9 mg/dL — ABNORMAL HIGH (ref 0.61–1.24)
Creatinine, Ser: 3.4 mg/dL — ABNORMAL HIGH (ref 0.61–1.24)
GLUCOSE: 230 mg/dL — AB (ref 70–99)
GLUCOSE: 190 mg/dL — AB (ref 70–99)
GLUCOSE: 155 mg/dL — AB (ref 70–99)
Glucose, Bld: 174 mg/dL — ABNORMAL HIGH (ref 70–99)
Glucose, Bld: 183 mg/dL — ABNORMAL HIGH (ref 70–99)
Glucose, Bld: 183 mg/dL — ABNORMAL HIGH (ref 70–99)
Glucose, Bld: 191 mg/dL — ABNORMAL HIGH (ref 70–99)
Glucose, Bld: 198 mg/dL — ABNORMAL HIGH (ref 70–99)
Glucose, Bld: 203 mg/dL — ABNORMAL HIGH (ref 70–99)
Glucose, Bld: 244 mg/dL — ABNORMAL HIGH (ref 70–99)
Glucose, Bld: 256 mg/dL — ABNORMAL HIGH (ref 70–99)
Glucose, Bld: 275 mg/dL — ABNORMAL HIGH (ref 70–99)
HCT: 24 % — ABNORMAL LOW (ref 39.0–52.0)
HCT: 25 % — ABNORMAL LOW (ref 39.0–52.0)
HCT: 26 % — ABNORMAL LOW (ref 39.0–52.0)
HCT: 22 % — ABNORMAL LOW (ref 39.0–52.0)
HCT: 23 % — ABNORMAL LOW (ref 39.0–52.0)
HCT: 23 % — ABNORMAL LOW (ref 39.0–52.0)
HCT: 26 % — ABNORMAL LOW (ref 39.0–52.0)
HCT: 27 % — ABNORMAL LOW (ref 39.0–52.0)
HEMATOCRIT: 23 % — AB (ref 39.0–52.0)
HEMATOCRIT: 24 % — AB (ref 39.0–52.0)
HEMATOCRIT: 27 % — AB (ref 39.0–52.0)
HEMATOCRIT: 28 % — AB (ref 39.0–52.0)
HEMOGLOBIN: 7.8 g/dL — AB (ref 13.0–17.0)
HEMOGLOBIN: 7.8 g/dL — AB (ref 13.0–17.0)
HEMOGLOBIN: 8.2 g/dL — AB (ref 13.0–17.0)
HEMOGLOBIN: 8.2 g/dL — AB (ref 13.0–17.0)
HEMOGLOBIN: 8.8 g/dL — AB (ref 13.0–17.0)
HEMOGLOBIN: 9.2 g/dL — AB (ref 13.0–17.0)
HEMOGLOBIN: 9.5 g/dL — AB (ref 13.0–17.0)
Hemoglobin: 7.5 g/dL — ABNORMAL LOW (ref 13.0–17.0)
Hemoglobin: 7.8 g/dL — ABNORMAL LOW (ref 13.0–17.0)
Hemoglobin: 8.5 g/dL — ABNORMAL LOW (ref 13.0–17.0)
Hemoglobin: 8.8 g/dL — ABNORMAL LOW (ref 13.0–17.0)
Hemoglobin: 9.2 g/dL — ABNORMAL LOW (ref 13.0–17.0)
POTASSIUM: 4.4 mmol/L (ref 3.5–5.1)
POTASSIUM: 4.6 mmol/L (ref 3.5–5.1)
POTASSIUM: 4.8 mmol/L (ref 3.5–5.1)
POTASSIUM: 4.2 mmol/L (ref 3.5–5.1)
POTASSIUM: 4.5 mmol/L (ref 3.5–5.1)
Potassium: 4.2 mmol/L (ref 3.5–5.1)
Potassium: 4.3 mmol/L (ref 3.5–5.1)
Potassium: 4.4 mmol/L (ref 3.5–5.1)
Potassium: 4.6 mmol/L (ref 3.5–5.1)
Potassium: 4.7 mmol/L (ref 3.5–5.1)
Potassium: 4.8 mmol/L (ref 3.5–5.1)
Potassium: 4.9 mmol/L (ref 3.5–5.1)
SODIUM: 129 mmol/L — AB (ref 135–145)
SODIUM: 131 mmol/L — AB (ref 135–145)
SODIUM: 132 mmol/L — AB (ref 135–145)
Sodium: 131 mmol/L — ABNORMAL LOW (ref 135–145)
Sodium: 129 mmol/L — ABNORMAL LOW (ref 135–145)
Sodium: 128 mmol/L — ABNORMAL LOW (ref 135–145)
Sodium: 129 mmol/L — ABNORMAL LOW (ref 135–145)
Sodium: 129 mmol/L — ABNORMAL LOW (ref 135–145)
Sodium: 131 mmol/L — ABNORMAL LOW (ref 135–145)
Sodium: 129 mmol/L — ABNORMAL LOW (ref 135–145)
Sodium: 131 mmol/L — ABNORMAL LOW (ref 135–145)
Sodium: 131 mmol/L — ABNORMAL LOW (ref 135–145)
TCO2: 28 mmol/L (ref 22–32)
TCO2: 29 mmol/L (ref 22–32)
TCO2: 30 mmol/L (ref 22–32)
TCO2: 30 mmol/L (ref 22–32)
TCO2: 31 mmol/L (ref 22–32)
TCO2: 32 mmol/L (ref 22–32)
TCO2: 32 mmol/L (ref 22–32)
TCO2: 32 mmol/L (ref 22–32)
TCO2: 33 mmol/L — AB (ref 22–32)
TCO2: 33 mmol/L — ABNORMAL HIGH (ref 22–32)
TCO2: 34 mmol/L — AB (ref 22–32)
TCO2: 35 mmol/L — ABNORMAL HIGH (ref 22–32)

## 2018-06-20 LAB — CBC
HCT: 23.1 % — ABNORMAL LOW (ref 39.0–52.0)
HEMOGLOBIN: 7.9 g/dL — AB (ref 13.0–17.0)
MCH: 30.5 pg (ref 26.0–34.0)
MCHC: 34.2 g/dL (ref 30.0–36.0)
MCV: 89.2 fL (ref 80.0–100.0)
Platelets: 557 10*3/uL — ABNORMAL HIGH (ref 150–400)
RBC: 2.59 MIL/uL — ABNORMAL LOW (ref 4.22–5.81)
RDW: 19.7 % — ABNORMAL HIGH (ref 11.5–15.5)
WBC: 18.3 10*3/uL — ABNORMAL HIGH (ref 4.0–10.5)
nRBC: 1.4 % — ABNORMAL HIGH (ref 0.0–0.2)

## 2018-06-20 LAB — RENAL FUNCTION PANEL
ANION GAP: 17 — AB (ref 5–15)
ANION GAP: 17 — AB (ref 5–15)
Albumin: 1.6 g/dL — ABNORMAL LOW (ref 3.5–5.0)
Albumin: 1.6 g/dL — ABNORMAL LOW (ref 3.5–5.0)
BUN: 47 mg/dL — ABNORMAL HIGH (ref 8–23)
BUN: 33 mg/dL — AB (ref 8–23)
CALCIUM: 9 mg/dL (ref 8.9–10.3)
CO2: 20 mmol/L — ABNORMAL LOW (ref 22–32)
CO2: 31 mmol/L (ref 22–32)
CREATININE: 3.07 mg/dL — AB (ref 0.61–1.24)
Calcium: 8.8 mg/dL — ABNORMAL LOW (ref 8.9–10.3)
Chloride: 94 mmol/L — ABNORMAL LOW (ref 98–111)
Chloride: 86 mmol/L — ABNORMAL LOW (ref 98–111)
Creatinine, Ser: 2.21 mg/dL — ABNORMAL HIGH (ref 0.61–1.24)
GFR calc Af Amer: 33 mL/min — ABNORMAL LOW (ref >60)
GFR calc non Af Amer: 28 mL/min — ABNORMAL LOW (ref >60)
GFR, EST AFRICAN AMERICAN: 22 mL/min — AB (ref >60)
GFR, EST NON AFRICAN AMERICAN: 19 mL/min — AB (ref >60)
GLUCOSE: 149 mg/dL — AB (ref 70–99)
Glucose, Bld: 193 mg/dL — ABNORMAL HIGH (ref 70–99)
PHOSPHORUS: 3.2 mg/dL (ref 2.5–4.6)
POTASSIUM: 4.5 mmol/L (ref 3.5–5.1)
POTASSIUM: 4.4 mmol/L (ref 3.5–5.1)
Phosphorus: 5 mg/dL — ABNORMAL HIGH (ref 2.5–4.6)
Sodium: 131 mmol/L — ABNORMAL LOW (ref 135–145)
Sodium: 134 mmol/L — ABNORMAL LOW (ref 135–145)

## 2018-06-20 LAB — MAGNESIUM: MAGNESIUM: 2.4 mg/dL (ref 1.7–2.4)

## 2018-06-20 LAB — HEPARIN LEVEL (UNFRACTIONATED): Heparin Unfractionated: 0.53 [IU]/mL (ref 0.30–0.70)

## 2018-06-20 LAB — VANCOMYCIN, TROUGH: Vancomycin Tr: 26 ug/mL (ref 15–20)

## 2018-06-20 MED ORDER — PRISMASOL BGK 4/2.5 32-4-2.5 MEQ/L REPLACEMENT SOLN
Status: DC
  Administered 2018-06-20 – 2018-06-22 (×3): via INTRAVENOUS_CENTRAL
  Filled 2018-06-20 (×5): qty 5000

## 2018-06-20 MED ORDER — QUETIAPINE FUMARATE 100 MG PO TABS
200.0000 mg | ORAL_TABLET | Freq: Two times a day (BID) | ORAL | Status: DC
  Administered 2018-06-20 – 2018-06-26 (×13): 200 mg via ORAL
  Filled 2018-06-20 (×13): qty 2

## 2018-06-20 MED ORDER — VANCOMYCIN HCL 500 MG IV SOLR
500.0000 mg | INTRAVENOUS | Status: AC
  Administered 2018-06-21 – 2018-06-22 (×2): 500 mg via INTRAVENOUS
  Filled 2018-06-20 (×2): qty 500

## 2018-06-20 MED ORDER — SODIUM CHLORIDE 0.9 % IV SOLN
0.0000 mg/h | INTRAVENOUS | Status: DC
  Administered 2018-06-20: 2 mg/h via INTRAVENOUS
  Administered 2018-06-20 – 2018-06-21 (×2): 5 mg/h via INTRAVENOUS
  Filled 2018-06-20 (×3): qty 10

## 2018-06-20 MED ORDER — CLONAZEPAM 0.5 MG PO TBDP
0.5000 mg | ORAL_TABLET | Freq: Two times a day (BID) | ORAL | Status: DC
  Administered 2018-06-20 (×2): 0.5 mg
  Filled 2018-06-20 (×3): qty 1

## 2018-06-20 MED ORDER — CLONAZEPAM 0.1 MG/ML ORAL SUSPENSION: 0.5000 mg | Freq: Two times a day (BID) | ORAL | Status: DC

## 2018-06-20 MED ORDER — VITAL AF 1.2 CAL PO LIQD
1000.0000 mL | ORAL | Status: DC
  Administered 2018-06-20 – 2018-06-28 (×7): 1000 mL

## 2018-06-20 MED ORDER — SODIUM CHLORIDE 0.9 % IV BOLUS
500.0000 mL | Freq: Once | INTRAVENOUS | Status: AC
  Administered 2018-06-20: 500 mL via INTRAVENOUS

## 2018-06-20 MED ORDER — PRISMASOL BGK 4/2.5 32-4-2.5 MEQ/L IV SOLN
INTRAVENOUS | Status: DC
  Administered 2018-06-20 – 2018-06-21 (×2): via INTRAVENOUS_CENTRAL
  Filled 2018-06-20 (×4): qty 5000

## 2018-06-20 MED ORDER — MIDAZOLAM HCL 2 MG/2ML IJ SOLN
1.0000 mg | Freq: Once | INTRAMUSCULAR | Status: AC
  Administered 2018-06-20: 2 mg via INTRAVENOUS

## 2018-06-20 NOTE — Progress Notes (Addendum)
ANTICOAGULATION CONSULT NOTE - Follow Up Consult  Pharmacy Consult for heparin Indication: VTE  Labs: Recent Labs    06/18/18 0715  06/19/18 0100 06/19/18 0419 06/19/18 1003  06/19/18 2211  06/20/18 0426  06/20/18 1204 06/20/18 1549 06/20/18 1558 06/20/18 1559 06/20/18 2227  HGB  --   --   --  8.1*  --    < > 8.0*   < > 7.9*   < > 7.8*  --  8.2* 9.2*  --   HCT  --   --   --  25.7*  --    < > 23.2*   < > 23.1*   < > 23.0*  --  24.0* 27.0*  --   PLT  --   --   --  590*  --   --  512*  --  557*  --   --   --   --   --   --   APTT  --   --   --   --  75*  --   --   --   --   --   --   --   --   --   --   LABPROT 14.0  --   --   --  14.4  --   --   --   --   --   --   --   --   --   --   INR 1.09  --   --   --  1.13  --   --   --   --   --   --   --   --   --   --   HEPARINUNFRC  --   --  0.37 0.51  --   --   --   --   --   --   --   --   --   --  0.53  CREATININE  --    < >  --  3.63*  --    < >  --    < > 3.07*   < > 2.80* 2.21* 2.60* 2.10*  --   CKTOTAL  --   --   --  2,847*  --   --   --   --   --   --   --   --   --   --   --    < > = values in this interval not displayed.    Assessment: 72yo male slightly supratherapeutic on heparin after resumed given low goal; per RN bleeding has basically resolved and has not worsened after resuming heparin.  Goal of Therapy:  Heparin level 0.3-0.5 units/ml   Plan:  Will decrease heparin gtt slightly to 1800 units/hr and check level with am labs.    Vernard Gambles, PharmD, BCPS  06/20/2018,11:10 PM   Addendum AM level higher despite decreased rate; RN reports no overt signs of worsening bleeding though Hgb is down and now getting transfusion. Will decrease heparin gtt by 15% to 1500 units/hr and check level in 8 hours.   VB 06/21/2018 5:25 AM

## 2018-06-20 NOTE — Procedures (Signed)
Hemodialysis Insertion Procedure Note ESKER DEVER [id number] 1945/09/09  Procedure: Insertion of Hemodialysis Catheter Type: 3 port  Indications: Hemodialysis   Procedure Details Consent: Risks of procedure as well as the alternatives and risks of each were explained to the (patient/caregiver).  Consent for procedure obtained. Time Out: Verified patient identification, verified procedure, site/side was marked, verified correct patient position, special equipment/implants available, medications/allergies/relevent history reviewed, required imaging and test results available.  Performed  Maximum sterile technique was used including antiseptics, cap, gloves, gown, hand hygiene, mask and sheet. Skin prep: Chlorhexidine; local anesthetic administered A antimicrobial bonded/coated triple lumen catheter was placed in the left internal jugular vein using the Seldinger technique. Ultrasound guidance used.Yes.   Catheter placed to 17 cm. Blood aspirated via all 3 ports and then flushed x 3. Line sutured x 2 and dressing applied.  Evaluation Blood flow good Complications: No apparent complications Patient did tolerate procedure well. Chest X-ray ordered to verify placement.  CXR: pending.  Joneen Roach, AGACNP-BC [facility] Pulmonary/Critical Care Pager [phone] or [phone]  06/20/2018 11:39 AM

## 2018-06-20 NOTE — Progress Notes (Signed)
NAME:  Bryan Parker, MRN:  [id number], DOB:  October 04, 1945, LOS: 15 ADMISSION DATE:  05/18/2018, CONSULTATION DATE:  05/28/2018 REFERRING MD:  Dr. Rhunette Croft , CHIEF COMPLAINT:  Cardiac Arrest    Brief History   72 year old male presents to ED s/p Cardiac Arrest. Per Report patient called EMS for Abdominal Pain/Nausea/Vomiting. When EMS arrived patient was alert however went pulseless and PEA while on scene. ROSC achieved after 2 EPI and 10 minutes CPR. On arrival patient continued to be unstable and required multiple rounds of CPR. Fast exam negative. Bedside ECHO revealed slightly dilated RV. ABG 6.914/69.5/368. Progressive hypotension requiring Levophed/EPI/Vasopresin and 9 Bicarb pushes.    Wife reports that patient was complaining of severe left calf pain all day 10/19. Has a history of PE 2016. Was taken off anticoagulation 2018. Due to high suspicion of PE TNKase was given in ED. PCCM asked to admit.   Past Medical History  H/O DVT/PE, CKD  Significant Hospital Events   10/20 > Presents to ED  1021 with a partial code no CPR 10/22 bleeding from IV sites and foley, heparin d/c'd for 2 hours then resumed. Hgb 6.8 > 1u PRBC transfused 10/23 CRRT started.  Giapreza added. 10/24 coude catheter placed by urology and CBI started. 10/25 - Pressor requirements much improved after giaprezza added 10/23; however, remains on vaso0.03, NE 23, epi 10, dobut 20, giapreza 25. CK up from 14.5 on 10/24 up to 35K on 10/25.  CRRT running.  10/26 - CK 31,000 (was 34,000 yesterday) . Volume removal continues. Fluid challenge strategy cancelled. CK better. Legs thinner with volume removal. Pressors: off giaprezza, levophed 6, dobutamine 10, epi 16 and vaso 0.03. Had vomit yesterday and TF on hold. Has bloody urine - seen by urology  10/27  - pressor needs coming down - off epi gtt. Dobutamine 10, Vaso 0.03 and levophed 4-11mcg/min. . Still largely volume . Volume removal via CRRT. CK down to 24K. TF still on hold.   10/28 brady , hypotensive with precedex 11/2>> Trach 11/3 bleeding around trach site - packed , heparin held   Consults: date of consult/date signed off & final recs:  PCCM 10/20 Heart failure 10/21 >  Urology 10/24 >  renal  Procedures (surgical and bedside):  ETT 10/20 >>11/2 Trach (JY) 11/2>>> Right Femoral CVC 10/20 >> 10/29 Left Femoral Aline 10/20 >> 10/30 L IJ HD cath 10/23 >  Rt radial a line 10/30 >>  Significant Diagnostic Tests:  CT Head 10/20 >> neg Echo 10/20 > EF 55 - 60%, severely dilated RV, mod dilated RA, mod TR. CT chest/abd/ pelvis 10/30  >> no bleed CT head 10/30 neg  Micro Data:  Blood 10/20 >> neg Sputum 10/20 >> neg U/A 10/20 >> neg Blood 10/29 > ng resp 10/29  >>ng Sputum 10/29>> GS few gram +>>  Few candida albicans Blood 10/29>>   Antimicrobials:  zosyn 10/20 >> 11/1 vanc 10/20 >>off, 10/29 >>  SUBJECTIVE/OVERNIGHT/INTERVAL HX   Remains critically ill, on CRRT. Bleeding from around tracheostomy has resolved after heparin was held Needing higher amounts of levo fed now up to 40 mics  Objective   Blood pressure (!) 131/96, pulse 84, temperature 97.6 F (36.4 C), temperature source Oral, resp. rate 20, height 5\' 10"  (1.778 m), weight 63.6 kg, SpO2 96 %.    Vent Mode: PSV;CPAP FiO2 (%):  [40 %-70 %] 40 % Set Rate:  [14 bmp] 14 bmp Vt Set:  [500 mL] 500 mL PEEP:  [  5 cmH20] 5 cmH20 Pressure Support:  [16 cmH20] 16 cmH20 Plateau Pressure:  [9 cmH20-18 cmH20] 12 cmH20   Intake/Output Summary (Last 24 hours) at 06/20/2018 0947 Last data filed at 06/20/2018 0900 Gross per 24 hour  Intake 4555.32 ml  Output 3055 ml  Net 1500.32 ml   Filed Weights   06/18/18 0500 06/19/18 0600 06/20/18 0500  Weight: 62.7 kg 64.9 kg 63.6 kg    Remains chronic critically ill. Sedated on propofol and fentanyl, RA SS -1, eyes slight open but does not follow commands Dusky toes cool extremities, skin peeling from shins and feet Decreased breath sounds  bilateral, pulling good tidal volumes on pressure support 10/5 S1-S2 tacky Soft nontender abdomen 1+ edema  Assessment & Plan:   Acute resp failure - s/p intubation in ED.  PLAN -Continue spontaneous breathing trials with goal of liberating him from the vent  Trach bleed: Resolved with removal of sutures and placing thrombin pad  Acute Encephalopathy - metabolic + from PEA arrest. -DC propofol and avoid Precedex due to hypotension and poor RV - Fentanyl -Increase Seroquel to 200 and add low-dose clonazepam  PEA Cardiac Arrest - concern due to massive PE.   Shock - likely mixed, obstructive due to above and likely septic/HCAP vs aspiration.given PCT > 150. - Resolved New shock 10/29 likely due to sepsis - resolved plan - Cardiology following -Back on pressors at high dose likely due to high-dose propofol and poorly functioning RV, normal saline bolus 500 cc -Avoid propofol and Precedex  Presumed PE - s/p TPA in ED. LLE DVT. Hx PE / DVT 2016 (no longer on anticoagulation). Plan: Restart heparin after dialysis catheter changed  New fever 10/29, WBC rising again - All femoral lines dc'd & defervesced,CT non diagnostic Plan: - Complete course of zosyn , ct vanc x 7 days per pharmacy -Change dialysis catheter - Trend fever and WBC  AKI on Chronic Kidney Failure - likely exacerbated by arrest and rhabdo.  Now on CRRT. Rhabdomyolysis - CK peaked at 35K on 10/25  Hyponatremia - Nephrology following - CRRT  per renal -  Acute blood loss anemia - had bleeding from IV sites and foley - about 5-6 U PRBC so far Foley cath has been removed HGB stable at present PLAN - Maintain Hgb > 8 following PEA arrest.   Urethral bleeding - likely traumatic following tPA administration. - Urology seen, doubt need for cystoscopy. - Foley discontinued   Hyperglycemia. - Continue SSI.   Protein calorie malnutrition PLAN  -TF @ goal  Resolved problems  :  Thrombocytopenia.   Disposition / Summary of Today's Plan 06/20/18   Replace dialysis catheter and then resume heparin. Worsening shock likely due to poorly functioning RV and high-dose propofol.  Was then sepsis Plan for LTAC once acute issues resolved   Code Status: 06/07/2018 partial code no CPR Family Communication: Wife updated daily  ATTESTATION & SIGNATURE   The patient is critically ill with multiple organ systems failure and requires high complexity decision making for assessment and support, frequent evaluation and titration of therapies, application of advanced monitoring technologies and extensive interpretation of multiple databases. Critical Care Time devoted to patient care services described in this note independent of APP/resident  time is 35 minutes.    Cyril Mourning MD. Tonny Bollman. [place] Pulmonary & Critical care Pager [phone] If no response call [phone]   06/20/2018

## 2018-06-20 NOTE — Care Management Note (Addendum)
Case Management Note  Patient Details  Name: Bryan Parker MRN: [id number] Date of Birth: [birth year]  Subjective/Objective: 72yo male presented with s/p Cardiac Arrest.                   Action/Plan: CM consult for [facility] referral acknowledged. CM spoke briefly with patient's spouse, Bryan Parker, via phone to discuss LTACH options. Spouse stated she's currently leaving an MD appointment, but we be back at patient's bedside around 1400 today. CM will meet with spouse today at 1400 to discuss LTACH options as requested.   Expected Discharge Date:                  Expected Discharge Plan:     In-House Referral:  Clinical Social Work  Discharge planning Services  CM Consult  Post Acute Care Choice:  Long Term Acute Care (LTAC) Choice offered to:  Spouse  DME Arranged:  N/A DME Agency:  NA  HH Arranged:  NA HH Agency:  NA  Status of Service:  In process, will continue to follow  If discussed at Long Length of Stay Meetings, dates discussed:    Additional Comments: 06/20/18 @ 1435-Latriece Anstine RNCM-CM met with patient's spouse to discuss  LTACH options, with [facility] selected. CM spoke to [place], [place] liaison to discuss the referral; per Corrina, no LTACH beds could be offered because BCBS Medicare would not approve an LTACH facility at either [facility] nor [facility]. Patient's other option would be a SNF for vent/trach patients, with Shelton Silvas 2H CSW updated. CM informed patient's spouse via phone of the conversation with [facility], and [employer] Medicare not approving an LTACH for continual care, with spouse verbalizing understanding. Informed spouse the CSW will contact her to discuss SNF options. CM will continue to follow.   Midge Minium RN, BSN, NCM-BC, ACM-RN [phone] 06/20/2018, 11:41 AM

## 2018-06-20 NOTE — Progress Notes (Signed)
Pharmacy Antibiotic Note  Bryan Parker is a 72 y.o. male admitted on 07/02/2018 with possible aspiration pna/sepsis. Pharmacy has been reconsulted to start Zosyn. Off CRRT since yesterday. Tmax 100.5, WBC up to 21.9, PCT 150 > 75.62. Resent TA cultures today.   CRRT started @ 18:00 yesterday. Vanc trough drawn appropriately came back at 26 mg/dl which is supratherapeutic. Vanc dose today already administered before trough came back.  Plan: Will decrease Vancomycin to 500 mg IV q24h and delay dose to 2200 tomorrow night   Zosyn 3.375 g IV q6h (CRRT dosing)  Will continue to monitor   Height: 5\' 10"  (177.8 cm) Weight: 140 lb 3.4 oz (63.6 kg) IBW/kg (Calculated) : 73  Temp (24hrs), Avg:97.6 F (36.4 C), Min:97.6 F (36.4 C), Max:97.7 F (36.5 C)  Recent Labs  Lab 06/17/18 0353  06/18/18 0338  06/19/18 0419  06/19/18 2211  06/20/18 0426  06/20/18 1006 06/20/18 1007 06/20/18 1203 06/20/18 1204 06/20/18 1549  WBC 21.7*  --  22.7*  --  21.9*  --  18.9*  --  18.3*  --   --   --   --   --   --   CREATININE 1.74*   < > 1.48*   < > 3.63*   < >  --    < > 3.07*   < > 2.70* 2.90* 2.40* 2.80* 2.21*  VANCOTROUGH  --   --   --   --   --   --   --   --   --   --   --   --   --   --  26*   < > = values in this interval not displayed.    Estimated Creatinine Clearance: 27.6 mL/min (A) (by C-G formula based on SCr of 2.21 mg/dL (H)).    No Known Allergies  Dose adjustments this admission: 10/23 VR = 22 - vanc 750 mg q24h, pt started on CRRT (zosyn adjusted for CRRT) 10/25 VR = 36 - drawn after vancomycin was given   Antimicrobials:  Zosyn 10/20>>  Vancomycin 10/20 >> 10/25  Cultures: 11/3: TA pending 10/29 BCx: Negative 10/29 TA: few candida, rare GPC  10/20 BCx: Negative 10/20 TA: normal flora 10/20 MRSA PCR: negative   Thank you for involving pharmacy in this patient's care.  Jeanella Cara, PharmD, [facility] Clinical Pharmacist Please see AMION for all Pharmacists' Contact  Phone Numbers 06/20/2018, 6:02 PM

## 2018-06-20 NOTE — Progress Notes (Addendum)
Patient ID: Bryan Parker, male   DOB: 28-Sep-1945, 72 y.o.   MRN: [id number]     Advanced Heart Failure Rounding Note  PCP-Cardiologist: Fransico Him, MD   Subjective:    Trach done 06/18/18.  Had bleeding from site overnight with fall in hgb, heparin off for now.   Remains on CVVHD. Keeping even.   WBC 18.3. Remains on Zosyn and vanc.   Blood Cx - Negative.   Hoping to wean vent aggressively today with Vent. Not weaned yesterday due to bleeding from trach. Sedated. Has not been following commands. Got head CT over weekend with concerns for lack of movement on R side, no obvious CVA.    CT head 06/18/18 with no acute abnormality.   Objective:   Weight Range: 63.6 kg Body mass index is 20.12 kg/m.   Vital Signs:   Temp:  [97.6 F (36.4 C)-97.9 F (36.6 C)] 97.6 F (36.4 C) (11/04 0400) Pulse Rate:  [69-101] 86 (11/04 0700) Resp:  [11-30] 17 (11/04 0700) BP: (83-141)/(45-82) 103/64 (11/04 0700) SpO2:  [100 %] 100 % (11/04 0700) Arterial Line BP: (88-160)/(33-76) 106/48 (11/04 0700) FiO2 (%):  [40 %-70 %] 40 % (11/04 0400) Weight:  [63.6 kg] 63.6 kg (11/04 0500) Last BM Date: 06/20/18(stool in rectal tube)  Weight change: Filed Weights   06/18/18 0500 06/19/18 0600 06/20/18 0500  Weight: 62.7 kg 64.9 kg 63.6 kg    Intake/Output:   Intake/Output Summary (Last 24 hours) at 06/20/2018 0716 Last data filed at 06/20/2018 0700 Gross per 24 hour  Intake 4508.95 ml  Output 3055 ml  Net 1453.95 ml      Physical Exam   General: Intubated/sedated.  HEENT: + ETT Neck: Supple. JVP ~8. Carotids 2+ bilat; no bruits. No thyromegaly or nodule noted. Cor: PMI nondisplaced. RRR, No M/G/R noted Lungs: Diminished basilar sounds Abdomen: Soft, non-tender, non-distended, no HSM. No bruits or masses. +BS  Extremities: No cyanosis, clubbing, or rash. Trace ankle edema.  Neuro: Intubated/sedated   Telemetry   NSR 80-90s, personally reviewed.   EKG    No new tracings.     Labs    CBC Recent Labs    06/19/18 2211  06/20/18 0233 06/20/18 0426  WBC 18.9*  --   --  18.3*  HGB 8.0*   < > 8.2* 7.9*  HCT 23.2*   < > 24.0* 23.1*  MCV 88.5  --   --  89.2  PLT 512*  --   --  557*   < > = values in this interval not displayed.   Basic Metabolic Panel Recent Labs    06/19/18 0419 06/19/18 1636  06/20/18 0223 06/20/18 0233 06/20/18 0426  NA 130* 131*   < > 131* 129*  --   K 4.3 5.4*   < > 4.3 4.4  --   CL 98 96*   < > 92* 93*  --   CO2 22 22  --   --   --   --   GLUCOSE 200* 136*   < > 256* 230*  --   BUN 56* 65*   < > 38* 47*  --   CREATININE 3.63* 4.17*   < > 2.70* 3.40*  --   CALCIUM 8.1* 8.3*  --   --   --   --   MG 3.0*  --   --   --   --  2.4  PHOS 4.3 5.8*  --   --   --   --    < > =  values in this interval not displayed.   Liver Function Tests Recent Labs    06/19/18 0419 06/19/18 1636  ALBUMIN 1.5* 1.5*   No results for input(s): LIPASE, AMYLASE in the last 72 hours. Cardiac Enzymes Recent Labs    06/19/18 0419  CKTOTAL 2,847*    BNP: BNP (last 3 results) Recent Labs    05/19/2018 0854  BNP 18.0    ProBNP (last 3 results) No results for input(s): PROBNP in the last 8760 hours.   D-Dimer No results for input(s): DDIMER in the last 72 hours. Hemoglobin A1C No results for input(s): HGBA1C in the last 72 hours. Fasting Lipid Panel Recent Labs    06/17/18 1206  TRIG 372*   Thyroid Function Tests No results for input(s): TSH, T4TOTAL, T3FREE, THYROIDAB in the last 72 hours.  Invalid input(s): FREET3  Other results:   Imaging    No results found.   Medications:     Scheduled Medications: . chlorhexidine gluconate (MEDLINE KIT)  15 mL Mouth Rinse BID  . Chlorhexidine Gluconate Cloth  6 each Topical Q0600  . darbepoetin (ARANESP) injection - NON-DIALYSIS  200 mcg Subcutaneous Q Sun-1800  . hydrALAZINE  25 mg Oral Q8H  . insulin aspart  0-9 Units Subcutaneous Q4H  . insulin aspart  2 Units Subcutaneous  Q4H  . mouth rinse  15 mL Mouth Rinse 10 times per day  . metoCLOPramide (REGLAN) injection  5 mg Intravenous Q6H  . pantoprazole sodium  40 mg Per Tube Q1200  . QUEtiapine  150 mg Oral BID  . sodium chloride flush  10-40 mL Intracatheter Q12H  . THROMBI-PAD  1 each Topical Once    Infusions: .  prismasol BGK 4/2.5 Stopped (06/20/18 0330)  . sodium chloride 1,000 mL (06/19/18 1701)  . sodium chloride Stopped (06/17/18 1635)  . sodium chloride    . calcium gluconate infusion for CRRT 60 mL/hr at 06/20/18 0618  . feeding supplement (VITAL AF 1.2 CAL) 1,000 mL (06/19/18 1856)  . fentaNYL infusion INTRAVENOUS 400 mcg/hr (06/20/18 0700)  . heparin Stopped (06/19/18 0915)  . norepinephrine (LEVOPHED) Adult infusion 32 mcg/min (06/20/18 0700)  . piperacillin-tazobactam Stopped (06/20/18 7628)  . prismasol BGK 4/2.5 1,500 mL/hr at 06/20/18 3151  . propofol (DIPRIVAN) infusion 80 mcg/kg/min (06/20/18 0700)  . sodium chloride    . sodium chloride    . sodium citrate 2 %/dextrose 2.5% solution 3000 mL 250 mL/hr at 06/20/18 0629  . vancomycin Stopped (06/19/18 1803)  . vasopressin (PITRESSIN) infusion - *FOR SHOCK* 0.03 Units/min (06/20/18 0700)    PRN Medications: sodium chloride, Place/Maintain arterial line **AND** sodium chloride, acetaminophen **OR** acetaminophen (TYLENOL) oral liquid 160 mg/5 mL, artificial tears, fentaNYL (SUBLIMAZE) injection, Gerhardt's butt cream, heparin, hydrALAZINE, Influenza vac split quadrivalent PF, midazolam, sodium chloride, sodium chloride flush, white petrolatum    Patient Bryan Parker is a 72 y.o. 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  Presented to [facility] after PEA arrest with 10+ minutes down, presumed large PE. Now with RV failure.  Assessment/Plan   1. Shock: - Mixed cardiogenic/septic. 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 Also septic shock with  very high PCT, fever, and presumed PNA.  Echo 10/2014 showed EF 60%, RV normal, PA peak pressure 73 mmHg.  Echo 06/04/2018 showed LV EF 55-60%, RV severely dilated and dysfunctional, moderate TR, moderately dilated RA, PA peak pressure 31 mmHg.   -  Did not place RV impella with extensive bleeding post-TNKase and extensive clot.  - Remains on CVVHD. Keeping even. Weight down 3 lbs from Friday.  - Trach placed 06/18/18. - Pressors off last week but restarted over weekend after fever on Friday, suspect primarily vasodilatory shock => remains on norepinephrine/vasopressin.  2. PEA arrest:  - Suspect secondary to massive PE.  Received 10+ minutes CPR. Head CT negative.  CT of head was negative.  - Electrolytes stable on CVVHD.  3. Presumed massive PE:  - Has hx of PE in 2016, but has not been on chronic anticoagulation (off >1 year).  Lower extremity dopplers this admission positive for left popliteal acute DVT.  Received TNKase in ED.  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  - Heparin on hold currently with bleeding from trach site and fall in hgb, will restart when stabilizes.  4. ID:  - WBC 18.3 today. Foley removed 10/31. Last procalcitonin was 17.2. - Concerned for HCAP, possibly in setting of aspiration (lower lobe infiltrates on CXR).  Cultures remain negative.   - Remains on vanc, Zosyn has been restarted.  5. AKI on CKD:  - Baseline creatinine looks to be 1.6-2.2 range (cause of baseline CKD uncertain).  - Remains on CVVHD, keeping even. Will likely be HD dependent.  - AKI due to shock with likely contribution from rhabdomyolysis.  6. Anemia: Profuse bleeding from OGT and urethra post-TNKase.  He has received multiple units PRBCs.  Now with profuse bleeding from trach site.  - Hgb down to 7.9 today from 8.2.  - Urology following. Supportive care for now, but recommended further imaging if possible and ?TURP in future. Continues  to have hematuria with foley irrigation.   7. Elevated liver enzymes:  - In setting of shock.  8. Hypocalcemia:  - Per nephrology. 9. Acute respiratory hypoxemic respiratory failure:  - Trach placed 06/18/18. Weaning per CCM. 10. Thrombocytopenia:   - Suspect fall in plts in setting of sepsis, critical illness/inflammation.  - Resolved.  11. Rhabdomyolysis:  - CK trended down. Now 2847 06/19/18.  No role for IVF in setting of ESRD - Per primary. Continue CRRT 12. Ileus: Resolving, TFs going in.   Prognosis remains guarded. Hoping to wean vent but thus far has not been following commands.   Length of Stay: [place], [place]  06/20/2018, 7:16 AM  Advanced Heart Failure Team Pager [phone] (M-F; 7a - 4p)  Please contact [facility] for night-coverage after hours (4p -7a ) and weekends on [email or web address]  Patient seen with PA, agree with the above note.  Recurrent fever Friday and restarted on norepinephrine/vasopressin, which he continues this morning.  Suspect primarily a septic shock picture as he had been off pressors at the end of last week.  He is back on vancomycin/Zosyn, no culture data yet.   Heparin gtt temporarily stopped with trach bleeding, hgb trending down but bleeding appears to have tapered off.   Remains on CVVH per renal.   Not able to wean vent yet, now with tracheostomy.  Concern for stroke over weekend with right-sided weakness but head CT does not show obvious abnormality.   Deep tissue injury along lower legs/feet.   Overall, I think that prognosis is poor at this point for meaningful recovery.  He is a partial code.  Palliative care discussions with family may be helpful.   Loralie Champagne 06/20/2018 7:46 AM

## 2018-06-20 NOTE — Progress Notes (Signed)
Nutrition Follow Up  DOCUMENTATION CODES:   Not applicable  INTERVENTION:    Increase Vital AF 1.2 to goal rate of 60 ml/h (1440 ml per day)   Provides 1728 kcals, 108 gm protein, 1167 ml of free water daily  NUTRITION DIAGNOSIS:   Inadequate oral intake related to inability to eat as evidenced by NPO status, ongoing  GOAL:   Patient will meet greater than or equal to 90% of their needs, met  MONITOR:   Vent status, TF tolerance, Labs, Skin, Weight trends, I & O's  ASSESSMENT:   72 y.o. male with hx of HTN, CKD, lg PE 2016 and no longer anticoagulated. He was admitted 10/20 after PEA cardiac arrest. Coded repeatedly in the ER. High suspicion of PE, TNKase given in ED.    10/23 CVVHD started 10/25 vomiting, TF held  Patient is currently on ventilator support MV: 16.6 L/min Temp (24hrs), Avg:97.6 F (36.4 C), Min:97.6 F (36.4 C), Max:97.7 F (36.5 C)  Propofol off  Pt s/p bedside tracheostomy placement 11/2. Vital AF 1.2 infusing at goal rate of 55 ml/hr via Cortrak tube. Worsening cardiogenic shock likely due to poorly functioning RV.   Acute on CKD. Anuric. CVVHD restarted 11/3. Labs & medications reviewed. Na 129 (L). CBG's [id number].  Spoke with Thayer Headings, RN.  Diet Order:   Diet Order            Diet NPO time specified  Diet effective midnight             EDUCATION NEEDS:   Not appropriate for education at this time  Skin:  Skin Assessment: Skin Integrity Issues: Skin Integrity Issues:: DTI DTI: bilateral heels Other: gangrene of toes  Last BM:  11/4 - flexiseal    Intake/Output Summary (Last 24 hours) at 06/20/2018 1643 Last data filed at 06/20/2018 1600 Gross per 24 hour  Intake 4293.88 ml  Output 4802 ml  Net -508.12 ml   Height:   Ht Readings from Last 1 Encounters:  06/20/18 5' 10"  (1.778 m)   Weight:   Wt Readings from Last 1 Encounters:  06/20/18 63.6 kg  06/04/2018          62.1 kg  Ideal Body Weight:  75.45 kg  BMI:   Body mass index is 20.12 kg/m.  Estimated Nutritional Needs:  Kcal:  1739  Protein:  95-110 g  Fluid:  per MD  Bryan Parker, RD, LDN Pager #: [phone] After-Hours Pager #: [phone]

## 2018-06-20 NOTE — Progress Notes (Signed)
Vancomycin Trough 26 received from lab. Pharmacy notified.

## 2018-06-20 NOTE — Consult Note (Signed)
WOC Nurse wound consult note Patient evaluated in [facility] 2H06.  No family present. Reason for Consult: Multiple blisters on lower legs Wound type: Ruptured bullae. Wound beds: The BLE have ruptured bullae, some of which are missing the overlying skin.  The RLE is more greatly impacted by this than the LLE.  The wound beds of the RLE are darkened, the surrounding skin is darkened.  The patient has had multiple episodes of CPR and vasopressor support.  It is not at all uncommon for patients in such circumstances to have extremity tissue impairment from loss of, or limited microcirculation during such events, and with the use of vasopressors.  This unfortunate patient certainly fits into this category. Drainage (amount, consistency, odor) No drainage on existing dressings. Dressing procedure/placement/frequency: For all intact or ruptured fluid or blood blisters on the lower extremities, place Xeroform gauze [place] # 294) over the areas.  Secure with Kerlex. Change daily.  The right posterior heel DTPI measures 3.6 cm x 4.2 cm, is purple/maroon in color, and has intact overlying tissue. The left posterior heel DTPI measures 1.3 cm x 2.5 cm, is purple/marron in color, and has intact overlying tissue. Prevalon boots are in use bilaterally.  Please continue the use of these. Monitor the wound area(s) for worsening of condition such as: Signs/symptoms of infection,  Increase in size,  Development of or worsening of odor, Development of pain, or increased pain at the affected locations.  Notify the medical team if any of these develop.  Helmut Muster, RN, MSN, CWOCN, CNS-BC, pager [phone]

## 2018-06-20 NOTE — Progress Notes (Signed)
[facility] NEPHROLOGY PROGRESS NOTE  Assessment/ Plan: Pt is a 72 y.o. yo male with CKD (b/l cr 1.5-2 in 2016, on admission 2.5), history of PE presented with PEA arrest with hemodynamic instability and worsening renal failure.  #Acute kidney injury on CKD likely ATN in the setting of prolonged hypotension requiring pressors.  Do not think rhabdomyolysis causing renal failure.  He is anuric and currently on CRRT restarted on 11/3.  Bladder scan with no urine.  CT scan abdomen pelvis showed no hydronephrosis on 06/15/18.  Continue CRRT with ultrafiltration of 2.4 L in 24 hours.  Now no net ultrafiltration and all bags are  4K.  Iv Heparin for anticoagulation, currently on hold because of bleeding from the trach site.  -Monitor urine output, BMP. -Temporary catheter is  more than 10 days, I recommend to replace it. I d/w ICU team.  #Rhabdomyolysis due to trauma/code: CK level is trending down.  #Hypotension/shock: On Levophed and vasopressin, required to increase the dose overnight.  #Anemia altered factorial etiology: I will check iron stores.  Currently on aranesp.  # Massive PE and PEA arrest: History of PE, currently on anticoagulation.  #Sepsis on Vanco and Zosyn.  Discussed with the primary team.  Subjective: Seen and examined at bedside.  Patient is intubated, sedated.  Not responsive.  Review of systems limited. Objective Vital signs in last 24 hours: Vitals:   06/20/18 0700 06/20/18 0736 06/20/18 0753 06/20/18 0800  BP: 103/64 103/64  113/69  Pulse: 86 80  78  Resp: _0 Temp:   97.6 F (36.4 C)   TempSrc:   Oral   SpO2: 100% 100%  100%  Weight:      Height:       Weight change: -1.3 kg  Intake/Output Summary (Last 24 hours) at 06/20/2018 0849 Last data filed at 06/20/2018 0800 Gross per 24 hour  Intake 4551.91 ml  Output 3055 ml  Net 1496.91 ml       Labs: Basic Metabolic Panel: Recent Labs  Lab 06/19/18 0419 06/19/18 1636   06/20/18 0223 06/20/18 0233 06/20/18 0426  NA 130* 131*   < > 131* 129* 131*  K 4.3 5.4*   < > 4.3 4.4 4.5  CL 98 96*   < > 92* 93* 94*  CO2 22 22  --   --   --  20*  GLUCOSE 200* 136*   < > 256* 230* 193*  BUN 56* 65*   < > 38* 47* 47*  CREATININE 3.63* 4.17*   < > 2.70* 3.40* 3.07*  CALCIUM 8.1* 8.3*  --   --   --  8.8*  PHOS 4.3 5.8*  --   --   --  5.0*   < > = values in this interval not displayed.   Liver Function Tests: Recent Labs  Lab 06/15/18 0347  06/19/18 0419 06/19/18 1636 06/20/18 0426  AST 335*  --   --   --   --   ALT 163*  --   --   --   --   ALKPHOS 118  --   --   --   --   BILITOT 5.5*  --   --   --   --   PROT 6.1*  --   --   --   --   ALBUMIN 1.8*   < > 1.5* 1.5* 1.6*   < > = values in this interval not displayed.   No results  for input(s): LIPASE, AMYLASE in the last 168 hours. No results for input(s): AMMONIA in the last 168 hours. CBC: Recent Labs  Lab 06/17/18 0353 06/18/18 0338 06/19/18 0419  06/19/18 2211  06/20/18 0223 06/20/18 0233 06/20/18 0426  WBC 21.7* 22.7* 21.9*  --  18.9*  --   --   --  18.3*  HGB 9.4* 9.6* 8.1*   < > 8.0*   < > 9.5* 8.2* 7.9*  HCT 29.8* 29.3* 25.7*   < > 23.2*   < > 28.0* 24.0* 23.1*  MCV 88.7 87.2 88.9  --  88.5  --   --   --  89.2  PLT 496* 572* 590*  --  512*  --   --   --  557*   < > = values in this interval not displayed.   Cardiac Enzymes: Recent Labs  Lab 06/14/18 0421 06/16/18 0340 06/19/18 0419  CKTOTAL 14,236* 7,710* 2,847*   CBG: Recent Labs  Lab 06/19/18 0340 06/19/18 0735 06/19/18 1224 06/19/18 1658 06/20/18 0755  GLUCAP 162* 137* 112* 121* 107*    Iron Studies: No results for input(s): IRON, TIBC, TRANSFERRIN, FERRITIN in the last 72 hours. Studies/Results: Ct Head Wo Contrast  Result Date: 06/18/2018 CLINICAL DATA:  Focal neuro deficit.  Pupil change. EXAM: CT HEAD WITHOUT CONTRAST TECHNIQUE: Contiguous axial images were obtained from the base of the skull through the vertex  without intravenous contrast. COMPARISON:  CT head 06/15/2018 FINDINGS: Brain: Mild atrophy. Negative for acute infarct, hemorrhage, or mass. Vascular: Negative for hyperdense vessel Skull: Negative Sinuses/Orbits: Mild mucosal edema paranasal sinuses.  Normal orbit Other: None IMPRESSION: No acute abnormality.  No change from the recent study. Electronically Signed   By: Franchot Gallo M.D.   On: 06/18/2018 21:15   Dg Chest Port 1 View  Result Date: 06/19/2018 CLINICAL DATA:  Respiratory failure. EXAM: PORTABLE CHEST 1 VIEW COMPARISON:  06/18/2018 FINDINGS: There has been a placement of feeding catheter, descending below the diaphragm, tip collimated off the image. Right internal jugular approach venous catheter sheath and tracheostomy tube are unchanged. Cardiomediastinal silhouette is normal. Mediastinal contours appear intact. There is no evidence of lobar airspace consolidation, pleural effusion or pneumothorax. Minimal peribronchial airspace consolidation versus atelectasis the lung bases. Osseous structures are without acute abnormality. Soft tissues are grossly normal. IMPRESSION: Minimal peribronchial airspace consolidation versus atelectasis in the lung bases. Electronically Signed   By: Fidela Salisbury M.D.   On: 06/19/2018 08:00   Dg Chest Port 1 View  Result Date: 06/18/2018 CLINICAL DATA:  Tracheostomy tube change. EXAM: PORTABLE CHEST 1 VIEW COMPARISON:  Chest x-ray dated June 16, 2018. FINDINGS: New tracheostomy tube in good position with the tip at the thoracic inlet. Unchanged right internal jugular central venous catheter. Interval removal of the enteric tube. The heart size and mediastinal contours are within normal limits. Normal pulmonary vascularity. Improving aeration of the left lower lobe. Unchanged small left pleural effusion. No pneumothorax. No acute osseous abnormality. IMPRESSION: 1. Tracheostomy tube in good position. 2. Slightly improved aeration in the left lower  lobe. Unchanged small left pleural effusion. Electronically Signed   By: Titus Dubin M.D.   On: 06/18/2018 15:38    Medications: Infusions: .  prismasol BGK 4/2.5 Stopped (06/20/18 0330)  . sodium chloride 1,000 mL (06/19/18 1701)  . sodium chloride Stopped (06/17/18 1635)  . sodium chloride    . calcium gluconate infusion for CRRT 60 mL/hr at 06/20/18 0618  . feeding supplement (VITAL AF 1.2  CAL) 1,000 mL (06/19/18 1856)  . fentaNYL infusion INTRAVENOUS 400 mcg/hr (06/20/18 0800)  . heparin Stopped (06/19/18 0915)  . norepinephrine (LEVOPHED) Adult infusion 33 mcg/min (06/20/18 0800)  . piperacillin-tazobactam Stopped (06/20/18 0881)  . prismasol BGK 4/2.5 1,500 mL/hr at 06/20/18 1031  . propofol (DIPRIVAN) infusion 80 mcg/kg/min (06/20/18 0800)  . sodium chloride    . sodium chloride    . sodium citrate 2 %/dextrose 2.5% solution 3000 mL 250 mL/hr at 06/20/18 0629  . vancomycin Stopped (06/19/18 1803)  . vasopressin (PITRESSIN) infusion - *FOR SHOCK* 0.03 Units/min (06/20/18 0800)    Scheduled Medications: . chlorhexidine gluconate (MEDLINE KIT)  15 mL Mouth Rinse BID  . Chlorhexidine Gluconate Cloth  6 each Topical Q0600  . darbepoetin (ARANESP) injection - NON-DIALYSIS  200 mcg Subcutaneous Q Sun-1800  . hydrALAZINE  25 mg Oral Q8H  . insulin aspart  0-9 Units Subcutaneous Q4H  . insulin aspart  2 Units Subcutaneous Q4H  . mouth rinse  15 mL Mouth Rinse 10 times per day  . metoCLOPramide (REGLAN) injection  5 mg Intravenous Q6H  . pantoprazole sodium  40 mg Per Tube Q1200  . QUEtiapine  150 mg Oral BID  . sodium chloride flush  10-40 mL Intracatheter Q12H  . THROMBI-PAD  1 each Topical Once    have reviewed scheduled and prn medications.  Physical Exam: General: Sedated, intubated Heart:RRR, s1s2 nl Lungs: Coarse breath sound bilateral, no wheezing Abdomen:soft, Non-tender, non-distended Extremities:No edema Dialysis Access: Right IJ temporary catheter placed on  10/23.  Faline Langer Prasad Jayma Volpi 06/20/2018,8:49 AM  LOS: 15 days

## 2018-06-20 NOTE — Progress Notes (Signed)
RT note: Attempted daily SBT on patient however patient's RR dropped to below 6.  Placed patient back on full support ventilation.  Will continue to monitor.

## 2018-06-21 DIAGNOSIS — G9340 Encephalopathy, unspecified: Secondary | ICD-10-CM

## 2018-06-21 LAB — RENAL FUNCTION PANEL
ALBUMIN: 1.5 g/dL — AB (ref 3.5–5.0)
Albumin: 1.6 g/dL — ABNORMAL LOW (ref 3.5–5.0)
Anion gap: 12 (ref 5–15)
Anion gap: 8 (ref 5–15)
BUN: 29 mg/dL — ABNORMAL HIGH (ref 8–23)
BUN: 26 mg/dL — ABNORMAL HIGH (ref 8–23)
CALCIUM: 8.1 mg/dL — AB (ref 8.9–10.3)
CALCIUM: 8 mg/dL — AB (ref 8.9–10.3)
CO2: 27 mmol/L (ref 22–32)
CO2: 29 mmol/L (ref 22–32)
CREATININE: 2.07 mg/dL — AB (ref 0.61–1.24)
CREATININE: 1.8 mg/dL — AB (ref 0.61–1.24)
Chloride: 94 mmol/L — ABNORMAL LOW (ref 98–111)
Chloride: 97 mmol/L — ABNORMAL LOW (ref 98–111)
GFR calc non Af Amer: 36 mL/min — ABNORMAL LOW (ref >60)
GFR, EST AFRICAN AMERICAN: 35 mL/min — AB (ref >60)
GFR, EST AFRICAN AMERICAN: 42 mL/min — AB (ref >60)
GFR, EST NON AFRICAN AMERICAN: 31 mL/min — AB (ref >60)
GLUCOSE: 135 mg/dL — AB (ref 70–99)
Glucose, Bld: 132 mg/dL — ABNORMAL HIGH (ref 70–99)
PHOSPHORUS: 1.9 mg/dL — AB (ref 2.5–4.6)
Phosphorus: 3 mg/dL (ref 2.5–4.6)
Potassium: 4.6 mmol/L (ref 3.5–5.1)
Potassium: 4.7 mmol/L (ref 3.5–5.1)
SODIUM: 133 mmol/L — AB (ref 135–145)
SODIUM: 134 mmol/L — AB (ref 135–145)

## 2018-06-21 LAB — CBC
HCT: 20.4 % — ABNORMAL LOW (ref 39.0–52.0)
HEMOGLOBIN: 6.8 g/dL — AB (ref 13.0–17.0)
MCH: 29.2 pg (ref 26.0–34.0)
MCHC: 33.3 g/dL (ref 30.0–36.0)
MCV: 87.6 fL (ref 80.0–100.0)
Platelets: 523 10*3/uL — ABNORMAL HIGH (ref 150–400)
RBC: 2.33 MIL/uL — ABNORMAL LOW (ref 4.22–5.81)
RDW: 19.5 % — ABNORMAL HIGH (ref 11.5–15.5)
WBC: 21 10*3/uL — AB (ref 4.0–10.5)
nRBC: 5.3 % — ABNORMAL HIGH (ref 0.0–0.2)

## 2018-06-21 LAB — CULTURE, RESPIRATORY W GRAM STAIN: Gram Stain: NONE SEEN

## 2018-06-21 LAB — GLUCOSE, CAPILLARY
GLUCOSE-CAPILLARY: 134 mg/dL — AB (ref 70–99)
Glucose-Capillary: 115 mg/dL — ABNORMAL HIGH (ref 70–99)
Glucose-Capillary: 129 mg/dL — ABNORMAL HIGH (ref 70–99)
Glucose-Capillary: 153 mg/dL — ABNORMAL HIGH (ref 70–99)
Glucose-Capillary: 115 mg/dL — ABNORMAL HIGH (ref 70–99)
Glucose-Capillary: 124 mg/dL — ABNORMAL HIGH (ref 70–99)
Glucose-Capillary: 140 mg/dL — ABNORMAL HIGH (ref 70–99)

## 2018-06-21 LAB — IRON AND TIBC
IRON: 50 ug/dL (ref 45–182)
Saturation Ratios: 25 % (ref 17.9–39.5)
TIBC: 200 ug/dL — AB (ref 250–450)
UIBC: 150 ug/dL

## 2018-06-21 LAB — HEPARIN LEVEL (UNFRACTIONATED)
Heparin Unfractionated: 0.75 IU/mL — ABNORMAL HIGH (ref 0.30–0.70)
Heparin Unfractionated: 0.69 IU/mL (ref 0.30–0.70)
Heparin Unfractionated: 0.36 IU/mL (ref 0.30–0.70)

## 2018-06-21 LAB — PROCALCITONIN: PROCALCITONIN: 6.59 ng/mL

## 2018-06-21 LAB — MAGNESIUM: MAGNESIUM: 2.2 mg/dL (ref 1.7–2.4)

## 2018-06-21 LAB — FERRITIN: Ferritin: 1101 ng/mL — ABNORMAL HIGH (ref 24–336)

## 2018-06-21 LAB — CULTURE, RESPIRATORY

## 2018-06-21 LAB — PREPARE RBC (CROSSMATCH)

## 2018-06-21 MED ORDER — CLONAZEPAM 0.5 MG PO TBDP
1.0000 mg | ORAL_TABLET | Freq: Two times a day (BID) | ORAL | Status: DC
  Administered 2018-06-21 – 2018-06-27 (×14): 1 mg
  Filled 2018-06-21 (×14): qty 2

## 2018-06-21 MED ORDER — SODIUM CHLORIDE 0.9% IV SOLUTION
Freq: Once | INTRAVENOUS | Status: AC
  Administered 2018-06-21: 07:00:00 via INTRAVENOUS

## 2018-06-21 NOTE — Progress Notes (Signed)
ANTICOAGULATION CONSULT NOTE   Pharmacy Consult for Heparin Indication: suspected pulmonary embolus and confirmed DVT  No Known Allergies  Patient Measurements: Height: 5\' 10"  (177.8 cm) Weight: 138 lb 10.7 oz (62.9 kg) IBW/kg (Calculated) : 73  Vital Signs: Temp: 98.7 F (37.1 C) (11/05 1200) Temp Source: Oral (11/05 1200) BP: 86/46 (11/05 1400) Pulse Rate: 107 (11/05 1117)  Labs: Recent Labs    06/19/18 0419 06/19/18 1003  06/19/18 2211  06/20/18 0426  06/20/18 1558 06/20/18 1559 06/20/18 2227 06/21/18 0400 06/21/18 1248  HGB 8.1*  --    < > 8.0*   < > 7.9*   < > 8.2* 9.2*  --  6.8*  --   HCT 25.7*  --    < > 23.2*   < > 23.1*   < > 24.0* 27.0*  --  20.4*  --   PLT 590*  --   --  512*  --  557*  --   --   --   --  523*  --   APTT  --  75*  --   --   --   --   --   --   --   --   --   --   LABPROT  --  14.4  --   --   --   --   --   --   --   --   --   --   INR  --  1.13  --   --   --   --   --   --   --   --   --   --   HEPARINUNFRC 0.51  --   --   --   --   --   --   --   --  0.53 0.75* 0.69  CREATININE 3.63*  --    < >  --    < > 3.07*   < > 2.60* 2.10*  --  2.07*  --   CKTOTAL 2,847*  --   --   --   --   --   --   --   --   --   --   --    < > = values in this interval not displayed.    Estimated Creatinine Clearance: 29.1 mL/min (A) (by C-G formula based on SCr of 2.07 mg/dL (H)).  Assessment: 72yo male with DVT and likely PE s/p PEA arrest and TNKase 10/20 for heparin. Currently on CRRT. Trach site bleeding now resolved and heparin was restarted yesterday.  Afternoon heparin level trending down, but still above goal at 0.69 on heparin 1500 units/hr. No issues with infusion reported.  Goal of Therapy:  Heparin level 0.3-0.5 units/ml Monitor platelets by anticoagulation protocol: Yes   Plan:  Decrease heparin to 1250 units/hr Heparin level in ~8 hours Daily heparin level and CBC  Thanks for allowing pharmacy to be a part of this patient's  care.  Arvilla Market, PharmD PGY1 Pharmacy Resident Phone [phone] 06/21/2018     2:27 PM

## 2018-06-21 NOTE — Progress Notes (Signed)
eLink Physician-Brief Progress Note Patient Name: Bryan Parker DOB: Jun 17, 1946 MRN: [id number]   Date of Service  06/21/2018  HPI/Events of Note  Anemia - Hgb = 6.8.  eICU Interventions  Will transfuse 1 unit PRBC.     Intervention Category Major Interventions: Other:  Lenell Antu 06/21/2018, 5:16 AM

## 2018-06-21 NOTE — Progress Notes (Signed)
RT NOTES: Patient placed on 35% aerosol trach collar per Dr Vassie Loll. RR 22, Sats 97%, no distress noted. Will continue to monitor. RN at bedside.

## 2018-06-21 NOTE — Progress Notes (Signed)
RT NOTES: Patient placed back on vent at this time to rest. RN at bedside.

## 2018-06-21 NOTE — Progress Notes (Signed)
72 year old man admitted 10/20 post cardiac arrest with PEA received TNKase in the ED for presumed PE due to bedside echo showing dilated RV. He has a history of prior PE and anticoagulation was discontinued in 2018. Duplex showed left popliteal DVT He was in severe cor pulmonale on high-dose epinephrine, Levophed, vasopressin and dobutamine.  Course complicated by multiorgan failure requiring  CRRT and prolonged ventilation requiring tracheostomy on 11/2.  CT chest abdomen pelvis did not show any evidence of pneumonia or internal bleeding.  Head CT has been negative without any evidence of stroke.  He was on high-dose levo fed over the weekend but has transition off with volume and discontinuing propofol.  On exam-chronic critically ill, eyes open, follows one-step commands such as sticking out his tongue, intermittent agitation per RN, decreased breath sounds bilateral, no bleeding around trach site, S1-S2 tacky, ischemic toes with peeling off skin over feet.  Chest x-ray personally reviewed which shows no new infiltrates or effusions.  Labs show anemia with drop in hemoglobin to 6.8.  Impression/plan Acute respiratory failure-he appears to have Cheyne-Stokes breathing with a respiratory rate increasing up to the high 30s. Will trial trach collar and accept higher respiratory rate.  Acute encephalopathy-he is following commands, once again we will try to minimize Versed. Keep Seroquel at 200 twice daily and increase clonazepam to 1 twice daily. Try to avoid propofol or Precedex here due to hypotension and poor RV  Massive PE/cardiac arrest -continue IV heparin  AKI-on CRRT, can transition to intermittent dialysis if his blood pressure holds.  Once white count decreases will need permacath placed at some point, new left IJ HD catheter placed 11/4  New fever/leukocytosis-lines changed and empirically treated with vancomycin and Zosyn.  Wife updated in detail. Trying to look into  disposition, not an LTAC candidate will need vent SNF eventually with dialysis  My critical care time  X 48m  Cyril Mourning MD. FCCP. [facility] Pager [phone] If no response call [phone]   06/21/2018

## 2018-06-21 NOTE — Progress Notes (Signed)
Patient ID: Bryan Parker, male   DOB: [birth year], 72 y.o.   MRN: [id number]     Advanced Heart Failure Rounding Note  PCP-Cardiologist: Fransico Him, MD   Subjective:    Trach done 06/18/18.  HD catheter placed yesterday.   He is off norepinephrine, remains on vasopressin. CVVH running even.  Weight down 2 lbs from yesterday.   Remains on CVVHD. Keeping even.   WBC 21. Remains on Zosyn. Tm 100.7.   Blood Cx - Negative.   Daily vent weans.  He now seems to occasionally follow some commands (closing eyes, sticking out tongue).    CT head 06/18/18 with no acute abnormality.   Objective:   Weight Range: 62.9 kg Body mass index is 19.9 kg/m.   Vital Signs:   Temp:  [97.6 F (36.4 C)-100.7 F (38.2 C)] 100.7 F (38.2 C) (11/05 0400) Pulse Rate:  [78-134] 108 (11/05 0645) Resp:  [15-46] 21 (11/05 0645) BP: (95-202)/(51-178) 117/75 (11/05 0600) SpO2:  [91 %-100 %] 91 % (11/05 0645) Arterial Line BP: (92-145)/(44-67) 127/59 (11/05 0645) FiO2 (%):  [30 %-40 %] 30 % (11/05 0400) Weight:  [62.9 kg] 62.9 kg (11/05 0500) Last BM Date: 06/20/18(stool in rectal tube)  Weight change: Filed Weights   06/19/18 0600 06/20/18 0500 06/21/18 0500  Weight: 64.9 kg 63.6 kg 62.9 kg    Intake/Output:   Intake/Output Summary (Last 24 hours) at 06/21/2018 0736 Last data filed at 06/21/2018 0700 Gross per 24 hour  Intake 3473.64 ml  Output 4682 ml  Net -1208.36 ml      Physical Exam   General: Awake Neck: No JVD, no thyromegaly or thyroid nodule.  Lungs: Decreased BS at bases.  CV: Nondisplaced PMI.  Heart mildly tachy, regular S1/S2, no S3/S4, no murmur.  No peripheral edema.   Abdomen: Soft,  no hepatosplenomegaly, no distention.  Skin: Intact without lesions or rashes.  Neurologic: Alert and oriented x 3.  Psych: Normal affect. Extremities: No clubbing or cyanosis.  HEENT: Normal.    Telemetry   Sinus tachy 100s, personally reviewed.   EKG    No new tracings.     Labs    CBC Recent Labs    06/20/18 0426  06/20/18 1559 06/21/18 0400  WBC 18.3*  --   --  21.0*  HGB 7.9*   < > 9.2* 6.8*  HCT 23.1*   < > 27.0* 20.4*  MCV 89.2  --   --  87.6  PLT 557*  --   --  523*   < > = values in this interval not displayed.   Basic Metabolic Panel Recent Labs    06/20/18 0426  06/20/18 1549  06/20/18 1559 06/21/18 0400  NA 131*   < > 134*   < > 132* 133*  K 4.5   < > 4.4   < > 4.2 4.6  CL 94*   < > 86*   < > 87* 94*  CO2 20*  --  31  --   --  27  GLUCOSE 193*   < > 149*   < > 191* 132*  BUN 47*   < > 33*   < > 30* 29*  CREATININE 3.07*   < > 2.21*   < > 2.10* 2.07*  CALCIUM 8.8*  --  9.0  --   --  8.1*  MG 2.4  --   --   --   --  2.2  PHOS 5.0*  --  3.2  --   --  1.9*   < > = values in this interval not displayed.   Liver Function Tests Recent Labs    06/20/18 1549 06/21/18 0400  ALBUMIN 1.6* 1.6*   No results for input(s): LIPASE, AMYLASE in the last 72 hours. Cardiac Enzymes Recent Labs    06/19/18 0419  CKTOTAL 2,847*    BNP: BNP (last 3 results) Recent Labs    06/08/2018 0854  BNP 18.0    ProBNP (last 3 results) No results for input(s): PROBNP in the last 8760 hours.   D-Dimer No results for input(s): DDIMER in the last 72 hours. Hemoglobin A1C No results for input(s): HGBA1C in the last 72 hours. Fasting Lipid Panel No results for input(s): CHOL, HDL, LDLCALC, TRIG, CHOLHDL, LDLDIRECT in the last 72 hours. Thyroid Function Tests No results for input(s): TSH, T4TOTAL, T3FREE, THYROIDAB in the last 72 hours.  Invalid input(s): FREET3  Other results:   Imaging    Dg Chest Port 1 View  Result Date: 06/20/2018 CLINICAL DATA:  Left-sided hemodialysis catheter placement. EXAM: PORTABLE CHEST 1 VIEW COMPARISON:  06/19/2018 FINDINGS: Right IJ central venous catheter and tracheostomy tube unchanged. Enteric tube courses into the region of the stomach and off the inferior portion of the film as tip is not visualized.  Interval placement of left IJ dialysis catheter with tip over the SVC. Lungs are adequately inflated with subtle linear bibasilar density likely atelectasis. No pneumothorax. Cardiomediastinal silhouette and remainder of the exam is unchanged. IMPRESSION: Subtle bibasilar linear atelectasis. Tubes and lines as described. Electronically Signed   By: Marin Olp M.D.   On: 06/20/2018 12:47     Medications:     Scheduled Medications: . chlorhexidine gluconate (MEDLINE KIT)  15 mL Mouth Rinse BID  . Chlorhexidine Gluconate Cloth  6 each Topical Q0600  . clonazepam  0.5 mg Per Tube BID  . darbepoetin (ARANESP) injection - NON-DIALYSIS  200 mcg Subcutaneous Q Sun-1800  . hydrALAZINE  25 mg Oral Q8H  . insulin aspart  0-9 Units Subcutaneous Q4H  . insulin aspart  2 Units Subcutaneous Q4H  . mouth rinse  15 mL Mouth Rinse 10 times per day  . metoCLOPramide (REGLAN) injection  5 mg Intravenous Q6H  . pantoprazole sodium  40 mg Per Tube Q1200  . QUEtiapine  200 mg Oral BID  . sodium chloride flush  10-40 mL Intracatheter Q12H    Infusions: .  prismasol BGK 4/2.5 300 mL/hr at 06/20/18 1734  . sodium chloride 1,000 mL (06/19/18 1701)  . sodium chloride Stopped (06/17/18 1635)  . sodium chloride    . feeding supplement (VITAL AF 1.2 CAL) 1,000 mL (06/20/18 1753)  . fentaNYL infusion INTRAVENOUS 400 mcg/hr (06/21/18 0700)  . heparin 1,500 Units/hr (06/21/18 0700)  . midazolam (VERSED) infusion 5 mg/hr (06/21/18 0700)  . norepinephrine (LEVOPHED) Adult infusion Stopped (06/21/18 0647)  . piperacillin-tazobactam Stopped (06/21/18 0537)  . dialysis replacement fluid (prismasate) 350 mL/hr at 06/20/18 1446  . prismasol BGK 4/2.5 1,500 mL/hr at 06/21/18 0717  . sodium chloride    . sodium chloride    . vancomycin    . vasopressin (PITRESSIN) infusion - *FOR SHOCK* 0.03 Units/min (06/21/18 0700)    PRN Medications: sodium chloride, Place/Maintain arterial line **AND** sodium chloride,  acetaminophen **OR** acetaminophen (TYLENOL) oral liquid 160 mg/5 mL, artificial tears, fentaNYL (SUBLIMAZE) injection, Gerhardt's butt cream, heparin, hydrALAZINE, Influenza vac split quadrivalent PF, midazolam, sodium chloride, sodium chloride flush, white petrolatum    Patient Profile   Bryan Copes  Parker is a 72 y.o. 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  Presented to [facility] after PEA arrest with 10+ minutes down, presumed large PE. Now with RV failure.  Assessment/Plan   1. Shock:  Mixed cardiogenic/septic. 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 Also septic shock with very high PCT, fever, and presumed PNA.  Echo 10/2014 showed EF 60%, RV normal, PA peak pressure 73 mmHg.  Echo 06/02/2018 showed LV EF 55-60%, RV severely dilated and dysfunctional, moderate TR, moderately dilated RA, PA peak pressure 31 mmHg. Did not place RV impella with extensive bleeding post-TNKase and extensive clot.  - Remains on CVVHD. Keeping even. - Pressors off last week but restarted over weekend after fever on Friday, suspect primarily vasodilatory shock => remains on vasopressin, now off norepinephrine.  2. PEA arrest: Suspect secondary to massive PE.  Received 10+ minutes CPR. Head CT negative.  3. Presumed massive PE:  Has hx of PE in 2016, but had not been on chronic anticoagulation (off >1 year).  Lower extremity dopplers this admission positive for left popliteal acute DVT.  Received TNKase in ED.  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  - Heparin has been restarted, not bleeding from trach site   4. ID: WBC 21 today. Foley removed 10/31. Last procalcitonin was 17.2. Concerned for HCAP, possibly in setting of aspiration (lower lobe infiltrates on CXR).  Cultures remain negative.   - Remains on Zosyn.   5. AKI on CKD:  Baseline creatinine looks to be 1.6-2.2 range  (cause of baseline CKD uncertain). Remains on CVVHD, keeping even. Will likely be HD dependent.  AKI due to shock with likely contribution from rhabdomyolysis.  6. Anemia: Profuse bleeding from OGT and urethra post-TNKase.  He has received multiple units PRBCs.  Had bleeding from trach site that has stopped.  Hgb 6.6 today.  - Getting transfusion today.  7. Elevated liver enzymes: In setting of shock.  8. Acute respiratory hypoxemic respiratory failure: Trach placed 06/18/18. Weaning per CCM. 9. Thrombocytopenia:  Suspect fall in plts in setting of sepsis, critical illness/inflammation.  - Resolved.  10. Rhabdomyolysis:  CK trended down. Now 2847 06/19/18.  No role for IVF in setting of ESRD 11. Ileus: Resolving, TFs going in.  12. Neuro: Seems to follow commands on occasion, not for me at this time.   He will need LTAC.  At this point, I am not adding a lot to his care.  We will follow at a distance, please call with questions.   Length of Stay: [place], MD  06/21/2018, 7:36 AM  Advanced Heart Failure Team Pager [phone] (M-F; 7a - 4p)  Please contact [facility] for night-coverage after hours (4p -7a ) and weekends on [email or web address]

## 2018-06-21 NOTE — Progress Notes (Signed)
[facility] NEPHROLOGY PROGRESS NOTE  Assessment/ Plan: Pt is a 72 y.o. yo male with CKD (b/l cr 1.5-2 in 2016, on admission 2.5), history of PE presented with PEA arrest with hemodynamic instability and worsening renal failure.  #Acute kidney injury on CKD likely ATN in the setting of prolonged hypotension requiring pressors.  Do not think rhabdomyolysis causing renal failure.  He is anuric and currently on CRRT restarted on 11/3.  Bladder scan with no urine.  CT scan abdomen pelvis showed no hydronephrosis on 06/15/18.  -Remains anuric, output recorded as 4282 cc in 24 hours.  Running CRRT even currently.  On bags are 4K and IV heparin for systemic anticoagulation.  Continue CRRT today with net ultrafiltration of 50 cc an hour as tolerated.  Discussed with ICU nurse.  Reevaluation in the morning.  -The temporary dialysis catheter was changed to L IJ on 06/20/2018.  #Rhabdomyolysis due to trauma/code: CK level is trending down.  #Hypotension/shock: Patient is off Levophed and on vasopressin today.  Monitor blood pressure in ICU.  #Anemia multifactorial etiology including bleeding from the tracheostomy site: Plan for a unit of blood transfusion today.  Iron saturation 25% with high ferritin.  Currently on Aranesp weekly.  # Massive PE and PEA arrest: History of PE, currently on anticoagulation.  #Sepsis on Vanco and Zosyn.  Patient is still febrile.  Subjective: Seen and examined at bedside.  On trach and vent.  Not following commands.  On CRRT. Objective Vital signs in last 24 hours: Vitals:   06/21/18 0645 06/21/18 0700 06/21/18 0739 06/21/18 0800  BP:  105/72 (!) 120/54 (!) 116/58  Pulse: (!) 108 (!) 58 (!) 112   Resp: (!) 21 19 (!) 30 (!) 27  Temp:   99.6 F (37.6 C)   TempSrc:   Oral   SpO2: 91% 93% 100% 100%  Weight:      Height:       Weight change: -0.7 kg  Intake/Output Summary (Last 24 hours) at 06/21/2018 0835 Last data filed at 06/21/2018 0800 Gross per  24 hour  Intake 3395.54 ml  Output 4628 ml  Net -1232.46 ml       Labs: Basic Metabolic Panel: Recent Labs  Lab 06/20/18 0426  06/20/18 1549 06/20/18 1558 06/20/18 1559 06/21/18 0400  NA 131*   < > 134* 131* 132* 133*  K 4.5   < > 4.4 4.5 4.2 4.6  CL 94*   < > 86* 90* 87* 94*  CO2 20*  --  31  --   --  27  GLUCOSE 193*   < > 149* 155* 191* 132*  BUN 47*   < > 33* 35* 30* 29*  CREATININE 3.07*   < > 2.21* 2.60* 2.10* 2.07*  CALCIUM 8.8*  --  9.0  --   --  8.1*  PHOS 5.0*  --  3.2  --   --  1.9*   < > = values in this interval not displayed.   Liver Function Tests: Recent Labs  Lab 06/15/18 0347  06/20/18 0426 06/20/18 1549 06/21/18 0400  AST 335*  --   --   --   --   ALT 163*  --   --   --   --   ALKPHOS 118  --   --   --   --   BILITOT 5.5*  --   --   --   --   PROT 6.1*  --   --   --   --  ALBUMIN 1.8*   < > 1.6* 1.6* 1.6*   < > = values in this interval not displayed.   No results for input(s): LIPASE, AMYLASE in the last 168 hours. No results for input(s): AMMONIA in the last 168 hours. CBC: Recent Labs  Lab 06/18/18 0338 06/19/18 0419  06/19/18 2211  06/20/18 0426  06/20/18 1558 06/20/18 1559 06/21/18 0400  WBC 22.7* 21.9*  --  18.9*  --  18.3*  --   --   --  21.0*  HGB 9.6* 8.1*   < > 8.0*   < > 7.9*   < > 8.2* 9.2* 6.8*  HCT 29.3* 25.7*   < > 23.2*   < > 23.1*   < > 24.0* 27.0* 20.4*  MCV 87.2 88.9  --  88.5  --  89.2  --   --   --  87.6  PLT 572* 590*  --  512*  --  557*  --   --   --  523*   < > = values in this interval not displayed.   Cardiac Enzymes: Recent Labs  Lab 06/16/18 0340 06/19/18 0419  CKTOTAL 7,710* 2,847*   CBG: Recent Labs  Lab 06/20/18 0755 06/20/18 1952 06/21/18 0006 06/21/18 0350 06/21/18 0733  GLUCAP 107* 129* 153* 115* 134*    Iron Studies:  Recent Labs    06/21/18 0400  IRON 50  TIBC 200*  FERRITIN 1,101*   Studies/Results: Dg Chest Port 1 View  Result Date: 06/20/2018 CLINICAL DATA:   Left-sided hemodialysis catheter placement. EXAM: PORTABLE CHEST 1 VIEW COMPARISON:  06/19/2018 FINDINGS: Right IJ central venous catheter and tracheostomy tube unchanged. Enteric tube courses into the region of the stomach and off the inferior portion of the film as tip is not visualized. Interval placement of left IJ dialysis catheter with tip over the SVC. Lungs are adequately inflated with subtle linear bibasilar density likely atelectasis. No pneumothorax. Cardiomediastinal silhouette and remainder of the exam is unchanged. IMPRESSION: Subtle bibasilar linear atelectasis. Tubes and lines as described. Electronically Signed   By: Marin Olp M.D.   On: 06/20/2018 12:47    Medications: Infusions: .  prismasol BGK 4/2.5 300 mL/hr at 06/20/18 1734  . sodium chloride 1,000 mL (06/19/18 1701)  . sodium chloride Stopped (06/17/18 1635)  . sodium chloride    . feeding supplement (VITAL AF 1.2 CAL) 1,000 mL (06/20/18 1753)  . fentaNYL infusion INTRAVENOUS 200 mcg/hr (06/21/18 0800)  . heparin 1,500 Units/hr (06/21/18 0800)  . midazolam (VERSED) infusion 2 mg/hr (06/21/18 0800)  . norepinephrine (LEVOPHED) Adult infusion Stopped (06/21/18 0647)  . piperacillin-tazobactam Stopped (06/21/18 0537)  . dialysis replacement fluid (prismasate) 350 mL/hr at 06/20/18 1446  . prismasol BGK 4/2.5 1,500 mL/hr at 06/21/18 0717  . sodium chloride    . sodium chloride    . vancomycin    . vasopressin (PITRESSIN) infusion - *FOR SHOCK* 0.03 Units/min (06/21/18 0800)    Scheduled Medications: . chlorhexidine gluconate (MEDLINE KIT)  15 mL Mouth Rinse BID  . Chlorhexidine Gluconate Cloth  6 each Topical Q0600  . clonazepam  0.5 mg Per Tube BID  . darbepoetin (ARANESP) injection - NON-DIALYSIS  200 mcg Subcutaneous Q Sun-1800  . hydrALAZINE  25 mg Oral Q8H  . insulin aspart  0-9 Units Subcutaneous Q4H  . insulin aspart  2 Units Subcutaneous Q4H  . mouth rinse  15 mL Mouth Rinse 10 times per day  .  metoCLOPramide (REGLAN) injection  5 mg Intravenous Q6H  .  pantoprazole sodium  40 mg Per Tube Q1200  . QUEtiapine  200 mg Oral BID  . sodium chloride flush  10-40 mL Intracatheter Q12H    have reviewed scheduled and prn medications.  Physical Exam: General: Trach, on vent and sedated Heart:RRR, s1s2 nl Lungs: Coarse breath sound bilateral, no wheezing Abdomen:soft, Non-tender, non-distended Extremities:No edema Dialysis Access: Left IJ temporary catheter exchanged on 06/20/2018.  Bryan Parker 06/21/2018,8:35 AM  LOS: 16 days

## 2018-06-21 NOTE — Progress Notes (Signed)
ANTICOAGULATION CONSULT NOTE - Follow Up Consult  Pharmacy Consult for heparin Indication: VTE  Labs: Recent Labs    06/19/18 0419 06/19/18 1003  06/19/18 2211  06/20/18 0426  06/20/18 1558 06/20/18 1559  06/21/18 0400 06/21/18 1248 06/21/18 1524 06/21/18 2230  HGB 8.1*  --    < > 8.0*   < > 7.9*   < > 8.2* 9.2*  --  6.8*  --   --   --   HCT 25.7*  --    < > 23.2*   < > 23.1*   < > 24.0* 27.0*  --  20.4*  --   --   --   PLT 590*  --   --  512*  --  557*  --   --   --   --  523*  --   --   --   APTT  --  75*  --   --   --   --   --   --   --   --   --   --   --   --   LABPROT  --  14.4  --   --   --   --   --   --   --   --   --   --   --   --   INR  --  1.13  --   --   --   --   --   --   --   --   --   --   --   --   HEPARINUNFRC 0.51  --   --   --   --   --   --   --   --    < > 0.75* 0.69  --  0.36  CREATININE 3.63*  --    < >  --    < > 3.07*   < > 2.60* 2.10*  --  2.07*  --  1.80*  --   CKTOTAL 2,847*  --   --   --   --   --   --   --   --   --   --   --   --   --    < > = values in this interval not displayed.    Assessment/Plan:  72yo male therapeutic on heparin after rate changes. Will continue gtt at current rate and confirm stable with am labs.   Vernard Gambles, PharmD, BCPS  06/21/2018,11:55 PM

## 2018-06-21 NOTE — Progress Notes (Signed)
CRITICAL VALUE ALERT  Critical Value:  Hgb on CBC 6.8  Date & Time Notied:  06/21/18 0500  Provider Notified: Pola Corn Dr. Arsenio Loader   Orders Received/Actions taken: Orders to transfuse one unit RBCs

## 2018-06-21 NOTE — Plan of Care (Signed)
  Problem: Education: Goal: Knowledge about tracheostomy care/management will improve Outcome: Progressing Note:  Family educated   Problem: Respiratory: Goal: Patent airway maintenance will improve Outcome: Progressing   Problem: Activity: Goal: Ability to tolerate increased activity will improve Outcome: Not Progressing   Problem: Health Behavior/Discharge Planning: Goal: Ability to manage tracheostomy will improve Outcome: Not Progressing   Problem: Role Relationship: Goal: Ability to communicate will improve Outcome: Not Progressing

## 2018-06-22 LAB — HEPARIN LEVEL (UNFRACTIONATED)
HEPARIN UNFRACTIONATED: 0.25 [IU]/mL — AB (ref 0.30–0.70)
Heparin Unfractionated: 0.17 IU/mL — ABNORMAL LOW (ref 0.30–0.70)

## 2018-06-22 LAB — PROCALCITONIN: Procalcitonin: 4.75 ng/mL

## 2018-06-22 LAB — CALCIUM, IONIZED: CALCIUM, IONIZED, SERUM: 4.1 mg/dL — AB (ref 4.5–5.6)

## 2018-06-22 LAB — RENAL FUNCTION PANEL
ANION GAP: 8 (ref 5–15)
ANION GAP: 9 (ref 5–15)
Albumin: 1.6 g/dL — ABNORMAL LOW (ref 3.5–5.0)
Albumin: 1.6 g/dL — ABNORMAL LOW (ref 3.5–5.0)
BUN: 25 mg/dL — ABNORMAL HIGH (ref 8–23)
BUN: 30 mg/dL — ABNORMAL HIGH (ref 8–23)
CO2: 26 mmol/L (ref 22–32)
CO2: 26 mmol/L (ref 22–32)
CREATININE: 2.2 mg/dL — AB (ref 0.61–1.24)
Calcium: 8.2 mg/dL — ABNORMAL LOW (ref 8.9–10.3)
Calcium: 8.3 mg/dL — ABNORMAL LOW (ref 8.9–10.3)
Chloride: 100 mmol/L (ref 98–111)
Chloride: 99 mmol/L (ref 98–111)
Creatinine, Ser: 1.66 mg/dL — ABNORMAL HIGH (ref 0.61–1.24)
GFR calc non Af Amer: 40 mL/min — ABNORMAL LOW (ref >60)
GFR, EST AFRICAN AMERICAN: 46 mL/min — AB (ref >60)
GFR, EST AFRICAN AMERICAN: 33 mL/min — AB (ref >60)
GFR, EST NON AFRICAN AMERICAN: 28 mL/min — AB (ref >60)
GLUCOSE: 144 mg/dL — AB (ref 70–99)
Glucose, Bld: 143 mg/dL — ABNORMAL HIGH (ref 70–99)
PHOSPHORUS: 2.5 mg/dL (ref 2.5–4.6)
Phosphorus: 2.4 mg/dL — ABNORMAL LOW (ref 2.5–4.6)
Potassium: 4.8 mmol/L (ref 3.5–5.1)
Potassium: 4.3 mmol/L (ref 3.5–5.1)
Sodium: 134 mmol/L — ABNORMAL LOW (ref 135–145)
Sodium: 134 mmol/L — ABNORMAL LOW (ref 135–145)

## 2018-06-22 LAB — CBC
HEMATOCRIT: 23.8 % — AB (ref 39.0–52.0)
HEMOGLOBIN: 7.6 g/dL — AB (ref 13.0–17.0)
MCH: 28.7 pg (ref 26.0–34.0)
MCHC: 31.9 g/dL (ref 30.0–36.0)
MCV: 89.8 fL (ref 80.0–100.0)
Platelets: 521 10*3/uL — ABNORMAL HIGH (ref 150–400)
RBC: 2.65 MIL/uL — ABNORMAL LOW (ref 4.22–5.81)
RDW: 19.2 % — AB (ref 11.5–15.5)
WBC: 18 10*3/uL — AB (ref 4.0–10.5)
nRBC: 5.4 % — ABNORMAL HIGH (ref 0.0–0.2)

## 2018-06-22 LAB — GLUCOSE, CAPILLARY
GLUCOSE-CAPILLARY: 138 mg/dL — AB (ref 70–99)
GLUCOSE-CAPILLARY: 139 mg/dL — AB (ref 70–99)
GLUCOSE-CAPILLARY: 140 mg/dL — AB (ref 70–99)
Glucose-Capillary: 115 mg/dL — ABNORMAL HIGH (ref 70–99)
Glucose-Capillary: 134 mg/dL — ABNORMAL HIGH (ref 70–99)

## 2018-06-22 LAB — MAGNESIUM: MAGNESIUM: 2.5 mg/dL — AB (ref 1.7–2.4)

## 2018-06-22 MED ORDER — PIPERACILLIN-TAZOBACTAM 3.375 G IVPB 30 MIN: 3.3750 g | Freq: Two times a day (BID) | INTRAVENOUS | Status: DC

## 2018-06-22 MED ORDER — PIPERACILLIN-TAZOBACTAM 3.375 G IVPB
3.3750 g | Freq: Two times a day (BID) | INTRAVENOUS | Status: DC
  Administered 2018-06-23 – 2018-06-27 (×10): 3.375 g via INTRAVENOUS
  Filled 2018-06-22 (×9): qty 50

## 2018-06-22 NOTE — Progress Notes (Signed)
Paged Vassie Loll MD about patient's agitation, tachypnea, and tachycardia. 50 mcg of fentanyl IVP given prior to calling. Ordered to restart fentanyl gtt and maybe try trach collar again if still agitated.

## 2018-06-22 NOTE — Progress Notes (Signed)
72 year old man admitted 10/20 post cardiac arrest with PEA received TNKase in the EDfor presumed PEdue to bedside echo showing dilated RV. He has a history of prior PE and anticoagulation was discontinued in 2018. Duplex showed left popliteal DVT He wasin severe cor pulmonale on high-dose epinephrine, Levophed, vasopressin and dobutamine.  Course complicated by multiorgan failure requiring  CRRT and prolonged ventilation requiring tracheostomy on 11/2.  CT chest abdomen pelvis did not show any evidence of pneumonia or internal bleeding.  Head CT head was negative without any evidence of stroke.  11/5 tolerated trach collar for 8 hours, transition off levo fed and Versed drip.  On exam-chronically ill-appearing, follows one-step commands intermittently, agitation is decreased, decreased breath sounds bilateral, no more bleeding around trach site, S1-S2 remains tacky, peeling of skin over feet with ischemic dark toes.  Labs show hemoglobin improved to 7.6 with 1 unit PRBC from 6.8, leukocytosis decreased from 21-18.  Impression/plan Acute respiratory failure-tolerating trach collar D10, will trial 24 hours. He has Cheyne-Stokes breathing and okay to accept higher respiratory rate.  Acute encephalopathy-off Versed drip, minimize fentanyl and switch to intermittent. Keep Seroquel at 200 twice daily and clonazepam to 1 mg twice daily. Avoid Precedex and propofol here since he develops hypotension  Massive PE/cardiac arrest-continue IV heparin  New fever/leukocytosis -empiric antibiotics, continue vancomycin until leukocytosis improves, procalcitonin still high.  AKI-give a break from CRRT today, has new left IJ dialysis catheter on 11/4, will need permacath by next week once WBC count improves.  We will hold off on PEG tube and see if he gets stronger enough to be able to swallow  Wife updated daily. My critical care time x 13m   Cyril Mourning MD. FCCP. [facility] Pager [phone] If no response call [phone]   06/22/2018

## 2018-06-22 NOTE — Progress Notes (Signed)
ANTICOAGULATION CONSULT NOTE   Pharmacy Consult for Heparin Indication: suspected pulmonary embolus and confirmed DVT  No Known Allergies  Patient Measurements: Height: 5\' 10"  (177.8 cm) Weight: 134 lb 14.7 oz (61.2 kg) IBW/kg (Calculated) : 73  Vital Signs: Temp: 99.2 F (37.3 C) (11/06 0715) Temp Source: Axillary (11/06 0715) BP: 94/45 (11/06 0900) Pulse Rate: 117 (11/06 0815)  Labs: Recent Labs    06/20/18 0426  06/20/18 1559  06/21/18 0400 06/21/18 1248 06/21/18 1524 06/21/18 2230 06/22/18 0410 06/22/18 0500  HGB 7.9*   < > 9.2*  --  6.8*  --   --   --  7.6*  --   HCT 23.1*   < > 27.0*  --  20.4*  --   --   --  23.8*  --   PLT 557*  --   --   --  523*  --   --   --  521*  --   HEPARINUNFRC  --   --   --    < > 0.75* 0.69  --  0.36  --  0.25*  CREATININE 3.07*   < > 2.10*  --  2.07*  --  1.80*  --  1.66*  --    < > = values in this interval not displayed.    Estimated Creatinine Clearance: 35.3 mL/min (A) (by C-G formula based on SCr of 1.66 mg/dL (H)).  Assessment: 72yo male with DVT and likely PE s/p PEA arrest and TNKase 10/20 for heparin. Currently on CRRT. Trach site bleeding now resolved and heparin was restarted yesterday.  AM heparin level below goal at 0.25 on heparin 1250 units/hr. No s/sx of bleeding or issues with infusion reported. CBC improved today.  Goal of Therapy:  Heparin level 0.3-0.5 units/ml Monitor platelets by anticoagulation protocol: Yes   Plan:  Increase heparin to 1350 units/hr Heparin level in ~8 hours Daily heparin level and CBC  Thanks for allowing pharmacy to be a part of this patient's care.  Arvilla Market, PharmD PGY1 Pharmacy Resident Phone [phone] 06/22/2018     10:12 AM

## 2018-06-22 NOTE — Progress Notes (Signed)
RT NOTES: Patient placed back on vent on full support. Patient is tired and showing signs of respiratory distress. Nurse aware and at bedside.

## 2018-06-22 NOTE — Progress Notes (Signed)
CRRT discontinued at 1015 per MD order.

## 2018-06-22 NOTE — Progress Notes (Signed)
[facility] NEPHROLOGY PROGRESS NOTE  Assessment/ Plan: Pt is a 72 y.o. yo male with CKD (b/l cr 1.5-2 in 2016, on admission 2.5), history of PE presented with PEA arrest with hemodynamic instability and worsening renal failure.  #Acute kidney injury on CKD likely ATN in the setting of prolonged hypotension requiring pressors.  Do not think rhabdomyolysis causing renal failure.  -Restarted CRRT on 11/3.  Ultrafiltration of around 3.6 L.  He is now off pain and off pressors.  Plan to discontinue CRRT today.  Monitor urine output and BMP.  He will likely need dialysis if no improvement in kidney function. -Bladder scan with no urine.  CT scan abdomen pelvis showed no hydronephrosis on 06/15/18.  -The temporary dialysis catheter was changed to L IJ on 06/20/2018.  #Rhabdomyolysis due to trauma/code: CK level is trending down.  #Hypotension/shock: Patient is off Levophed and vasopressin today.  Monitor blood pressure in ICU.  Blood pressure is better.  #Anemia multifactorial etiology including bleeding from the tracheostomy site: Received a unit of blood transfusion yesterday.  Hemoglobin 7.6.   Iron saturation 25% with high ferritin.  Currently on Aranesp weekly.  # Massive PE and PEA arrest: History of PE, currently on anticoagulation.  #Sepsis on Vanco and Zosyn.  Patient is afebrile.  Discussed with ICU team.  Subjective: Seen and examined at bedside.  Tolerating CRRT well.  He is off of vent and pressors.  He is following simple commands.  No urine output yet.  Objective Vital signs in last 24 hours: Vitals:   06/22/18 0745 06/22/18 0800 06/22/18 0815 06/22/18 0830  BP:  (!) 110/47  (!) 91/54  Pulse:  (!) 115 (!) 117   Resp: (!) 31 (!) 30 (!) 26 (!) 28  Temp:      TempSrc:      SpO2:  98% 98% 98%  Weight:      Height:       Weight change: -1.7 kg  Intake/Output Summary (Last 24 hours) at 06/22/2018 0854 Last data filed at 06/22/2018 0800 Gross per 24 hour   Intake 2990.4 ml  Output 3707 ml  Net -716.6 ml       Labs: Basic Metabolic Panel: Recent Labs  Lab 06/21/18 0400 06/21/18 1524 06/22/18 0410  NA 133* 134* 134*  K 4.6 4.7 4.8  CL 94* 97* 100  CO2 _0 GLUCOSE 132* 135* 144*  BUN 29* 26* 25*  CREATININE 2.07* 1.80* 1.66*  CALCIUM 8.1* 8.0* 8.2*  PHOS 1.9* 3.0 2.4*   Liver Function Tests: Recent Labs  Lab 06/21/18 0400 06/21/18 1524 06/22/18 0410  ALBUMIN 1.6* 1.5* 1.6*   No results for input(s): LIPASE, AMYLASE in the last 168 hours. No results for input(s): AMMONIA in the last 168 hours. CBC: Recent Labs  Lab 06/19/18 0419  06/19/18 2211  06/20/18 0426  06/20/18 1559 06/21/18 0400 06/22/18 0410  WBC 21.9*  --  18.9*  --  18.3*  --   --  21.0* 18.0*  HGB 8.1*   < > 8.0*   < > 7.9*   < > 9.2* 6.8* 7.6*  HCT 25.7*   < > 23.2*   < > 23.1*   < > 27.0* 20.4* 23.8*  MCV 88.9  --  88.5  --  89.2  --   --  87.6 89.8  PLT 590*  --  512*  --  557*  --   --  523* 521*   < > = values  in this interval not displayed.   Cardiac Enzymes: Recent Labs  Lab 06/16/18 0340 06/19/18 0419  CKTOTAL 7,710* 2,847*   CBG: Recent Labs  Lab 06/21/18 1529 06/21/18 1957 06/22/18 0015 06/22/18 0419 06/22/18 0753  GLUCAP 124* 140* 139* 138* 115*    Iron Studies:  Recent Labs    06/21/18 0400  IRON 50  TIBC 200*  FERRITIN 1,101*   Studies/Results: Dg Chest Port 1 View  Result Date: 06/20/2018 CLINICAL DATA:  Left-sided hemodialysis catheter placement. EXAM: PORTABLE CHEST 1 VIEW COMPARISON:  06/19/2018 FINDINGS: Right IJ central venous catheter and tracheostomy tube unchanged. Enteric tube courses into the region of the stomach and off the inferior portion of the film as tip is not visualized. Interval placement of left IJ dialysis catheter with tip over the SVC. Lungs are adequately inflated with subtle linear bibasilar density likely atelectasis. No pneumothorax. Cardiomediastinal silhouette and remainder of the  exam is unchanged. IMPRESSION: Subtle bibasilar linear atelectasis. Tubes and lines as described. Electronically Signed   By: Marin Olp M.D.   On: 06/20/2018 12:47    Medications: Infusions: .  prismasol BGK 4/2.5 300 mL/hr at 06/22/18 0614  . sodium chloride 1,000 mL (06/19/18 1701)  . sodium chloride 10 mL/hr at 06/22/18 0800  . sodium chloride    . feeding supplement (VITAL AF 1.2 CAL) 60 mL/hr at 06/22/18 0700  . fentaNYL infusion INTRAVENOUS 175 mcg/hr (06/22/18 0800)  . heparin 1,350 Units/hr (06/22/18 0800)  . midazolam (VERSED) infusion Stopped (06/21/18 1009)  . norepinephrine (LEVOPHED) Adult infusion Stopped (06/22/18 0732)  . piperacillin-tazobactam Stopped (06/22/18 8889)  . dialysis replacement fluid (prismasate) 350 mL/hr at 06/21/18 2254  . prismasol BGK 4/2.5 1,500 mL/hr at 06/22/18 0640  . sodium chloride    . sodium chloride    . vancomycin Stopped (06/21/18 2335)  . vasopressin (PITRESSIN) infusion - *FOR SHOCK* Stopped (06/21/18 0930)    Scheduled Medications: . chlorhexidine gluconate (MEDLINE KIT)  15 mL Mouth Rinse BID  . Chlorhexidine Gluconate Cloth  6 each Topical Q0600  . clonazepam  1 mg Per Tube BID  . darbepoetin (ARANESP) injection - NON-DIALYSIS  200 mcg Subcutaneous Q Sun-1800  . hydrALAZINE  25 mg Oral Q8H  . insulin aspart  0-9 Units Subcutaneous Q4H  . insulin aspart  2 Units Subcutaneous Q4H  . mouth rinse  15 mL Mouth Rinse 10 times per day  . metoCLOPramide (REGLAN) injection  5 mg Intravenous Q6H  . pantoprazole sodium  40 mg Per Tube Q1200  . QUEtiapine  200 mg Oral BID  . sodium chloride flush  10-40 mL Intracatheter Q12H    have reviewed scheduled and prn medications.  Physical Exam: General: On trach collar, following simple commands Heart:RRR, s1s2 nl Lungs: Coarse breath sound bilateral, no wheezing Abdomen:soft, Non-tender, non-distended Extremities: No edema. Dialysis Access: Left IJ temporary catheter exchanged on  06/20/2018.  Kamylle Axelson Prasad Dustan Hyams 06/22/2018,8:54 AM  LOS: 17 days

## 2018-06-22 NOTE — Progress Notes (Signed)
80ml of versed wasted in waste bin with [facility] RN Modena Jansky RN 2 Heart CV[facility]

## 2018-06-22 NOTE — Progress Notes (Signed)
200 mL Fentanyl wasted in liquid narcotics container with Annamaria Boots, RN as witness.

## 2018-06-22 NOTE — Progress Notes (Signed)
Pharmacy Antibiotic Note  Bryan Parker is a 72 y.o. male admitted on 06/10/2018 with possible aspiration pna/sepsis. Pharmacy has been consulted for Zosyn dosing. Afebrile and WBC down to 18. All cultures negative. CRRT stopped this AM at 1015    Plan: -Continue vancomycin 500 mg q24h through tonight  -Decrease zosyn to 3.375 g IV q12h for decreased renal function off CRRT  -Will continue to monitor renal function/HD plans, clinical improvement, and length of therapy   Height: 5\' 10"  (177.8 cm) Weight: 134 lb 14.7 oz (61.2 kg) IBW/kg (Calculated) : 73  Temp (24hrs), Avg:98.9 F (37.2 C), Min:97.8 F (36.6 C), Max:99.3 F (37.4 C)  Recent Labs  Lab 06/19/18 0419  06/19/18 2211  06/20/18 0426  06/20/18 1549 06/20/18 1558 06/20/18 1559 06/21/18 0400 06/21/18 1524 06/22/18 0410  WBC 21.9*  --  18.9*  --  18.3*  --   --   --   --  21.0*  --  18.0*  CREATININE 3.63*   < >  --    < > 3.07*   < > 2.21* 2.60* 2.10* 2.07* 1.80* 1.66*  VANCOTROUGH  --   --   --   --   --   --  26*  --   --   --   --   --    < > = values in this interval not displayed.    Estimated Creatinine Clearance: 35.3 mL/min (A) (by C-G formula based on SCr of 1.66 mg/dL (H)).    No Known Allergies  Dose adjustments this admission: 10/23 VR = 22 - vanc 750 mg q24h, pt started on CRRT (zosyn adjusted for CRRT) 10/25 VR = 36 - drawn after vancomycin was given 11/4 VT = 26 - dose already given prior to result, dec to 500 q24   Antimicrobials:  Zosyn 10/20>>10/31; 11/3 >>  Vancomycin 10/20 >> 10/25; 10/29>>(11/6)  Cultures: 11/3: TA: few candida no organisms seen 10/29 BCx: Negative 10/29 TA: few candida, rare GPC  10/20 BCx: Negative 10/20 TA: normal flora 10/20 MRSA PCR: negative   Thank you for involving pharmacy in this patient's care.  Arvilla Market, PharmD PGY1 Pharmacy Resident Phone [phone] 06/22/2018     1:44 PM

## 2018-06-22 NOTE — Evaluation (Signed)
Physical Therapy Evaluation Patient Details Name: Bryan Parker MRN: [id number] DOB: Feb 13, 1946 Today's Date: 06/22/2018   History of Present Illness  Pt is a 72 y.o. M with significant PMH of HTN, DVT/PE March 2016 NOT on chronic anticoagulation, CKD and prior tobacco use. Presented after PEA arrest with 10+ minutes down and now with RV failure. Course complicated by multiorgan failure requiring CRRT and prolonged ventilation and tracheostomy on 11/2. Has new left IJ dialysis catheter on 11/4.  Clinical Impression  Pt admitted with above diagnosis. Pt currently with functional limitations due to the deficits listed below (see PT Problem List). On PT evaluation, patient presenting with decreased functional mobility secondary to cognitive impairments, weakness, decreased range of motion, and poor balance. Requiring up to two person maximal assistance for bed mobility. Ordered bilateral PRAFO boots for improved ankle positioning to prevent contractures. Repositioned patient in chair position with neutral cervical alignment as patient tends to display left lateral cervical flexion. Pt will benefit from skilled PT to increase their independence and safety with mobility to allow discharge to the venue listed below.    Prior to mobility: 110 HR, 100% on 8L via 35% trach, 110/43 BP, 28 RR During mobility: 121 HR, 64/47 BP (potentially inaccurate) and reassessed to be 109/43 BP, up to 49 RR     Follow Up Recommendations SNF;Supervision/Assistance - 24 hour (noted LTAC denial)    Equipment Recommendations  Other (comment)(TBD)    Recommendations for Other Services OT consult     Precautions / Restrictions Precautions Precautions: Fall Precaution Comments: watch RR Restrictions Weight Bearing Restrictions: No      Mobility  Bed Mobility Overal bed mobility: Needs Assistance Bed Mobility: Rolling;Sidelying to Sit;Sit to Supine Rolling: Mod assist Sidelying to sit: Max assist;+2 for  safety/equipment     Sit to sidelying: Max assist;+2 for physical assistance General bed mobility comments: Patient requiring modA for rolling towards left, maxA for sidelying to sit, and maxA + 2 for return to supine  Transfers                 General transfer comment: deferred  Ambulation/Gait                Stairs            Wheelchair Mobility    Modified Rankin (Stroke Patients Only)       Balance Overall balance assessment: Needs assistance Sitting-balance support: Bilateral upper extremity supported;Feet supported Sitting balance-Leahy Scale: Poor Sitting balance - Comments: Requiring up to maximal assistance for sitting edge of bed with right lateral lean. Poor balance reactions                                     Pertinent Vitals/Pain Pain Assessment: Faces Faces Pain Scale: Hurts little more Pain Location: generalized with movement Pain Descriptors / Indicators: Grimacing Pain Intervention(s): Monitored during session;Limited activity within patient's tolerance    Home Living Family/patient expects to be discharged to:: Unsure Living Arrangements: Spouse/significant other               Additional Comments: Pt unable to provide home living    Prior Function           Comments: Pt unable to provide PLOF     Hand Dominance        Extremity/Trunk Assessment   Upper Extremity Assessment Upper Extremity Assessment: LUE deficits/detail;Generalized weakness LUE Deficits /  Details: Resting in internal rotation and pronation    Lower Extremity Assessment Lower Extremity Assessment: Generalized weakness;RLE deficits/detail;LLE deficits/detail RLE Deficits / Details: RLE resting in internal rotation. Ankle dorsiflexion limited ~10 degrees from neutral LLE Deficits / Details: Ankle dorsiflexion limited ~5 degrees from neutral    Cervical / Trunk Assessment Cervical / Trunk Assessment: Other exceptions Cervical  / Trunk Exceptions: Cervical lateral flexion towards left  Communication   Communication: Tracheostomy  Cognition Arousal/Alertness: Awake/alert Behavior During Therapy: Flat affect Overall Cognitive Status: Difficult to assess Area of Impairment: Following commands                       Following Commands: Follows one step commands inconsistently       General Comments: Follows one step commands < 25% of the time.       General Comments      Exercises Other Exercises Other Exercises: Dynamic sitting balance including lateral leans onto right and left elbow with assist back up to upright.     Assessment/Plan    PT Assessment Patient needs continued PT services  PT Problem List Decreased strength;Decreased range of motion;Decreased activity tolerance;Decreased balance;Decreased mobility;Decreased cognition       PT Treatment Interventions DME instruction;Gait training;Functional mobility training;Therapeutic activities;Therapeutic exercise;Balance training;Patient/family education;Neuromuscular re-education    PT Goals (Current goals can be found in the Care Plan section)  Acute Rehab PT Goals Patient Stated Goal: unable to state PT Goal Formulation: Patient unable to participate in goal setting Time For Goal Achievement: 07/06/18 Potential to Achieve Goals: Fair    Frequency Min 2X/week   Barriers to discharge        Co-evaluation               AM-PAC PT "6 Clicks" Daily Activity  Outcome Measure Difficulty turning over in bed (including adjusting bedclothes, sheets and blankets)?: Unable Difficulty moving from lying on back to sitting on the side of the bed? : Unable Difficulty sitting down on and standing up from a chair with arms (e.g., wheelchair, bedside commode, etc,.)?: Unable Help needed moving to and from a bed to chair (including a wheelchair)?: A Lot Help needed walking in hospital room?: Total Help needed climbing 3-5 steps with a  railing? : Total 6 Click Score: 7    End of Session Equipment Utilized During Treatment: Oxygen Activity Tolerance: Patient tolerated treatment well Patient left: in bed;with call bell/phone within reach;with restraints reapplied Nurse Communication: Mobility status PT Visit Diagnosis: Other abnormalities of gait and mobility (R26.89);Muscle weakness (generalized) (M62.81)    Time: 8657-8469 PT Time Calculation (min) (ACUTE ONLY): 30 min   Charges:   PT Evaluation $PT Eval High Complexity: 1 High PT Treatments $Therapeutic Activity: 8-22 mins       Laurina Bustle, PT, DPT Acute Rehabilitation Services Pager [phone] Office [phone]   Vanetta Mulders 06/22/2018, 3:00 PM

## 2018-06-22 NOTE — Progress Notes (Signed)
Orthopedic Tech Progress Note Patient Details:  Bryan Parker 01/19/1946 [id number]  Ortho Devices Type of Ortho Device: Prafo boot/shoe Ortho Device/Splint Location: bilateral Ortho Device/Splint Interventions: Application   Post Interventions Patient Tolerated: Well Instructions Provided: Care of device   Nikki Dom 06/22/2018, 2:24 PM

## 2018-06-22 NOTE — Progress Notes (Signed)
ANTICOAGULATION CONSULT NOTE   Pharmacy Consult for Heparin Indication: suspected pulmonary embolus and confirmed DVT  No Known Allergies  Patient Measurements: Height: 5\' 10"  (177.8 cm) Weight: 134 lb 14.7 oz (61.2 kg) IBW/kg (Calculated) : 73  Vital Signs: Temp: 101.2 F (38.4 C) (11/06 1545) Temp Source: Axillary (11/06 1545) BP: 104/65 (11/06 1600) Pulse Rate: 138 (11/06 1600)  Labs: Recent Labs    06/20/18 0426  06/20/18 1559  06/21/18 0400  06/21/18 1524 06/21/18 2230 06/22/18 0410 06/22/18 0500 06/22/18 1530  HGB 7.9*   < > 9.2*  --  6.8*  --   --   --  7.6*  --   --   HCT 23.1*   < > 27.0*  --  20.4*  --   --   --  23.8*  --   --   PLT 557*  --   --   --  523*  --   --   --  521*  --   --   HEPARINUNFRC  --   --   --    < > 0.75*   < >  --  0.36  --  0.25* 0.17*  CREATININE 3.07*   < > 2.10*  --  2.07*  --  1.80*  --  1.66*  --  2.20*   < > = values in this interval not displayed.    Estimated Creatinine Clearance: 26.7 mL/min (A) (by C-G formula based on SCr of 2.2 mg/dL (H)).  Assessment: 72yo male with DVT and likely PE s/p PEA arrest and TNKase 10/20 for heparin. Currently on CRRT. Trach site bleeding now resolved and heparin was restarted.  AM heparin level below goal at 0.25 and heparin gtt rate was increased.  Repeat heparin level now even lower despite rate increase.  Spoke with RN, no known issues with IV, heparin running peripherally and levels being drawn from A line.  No overt bleeding or complications noted.  Goal of Therapy:  Heparin level 0.3-0.5 units/ml Monitor platelets by anticoagulation protocol: Yes   Plan:  Increase heparin to 1450 units/hr Heparin level in ~8 hours Daily heparin level and CBC  Thanks for allowing pharmacy to be a part of this patient's care.  Jenetta Downer, [facility] Clinical Pharmacist Phone [phone]  06/22/2018 4:16 PM

## 2018-06-23 LAB — CBC
HEMATOCRIT: 21.7 % — AB (ref 39.0–52.0)
Hemoglobin: 6.8 g/dL — CL (ref 13.0–17.0)
MCH: 29.2 pg (ref 26.0–34.0)
MCHC: 31.3 g/dL (ref 30.0–36.0)
MCV: 93.1 fL (ref 80.0–100.0)
NRBC: 10.7 % — AB (ref 0.0–0.2)
PLATELETS: 484 10*3/uL — AB (ref 150–400)
RBC: 2.33 MIL/uL — ABNORMAL LOW (ref 4.22–5.81)
RDW: 20.9 % — AB (ref 11.5–15.5)
WBC: 19.2 10*3/uL — AB (ref 4.0–10.5)

## 2018-06-23 LAB — PREPARE RBC (CROSSMATCH)

## 2018-06-23 LAB — RENAL FUNCTION PANEL
ALBUMIN: 1.5 g/dL — AB (ref 3.5–5.0)
Albumin: 1.6 g/dL — ABNORMAL LOW (ref 3.5–5.0)
Anion gap: 13 (ref 5–15)
Anion gap: 12 (ref 5–15)
BUN: 47 mg/dL — AB (ref 8–23)
BUN: 56 mg/dL — AB (ref 8–23)
CALCIUM: 8.2 mg/dL — AB (ref 8.9–10.3)
CHLORIDE: 100 mmol/L (ref 98–111)
CO2: 21 mmol/L — ABNORMAL LOW (ref 22–32)
CO2: 22 mmol/L (ref 22–32)
CREATININE: 3.34 mg/dL — AB (ref 0.61–1.24)
CREATININE: 4.11 mg/dL — AB (ref 0.61–1.24)
Calcium: 8.3 mg/dL — ABNORMAL LOW (ref 8.9–10.3)
Chloride: 101 mmol/L (ref 98–111)
GFR calc Af Amer: 20 mL/min — ABNORMAL LOW (ref >60)
GFR calc Af Amer: 15 mL/min — ABNORMAL LOW (ref >60)
GFR calc non Af Amer: 17 mL/min — ABNORMAL LOW (ref >60)
GFR calc non Af Amer: 13 mL/min — ABNORMAL LOW (ref >60)
GLUCOSE: 160 mg/dL — AB (ref 70–99)
Glucose, Bld: 144 mg/dL — ABNORMAL HIGH (ref 70–99)
PHOSPHORUS: 2.7 mg/dL (ref 2.5–4.6)
Phosphorus: 3.1 mg/dL (ref 2.5–4.6)
Potassium: 4.7 mmol/L (ref 3.5–5.1)
Potassium: 4.8 mmol/L (ref 3.5–5.1)
SODIUM: 135 mmol/L (ref 135–145)
Sodium: 134 mmol/L — ABNORMAL LOW (ref 135–145)

## 2018-06-23 LAB — GLUCOSE, CAPILLARY
GLUCOSE-CAPILLARY: 163 mg/dL — AB (ref 70–99)
GLUCOSE-CAPILLARY: 146 mg/dL — AB (ref 70–99)
GLUCOSE-CAPILLARY: 146 mg/dL — AB (ref 70–99)
GLUCOSE-CAPILLARY: 126 mg/dL — AB (ref 70–99)
GLUCOSE-CAPILLARY: 140 mg/dL — AB (ref 70–99)
Glucose-Capillary: 114 mg/dL — ABNORMAL HIGH (ref 70–99)
Glucose-Capillary: 189 mg/dL — ABNORMAL HIGH (ref 70–99)
Glucose-Capillary: 96 mg/dL (ref 70–99)

## 2018-06-23 LAB — HEPARIN LEVEL (UNFRACTIONATED)
HEPARIN UNFRACTIONATED: 0.21 [IU]/mL — AB (ref 0.30–0.70)
Heparin Unfractionated: 0.2 IU/mL — ABNORMAL LOW (ref 0.30–0.70)
Heparin Unfractionated: 0.35 IU/mL (ref 0.30–0.70)

## 2018-06-23 LAB — CALCIUM, IONIZED: CALCIUM, IONIZED, SERUM: 3.5 mg/dL — AB (ref 4.5–5.6)

## 2018-06-23 LAB — MAGNESIUM: MAGNESIUM: 2.7 mg/dL — AB (ref 1.7–2.4)

## 2018-06-23 MED ORDER — SODIUM CHLORIDE 0.9% IV SOLUTION: Freq: Once | INTRAVENOUS | Status: DC

## 2018-06-23 MED ORDER — INSULIN ASPART 100 UNIT/ML ~~LOC~~ SOLN
5.0000 [IU] | Freq: Four times a day (QID) | SUBCUTANEOUS | Status: DC
  Administered 2018-06-23 – 2018-06-28 (×20): 5 [IU] via SUBCUTANEOUS

## 2018-06-23 MED ORDER — SODIUM CHLORIDE 0.9% IV SOLUTION
Freq: Once | INTRAVENOUS | Status: AC
  Administered 2018-06-23: 11:00:00 via INTRAVENOUS

## 2018-06-23 NOTE — Progress Notes (Signed)
Pt hemoglobin 6.8 this AM. Cards MD on call notified. No new orders received at this time.   Will continue to monitor.  [address], [place]

## 2018-06-23 NOTE — Progress Notes (Signed)
Pt. Placed back on vent due to increase in RR. Pt. Breathing also appears labored.

## 2018-06-23 NOTE — Evaluation (Signed)
Occupational Therapy Evaluation Patient Details Name: Bryan Parker MRN: [id number] DOB: Aug 01, 1946 Today's Date: 06/23/2018    History of Present Illness Pt is a 72 y.o. M with significant PMH of HTN, DVT/PE March 2016 NOT on chronic anticoagulation, CKD and prior tobacco use. Presented after PEA arrest with 10+ minutes down and now with RV failure. Course complicated by multiorgan failure requiring CRRT and prolonged ventilation and tracheostomy on 11/2. Has new left IJ dialysis catheter on 11/4.   Clinical Impression   Pt requiring total assist for mobility and ADL. Responds to gentle PROM of shoulders and neck with grimacing. Pt with frequent blinking, difficult to assess vision. Pt will need extensive rehab upon discharge. Will follow acutely.    Follow Up Recommendations  LTACH;Supervision/Assistance - 24 hour    Equipment Recommendations       Recommendations for Other Services       Precautions / Restrictions Precautions Precautions: Fall Precaution Comments: Cortrak, watch BP, RR Restrictions Weight Bearing Restrictions: No      Mobility Bed Mobility Overal bed mobility: Needs Assistance Bed Mobility: Rolling Rolling: +2 for physical assistance;Total assist         General bed mobility comments: no attempt to roll upon command, total assist to position  Transfers                 General transfer comment: deferred    Balance                                           ADL either performed or assessed with clinical judgement   ADL                                         General ADL Comments: requires total care     Vision   Additional Comments: eyes open, but did not appear to focus or track, difficult to assess if pt responds to threat due to frequent, continuous blinking     Perception     Praxis      Pertinent Vitals/Pain Pain Assessment: Faces Faces Pain Scale: Hurts little more Pain Location:  shoulders, neck with gentle ROM Pain Descriptors / Indicators: Grimacing     Hand Dominance     Extremity/Trunk Assessment Upper Extremity Assessment Upper Extremity Assessment: RUE deficits/detail;LUE deficits/detail RUE Deficits / Details: PROM to 90 degrees before grimacing with tight endrange, full PROM elbow to hand RUE Coordination: decreased fine motor;decreased gross motor(no functional movement) LUE Deficits / Details: PROM shoulder to 80 degrees before grimacing, tight end range, full PROM elbow to hand  LUE Coordination: decreased fine motor;decreased gross motor(no functional movement)   Lower Extremity Assessment Lower Extremity Assessment: Defer to PT evaluation   Cervical / Trunk Assessment Cervical / Trunk Assessment: Other exceptions Cervical / Trunk Exceptions: Cervical lateral flexion towards left, gentle PROM and positioning in neutral   Communication Communication Communication: Tracheostomy   Cognition Arousal/Alertness: Awake/alert Behavior During Therapy: Flat affect Overall Cognitive Status: Difficult to assess Area of Impairment: Following commands                       Following Commands: (did not follow commands)       General Comments: difficult to assess if pt responds to threat, blinks  eyes constantly   General Comments       Exercises     Shoulder Instructions      Home Living Family/patient expects to be discharged to:: Unsure Living Arrangements: Spouse/significant other                                      Prior Functioning/Environment                   OT Problem List: Decreased strength;Decreased range of motion;Decreased activity tolerance;Decreased coordination;Decreased cognition;Pain;Impaired UE functional use;Cardiopulmonary status limiting activity      OT Treatment/Interventions: Self-care/ADL training;DME and/or AE instruction;Patient/family education;Therapeutic activities;Cognitive  remediation/compensation;Balance training;Therapeutic exercise    OT Goals(Current goals can be found in the care plan section) Acute Rehab OT Goals Patient Stated Goal: unable to state OT Goal Formulation: Patient unable to participate in goal setting Time For Goal Achievement: 07/07/18 Potential to Achieve Goals: Fair ADL Goals Pt Will Perform Grooming: (Pt will perform hand to mouth as a precursor to grooming.) Pt/caregiver will Perform Home Exercise Program: Increased ROM;Both right and left upper extremity(Family will be knowledgeable in PROM B UEs.) Additional ADL Goal #1: Pt will visually track target across midline 25% of time. Additional ADL Goal #2: Pt will follow commands 25% of time. Additional ADL Goal #3: Pt will roll with moderate assistance to help with positioning and ADL.  OT Frequency: Min 2X/week   Barriers to D/C:            Co-evaluation              AM-PAC PT "6 Clicks" Daily Activity     Outcome Measure Help from another person eating meals?: Total Help from another person taking care of personal grooming?: Total Help from another person toileting, which includes using toliet, bedpan, or urinal?: Total Help from another person bathing (including washing, rinsing, drying)?: Total Help from another person to put on and taking off regular upper body clothing?: Total Help from another person to put on and taking off regular lower body clothing?: Total 6 Click Score: 6   End of Session    Activity Tolerance: Patient tolerated treatment well(VSS) Patient left: in bed;with call bell/phone within reach  OT Visit Diagnosis: Pain;Other symptoms and signs involving cognitive function;Muscle weakness (generalized) (M62.81)                Time: 6578-4696 OT Time Calculation (min): 22 min Charges:  OT General Charges $OT Visit: 1 Visit OT Evaluation $OT Eval High Complexity: 1 High  Martie Round, OTR/L Acute Rehabilitation Services Pager:  [phone] Office: [phone]  Evern Bio 06/23/2018, 3:23 PM

## 2018-06-23 NOTE — Progress Notes (Signed)
ANTICOAGULATION CONSULT NOTE   Pharmacy Consult for Heparin Indication: suspected pulmonary embolus and confirmed DVT  No Known Allergies  Patient Measurements: Height: 5\' 10"  (177.8 cm) Weight: 143 lb 4.8 oz (65 kg) IBW/kg (Calculated) : 73  Vital Signs: Temp: 98.8 F (37.1 C) (11/07 1530) Temp Source: Axillary (11/07 1530) BP: 133/41 (11/07 1800) Pulse Rate: 109 (11/07 1800)  Labs: Recent Labs    06/21/18 0400  06/22/18 0410  06/22/18 1530 06/23/18 0030 06/23/18 0517 06/23/18 0913 06/23/18 1507 06/23/18 1820  HGB 6.8*  --  7.6*  --   --   --  6.8*  --   --   --   HCT 20.4*  --  23.8*  --   --   --  21.7*  --   --   --   PLT 523*  --  521*  --   --   --  484*  --   --   --   HEPARINUNFRC 0.75*   < >  --    < > 0.17* 0.21*  --  0.20*  --  0.35  CREATININE 2.07*   < > 1.66*  --  2.20*  --  3.34*  --  4.11*  --    < > = values in this interval not displayed.    Estimated Creatinine Clearance: 15.2 mL/min (A) (by C-G formula based on SCr of 4.11 mg/dL (H)).  Assessment: 72yo male with DVT and likely PE s/p PEA arrest and TNKase 10/20 for heparin. Currently on CRRT. Trach site bleeding now resolved and heparin was restarted.  Heparin level now therapeutic at 0.35, no changes indicated.  Goal of Therapy:  Heparin level 0.3-0.5 units/ml Monitor platelets by anticoagulation protocol: Yes   Plan:  Continue heparin 1650 units/hr Daily heparin level and CBC  Fredonia Highland, PharmD, BCPS Clinical Pharmacist [phone] Please check AMION for all [facility] Pharmacy numbers 06/23/2018

## 2018-06-23 NOTE — Progress Notes (Signed)
ANTICOAGULATION CONSULT NOTE   Pharmacy Consult for Heparin Indication: suspected pulmonary embolus and confirmed DVT  No Known Allergies  Patient Measurements: Height: 5\' 10"  (177.8 cm) Weight: 143 lb 4.8 oz (65 kg) IBW/kg (Calculated) : 73  Vital Signs: Temp: 101.2 F (38.4 C) (11/07 0752) Temp Source: Axillary (11/07 0752) BP: 80/48 (11/07 0900) Pulse Rate: 126 (11/07 0915)  Labs: Recent Labs    06/21/18 0400  06/22/18 0410  06/22/18 1530 06/23/18 0030 06/23/18 0517 06/23/18 0913  HGB 6.8*  --  7.6*  --   --   --  6.8*  --   HCT 20.4*  --  23.8*  --   --   --  21.7*  --   PLT 523*  --  521*  --   --   --  484*  --   HEPARINUNFRC 0.75*   < >  --    < > 0.17* 0.21*  --  0.20*  CREATININE 2.07*   < > 1.66*  --  2.20*  --  3.34*  --    < > = values in this interval not displayed.    Estimated Creatinine Clearance: 18.7 mL/min (A) (by C-G formula based on SCr of 3.34 mg/dL (H)).  Assessment: 72yo male with DVT and likely PE s/p PEA arrest and TNKase 10/20 for heparin. Currently on CRRT. Trach site bleeding now resolved and heparin was restarted.  AM heparin level remains below goal and slightly decreased from previous at 0.21 despite infusion rate increase. No overt bleeding or complications noted. Hgb 6.8 and patient to get 1 unit pRBC.  Goal of Therapy:  Heparin level 0.3-0.5 units/ml Monitor platelets by anticoagulation protocol: Yes   Plan:  Increase heparin to 1650 units/hr Heparin level in ~8 hours Daily heparin level and CBC  Arvilla Market, PharmD PGY1 Pharmacy Resident Phone [phone] 06/23/2018     10:21 AM

## 2018-06-23 NOTE — Progress Notes (Signed)
Alva MD ordered to take out arterial line. Notified him that cuff and line were about 30 points different. SBP per the cuff of 85 or greater is acceptable. Arterial line discontinued and RN will monitor cuff BP.

## 2018-06-23 NOTE — Progress Notes (Addendum)
[facility] NEPHROLOGY PROGRESS NOTE  Assessment/ Plan: Pt is a 72 y.o. yo male with CKD (b/l cr 1.5-2 in 2016, on admission 2.5), history of PE presented with PEA arrest with hemodynamic instability and worsening renal failure.  #Acute kidney injury on CKD likely ATN in the setting of prolonged hypotension requiring pressors.  Do not think rhabdomyolysis causing renal failure.  -Restarted CRRT from 11/3-11/6 morning.  Discontinued CRRT yesterday.  Patient is nonoliguric.  Volume status and electrolytes acceptable.  He is still hypotensive requiring pressors.  I will hold off on dialysis today and reassess tomorrow morning for dialysis need.  -He has no Foley catheter, continue bladder scan.  Discussed with the nurse. -Bladder scan with no urine.  CT scan abdomen pelvis showed no hydronephrosis on 06/15/18.  -The temporary dialysis catheter was changed to L IJ on 06/20/2018.  #Rhabdomyolysis due to trauma/code: CK level is trending down.  #Hypotension/shock: Patient is on pressors.  Blood pressure borderline low.    #Anemia multifactorial etiology including bleeding from the tracheostomy site: Hemoglobin 6.8 today.  Plan for blood transfusion per primary team.  On Aranesp.  # Massive PE and PEA arrest: History of PE, currently on anticoagulation.  #Sepsis on Vanco and Zosyn.  Patient is afebrile.  Discussed with ICU team.  Subjective: Seen and examined at bedside.  Opens eyes but not really following commands.  Patient is febrile today.  Wife at bedside.  On trach. Objective Vital signs in last 24 hours: Vitals:   06/23/18 0500 06/23/18 0600 06/23/18 0700 06/23/18 0752  BP:  (!) 81/53 (!) 80/41   Pulse:  (!) 114 (!) 124   Resp: (!) 26 (!) 22 (!) 28   Temp:    (!) 101.2 F (38.4 C)  TempSrc:    Axillary  SpO2:  100% 100%   Weight: 65 kg     Height:       Weight change: 3.8 kg  Intake/Output Summary (Last 24 hours) at 06/23/2018 0924 Last data filed at 06/23/2018  0700 Gross per 24 hour  Intake 2376.46 ml  Output 96 ml  Net 2280.46 ml       Labs: Basic Metabolic Panel: Recent Labs  Lab 06/22/18 0410 06/22/18 1530 06/23/18 0517  NA 134* 134* 135  K 4.8 4.3 4.7  CL 100 99 101  CO2 26 26 21*  GLUCOSE 144* 143* 160*  BUN 25* 30* 47*  CREATININE 1.66* 2.20* 3.34*  CALCIUM 8.2* 8.3* 8.2*  PHOS 2.4* 2.5 2.7   Liver Function Tests: Recent Labs  Lab 06/22/18 0410 06/22/18 1530 06/23/18 0517  ALBUMIN 1.6* 1.6* 1.5*   No results for input(s): LIPASE, AMYLASE in the last 168 hours. No results for input(s): AMMONIA in the last 168 hours. CBC: Recent Labs  Lab 06/19/18 2211  06/20/18 0426  06/21/18 0400 06/22/18 0410 06/23/18 0517  WBC 18.9*  --  18.3*  --  21.0* 18.0* 19.2*  HGB 8.0*   < > 7.9*   < > 6.8* 7.6* 6.8*  HCT 23.2*   < > 23.1*   < > 20.4* 23.8* 21.7*  MCV 88.5  --  89.2  --  87.6 89.8 93.1  PLT 512*  --  557*  --  523* 521* 484*   < > = values in this interval not displayed.   Cardiac Enzymes: Recent Labs  Lab 06/19/18 0419  CKTOTAL 2,847*   CBG: Recent Labs  Lab 06/22/18 1530 06/22/18 2003 06/23/18 0008 06/23/18 0418 06/23/18 2409  GLUCAP 140* 163* 189* 146* 146*    Iron Studies:  Recent Labs    06/21/18 0400  IRON 50  TIBC 200*  FERRITIN 1,101*   Studies/Results: No results found.  Medications: Infusions: . sodium chloride 1,000 mL (06/19/18 1701)  . sodium chloride 10 mL/hr at 06/23/18 0700  . sodium chloride    . feeding supplement (VITAL AF 1.2 CAL) 1,000 mL (06/22/18 2236)  . fentaNYL infusion INTRAVENOUS 200 mcg/hr (06/23/18 0700)  . heparin 1,550 Units/hr (06/23/18 0700)  . norepinephrine (LEVOPHED) Adult infusion 5 mcg/min (06/23/18 0718)  . piperacillin-tazobactam (ZOSYN)  IV 3.375 g (06/23/18 0030)  . sodium chloride    . sodium chloride      Scheduled Medications: . sodium chloride   Intravenous Once  . sodium chloride   Intravenous Once  . chlorhexidine gluconate  (MEDLINE KIT)  15 mL Mouth Rinse BID  . Chlorhexidine Gluconate Cloth  6 each Topical Q0600  . clonazepam  1 mg Per Tube BID  . darbepoetin (ARANESP) injection - NON-DIALYSIS  200 mcg Subcutaneous Q Sun-1800  . hydrALAZINE  25 mg Oral Q8H  . insulin aspart  0-9 Units Subcutaneous Q4H  . insulin aspart  5 Units Subcutaneous Q6H  . mouth rinse  15 mL Mouth Rinse 10 times per day  . metoCLOPramide (REGLAN) injection  5 mg Intravenous Q6H  . pantoprazole sodium  40 mg Per Tube Q1200  . QUEtiapine  200 mg Oral BID  . sodium chloride flush  10-40 mL Intracatheter Q12H    have reviewed scheduled and prn medications.  Physical Exam: General: On trach collar, opening eyes but not really following commands Heart:RRR, s1s2 nl Lungs: Clear bilateral anteriorly, no wheezing Abdomen:soft, Non-tender, non-distended Extremities: No edema. Dialysis Access: Left IJ temporary catheter exchanged on 06/20/2018.  Cayla Wiegand Prasad Libbi Towner 06/23/2018,9:24 AM  LOS: 18 days

## 2018-06-23 NOTE — Progress Notes (Signed)
72 year old man admitted 10/20 post cardiac arrest with PEA received TNKase in the EDfor presumed PEdue to bedside echo showing dilated RV. He has a history of prior PE and anticoagulation was discontinued in 2018. Duplex showed left popliteal DVT He wasin severe cor pulmonale on high-dose epinephrine, Levophed, vasopressin and dobutamine.  Course complicated by multiorgan failure requiringCRRT and prolonged ventilationrequiring tracheostomy on 11/2.  CT chest abdomen pelvis did not show any evidence of pneumonia or internal bleeding. Head CT  was negative without any evidence of stroke.  He tolerated trach collar in the daytime on 11/5 and 11/6, back around 3 PM. He remains on low-dose Levophed, required fentanyl drip again for agitation. On exam-chronically ill-appearing, eyes open but does not follow commands today, Cheyne-Stokes breathing, trach site appears clean without bleeding, S1-S2 tacky, skin and toes continue to show signs of ischemia, no worse.  Labs show drop in hemoglobin to 6.8 again, leukocytosis stable at 19  Impression/plan  Acute respiratory failure-continue attempts at trach collar in the daytime and 24 hours if he tolerates. He has Cheyne-Stokes breathing and will tolerate higher respiratory rate.  New fever/leukocytosis with copious secretions-await respiratory culture, treat as it CAP with Zosyn/vanc  Acute encephalopathy-use intermittent fentanyl, continue Seroquel and clonazepam at current doses. Avoid Precedex and propofol since he does hypotension with these.  Massive PE/cardiac arrest-  continue heparin IV  AKI-CRRT has been stopped, creatinine improving, will need permacath eventually once WBC count decreases. Transfuse 1 unit PRBC for anemia of chronic illness  He will likely need PEG tube to unless he improves consistently. Wife updated on a daily basis  My independent critical care time x 28m  Cyril Mourning MD. FCCP. [place] [facility] Pager [phone] If no response call [phone]   06/23/2018

## 2018-06-23 NOTE — Progress Notes (Signed)
ANTICOAGULATION CONSULT NOTE   Pharmacy Consult for Heparin Indication: suspected pulmonary embolus and confirmed DVT  No Known Allergies  Patient Measurements: Height: 5\' 10"  (177.8 cm) Weight: 134 lb 14.7 oz (61.2 kg) IBW/kg (Calculated) : 73  Vital Signs: Temp: 99.6 F (37.6 C) (11/07 0000) Temp Source: Oral (11/07 0000) BP: 110/62 (11/06 2300) Pulse Rate: 112 (11/06 2304)  Labs: Recent Labs    06/20/18 0426  06/20/18 1559  06/21/18 0400  06/21/18 1524  06/22/18 0410 06/22/18 0500 06/22/18 1530 06/23/18 0030  HGB 7.9*   < > 9.2*  --  6.8*  --   --   --  7.6*  --   --   --   HCT 23.1*   < > 27.0*  --  20.4*  --   --   --  23.8*  --   --   --   PLT 557*  --   --   --  523*  --   --   --  521*  --   --   --   HEPARINUNFRC  --   --   --    < > 0.75*   < >  --    < >  --  0.25* 0.17* 0.21*  CREATININE 3.07*   < > 2.10*  --  2.07*  --  1.80*  --  1.66*  --  2.20*  --    < > = values in this interval not displayed.    Estimated Creatinine Clearance: 26.7 mL/min (A) (by C-G formula based on SCr of 2.2 mg/dL (H)).  Assessment: 72yo male with DVT and likely PE s/p PEA arrest and TNKase 10/20 for heparin. Currently on CRRT. Trach site bleeding now resolved and heparin was restarted.  AM heparin level below goal at 0.25 and heparin gtt rate was increased.  Repeat heparin level now even lower despite rate increase.  Spoke with RN, no known issues with IV, heparin running peripherally and levels being drawn from A line.  No overt bleeding or complications noted.  11/7 AM update: heparin level remains low, no issues per RN.   Goal of Therapy:  Heparin level 0.3-0.5 units/ml Monitor platelets by anticoagulation protocol: Yes   Plan:  Increase heparin to 1550 units/hr Heparin level in ~8 hours Daily heparin level and CBC  Abran Duke, PharmD, BCPS Clinical Pharmacist Phone: [phone]

## 2018-06-24 ENCOUNTER — Inpatient Hospital Stay (HOSPITAL_COMMUNITY): Payer: Medicare Other

## 2018-06-24 LAB — TYPE AND SCREEN
ABO/RH(D): A POS
ANTIBODY SCREEN: NEGATIVE
UNIT DIVISION: 0
Unit division: 0

## 2018-06-24 LAB — CBC
HCT: 25.3 % — ABNORMAL LOW (ref 39.0–52.0)
HEMOGLOBIN: 8 g/dL — AB (ref 13.0–17.0)
MCH: 28.9 pg (ref 26.0–34.0)
MCHC: 31.6 g/dL (ref 30.0–36.0)
MCV: 91.3 fL (ref 80.0–100.0)
PLATELETS: 539 10*3/uL — AB (ref 150–400)
RBC: 2.77 MIL/uL — AB (ref 4.22–5.81)
RDW: 20 % — ABNORMAL HIGH (ref 11.5–15.5)
WBC: 21.2 10*3/uL — AB (ref 4.0–10.5)
nRBC: 6.8 % — ABNORMAL HIGH (ref 0.0–0.2)

## 2018-06-24 LAB — RENAL FUNCTION PANEL
ANION GAP: 14 (ref 5–15)
Albumin: 1.6 g/dL — ABNORMAL LOW (ref 3.5–5.0)
Albumin: 1.5 g/dL — ABNORMAL LOW (ref 3.5–5.0)
Anion gap: 16 — ABNORMAL HIGH (ref 5–15)
BUN: 66 mg/dL — ABNORMAL HIGH (ref 8–23)
BUN: 75 mg/dL — ABNORMAL HIGH (ref 8–23)
CALCIUM: 8.4 mg/dL — AB (ref 8.9–10.3)
CO2: 19 mmol/L — AB (ref 22–32)
CO2: 20 mmol/L — ABNORMAL LOW (ref 22–32)
CREATININE: 4.84 mg/dL — AB (ref 0.61–1.24)
Calcium: 8.4 mg/dL — ABNORMAL LOW (ref 8.9–10.3)
Chloride: 100 mmol/L (ref 98–111)
Chloride: 101 mmol/L (ref 98–111)
Creatinine, Ser: 5.57 mg/dL — ABNORMAL HIGH (ref 0.61–1.24)
GFR calc Af Amer: 11 mL/min — ABNORMAL LOW (ref >60)
GFR calc non Af Amer: 11 mL/min — ABNORMAL LOW (ref >60)
GFR calc non Af Amer: 9 mL/min — ABNORMAL LOW (ref >60)
GFR, EST AFRICAN AMERICAN: 13 mL/min — AB (ref >60)
GLUCOSE: 113 mg/dL — AB (ref 70–99)
Glucose, Bld: 129 mg/dL — ABNORMAL HIGH (ref 70–99)
PHOSPHORUS: 3.5 mg/dL (ref 2.5–4.6)
POTASSIUM: 5.2 mmol/L — AB (ref 3.5–5.1)
Phosphorus: 3.3 mg/dL (ref 2.5–4.6)
Potassium: 4.9 mmol/L (ref 3.5–5.1)
SODIUM: 135 mmol/L (ref 135–145)
SODIUM: 135 mmol/L (ref 135–145)

## 2018-06-24 LAB — BPAM RBC
BLOOD PRODUCT EXPIRATION DATE: 201911122359
BLOOD PRODUCT EXPIRATION DATE: 201911292359
ISSUE DATE / TIME: 201911050800
ISSUE DATE / TIME: 201911071112
Unit Type and Rh: 600
Unit Type and Rh: 6200

## 2018-06-24 LAB — GLUCOSE, CAPILLARY
GLUCOSE-CAPILLARY: 110 mg/dL — AB (ref 70–99)
Glucose-Capillary: 107 mg/dL — ABNORMAL HIGH (ref 70–99)
Glucose-Capillary: 125 mg/dL — ABNORMAL HIGH (ref 70–99)
Glucose-Capillary: 108 mg/dL — ABNORMAL HIGH (ref 70–99)

## 2018-06-24 LAB — CALCIUM, IONIZED: CALCIUM, IONIZED, SERUM: 4.3 mg/dL — AB (ref 4.5–5.6)

## 2018-06-24 LAB — MAGNESIUM: MAGNESIUM: 2.5 mg/dL — AB (ref 1.7–2.4)

## 2018-06-24 LAB — HEPARIN LEVEL (UNFRACTIONATED): Heparin Unfractionated: 0.36 IU/mL (ref 0.30–0.70)

## 2018-06-24 MED ORDER — HYDROCORTISONE 1 % EX CREA
TOPICAL_CREAM | Freq: Three times a day (TID) | CUTANEOUS | Status: DC
  Filled 2018-06-24: qty 28

## 2018-06-24 MED ORDER — VANCOMYCIN HCL 10 G IV SOLR
1500.0000 mg | Freq: Once | INTRAVENOUS | Status: AC
  Administered 2018-06-24: 1500 mg via INTRAVENOUS
  Filled 2018-06-24: qty 1500

## 2018-06-24 MED ORDER — VANCOMYCIN HCL 10 G IV SOLR
1500.0000 mg | Freq: Once | INTRAVENOUS | Status: DC
  Filled 2018-06-24: qty 1500

## 2018-06-24 MED ORDER — VANCOMYCIN VARIABLE DOSE PER UNSTABLE RENAL FUNCTION (PHARMACIST DOSING): Status: DC

## 2018-06-24 MED ORDER — HEPARIN (PORCINE) 25000 UT/250ML-% IV SOLN
1300.0000 [IU]/h | INTRAVENOUS | Status: DC
  Administered 2018-06-24 – 2018-06-25 (×3): 1650 [IU]/h via INTRAVENOUS
  Administered 2018-06-26: 1550 [IU]/h via INTRAVENOUS
  Administered 2018-06-27 – 2018-06-28 (×2): 1400 [IU]/h via INTRAVENOUS
  Filled 2018-06-24 (×6): qty 250

## 2018-06-24 MED ORDER — MIDODRINE HCL 5 MG PO TABS
10.0000 mg | ORAL_TABLET | Freq: Three times a day (TID) | ORAL | Status: DC
  Administered 2018-06-24 – 2018-06-25 (×4): 10 mg via ORAL
  Filled 2018-06-24 (×5): qty 2

## 2018-06-24 NOTE — Progress Notes (Signed)
72 year old man admitted 10/20 post cardiac arrest with PEA received TNKase in the EDfor presumed PEdue to bedside echo showing dilated RV. He has a history of prior PE and anticoagulation was discontinued in 2018. Duplex showed left popliteal DVT He wasin severe cor pulmonale on high-dose epinephrine, Levophed, vasopressin and dobutamine.  Course complicated by multiorgan failure requiringCRRT and prolonged ventilationrequiring tracheostomy on 11/2.  He had high-grade fever to 103, copious secretions through tracheostomy although slightly decreased compared to 2 days ago. He tolerated more than 12 hours of trach collar yesterday placed back on vent on 11 PM.  Off levo fed, remains on low-dose fentanyl for agitation.  On exam-chronic critically ill, eyes open but does not follow commands, trach site without bleeding, S1-S2 tacky, soft and nontender abdomen, feet and toes show signs of ischemia.  Labs show improved hemoglobin to 8.0 after 1 unit PRBC transfusion and increasing leukocytosis to 21, creatinine and BUN rising, electrolytes otherwise normal.  Impression/plan New fever/leukocytosis -most likely due to HCAP, await respiratory culture and will repeat, obtain chest x-ray today, continue Zosyn but add vancomycin.  Also obtain blood cultures. HD catheter is new and permacath will have to wait until leukocytosis improves.  Acute respiratory failure/tracheostomy-tolerating trach collar in the daytime and will trial 24 hours as he tolerates.  He does have Cheyne-Stokes breathing  Acute encephalopathy-we will try to discontinue fentanyl drip and go to intermittent fentanyl, continue with Seroquel and clonazepam at current doses Avoid propofol and Precedex in this case  AKI -no emergent indication for dialysis, he will require intermittent dialysis if blood pressure tolerates  Massive PE/cardiac arrest -continue heparin IV Disposition pending is whether he will need PEG tube or  not  Wife updated daily  My critical care time x 42m  Cyril Mourning MD. Tonny Bollman. [place] [facility] Pager [phone] If no response call [phone]   06/24/2018

## 2018-06-24 NOTE — Progress Notes (Signed)
Physical Therapy Treatment Patient Details Name: Bryan Parker MRN: [id number] DOB: May 26, 1946 Today's Date: 06/24/2018    History of Present Illness Pt is a 72 y.o. M with significant PMH of HTN, DVT/PE March 2016 NOT on chronic anticoagulation, CKD and prior tobacco use. Presented after PEA arrest with 10+ minutes down and now with RV failure. Course complicated by multiorgan failure requiring CRRT and prolonged ventilation and tracheostomy on 11/2. Has new left IJ dialysis catheter on 11/4.    PT Comments    Patient not making progress towards his physical therapy goals and did not follow any simple commands during session today. Does not seem to be visually tracking therapist. Requiring two person total assistance for all aspects of bed mobility. Focused on PROM and gentle stretching of ankle dorsiflexors, left elbow, and cervical lateral flexors. At end of session, repositioned to promote neutral cervical alignment and donned PRAFO's.     Follow Up Recommendations  SNF;Supervision/Assistance - 24 hour (noted LTACH denial)     Equipment Recommendations  Other (comment)(TBA)    Recommendations for Other Services       Precautions / Restrictions Precautions Precautions: Fall Precaution Comments: Cortrak, watch BP, RR Restrictions Weight Bearing Restrictions: No    Mobility  Bed Mobility Overal bed mobility: Needs Assistance Bed Mobility: Supine to Sit;Sit to Supine     Supine to sit: Total assist;+2 for physical assistance Sit to supine: Total assist;+2 for physical assistance   General bed mobility comments: no evidence of effort  Transfers                 General transfer comment: deferred  Ambulation/Gait                 Stairs             Wheelchair Mobility    Modified Rankin (Stroke Patients Only)       Balance Overall balance assessment: Needs assistance Sitting-balance support: Bilateral upper extremity supported;Feet  supported Sitting balance-Leahy Scale: Poor Sitting balance - Comments: requiring min-moderate assistance. decreased balance reactions noted                                    Cognition Arousal/Alertness: Awake/alert Behavior During Therapy: Flat affect Overall Cognitive Status: Difficult to assess Area of Impairment: Following commands                       Following Commands: (did not follow commands)       General Comments: difficult to assess if pt responds to threat, blinks eyes constantly      Exercises Other Exercises Other Exercises: Dynamic/static sitting balance including lateral leans onto right and left elbow with max assist back to upright Other Exercises: Seated: PROM/stretching of bilateral ankle dorsiflexors, left elbow extension Other Exercises: Supine: gentle stretch to achieve neutral cervical alignment    General Comments        Pertinent Vitals/Pain Pain Assessment: Faces Faces Pain Scale: Hurts little more Pain Location: shoulders, neck with gentle ROM Pain Descriptors / Indicators: Grimacing Pain Intervention(s): Monitored during session;Repositioned  Vitals: 117 HR, 100% SpO2 via 35% trach collar, 117/101, RR 40s    Home Living                      Prior Function            PT Goals (current  goals can now be found in the care plan section) Acute Rehab PT Goals Patient Stated Goal: unable to state Potential to Achieve Goals: Fair Progress towards PT goals: Not progressing toward goals - comment(no change in status)    Frequency    Min 2X/week      PT Plan Current plan remains appropriate    Co-evaluation              AM-PAC PT "6 Clicks" Daily Activity  Outcome Measure  Difficulty turning over in bed (including adjusting bedclothes, sheets and blankets)?: Unable Difficulty moving from lying on back to sitting on the side of the bed? : Unable Difficulty sitting down on and standing up from a  chair with arms (e.g., wheelchair, bedside commode, etc,.)?: Unable Help needed moving to and from a bed to chair (including a wheelchair)?: Total Help needed walking in hospital room?: Total Help needed climbing 3-5 steps with a railing? : Total 6 Click Score: 6    End of Session Equipment Utilized During Treatment: Oxygen Activity Tolerance: Patient tolerated treatment well Patient left: in bed;with call bell/phone within reach;with restraints reapplied Nurse Communication: Mobility status PT Visit Diagnosis: Other abnormalities of gait and mobility (R26.89);Muscle weakness (generalized) (M62.81)     Time: 1610-9604 PT Time Calculation (min) (ACUTE ONLY): 21 min  Charges:  $Therapeutic Activity: 8-22 mins                    Laurina Bustle, PT, DPT Acute Rehabilitation Services Pager [phone] Office [phone]    Vanetta Mulders 06/24/2018, 1:59 PM

## 2018-06-24 NOTE — Progress Notes (Signed)
ANTICOAGULATION CONSULT NOTE   Pharmacy Consult for Heparin Indication: suspected pulmonary embolus and confirmed DVT  No Known Allergies  Patient Measurements: Height: 5\' 10"  (177.8 cm) Weight: 144 lb 2.9 oz (65.4 kg) IBW/kg (Calculated) : 73  Vital Signs: Temp: 98.9 F (37.2 C) (11/08 0400) Temp Source: Oral (11/08 0400) BP: 104/69 (11/08 0600) Pulse Rate: 120 (11/08 0804)  Labs: Recent Labs    06/22/18 0410  06/23/18 0517 06/23/18 0913 06/23/18 1507 06/23/18 1820 06/24/18 0243  HGB 7.6*  --  6.8*  --   --   --  8.0*  HCT 23.8*  --  21.7*  --   --   --  25.3*  PLT 521*  --  484*  --   --   --  539*  HEPARINUNFRC  --    < >  --  0.20*  --  0.35 0.36  CREATININE 1.66*   < > 3.34*  --  4.11*  --  4.84*   < > = values in this interval not displayed.    Estimated Creatinine Clearance: 12.9 mL/min (A) (by C-G formula based on SCr of 4.84 mg/dL (H)).  Assessment: 72yo male with DVT and likely PE s/p PEA arrest and TNKase 10/20 for heparin. Currently off CRRT. Trach site bleeding now resolved and heparin was restarted.  Heparin level therapeutic at 0.36; no bleeding or infusion issues noted. No changes indicated.  Goal of Therapy:  Heparin level 0.3-0.5 units/ml Monitor platelets by anticoagulation protocol: Yes   Plan:  Continue heparin 1650 units/hr Daily heparin level and CBC Monitor for signs and symptoms of bleeding  Arvilla Market, PharmD PGY1 Pharmacy Resident Phone [phone] 06/24/2018     8:07 AM

## 2018-06-24 NOTE — Progress Notes (Addendum)
Pharmacy Antibiotic Note  Bryan Parker is a 72 y.o. male admitted on 06/25/2018 with possible aspiration pna/sepsis. Pharmacy has been consulted for Zosyn dosing. Afebrile and WBC up to 21.2. All cultures negative. Continues off CRRT. Resent blood and TA cultures today.    Plan: -Restart vancomycin 1500 mg IV x1  -Consider level tomorrow vs Sunday pending SCr -Continue zosyn 3.375 g IV q12h for decreased renal function off CRRT  -Will continue to monitor renal function/HD plans, clinical improvement, and length of therapy   Height: 5\' 10"  (177.8 cm) Weight: 144 lb 2.9 oz (65.4 kg) IBW/kg (Calculated) : 73  Temp (24hrs), Avg:99.6 F (37.6 C), Min:98.1 F (36.7 C), Max:103 F (39.4 C)  Recent Labs  Lab 06/20/18 0426  06/20/18 1549  06/21/18 0400  06/22/18 0410 06/22/18 1530 06/23/18 0517 06/23/18 1507 06/24/18 0243  WBC 18.3*  --   --   --  21.0*  --  18.0*  --  19.2*  --  21.2*  CREATININE 3.07*   < > 2.21*   < > 2.07*   < > 1.66* 2.20* 3.34* 4.11* 4.84*  VANCOTROUGH  --   --  26*  --   --   --   --   --   --   --   --    < > = values in this interval not displayed.    Estimated Creatinine Clearance: 12.9 mL/min (A) (by C-G formula based on SCr of 4.84 mg/dL (H)).    No Known Allergies  Dose adjustments this admission: 10/23 VR = 22 - vanc 750 mg q24h, pt started on CRRT (zosyn adjusted for CRRT) 10/25 VR = 36 - drawn after vancomycin was given 11/4 VT = 26 - dose already given prior to result, dec to 500 q24   Antimicrobials:  Zosyn 10/20>>10/31; 11/3 >>  Vancomycin 10/20 >> 10/25; 10/29>>11/6; 11/8>>  Cultures: 11/8 BCx: * 11/8 TA: * 11/6 TA: * 11/3: TA: few candida no organisms seen 10/29 BCx: Negative 10/29 TA: few candida, rare GPC  10/20 BCx: Negative 10/20 TA: normal flora 10/20 MRSA PCR: negative   Thank you for involving pharmacy in this patient's care.  Marcelino Freestone, PharmD PGY2 Cardiology Pharmacy Resident Phone [phone] Please  check AMION for all Pharmacist numbers by unit 06/24/2018 9:47 AM

## 2018-06-24 NOTE — Progress Notes (Signed)
50 mL fentanyl wasted down sink with Film/video editor.

## 2018-06-24 NOTE — Progress Notes (Signed)
[facility] NEPHROLOGY PROGRESS NOTE  Assessment/ Plan: Pt is a 72 y.o. yo male with CKD (b/l cr 1.5-2 in 2016, on admission 2.5), history of PE presented with PEA arrest with hemodynamic instability and worsening renal failure.  #Acute kidney injury on CKD likely ATN in the setting of prolonged hypotension requiring pressors.  Do not think rhabdomyolysis causing renal failure.  -Restarted CRRT from 11/3-11/6 morning.  Patient had temperature of 103 with a borderline low blood pressure.  Last night he was back on vent and currently on trach collar.  I ordered chest x-ray.  He remains anuric.  Potassium level acceptable.  I started midodrine for hypotension.  Hold off on dialysis today.  Reassess in the morning for either intermittent hemodialysis versus CRRT.   -He has no Foley catheter, continue bladder scan.  Discussed with the nurse.  CT scan abdomen pelvis showed no hydronephrosis on 06/15/18.  -The temporary dialysis catheter was changed to L IJ on 06/20/2018.  #Rhabdomyolysis due to trauma/code: CK level is trending down.  #Hypotension/shock: Patient is on pressors.  Blood pressure borderline low.  Started oral midodrine.  #Anemia multifactorial etiology including bleeding from the tracheostomy site: Hemoglobin 8, received blood transfusion.  On Aranesp.  # Massive PE and PEA arrest: History of PE, currently on anticoagulation.  #Sepsis.  Patient is febrile, discussed with ICU team.   Subjective: Seen and examined at bedside.  Overnight event noted.  He was on vent briefly and currently on trach collar.  Opening eyes but not following commands.  Objective Vital signs in last 24 hours: Vitals:   06/24/18 0600 06/24/18 0800 06/24/18 0804 06/24/18 0830  BP: 104/69 111/65  100/64  Pulse: (!) 118 (!) 112 (!) 120 (!) 118  Resp: (!) 28 (!) 29 (!) 31 (!) 39  Temp:  (!) 103 F (39.4 C)    TempSrc:  Oral    SpO2: 100% 100% 100% 100%  Weight:      Height:       Weight  change: 0.4 kg  Intake/Output Summary (Last 24 hours) at 06/24/2018 0900 Last data filed at 06/24/2018 0600 Gross per 24 hour  Intake 2403.82 ml  Output 200 ml  Net 2203.82 ml       Labs: Basic Metabolic Panel: Recent Labs  Lab 06/23/18 0517 06/23/18 1507 06/24/18 0243  NA 135 134* 135  K 4.7 4.8 4.9  CL 101 100 100  CO2 21* 22 19*  GLUCOSE 160* 144* 129*  BUN 47* 56* 66*  CREATININE 3.34* 4.11* 4.84*  CALCIUM 8.2* 8.3* 8.4*  PHOS 2.7 3.1 3.3   Liver Function Tests: Recent Labs  Lab 06/23/18 0517 06/23/18 1507 06/24/18 0243  ALBUMIN 1.5* 1.6* 1.6*   No results for input(s): LIPASE, AMYLASE in the last 168 hours. No results for input(s): AMMONIA in the last 168 hours. CBC: Recent Labs  Lab 06/20/18 0426  06/21/18 0400 06/22/18 0410 06/23/18 0517 06/24/18 0243  WBC 18.3*  --  21.0* 18.0* 19.2* 21.2*  HGB 7.9*   < > 6.8* 7.6* 6.8* 8.0*  HCT 23.1*   < > 20.4* 23.8* 21.7* 25.3*  MCV 89.2  --  87.6 89.8 93.1 91.3  PLT 557*  --  523* 521* 484* 539*   < > = values in this interval not displayed.   Cardiac Enzymes: Recent Labs  Lab 06/19/18 0419  CKTOTAL 2,847*   CBG: Recent Labs  Lab 06/23/18 1507 06/23/18 1935 06/23/18 2321 06/24/18 0356 06/24/18 0746  GLUCAP  140* 114* 96 107* 125*    Iron Studies:  No results for input(s): IRON, TIBC, TRANSFERRIN, FERRITIN in the last 72 hours. Studies/Results: No results found.  Medications: Infusions: . sodium chloride 1,000 mL (06/19/18 1701)  . sodium chloride Stopped (06/23/18 2347)  . sodium chloride    . feeding supplement (VITAL AF 1.2 CAL) 1,000 mL (06/24/18 0344)  . fentaNYL infusion INTRAVENOUS 50 mcg/hr (06/24/18 0600)  . heparin 1,650 Units/hr (06/24/18 0600)  . norepinephrine (LEVOPHED) Adult infusion Stopped (06/23/18 1000)  . piperacillin-tazobactam (ZOSYN)  IV Stopped (06/24/18 0348)  . sodium chloride    . sodium chloride      Scheduled Medications: . sodium chloride   Intravenous  Once  . chlorhexidine gluconate (MEDLINE KIT)  15 mL Mouth Rinse BID  . Chlorhexidine Gluconate Cloth  6 each Topical Q0600  . clonazepam  1 mg Per Tube BID  . darbepoetin (ARANESP) injection - NON-DIALYSIS  200 mcg Subcutaneous Q Sun-1800  . hydrALAZINE  25 mg Oral Q8H  . insulin aspart  0-9 Units Subcutaneous Q4H  . insulin aspart  5 Units Subcutaneous Q6H  . mouth rinse  15 mL Mouth Rinse 10 times per day  . metoCLOPramide (REGLAN) injection  5 mg Intravenous Q6H  . midodrine  10 mg Oral TID WC  . pantoprazole sodium  40 mg Per Tube Q1200  . QUEtiapine  200 mg Oral BID  . sodium chloride flush  10-40 mL Intracatheter Q12H    have reviewed scheduled and prn medications.  Physical Exam: General: On trach collar, opening eyes but not really following commands Heart:RRR, s1s2 nl Lungs: Coarse breath sound bilateral, no wheezing Abdomen:soft, Non-tender, non-distended Extremities: No edema. Dialysis Access: Left IJ temporary catheter exchanged on 06/20/2018.  Bryan Parker Prasad Seleena Reimers 06/24/2018,9:00 AM  LOS: 19 days

## 2018-06-24 NOTE — NC FL2 (Signed)
[place] LEVEL OF CARE SCREENING TOOL     IDENTIFICATION  Patient Name: Bryan Parker Birthdate: 1946/04/16 Sex: male Admission Date (Current Location): 06/14/2018  [facility] and [place] Number:  Herbalist and Address:  [facility], [place] [address], [place], [place] [address]      Provider Number: [id number]  Attending Physician Name and Address:  Rigoberto Noel, MD  Relative Name and Phone Number:       Current Level of Care: Hospital Recommended Level of Care: [place] Prior Approval Number:    Date Approved/Denied:   PASRR Number: [id number]  Discharge Plan: SNF    Current Diagnoses: Patient Active Problem List   Diagnosis Date Noted  . Acute encephalopathy   . Tracheostomy in place [facility])   . AKI (acute kidney injury) ([place])   . Pressure injury of skin 06/13/2018  . Acute renal failure (ARF) ([place]) 06/06/2018  . RVF (right ventricular failure) ([place]) 06/06/2018  . Acute respiratory failure ([place])   . Cardiac arrest ([place]) 05/30/2018  . CKD (chronic kidney disease) 10/23/2014  . Acute deep vein thrombosis (DVT) of popliteal vein of right lower extremity ([place]) 10/22/2014  . HCAP (healthcare-associated pneumonia)   . Chest pain 10/16/2014  . URI (upper respiratory infection) 10/16/2014  . Tachycardia 10/16/2014  . Elevated troponin 10/16/2014  . HTN (hypertension) 10/16/2014  . 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 Vitamin D deficiency     Orientation RESPIRATION BLADDER Height & Weight     (pt is vented)  Tracheostomy(35%) Continent Weight: 144 lb 2.9 oz (65.4 kg) Height:  _0  (177.8 cm)  BEHAVIORAL SYMPTOMS/MOOD NEUROLOGICAL BOWEL NUTRITION STATUS      Incontinent Diet, Feeding tube(Cortrak feeding tube)  AMBULATORY STATUS COMMUNICATION OF NEEDS Skin   Extensive Assist Does not communicate PU Stage and Appropriate Care                        Personal Care Assistance Level of Assistance  Bathing, Feeding, Dressing Bathing Assistance: Maximum assistance Feeding assistance: Maximum assistance Dressing Assistance: Maximum assistance     Functional Limitations Info  Sight, Hearing, Speech Sight Info: Adequate Hearing Info: Adequate Speech Info: Impaired(Intubated/Tracheostomy - Unable to assess)    SPECIAL CARE FACTORS FREQUENCY  PT (By licensed PT), OT (By licensed OT)     PT Frequency: 2x OT Frequency: 2x            Contractures Contractures Info: Not present    Additional Factors Info  Code Status, Allergies Code Status Info: Partial Allergies Info: NO known allergies           Current Medications (06/24/2018):  This is the current hospital active medication list Current Facility-Administered Medications  Medication Dose Route Frequency Provider Last Rate Last Dose  . 0.9 %  sodium chloride infusion (Manually program via Guardrails IV Fluids)   Intravenous Once Flora Lipps, MD      . 0.9 %  sodium chloride infusion   Intravenous PRN Brand Males, MD 10 mL/hr at 06/19/18 1701 1,000 mL at 06/19/18 1701  . 0.9 %  sodium chloride infusion   Intravenous Continuous Rigoberto Noel, MD   Stopped at 06/23/18 2347  . 0.9 %  sodium chloride infusion   Intra-arterial PRN Rigoberto Noel, MD      . acetaminophen (TYLENOL) suppository 650 mg  650 mg Rectal Q6H PRN Rigoberto Noel,  MD       Or  . acetaminophen (TYLENOL) solution 650 mg  650 mg Per Tube Q6H PRN Rigoberto Noel, MD   650 mg at 06/24/18 0826  . artificial tears (LACRILUBE) ophthalmic ointment   Both Eyes PRN Brand Males, MD      . chlorhexidine gluconate (MEDLINE KIT) (PERIDEX) 0.12 % solution 15 mL  15 mL Mouth Rinse BID Mannam, Praveen, MD   15 mL at 06/24/18 0800  . Chlorhexidine Gluconate Cloth 2 % PADS 6 each  6 each Topical Q0600 Brand Males, MD   6 each at 06/24/18 0545  . clonazePAM (KLONOPIN) disintegrating tablet 1 mg  1 mg Per  Tube BID Rigoberto Noel, MD   1 mg at 06/23/18 2212  . Darbepoetin Alfa (ARANESP) injection 200 mcg  200 mcg Subcutaneous Q Sun-1800 Corliss Parish, MD   200 mcg at 06/19/18 1759  . feeding supplement (VITAL AF 1.2 CAL) liquid 1,000 mL  1,000 mL Per Tube Continuous Rigoberto Noel, MD 60 mL/hr at 06/24/18 0344 1,000 mL at 06/24/18 0344  . fentaNYL (SUBLIMAZE) injection 50 mcg  50 mcg Intravenous Q2H PRN Omar Person, NP   50 mcg at 06/22/18 1546  . fentaNYL 2533mg in NS 2534m(1080mml) infusion-PREMIX  0-400 mcg/hr Intravenous Continuous Mannam, Praveen, MD 5 mL/hr at 06/24/18 0600 50 mcg/hr at 06/24/18 0600  . Gerhardt's butt cream   Topical QID PRN AlvRigoberto NoelD      . heparin ADULT infusion 100 units/mL (25000 units/250m2mdium chloride 0.45%)  1,650 Units/hr Intravenous Continuous Lore, Melissa A, RPH 16.5 mL/hr at 06/24/18 0600 1,650 Units/hr at 06/24/18 0600  . heparin injection 1,000-6,000 Units  1,000-6,000 Units CRRT PRN GoldCorliss Parish   2,000 Units at 06/22/18 1018  . hydrALAZINE (APRESOLINE) injection 10 mg  10 mg Intravenous Q4H PRN McLeLarey Dresser   10 mg at 06/16/18 1011  . hydrALAZINE (APRESOLINE) tablet 25 mg  25 mg Oral Q8H Clegg, Amy D, NP      . Influenza vac split quadrivalent PF (FLUZONE HIGH-DOSE) injection 0.5 mL  0.5 mL Intramuscular Prior to discharge AlvaRigoberto Noel      . insulin aspart (novoLOG) injection 0-9 Units  0-9 Units Subcutaneous Q4H SommAnders Simmonds   1 Units at 06/24/18 0825  . insulin aspart (novoLOG) injection 5 Units  5 Units Subcutaneous Q6H AlvaRigoberto Noel   5 Units at 06/24/18 [phone] mouth rinse  15 mL Mouth Rinse 10 times per day Mannam, PravHart Robinsons   15 mL at 06/24/18 0544  . metoCLOPramide (REGLAN) injection 5 mg  5 mg Intravenous Q6H Desai, Rahul P, PA-C   5 mg at 06/24/18 0544  . midazolam (VERSED) injection 1-2 mg  1-2 mg Intravenous Q2H PRN GrocMagdalen Spatz   2 mg at 06/20/18 1609  .  norepinephrine (LEVOPHED) 16 mg in D5W 250mL37mmix infusion  0-60 mcg/min Intravenous Titrated SommeAnders Simmonds  Stopped at 06/23/18 1000  . pantoprazole sodium (PROTONIX) 40 mg/20 mL oral suspension 40 mg  40 mg Per Tube Q1200 Alva,Rigoberto Noel  40 mg at 06/23/18 1100  . piperacillin-tazobactam (ZOSYN) IVPB 3.375 g  3.375 g Intravenous Q12H Lore, Melissa A, RPH   Stopped at 06/24/18 0348  . QUEtiapine (SEROQUEL) tablet 200 mg  200 mg Oral BID Alva,Rigoberto Noel  200 mg at 06/23/18 2212  .  sodium chloride 0.9 % bolus 1,000 mL  1,000 mL Intravenous Once Hayden Pedro M, NP      . sodium chloride 0.9 % primer fluid for CRRT   CRRT PRN Dwana Melena, MD      . sodium chloride flush (NS) 0.9 % injection 10-40 mL  10-40 mL Intracatheter Q12H Mannam, Praveen, MD   10 mL at 06/23/18 2213  . sodium chloride flush (NS) 0.9 % injection 10-40 mL  10-40 mL Intracatheter PRN Mannam, Praveen, MD      . white petrolatum (VASELINE) gel   Topical PRN Clegg, Amy D, NP         Discharge Medications: Please see discharge summary for a list of discharge medications.  Relevant Imaging Results:  Relevant Lab Results:   Additional Information SSN: [id number]  Eileen Stanford, LCSW

## 2018-06-25 ENCOUNTER — Inpatient Hospital Stay (HOSPITAL_COMMUNITY): Payer: Medicare Other

## 2018-06-25 LAB — RENAL FUNCTION PANEL
ANION GAP: 12 (ref 5–15)
Albumin: 1.5 g/dL — ABNORMAL LOW (ref 3.5–5.0)
Albumin: 1.5 g/dL — ABNORMAL LOW (ref 3.5–5.0)
Anion gap: 14 (ref 5–15)
BUN: 92 mg/dL — AB (ref 8–23)
BUN: 77 mg/dL — ABNORMAL HIGH (ref 8–23)
CHLORIDE: 102 mmol/L (ref 98–111)
CHLORIDE: 102 mmol/L (ref 98–111)
CO2: 18 mmol/L — ABNORMAL LOW (ref 22–32)
CO2: 21 mmol/L — AB (ref 22–32)
CREATININE: 6.46 mg/dL — AB (ref 0.61–1.24)
Calcium: 8.5 mg/dL — ABNORMAL LOW (ref 8.9–10.3)
Calcium: 8.4 mg/dL — ABNORMAL LOW (ref 8.9–10.3)
Creatinine, Ser: 5.25 mg/dL — ABNORMAL HIGH (ref 0.61–1.24)
GFR calc Af Amer: 9 mL/min — ABNORMAL LOW (ref >60)
GFR calc non Af Amer: 8 mL/min — ABNORMAL LOW (ref >60)
GFR, EST AFRICAN AMERICAN: 11 mL/min — AB (ref >60)
GFR, EST NON AFRICAN AMERICAN: 10 mL/min — AB (ref >60)
GLUCOSE: 144 mg/dL — AB (ref 70–99)
Glucose, Bld: 174 mg/dL — ABNORMAL HIGH (ref 70–99)
POTASSIUM: 5.6 mmol/L — AB (ref 3.5–5.1)
POTASSIUM: 5.1 mmol/L (ref 3.5–5.1)
Phosphorus: 3.8 mg/dL (ref 2.5–4.6)
Phosphorus: 3.6 mg/dL (ref 2.5–4.6)
Sodium: 134 mmol/L — ABNORMAL LOW (ref 135–145)
Sodium: 135 mmol/L (ref 135–145)

## 2018-06-25 LAB — CULTURE, RESPIRATORY: GRAM STAIN: NONE SEEN

## 2018-06-25 LAB — GLUCOSE, CAPILLARY
GLUCOSE-CAPILLARY: 102 mg/dL — AB (ref 70–99)
GLUCOSE-CAPILLARY: 114 mg/dL — AB (ref 70–99)
GLUCOSE-CAPILLARY: 127 mg/dL — AB (ref 70–99)
GLUCOSE-CAPILLARY: 132 mg/dL — AB (ref 70–99)
GLUCOSE-CAPILLARY: 133 mg/dL — AB (ref 70–99)
Glucose-Capillary: 141 mg/dL — ABNORMAL HIGH (ref 70–99)
Glucose-Capillary: 126 mg/dL — ABNORMAL HIGH (ref 70–99)

## 2018-06-25 LAB — CBC
HCT: 24.3 % — ABNORMAL LOW (ref 39.0–52.0)
Hemoglobin: 7.9 g/dL — ABNORMAL LOW (ref 13.0–17.0)
MCH: 30.6 pg (ref 26.0–34.0)
MCHC: 32.5 g/dL (ref 30.0–36.0)
MCV: 94.2 fL (ref 80.0–100.0)
NRBC: 3.3 % — AB (ref 0.0–0.2)
PLATELETS: 575 10*3/uL — AB (ref 150–400)
RBC: 2.58 MIL/uL — ABNORMAL LOW (ref 4.22–5.81)
RDW: 20.7 % — AB (ref 11.5–15.5)
WBC: 24 10*3/uL — ABNORMAL HIGH (ref 4.0–10.5)

## 2018-06-25 LAB — MAGNESIUM: MAGNESIUM: 2.8 mg/dL — AB (ref 1.7–2.4)

## 2018-06-25 LAB — POCT ACTIVATED CLOTTING TIME: Activated Clotting Time: 202 seconds

## 2018-06-25 LAB — C DIFFICILE QUICK SCREEN W PCR REFLEX
C DIFFICILE (CDIFF) TOXIN: NEGATIVE
C Diff antigen: NEGATIVE
C Diff interpretation: NOT DETECTED

## 2018-06-25 LAB — CALCIUM, IONIZED: CALCIUM, IONIZED, SERUM: 4.1 mg/dL — AB (ref 4.5–5.6)

## 2018-06-25 LAB — VANCOMYCIN, RANDOM: Vancomycin Rm: 44

## 2018-06-25 LAB — CULTURE, RESPIRATORY W GRAM STAIN

## 2018-06-25 LAB — HEPARIN LEVEL (UNFRACTIONATED): Heparin Unfractionated: 0.48 IU/mL (ref 0.30–0.70)

## 2018-06-25 MED ORDER — HEPARIN SODIUM (PORCINE) 1000 UNIT/ML DIALYSIS: 1000.0000 [IU] | INTRAMUSCULAR | Status: DC | PRN

## 2018-06-25 MED ORDER — PRISMASOL BGK 4/2.5 32-4-2.5 MEQ/L REPLACEMENT SOLN
Status: DC
  Administered 2018-06-25 – 2018-06-26 (×2): via INTRAVENOUS_CENTRAL
  Filled 2018-06-25 (×3): qty 5000

## 2018-06-25 MED ORDER — PRISMASOL BGK 4/2.5 32-4-2.5 MEQ/L REPLACEMENT SOLN
Status: DC
  Administered 2018-06-25 – 2018-06-26 (×2): via INTRAVENOUS_CENTRAL
  Filled 2018-06-25 (×2): qty 5000

## 2018-06-25 MED ORDER — METOPROLOL TARTRATE 5 MG/5ML IV SOLN
2.5000 mg | INTRAVENOUS | Status: DC | PRN
  Administered 2018-06-25: 5 mg via INTRAVENOUS
  Filled 2018-06-25: qty 5

## 2018-06-25 MED ORDER — SODIUM CHLORIDE (PF) 0.9 % IJ SOLN
250.0000 [IU]/h | INTRAMUSCULAR | Status: DC
  Filled 2018-06-25: qty 2

## 2018-06-25 MED ORDER — HEPARIN BOLUS VIA INFUSION (CRRT)
1000.0000 [IU] | INTRAVENOUS | Status: DC | PRN
  Filled 2018-06-25: qty 1000

## 2018-06-25 MED ORDER — HEPARIN (PORCINE) 2000 UNITS/L FOR CRRT
INTRAVENOUS_CENTRAL | Status: DC | PRN
  Filled 2018-06-25: qty 1000

## 2018-06-25 MED ORDER — PRISMASOL BGK 4/2.5 32-4-2.5 MEQ/L IV SOLN
INTRAVENOUS | Status: DC
  Administered 2018-06-25 – 2018-06-28 (×17): via INTRAVENOUS_CENTRAL
  Filled 2018-06-25 (×26): qty 5000

## 2018-06-25 NOTE — Progress Notes (Signed)
Pharmacy Antibiotic Note  Bryan Parker is a 72 y.o. male admitted on 06/16/18 with possible aspiration pna/sepsis. Pharmacy has been consulted for vancomycin and zosyn dosing. Afebrile and WBC up to 24. All cultures negative.  Vancomycin random level this AM = 44 after re-dose yesterday.  CRRT resuming today.    Plan: -No further vancomycin for now. -Will recheck random level with AM labs. -Will continue to monitor renal function/HD plans, clinical improvement, and length of therapy   Height: 5\' 10"  (177.8 cm) Weight: 145 lb 4.5 oz (65.9 kg) IBW/kg (Calculated) : 73  Temp (24hrs), Avg:100.7 F (38.2 C), Min:99.2 F (37.3 C), Max:102.6 F (39.2 C)  Recent Labs  Lab 06/20/18 1549  06/21/18 0400  06/22/18 0410  06/23/18 0517 06/23/18 1507 06/24/18 0243 06/24/18 1029 06/25/18 0400 06/25/18 0401 06/25/18 0837  WBC  --   --  21.0*  --  18.0*  --  19.2*  --  21.2*  --  24.0*  --   --   CREATININE 2.21*   < > 2.07*   < > 1.66*   < > 3.34* 4.11* 4.84* 5.57*  --  6.46*  --   VANCOTROUGH 26*  --   --   --   --   --   --   --   --   --   --   --   --   VANCORANDOM  --   --   --   --   --   --   --   --   --   --   --   --  44   < > = values in this interval not displayed.    Estimated Creatinine Clearance: 9.8 mL/min (A) (by C-G formula based on SCr of 6.46 mg/dL (H)).    No Known Allergies  Dose adjustments this admission: 10/23 VR = 22 - vanc 750 mg q24h, pt started on CRRT (zosyn adjusted for CRRT) 10/25 VR = 36 - drawn after vancomycin was given 11/4 VT = 26 - dose already given prior to result, dec to 500 q24   Antimicrobials:  Zosyn 10/20>>10/31; 11/3 >>  Vancomycin 10/20 >> 10/25; 10/29>>11/6; 11/8>>  Cultures: 11/8 BCx: * 11/8 TA: * 11/6 TA: * 11/3: TA: few candida no organisms seen 10/29 BCx: Negative 10/29 TA: few candida, rare GPC  10/20 BCx: Negative 10/20 TA: normal flora 10/20 MRSA PCR: negative   Thank you for involving pharmacy in this patient's  care.  Jenetta Downer, [facility] Clinical Pharmacist Phone [phone]  06/25/2018 12:37 PM

## 2018-06-25 NOTE — Progress Notes (Signed)
ANTICOAGULATION CONSULT NOTE   Pharmacy Consult for Heparin Indication: suspected pulmonary embolus and confirmed DVT  No Known Allergies  Patient Measurements: Height: 5\' 10"  (177.8 cm) Weight: 145 lb 4.5 oz (65.9 kg) IBW/kg (Calculated) : 73  Vital Signs: Temp: 102.6 F (39.2 C) (11/09 0743) Temp Source: Oral (11/09 0743) BP: 119/77 (11/09 0700) Pulse Rate: 122 (11/09 0700)  Labs: Recent Labs    06/23/18 0517  06/23/18 1820 06/24/18 0243 06/24/18 1029 06/25/18 0400 06/25/18 0401  HGB 6.8*  --   --  8.0*  --  7.9*  --   HCT 21.7*  --   --  25.3*  --  24.3*  --   PLT 484*  --   --  539*  --  575*  --   HEPARINUNFRC  --    < > 0.35 0.36  --   --  0.48  CREATININE 3.34*   < >  --  4.84* 5.57*  --  6.46*   < > = values in this interval not displayed.    Estimated Creatinine Clearance: 9.8 mL/min (A) (by C-G formula based on SCr of 6.46 mg/dL (H)).  Assessment: 72yo male with DVT and likely PE s/p PEA arrest and TNKase 10/20 for heparin. Currently off CRRT. Trach site bleeding now resolved and heparin was restarted.  Heparin level therapeutic at 0.48; no bleeding or infusion issues noted. No changes indicated.  Goal of Therapy:  Heparin level 0.3-0.5 units/ml Monitor platelets by anticoagulation protocol: Yes   Plan:  Continue heparin 1650 units/hr Daily heparin level and CBC Monitor for signs and symptoms of bleeding  Jenetta Downer, [facility] Clinical Pharmacist Phone [phone]  06/25/2018 8:14 AM

## 2018-06-25 NOTE — Progress Notes (Signed)
Pt. Placed back on vent due to increase in RR and labored breathing.

## 2018-06-25 NOTE — Progress Notes (Signed)
RT attempted patient on ATC without success. Patient WOB immediately increased and had a RR in 50's. Dr. Vassie Loll aware. RT will continue to monitor.

## 2018-06-25 NOTE — Progress Notes (Signed)
Paged Dr Ronalee Belts to clarify CRRT orders. Orders received to prime set with heparinized saline and continue systemic heparin. MD also wanted an ACT twice on this shift. Will continue to monitor patient closely.

## 2018-06-25 NOTE — Progress Notes (Signed)
[facility] NEPHROLOGY PROGRESS NOTE  Assessment/ Plan: Pt is a 72 y.o. yo male with CKD (b/l cr 1.5-2 in 2016, on admission 2.5), history of PE presented with PEA arrest with hemodynamic instability and worsening renal failure.  #Acute kidney injury on CKD likely ATN in the setting of prolonged hypotension requiring pressors.  Do not think rhabdomyolysis causing renal failure.  -Restarted CRRT from 11/3-11/6 morning.  Patient remains anuric with hyperkalemia and acidosis.  He is febrile and tachycardic to upper 120s.  Discussed with ICU team.  Plan to restart CRRT today, heparin for anticoagulation.  He has no sign of renal recovery.   CT scan abdomen pelvis showed no hydronephrosis on 06/15/18.  -The temporary dialysis catheter was changed to L IJ on 06/20/2018.  #Rhabdomyolysis due to trauma/code: CK level is trending down.  #Hypotension/shock: Started midodrine on 11/8.  Blood pressure is better.  Off IV pressors.  #Anemia multifactorial etiology including bleeding from the tracheostomy site: Hemoglobin 8, received blood transfusion.  On Aranesp.  Iron saturation 25%  # Massive PE and PEA arrest: History of PE, currently on anticoagulation.  #Sepsis.  Patient is febrile, discussed with ICU team.   Subjective: Seen and examined at bedside.  Back on ventilator.  Tachycardic, febrile.  Review of system limited.  Not making urine. Objective Vital signs in last 24 hours: Vitals:   06/25/18 0600 06/25/18 0700 06/25/18 0743 06/25/18 0822  BP: (!) 177/95 119/77  125/68  Pulse: (!) 119 (!) 122    Resp: (!) 34 (!) 21  (!) 30  Temp:   (!) 102.6 F (39.2 C)   TempSrc:   Oral   SpO2: 100% 100%    Weight:      Height:       Weight change: 0.5 kg  Intake/Output Summary (Last 24 hours) at 06/25/2018 0856 Last data filed at 06/25/2018 0600 Gross per 24 hour  Intake 2450.37 ml  Output 200 ml  Net 2250.37 ml       Labs: Basic Metabolic Panel: Recent Labs  Lab  06/24/18 0243 06/24/18 1029 06/25/18 0401  NA 135 135 134*  K 4.9 5.2* 5.6*  CL 100 101 102  CO2 19* 20* 18*  GLUCOSE 129* 113* 144*  BUN 66* 75* 92*  CREATININE 4.84* 5.57* 6.46*  CALCIUM 8.4* 8.4* 8.5*  PHOS 3.3 3.5 3.8   Liver Function Tests: Recent Labs  Lab 06/24/18 0243 06/24/18 1029 06/25/18 0401  ALBUMIN 1.6* 1.5* 1.5*   No results for input(s): LIPASE, AMYLASE in the last 168 hours. No results for input(s): AMMONIA in the last 168 hours. CBC: Recent Labs  Lab 06/21/18 0400 06/22/18 0410 06/23/18 0517 06/24/18 0243 06/25/18 0400  WBC 21.0* 18.0* 19.2* 21.2* 24.0*  HGB 6.8* 7.6* 6.8* 8.0* 7.9*  HCT 20.4* 23.8* 21.7* 25.3* 24.3*  MCV 87.6 89.8 93.1 91.3 94.2  PLT 523* 521* 484* 539* 575*   Cardiac Enzymes: Recent Labs  Lab 06/19/18 0419  CKTOTAL 2,847*   CBG: Recent Labs  Lab 06/24/18 1138 06/24/18 2002 06/24/18 2338 06/25/18 0343 06/25/18 0740  GLUCAP 108* 110* 102* 132* 114*    Iron Studies:  No results for input(s): IRON, TIBC, TRANSFERRIN, FERRITIN in the last 72 hours. Studies/Results: Dg Chest Port 1 View  Result Date: 06/25/2018 CLINICAL DATA:  Respiratory distress. EXAM: PORTABLE CHEST 1 VIEW COMPARISON:  06/24/2018 FINDINGS: The tracheostomy tube is stable. Stable left IJ central venous catheter. Feeding tube is coursing down the esophagus and into the stomach.  The heart is normal in size. The mediastinal and hilar contours are normal. Stable underlying emphysematous changes and pulmonary scarring. Persistent streaky left basilar atelectasis and probable small effusion. IMPRESSION: 1. Stable support apparatus. 2. Chronic underlying emphysematous changes. 3. Minimal left basilar atelectasis and small left effusion. Electronically Signed   By: Marijo Sanes M.D.   On: 06/25/2018 02:51   Dg Chest Port 1 View  Result Date: 06/24/2018 CLINICAL DATA:  72 year old male with shortness of breath. PEA arrest. EXAM: PORTABLE CHEST 1 VIEW COMPARISON:   06/20/2018 and earlier. FINDINGS: Portable AP semi upright view at 0748 hours. Stable tracheostomy and left dual lumen dialysis type catheter. Right IJ single-lumen catheter removed. Stable visible enteric tube, tip not included. Mildly lower lung volumes. Mild increased interstitial markings, and crowding of markings at the left base. No pneumothorax, pleural effusion or consolidation. Normal cardiac size and mediastinal contours. Negative visible bowel gas pattern. IMPRESSION: 1. Right IJ single-lumen catheter removed. Otherwise, stable lines and tubes. 2. Mildly lower lung volumes with mild increased vascular congestion and left base atelectasis. Electronically Signed   By: Genevie Ann M.D.   On: 06/24/2018 09:38    Medications: Infusions: .  prismasol BGK 4/2.5    .  prismasol BGK 4/2.5    . sodium chloride 1,000 mL (06/19/18 1701)  . sodium chloride 10 mL/hr at 06/24/18 2000  . feeding supplement (VITAL AF 1.2 CAL) 60 mL/hr at 06/24/18 2000  . fentaNYL infusion INTRAVENOUS Stopped (06/24/18 0921)  . heparin 10,000 units/ 20 mL infusion syringe    . heparin 1,650 Units/hr (06/25/18 0600)  . heparin    . norepinephrine (LEVOPHED) Adult infusion Stopped (06/23/18 1000)  . piperacillin-tazobactam (ZOSYN)  IV Stopped (06/25/18 3704)  . prismasol BGK 4/2.5    . sodium chloride    . sodium chloride      Scheduled Medications: . sodium chloride   Intravenous Once  . chlorhexidine gluconate (MEDLINE KIT)  15 mL Mouth Rinse BID  . Chlorhexidine Gluconate Cloth  6 each Topical Q0600  . clonazepam  1 mg Per Tube BID  . darbepoetin (ARANESP) injection - NON-DIALYSIS  200 mcg Subcutaneous Q Sun-1800  . insulin aspart  0-9 Units Subcutaneous Q4H  . insulin aspart  5 Units Subcutaneous Q6H  . mouth rinse  15 mL Mouth Rinse 10 times per day  . midodrine  10 mg Oral TID WC  . pantoprazole sodium  40 mg Per Tube Q1200  . QUEtiapine  200 mg Oral BID  . sodium chloride flush  10-40 mL Intracatheter  Q12H  . vancomycin variable dose per unstable renal function (pharmacist dosing)   Does not apply See admin instructions    have reviewed scheduled and prn medications.  Physical Exam: General: On vent, not following commands Heart: Regular, tachycardic, s1s2 nl Lungs: Coarse breath sound bilateral, no wheezing Abdomen:soft, Non-tender, non-distended Extremities: No edema. Dialysis Access: Left IJ temporary catheter exchanged on 06/20/2018.  Haidyn Chadderdon Prasad Dayzee Trower 06/25/2018,8:56 AM  LOS: 20 days

## 2018-06-25 NOTE — Progress Notes (Signed)
PCXR completed. RT bagged and lavaged patient. At this time, patient is resting more comfortably with RR 29 and HR 128. Will continue to monitor.

## 2018-06-25 NOTE — Progress Notes (Signed)
Patient still with increased work of breathing (RR 40) and tachycardia (HR 137) on vent. Fentanyl 50 mcg given IV. Elink notified.

## 2018-06-25 NOTE — Progress Notes (Signed)
eLink Physician-Brief Progress Note Patient Name: GREENE DIODATO DOB: 04-28-1946 MRN: [id number]   Date of Service  06/25/2018  HPI/Events of Note  Episode of tachypnea and tachycardia, hooked back on ventilator.  eICU Interventions  CXR ordered without significant changes. Peak pressure 20s. Patient appears more comfortable now.     Intervention Category Intermediate Interventions: Respiratory distress - evaluation and management  Darl Pikes 06/25/2018, 4:26 AM

## 2018-06-25 NOTE — Progress Notes (Signed)
72 year old man admitted 10/20 post cardiac arrest with PEA received TNKase in the EDfor presumed PEdue to bedside echo showing dilated RV. He has a history of prior PE and anticoagulation was discontinued in 2018. Duplex showed left popliteal DVT He wasin severe cor pulmonale on high-dose epinephrine, Levophed, vasopressin and dobutamine.  Course complicated by multiorgan failure requiringCRRT and prolonged ventilationrequiring tracheostomy on 11/2.  He tolerated trach collar entire daytime last 2 days but tired out early a.m. and was put back on vent.  Secretions have decreased.  He remains febrile with increasing leukocytosis now. Very tachycardic this morning and when taken off the vent his respiratory rate was in the high 50s hence placed back.  Remains chronic critically ill, weak and deconditioned, intermittently follows one-step commands such as sticking out his tongue, soft and nontender abdomen, incontinent of stool, ischemic changes feet and toes.  Labs show increasing leukocytosis to 24, creatinine began rising and now hyperkalemic.  Chest x-ray repeated does not show any evidence of infiltrates.  Impression/plan  New fever/leukocytosis-no infiltrate on chest x-ray, respiratory cultures repeated and negative, lines are new, will check stool for C. difficile today, continue empiric antibiotics in the meantime, Zosyn and vancomycin.  Acute respiratory failure/tracheostomy-he seemed to tolerate trach collar in the daytime last 2 days but today appears worse, will continue to attempt trach collar as tolerated.  Acute encephalopathy-okay to use low-dose fentanyl for comfort, continue with Seroquel and clonazepam clonazepam at current doses.  Avoid propofol and Precedex since he drops his pressures are due to poorly functioning RV  AKI with hyperkalemia-he will certainly need dialysis today, with severe tachycardia would agree that CRRT can be instituted.  Massive PE/cardiac  arrest-continue heparin IV.  Disposition is pending, wife is coming to terms with the fact that he may not make it out of hospital setting.  Will defer PEG tube decision until next week.  The patient is critically ill with multiple organ systems failure and requires high complexity decision making for assessment and support, frequent evaluation and titration of therapies, application of advanced monitoring technologies and extensive interpretation of multiple databases. Critical Care Time devoted to patient care services described in this note independent of APP/resident  time is 35 minutes.   Comer Locket Vassie Loll MD [phone]

## 2018-06-25 NOTE — Progress Notes (Signed)
Patient had a coughing episode, suctioned trach with scant amount of pink tinged secretions noted. Patient noted to have RR 60-70's with HR 130's. Called RT to place patient back on vent. Patient given Versed 2 mg IV after placed back on vent.

## 2018-06-25 NOTE — Progress Notes (Signed)
Paged Vassie Loll MD about patient having issues with hypertension. Orders received for PRN metoprolol and BP goals were verbally given to keep systolic less than 180. Will continue to monitor.

## 2018-06-26 LAB — GLUCOSE, CAPILLARY
GLUCOSE-CAPILLARY: 135 mg/dL — AB (ref 70–99)
Glucose-Capillary: 97 mg/dL (ref 70–99)
Glucose-Capillary: 136 mg/dL — ABNORMAL HIGH (ref 70–99)
Glucose-Capillary: 96 mg/dL (ref 70–99)
Glucose-Capillary: 105 mg/dL — ABNORMAL HIGH (ref 70–99)
Glucose-Capillary: 81 mg/dL (ref 70–99)

## 2018-06-26 LAB — CBC
HEMATOCRIT: 25.8 % — AB (ref 39.0–52.0)
HEMOGLOBIN: 8 g/dL — AB (ref 13.0–17.0)
MCH: 29.3 pg (ref 26.0–34.0)
MCHC: 31 g/dL (ref 30.0–36.0)
MCV: 94.5 fL (ref 80.0–100.0)
Platelets: 679 10*3/uL — ABNORMAL HIGH (ref 150–400)
RBC: 2.73 MIL/uL — ABNORMAL LOW (ref 4.22–5.81)
RDW: 22.1 % — AB (ref 11.5–15.5)
WBC: 24.9 10*3/uL — AB (ref 4.0–10.5)
nRBC: 1.5 % — ABNORMAL HIGH (ref 0.0–0.2)

## 2018-06-26 LAB — RENAL FUNCTION PANEL
ANION GAP: 12 (ref 5–15)
Albumin: 1.7 g/dL — ABNORMAL LOW (ref 3.5–5.0)
Albumin: 1.6 g/dL — ABNORMAL LOW (ref 3.5–5.0)
Anion gap: 14 (ref 5–15)
BUN: 58 mg/dL — ABNORMAL HIGH (ref 8–23)
BUN: 46 mg/dL — ABNORMAL HIGH (ref 8–23)
CALCIUM: 8.6 mg/dL — AB (ref 8.9–10.3)
CO2: 22 mmol/L (ref 22–32)
CO2: 22 mmol/L (ref 22–32)
CREATININE: 3.79 mg/dL — AB (ref 0.61–1.24)
Calcium: 8.3 mg/dL — ABNORMAL LOW (ref 8.9–10.3)
Chloride: 101 mmol/L (ref 98–111)
Chloride: 102 mmol/L (ref 98–111)
Creatinine, Ser: 2.9 mg/dL — ABNORMAL HIGH (ref 0.61–1.24)
GFR calc Af Amer: 24 mL/min — ABNORMAL LOW (ref >60)
GFR calc non Af Amer: 20 mL/min — ABNORMAL LOW (ref >60)
GFR, EST AFRICAN AMERICAN: 17 mL/min — AB (ref >60)
GFR, EST NON AFRICAN AMERICAN: 15 mL/min — AB (ref >60)
Glucose, Bld: 116 mg/dL — ABNORMAL HIGH (ref 70–99)
Glucose, Bld: 146 mg/dL — ABNORMAL HIGH (ref 70–99)
Phosphorus: 3.2 mg/dL (ref 2.5–4.6)
Phosphorus: 3.2 mg/dL (ref 2.5–4.6)
Potassium: 5 mmol/L (ref 3.5–5.1)
Potassium: 5.1 mmol/L (ref 3.5–5.1)
SODIUM: 137 mmol/L (ref 135–145)
SODIUM: 136 mmol/L (ref 135–145)

## 2018-06-26 LAB — VANCOMYCIN, RANDOM: Vancomycin Rm: 32

## 2018-06-26 LAB — APTT: aPTT: 123 seconds — ABNORMAL HIGH (ref 24–36)

## 2018-06-26 LAB — HEPARIN LEVEL (UNFRACTIONATED)
HEPARIN UNFRACTIONATED: 0.88 [IU]/mL — AB (ref 0.30–0.70)
Heparin Unfractionated: 0.65 IU/mL (ref 0.30–0.70)

## 2018-06-26 LAB — MAGNESIUM: Magnesium: 2.6 mg/dL — ABNORMAL HIGH (ref 1.7–2.4)

## 2018-06-26 LAB — CALCIUM, IONIZED
CALCIUM, IONIZED, SERUM: 4.4 mg/dL — AB (ref 4.5–5.6)
Calcium, Ionized, Serum: 4.1 mg/dL — ABNORMAL LOW (ref 4.5–5.6)

## 2018-06-26 MED ORDER — PRISMASOL BGK 0/2.5 32-2.5 MEQ/L REPLACEMENT SOLN
Status: DC
  Administered 2018-06-26 – 2018-06-27 (×3): via INTRAVENOUS_CENTRAL
  Filled 2018-06-26 (×5): qty 5000

## 2018-06-26 MED ORDER — PRISMASOL BGK 0/2.5 32-2.5 MEQ/L REPLACEMENT SOLN
Status: DC
  Administered 2018-06-26 – 2018-06-28 (×4): via INTRAVENOUS_CENTRAL
  Filled 2018-06-26 (×6): qty 5000

## 2018-06-26 NOTE — Progress Notes (Signed)
ANTICOAGULATION CONSULT NOTE   Pharmacy Consult for Heparin Indication: suspected pulmonary embolus and confirmed DVT  No Known Allergies  Patient Measurements: Height: 5\' 10"  (177.8 cm) Weight: 143 lb 8.3 oz (65.1 kg) IBW/kg (Calculated) : 73  Vital Signs: Temp: 98.9 F (37.2 C) (11/10 0728) Temp Source: Oral (11/10 0728) BP: 134/71 (11/10 0700) Pulse Rate: 117 (11/10 0700)  Labs: Recent Labs    06/24/18 0243  06/25/18 0400 06/25/18 0401 06/25/18 1633 06/26/18 0446  HGB 8.0*  --  7.9*  --   --  8.0*  HCT 25.3*  --  24.3*  --   --  25.8*  PLT 539*  --  575*  --   --  679*  APTT  --   --   --   --   --  123*  HEPARINUNFRC 0.36  --   --  0.48  --  0.88*  CREATININE 4.84*   < >  --  6.46* 5.25* 3.79*   < > = values in this interval not displayed.    Estimated Creatinine Clearance: 16.5 mL/min (A) (by C-G formula based on SCr of 3.79 mg/dL (H)).  Assessment: 72yo male with DVT and likely PE s/p PEA arrest and TNKase 10/20 for heparin. Currently off CRRT. Trach site bleeding now resolved and heparin was restarted.  Heparin level above goal range at 0.88; no bleeding or infusion issues noted.  Goal of Therapy:  Heparin level 0.3-0.5 units/ml Monitor platelets by anticoagulation protocol: Yes   Plan:  Reduce heparin infusion to 1550 units/hr. Recheck heparin level in 8 hrs. Daily heparin level and CBC Monitor for signs and symptoms of bleeding  Jenetta Downer, [facility] Clinical Pharmacist Phone [phone]  06/26/2018 7:45 AM

## 2018-06-26 NOTE — Progress Notes (Signed)
[facility] NEPHROLOGY PROGRESS NOTE  Assessment/ Plan: Pt is a 72 y.o. yo male with CKD (b/l cr 1.5-2 in 2016, on admission 2.5), history of PE presented with PEA arrest with hemodynamic instability and worsening renal failure.  #Acute kidney injury on CKD likely ATN in the setting of prolonged hypotension requiring pressors.  Do not think rhabdomyolysis causing renal failure.  -CRRT from 11/3-11/6 morning. Restart CRRT on 11/9 for anuria, hyperkalemia and tachycardia.  Changed to 0K bath in prefilter and post filter, 4K bath on dialysate fluid.  Monitor labs.  Heparin for anticoagulation.  He has no sign of renal recovery.    CT scan abdomen pelvis showed no hydronephrosis on 06/15/18.  -The temporary dialysis catheter was changed to L IJ on 06/20/2018.  #Rhabdomyolysis due to trauma/code: CK level is trending down.  #Hypotension/shock: Blood pressure improved.  Midodrine discontinued.    #Anemia multifactorial etiology including bleeding from the tracheostomy site: Hemoglobin 8, received blood transfusion.  On Aranesp.  Iron saturation 25%  # Massive PE and PEA arrest: History of PE, currently on anticoagulation.  #Sepsis.  Patient is afebrile today.  On antibiotics.   Subjective: Seen and examined at bedside.  No urine output.  He is afebrile.  Remains tachycardic.  He is more alert awake and following simple commands today.  Patient's wife at bedside. Objective Vital signs in last 24 hours: Vitals:   06/26/18 0700 06/26/18 0728 06/26/18 0754 06/26/18 0800  BP: 134/71  134/71 (!) 154/80  Pulse: (!) 117   (!) 122  Resp: (!) 36  (!) 36 (!) 32  Temp:  98.9 F (37.2 C)    TempSrc:  Oral    SpO2: 100%   100%  Weight:      Height:       Weight change: -0.8 kg  Intake/Output Summary (Last 24 hours) at 06/26/2018 0917 Last data filed at 06/26/2018 0800 Gross per 24 hour  Intake 2038.15 ml  Output 2963 ml  Net -924.85 ml       Labs: Basic Metabolic  Panel: Recent Labs  Lab 06/25/18 0401 06/25/18 1633 06/26/18 0446  NA 134* 135 137  K 5.6* 5.1 5.0  CL 102 102 101  CO2 18* 21* 22  GLUCOSE 144* 174* 116*  BUN 92* 77* 58*  CREATININE 6.46* 5.25* 3.79*  CALCIUM 8.5* 8.4* 8.6*  PHOS 3.8 3.6 3.2   Liver Function Tests: Recent Labs  Lab 06/25/18 0401 06/25/18 1633 06/26/18 0446  ALBUMIN 1.5* 1.5* 1.7*   No results for input(s): LIPASE, AMYLASE in the last 168 hours. No results for input(s): AMMONIA in the last 168 hours. CBC: Recent Labs  Lab 06/22/18 0410 06/23/18 0517 06/24/18 0243 06/25/18 0400 06/26/18 0446  WBC 18.0* 19.2* 21.2* 24.0* 24.9*  HGB 7.6* 6.8* 8.0* 7.9* 8.0*  HCT 23.8* 21.7* 25.3* 24.3* 25.8*  MCV 89.8 93.1 91.3 94.2 94.5  PLT 521* 484* 539* 575* 679*   Cardiac Enzymes: No results for input(s): CKTOTAL, CKMB, CKMBINDEX, TROPONINI in the last 168 hours. CBG: Recent Labs  Lab 06/25/18 1634 06/25/18 2048 06/25/18 2338 06/26/18 0327 06/26/18 0730  GLUCAP 141* 126* 127* 97 136*    Iron Studies:  No results for input(s): IRON, TIBC, TRANSFERRIN, FERRITIN in the last 72 hours. Studies/Results: Dg Chest Port 1 View  Result Date: 06/25/2018 CLINICAL DATA:  Respiratory distress. EXAM: PORTABLE CHEST 1 VIEW COMPARISON:  06/24/2018 FINDINGS: The tracheostomy tube is stable. Stable left IJ central venous catheter. Feeding tube is coursing  down the esophagus and into the stomach. The heart is normal in size. The mediastinal and hilar contours are normal. Stable underlying emphysematous changes and pulmonary scarring. Persistent streaky left basilar atelectasis and probable small effusion. IMPRESSION: 1. Stable support apparatus. 2. Chronic underlying emphysematous changes. 3. Minimal left basilar atelectasis and small left effusion. Electronically Signed   By: Marijo Sanes M.D.   On: 06/25/2018 02:51    Medications: Infusions: . sodium chloride 1,000 mL (06/19/18 1701)  . sodium chloride 10 mL/hr at  06/26/18 0800  . feeding supplement (VITAL AF 1.2 CAL) 60 mL/hr at 06/26/18 0800  . fentaNYL infusion INTRAVENOUS Stopped (06/24/18 0921)  . heparin 10,000 units/ 20 mL infusion syringe    . heparin 1,550 Units/hr (06/26/18 0800)  . heparin    . norepinephrine (LEVOPHED) Adult infusion Stopped (06/23/18 1000)  . piperacillin-tazobactam (ZOSYN)  IV Stopped (06/26/18 0342)  . prismasol BGK 0/2.5    . prismasol BGK 0/2.5    . prismasol BGK 4/2.5 1,500 mL/hr at 06/26/18 0840  . sodium chloride    . sodium chloride      Scheduled Medications: . sodium chloride   Intravenous Once  . chlorhexidine gluconate (MEDLINE KIT)  15 mL Mouth Rinse BID  . Chlorhexidine Gluconate Cloth  6 each Topical Q0600  . clonazepam  1 mg Per Tube BID  . darbepoetin (ARANESP) injection - NON-DIALYSIS  200 mcg Subcutaneous Q Sun-1800  . insulin aspart  0-9 Units Subcutaneous Q4H  . insulin aspart  5 Units Subcutaneous Q6H  . mouth rinse  15 mL Mouth Rinse 10 times per day  . pantoprazole sodium  40 mg Per Tube Q1200  . QUEtiapine  200 mg Oral BID  . sodium chloride flush  10-40 mL Intracatheter Q12H  . vancomycin variable dose per unstable renal function (pharmacist dosing)   Does not apply See admin instructions    have reviewed scheduled and prn medications.  Physical Exam: General: On vent, alert awake and following commands. Heart: Regular, tachycardic, s1s2 nl Lungs: Coarse breath sound bilateral, no wheezing Abdomen:soft, Non-tender, non-distended Extremities: No edema. Dialysis Access: Left IJ temporary catheter exchanged on 06/20/2018.  Demiah Gullickson Prasad Shreyas Piatkowski 06/26/2018,9:17 AM  LOS: 21 days

## 2018-06-26 NOTE — Progress Notes (Signed)
Pt placed back on full support at this time due to increased WOB & RR >45.  Pt tolerating well, RT will monitor

## 2018-06-26 NOTE — Progress Notes (Signed)
RT placed patient on 35% ATC. Patient is tolerating well at this time. RN aware. RT will continue to monitor.

## 2018-06-26 NOTE — Progress Notes (Addendum)
Pharmacy Antibiotic Note  Bryan Parker is a 72 y.o. male admitted on 07/04/18 with possible aspiration pna/sepsis. Pharmacy has been consulted for vancomycin and zosyn dosing. Afebrile, but WBC up to 24.9. All cultures negative.  Vancomycin re-dosed 11/8 Vancomycin random level 11/9 = 44 - CRRT restarted Vancomycin random level 11/10 = 32 - remains on CRRT   Plan: -No further vancomycin for today. -Will recheck random level with AM labs.  Will likely need to resume maintenance dosing tomorrow. -Will continue to monitor renal function/HD plans, clinical improvement, and length of therapy   Height: 5\' 10"  (177.8 cm) Weight: 143 lb 8.3 oz (65.1 kg) IBW/kg (Calculated) : 73  Temp (24hrs), Avg:98.6 F (37 C), Min:96.5 F (35.8 C), Max:102.6 F (39.2 C)  Recent Labs  Lab 06/20/18 1549  06/22/18 0410  06/23/18 0517  06/24/18 0243 06/24/18 1029 06/25/18 0400 06/25/18 0401 06/25/18 0837 06/25/18 1633 06/26/18 0446  WBC  --    < > 18.0*  --  19.2*  --  21.2*  --  24.0*  --   --   --  24.9*  CREATININE 2.21*   < > 1.66*   < > 3.34*   < > 4.84* 5.57*  --  6.46*  --  5.25* 3.79*  VANCOTROUGH 26*  --   --   --   --   --   --   --   --   --   --   --   --   VANCORANDOM  --   --   --   --   --   --   --   --   --   --  44  --  32   < > = values in this interval not displayed.    Estimated Creatinine Clearance: 16.5 mL/min (A) (by C-G formula based on SCr of 3.79 mg/dL (H)).    No Known Allergies  Dose adjustments this admission: 10/23 VR 22, started CRRT 10/25 VR 36- drawn AFTER dose 11/4 VT 26 - dose already given prior to result, dec to 500 q24  11/8 Vanc redose 1500 x 1 11/9 (CRRT resumed) VR = 44 11/10 - VR 32  Antimicrobials: 10/20 Zosyn >>10/31; 11/3>> 10/20 Vanc >> 10/25; 10/29>>11/6; 11/8>>  Cultures: 10/20 Trach: normal resp flora 10/20 Blood cx: ng 10/20 MRSA PCR negative 10/29 Trach: rare GPC, few candida albicans 10/29 BCx: ngtd 11/3 Trach: few candida  albicans, no organisms 11/6 Resp: few candida albicans 11/8 Bcx x 2: ngtd 11/8 TA: few yeast 11/9 C.diff neg  Thank you for involving pharmacy in this patient's care.  Jenetta Downer, [facility] Clinical Pharmacist Phone [phone]  06/26/2018 7:42 AM

## 2018-06-26 NOTE — Progress Notes (Signed)
NAME:  Bryan Parker, MRN:  [id number], DOB:  06-30-1946, LOS: 21 ADMISSION DATE:  06-21-18, CONSULTATION DATE:  06/21/2018 REFERRING MD:  Dr. Rhunette Croft , CHIEF COMPLAINT:  Cardiac Arrest    Brief History    72 year old man admitted 10/20 post cardiac arrest with PEA received TNKase in the EDfor presumed PEdue to bedside echo showing dilated RV. He has a history of prior PE and anticoagulation was discontinued in 2018. Duplex showed left popliteal DVT He wasin severe cor pulmonale on high-dose epinephrine, Levophed, vasopressin and dobutamine.  Course complicated by multiorgan failure requiringCRRT and prolonged ventilationrequiring tracheostomy on 11/2.  Past Medical History  H/O DVT/PE, CKD  Significant Hospital Events   10/20 > Presents to ED  1021 with a partial code no CPR 10/22 bleeding from IV sites and foley, heparin d/c'd for 2 hours then resumed. Hgb 6.8 > 1u PRBC transfused 10/23 CRRT started.  Giapreza added. 10/24 coude catheter placed by urology and CBI started. 10/25 - Pressor requirements much improved after giaprezza added 10/23; however, remains on vaso0.03, NE 23, epi 10, dobut 20, giapreza 25. CK up from 14.5 on 10/24 up to 35K on 10/25.  CRRT running.  10/26 - CK 31,000 (was 34,000 yesterday) . Volume removal continues. Fluid challenge strategy cancelled. CK better. Legs thinner with volume removal. Pressors: off giaprezza, levophed 6, dobutamine 10, epi 16 and vaso 0.03. Had vomit yesterday and TF on hold. Has bloody urine - seen by urology  10/27  - pressor needs coming down - off epi gtt. Dobutamine 10, Vaso 0.03 and levophed 4-58mcg/min. . Still largely volume . Volume removal via CRRT. CK down to 24K. TF still on hold.  10/28 brady , hypotensive with precedex 11/2>> Trach 11/3 bleeding around trach site - packed , heparin held   Consults: date of consult/date signed off & final recs:  PCCM 10/20 Heart failure 10/21 >  Urology 10/24 >   renal  Procedures (surgical and bedside):  ETT 10/20 >>11/2 Trach (JY) 11/2>>> Right Femoral CVC 10/20 >> 10/29 Left Femoral Aline 10/20 >> 10/30 R IJ HD cath 10/23 > 11/4 Rt radial a line 10/30 >> out L IJ HD cath 11/4 >>  Significant Diagnostic Tests:  CT Head 10/20 >> neg Echo 10/20 > EF 55 - 60%, severely dilated RV, mod dilated RA, mod TR. CT chest/abd/ pelvis 10/30  >> no bleed CT head 10/30 neg  Micro Data:  Blood 10/20 >> neg Sputum 10/20 >> neg U/A 10/20 >> neg Blood 10/29 > ng resp 10/29  >>ng Sputum 10/29>> GS few gram +>>  Few candida albicans Blood 10/29>> ng Blood 11/8 >> ng  c diff 11/8 neg resp 11/3, 11/8  >> ng Antimicrobials:  zosyn 10/20 >> 11/1 , 11/3 >> vanc 10/20 >>off, 10/29 >>11/6, 11/8 >> 11/10  SUBJECTIVE/OVERNIGHT/INTERVAL HX   Remains critically ill, on CRRT, tachy cardic defervesced anuric   Objective   Blood pressure (!) 140/91, pulse (!) 118, temperature 98.9 F (37.2 C), temperature source Oral, resp. rate (!) 32, height 5\' 10"  (1.778 m), weight 65.1 kg, SpO2 100 %. CVP:  [13 mmHg] 13 mmHg  Vent Mode: PRVC FiO2 (%):  [30 %] 30 % Set Rate:  [14 bmp] 14 bmp Vt Set:  [500 mL] 500 mL PEEP:  [5 cmH20] 5 cmH20 Plateau Pressure:  [9 cmH20-21 cmH20] 21 cmH20   Intake/Output Summary (Last 24 hours) at 06/26/2018 1005 Last data filed at 06/26/2018 0900 Gross per 24 hour  Intake 2037.15  ml  Output 3092 ml  Net -1054.85 ml   Filed Weights   06/24/18 0349 06/25/18 0500 06/26/18 0500  Weight: 65.4 kg 65.9 kg 65.1 kg    Appears chronic critically ill, deconditioned. Eyes open, follows intermittent one-step commands Decreased breath sounds bilateral, mild tan secretions S1-S2 tacky, no murmur Soft nontender abdomen Skin peeling off both shins and feet, black toes Left IJ catheter site looks clean  Chest x-ray 11/9 shows mild left lower lobe atelectasis otherwise clear  Assessment & Plan:   Acute resp failure - s/p intubation  in ED.  PLAN Tolerated trach collar in the daytime for 2 days on 11/7 and 11/8, will attempt again    Acute Encephalopathy - metabolic + from PEA arrest. -AVOID propofol and  Precedex due to hypotension and poor RV -Intermittent fentanyl -Continue Seroquel to 200 and  clonazepam  PEA Cardiac Arrest - concern due to massive PE.   New shock 10/29 likely due to sepsis - resolved   Presumed PE - s/p TPA in ED. LLE DVT. Hx PE / DVT 2016 (no longer on anticoagulation). Plan: Continue IV heparin  Fever 10/29- All femoral lines dc'd & defervesced,CT non diagnostic New fever 11/8, cultures all negative, C. difficile negative Plan: -Discontinue vancomycin, obtain procalcitonin to decide duration on Zosyn  AKI on Chronic Kidney Failure - likely exacerbated by arrest and rhabdo.  Now on CRRT. Rhabdomyolysis - CK peaked at 35K on 10/25  Hyponatremia - Nephrology following - CRRT  per renal , can switch to intermittent HD in my opinion if and if tachycardic -  Acute blood loss anemia - had bleeding from IV sites and foley - about 5-6 U PRBC so far  PLAN - Maintain Hgb > 7 since he is 2 weeks out from original episode   Urethral bleeding - likely traumatic following tPA administration. - Urology seen, doubt need for cystoscopy. - Foley discontinued   Hyperglycemia. - Continue SSI.  Protein calorie malnutrition PLAN  -TF @ goal  Resolved problems : Shock - likely mixed, obstructive  and likely septic/HCAP vs aspiration.given PCT > 150 - Resolved Thrombocytopenia. Trach bleed  Disposition / Summary of Today's Plan 06/26/18   He has not rebounded after shock resolved due to multiorgan failure.  He will likely be dialysis dependent and will need a permacath eventually.  He may also lose his toes.  He will also need PEG tube placement next week.  Unfortunately not a candidate for LTAC and will need a vent SNF.  I encouraged the wife today to consider more palliative  approach   Code Status: 06/07/2018 partial code no CPR Family Communication: Wife updated daily  ATTESTATION & SIGNATURE   The patient is critically ill with multiple organ systems failure and requires high complexity decision making for assessment and support, frequent evaluation and titration of therapies, application of advanced monitoring technologies and extensive interpretation of multiple databases. Critical Care Time devoted to patient care services described in this note independent of APP/resident  time is 32 minutes.     Cyril Mourning MD. Tonny Bollman. [facility] Pager [phone] If no response call [phone]   06/26/2018

## 2018-06-26 NOTE — Progress Notes (Signed)
ANTICOAGULATION CONSULT NOTE   Pharmacy Consult for Heparin Indication: suspected pulmonary embolus and confirmed DVT  No Known Allergies  Patient Measurements: Height: 5\' 10"  (177.8 cm) Weight: 143 lb 8.3 oz (65.1 kg) IBW/kg (Calculated) : 73  Vital Signs: Temp: 97.9 F (36.6 C) (11/10 1605) Temp Source: Axillary (11/10 1605) BP: 111/81 (11/10 1500) Pulse Rate: 112 (11/10 1500)  Labs: Recent Labs    06/24/18 0243  06/25/18 0400 06/25/18 0401 06/25/18 1633 06/26/18 0446 06/26/18 1551  HGB 8.0*  --  7.9*  --   --  8.0*  --   HCT 25.3*  --  24.3*  --   --  25.8*  --   PLT 539*  --  575*  --   --  679*  --   APTT  --   --   --   --   --  123*  --   HEPARINUNFRC 0.36  --   --  0.48  --  0.88* 0.65  CREATININE 4.84*   < >  --  6.46* 5.25* 3.79* 2.90*   < > = values in this interval not displayed.    Estimated Creatinine Clearance: 21.5 mL/min (A) (by C-G formula based on SCr of 2.9 mg/dL (H)).  Assessment: 72yo male with DVT and likely PE s/p PEA arrest and TNKase 10/20 for heparin. Currently off CRRT. Trach site bleeding now resolved and heparin was restarted.  Heparin level remains elevated, no bleeding per RN.  Goal of Therapy:  Heparin level 0.3-0.5 units/ml Monitor platelets by anticoagulation protocol: Yes   Plan:  Reduce heparin infusion to 1400 units/hr. Recheck heparin level with morning labs  Fredonia Highland, PharmD, BCPS Clinical Pharmacist [phone] Please check AMION for all [facility] numbers 06/26/2018

## 2018-06-27 DIAGNOSIS — J9602 Acute respiratory failure with hypercapnia: Secondary | ICD-10-CM

## 2018-06-27 DIAGNOSIS — L89302 Pressure ulcer of unspecified buttock, stage 2: Secondary | ICD-10-CM

## 2018-06-27 LAB — RENAL FUNCTION PANEL
ANION GAP: 9 (ref 5–15)
Albumin: 1.8 g/dL — ABNORMAL LOW (ref 3.5–5.0)
Albumin: 1.8 g/dL — ABNORMAL LOW (ref 3.5–5.0)
Anion gap: 12 (ref 5–15)
BUN: 37 mg/dL — AB (ref 8–23)
BUN: 37 mg/dL — ABNORMAL HIGH (ref 8–23)
CALCIUM: 8.4 mg/dL — AB (ref 8.9–10.3)
CALCIUM: 8.3 mg/dL — AB (ref 8.9–10.3)
CHLORIDE: 101 mmol/L (ref 98–111)
CO2: 23 mmol/L (ref 22–32)
CO2: 26 mmol/L (ref 22–32)
Chloride: 101 mmol/L (ref 98–111)
Creatinine, Ser: 2.47 mg/dL — ABNORMAL HIGH (ref 0.61–1.24)
Creatinine, Ser: 2.55 mg/dL — ABNORMAL HIGH (ref 0.61–1.24)
GFR calc Af Amer: 28 mL/min — ABNORMAL LOW (ref >60)
GFR calc non Af Amer: 24 mL/min — ABNORMAL LOW (ref >60)
GFR, EST AFRICAN AMERICAN: 29 mL/min — AB (ref >60)
GFR, EST NON AFRICAN AMERICAN: 25 mL/min — AB (ref >60)
GLUCOSE: 108 mg/dL — AB (ref 70–99)
Glucose, Bld: 112 mg/dL — ABNORMAL HIGH (ref 70–99)
Phosphorus: 2.5 mg/dL (ref 2.5–4.6)
Phosphorus: 2.7 mg/dL (ref 2.5–4.6)
Potassium: 4.8 mmol/L (ref 3.5–5.1)
Potassium: 4.3 mmol/L (ref 3.5–5.1)
SODIUM: 136 mmol/L (ref 135–145)
Sodium: 136 mmol/L (ref 135–145)

## 2018-06-27 LAB — CULTURE, RESPIRATORY W GRAM STAIN

## 2018-06-27 LAB — GLUCOSE, CAPILLARY
GLUCOSE-CAPILLARY: 124 mg/dL — AB (ref 70–99)
GLUCOSE-CAPILLARY: 107 mg/dL — AB (ref 70–99)
GLUCOSE-CAPILLARY: 132 mg/dL — AB (ref 70–99)
Glucose-Capillary: 115 mg/dL — ABNORMAL HIGH (ref 70–99)
Glucose-Capillary: 146 mg/dL — ABNORMAL HIGH (ref 70–99)
Glucose-Capillary: 117 mg/dL — ABNORMAL HIGH (ref 70–99)
Glucose-Capillary: 153 mg/dL — ABNORMAL HIGH (ref 70–99)

## 2018-06-27 LAB — APTT: APTT: 76 s — AB (ref 24–36)

## 2018-06-27 LAB — MAGNESIUM: MAGNESIUM: 2.7 mg/dL — AB (ref 1.7–2.4)

## 2018-06-27 LAB — HEPARIN LEVEL (UNFRACTIONATED): HEPARIN UNFRACTIONATED: 0.46 [IU]/mL (ref 0.30–0.70)

## 2018-06-27 MED ORDER — PIPERACILLIN-TAZOBACTAM IN DEX 2-0.25 GM/50ML IV SOLN
2.2500 g | Freq: Four times a day (QID) | INTRAVENOUS | Status: DC
  Administered 2018-06-27 – 2018-06-28 (×2): 2.25 g via INTRAVENOUS
  Filled 2018-06-27 (×3): qty 50

## 2018-06-27 MED ORDER — LIDOCAINE HCL (PF) 1 % IJ SOLN
3.0000 mL | Freq: Once | INTRAMUSCULAR | Status: AC
  Administered 2018-06-27: 3 mL
  Filled 2018-06-27: qty 5

## 2018-06-27 MED ORDER — QUETIAPINE FUMARATE 100 MG PO TABS
200.0000 mg | ORAL_TABLET | Freq: Two times a day (BID) | ORAL | Status: DC
  Administered 2018-06-27 – 2018-06-28 (×3): 200 mg
  Filled 2018-06-27 (×3): qty 2

## 2018-06-27 NOTE — Clinical Social Work Note (Signed)
Referral was sent to [facility]F for review.   [place], [place] [phone]

## 2018-06-27 NOTE — Consult Note (Signed)
Consultation Note Date: 06/27/2018   Patient Name: Bryan Parker  DOB: 10-19-1945  MRN: [id number]  Age / Sex: 72 y.o., male  PCP: Card, Elwyn Lade, MD Referring Physician: Garner Nash, DO  Reason for Consultation: Establishing goals of care and Psychosocial/spiritual support  HPI/Patient Profile: 72 y.o. male   admitted on 05/18/2018  post PEA cardiac arrest  Day 23 of hospital stay. -AKI/CKD stage 4- in setting of PEA arrest (following massive PE) while on ACE inhibition, shock, severe cor pulmonale/CHF, and pressors.  He is  oliguric and dialysis dependent since 06/08/18.VDRF s/p trach- per PCCM -PEA cardiac arrest due to massive PE- with AMS and ARF. -AMS-  Multifactorial. Per PCCM - Sepsis- on 06/14/18 cultures negative and off of pressors  Course complicated by multiorgan failure requiringCRRT and prolonged ventilationrequiring tracheostomy on 11/2.  Cognitively patient is unable to follow commands today.  Family face treatment option decisions, advanced directive decisions and  anticipatory grief for the loss of their loved one.  Clinical Assessment and Goals of Care:   This NP Bryan Parker reviewed medical records, received report from team, assessed the patient and then meet at the patient's bedside along with his wife/Bryan  Parker and daughter/Bryan Parker to discuss diagnosis, prognosis, GOC, EOL wishes disposition and options. Concept of  Palliative Care was discussed.  Created space and opportunity for family to share thoughts and feeling regarding their loved ones current medical situation.    Both wife and daughter verbalize that Bryan Parker  "would not want this".    We discussed the concept of mortality and the limitations of medical interventions to prolong quality of life when the body begins to fail to thrive.  The difference between a aggressive medical intervention path  and a  palliative comfort care path for this patient at this time was had.  Values and goals of care important to patient and family were attempted to be elicited.  Natural trajectory and expectations at EOL were discussed.  Questions and concerns addressed.   Family encouraged to call with questions or concerns.    PMT will continue to support holistically.    NEXT OF KIN/wife    SUMMARY OF RECOMMENDATIONS   -Family has made decision to liberate patient from the vent, DC life prolonging measures and shift focus of care to comfort and dignity.  Code Status/Advance Care Planning:  DNR- documented today by CCM/Dr Icard   Symptom Management:   Dyspnea/Pain: Morphine drip and titrate for comfort  Anxiety/agitation: Versed 1-2 mg IV every 1 as needed   Terminal secretions: Robinul 0.4 mg IV every 6 hours as needed  Palliative Prophylaxis:   Frequent Pain Assessment and Oral Care  Additional Recommendations (Limitations, Scope, Preferences):  Liberate patient from vent and all life prolonging measures.  Focus of care is on comfort and dignity.   Allow a natural death.   Full Comfort Care  Psycho-social/Spiritual:   Desire for further Chaplaincy support:yes- Spiritual care already engaged  Additional Recommendations: Grief/Bereavement Support   Emotional support  and quiet presence  with family throughout the afternoon.  Wife and daughter sitting at bedside together  Prognosis:   Hours - Days    Discussed with family prognosis and exact timeline cannot be made but ensured them that every measure will be made to ensure the patient's comfort  Discharge Planning: Anticipated Hospital Death     Primary Diagnoses: Present on Admission: . Cardiac arrest ([place]) . CKD (chronic kidney disease) . RVF (right ventricular failure) ([place])   I have reviewed the medical record, interviewed the patient and family, and examined the patient. The following aspects are pertinent.  Past  Medical History:  Diagnosis Date  . ARF (acute renal failure) ([place]) 06/06/2018  . Hypertension   . RVF (right ventricular failure) ([place]) 06/06/2018  . Vitamin D deficiency    Social History   Socioeconomic History  . Marital status: Married    Spouse name: Not on file  . Number of children: Not on file  . Years of education: Not on file  . Highest education level: Not on file  Occupational History  . Not on file  Social Needs  . Financial resource strain: Not on file  . Food insecurity:    Worry: Not on file    Inability: Not on file  . Transportation needs:    Medical: Not on file    Non-medical: Not on file  Tobacco Use  . Smoking status: Former Research scientist (life sciences)  . Smokeless tobacco: Never Used  Substance and Sexual Activity  . Alcohol use: No  . Drug use: No  . Sexual activity: Not on file  Lifestyle  . Physical activity:    Days per week: Not on file    Minutes per session: Not on file  . Stress: Not on file  Relationships  . Social connections:    Talks on phone: Not on file    Gets together: Not on file    Attends religious service: Not on file    Active member of club or organization: Not on file    Attends meetings of clubs or organizations: Not on file    Relationship status: Not on file  Other Topics Concern  . Not on file  Social History Narrative   Married. Has 1 adult daughter from previous relationship.   Family History  Problem Relation Age of Onset  . Deep vein thrombosis Sister   . Pulmonary embolism Sister   . Heart attack Brother 78  . Heart disease Brother    Scheduled Meds: . sodium chloride   Intravenous Once  . chlorhexidine gluconate (MEDLINE KIT)  15 mL Mouth Rinse BID  . Chlorhexidine Gluconate Cloth  6 each Topical Q0600  . clonazepam  1 mg Per Tube BID  . darbepoetin (ARANESP) injection - NON-DIALYSIS  200 mcg Subcutaneous Q Sun-1800  . insulin aspart  0-9 Units Subcutaneous Q4H  . insulin aspart  5 Units Subcutaneous Q6H  . mouth  rinse  15 mL Mouth Rinse 10 times per day  . pantoprazole sodium  40 mg Per Tube Q1200  . QUEtiapine  200 mg Per Tube BID  . sodium chloride flush  10-40 mL Intracatheter Q12H   Continuous Infusions: . sodium chloride 1,000 mL (06/19/18 1701)  . sodium chloride 10 mL/hr at 06/27/18 1101  . feeding supplement (VITAL AF 1.2 CAL) 60 mL/hr at 06/27/18 1100  . heparin 10,000 units/ 20 mL infusion syringe    . heparin 1,400 Units/hr (06/27/18 1100)  . heparin    .  norepinephrine (LEVOPHED) Adult infusion Stopped (06/23/18 1000)  . piperacillin-tazobactam (ZOSYN)  IV 3.375 g (06/27/18 1104)  . prismasol BGK 0/2.5 300 mL/hr at 06/27/18 0603  . prismasol BGK 0/2.5 400 mL/hr at 06/27/18 0122  . prismasol BGK 4/2.5 1,500 mL/hr at 06/27/18 0859  . sodium chloride    . sodium chloride     PRN Meds:.sodium chloride, acetaminophen **OR** acetaminophen (TYLENOL) oral liquid 160 mg/5 mL, artificial tears, fentaNYL (SUBLIMAZE) injection, Gerhardt's butt cream, heparin, heparin, heparin, Influenza vac split quadrivalent PF, metoprolol tartrate, midazolam, sodium chloride, sodium chloride flush, white petrolatum Medications Prior to Admission:  Prior to Admission medications   Medication Sig Start Date End Date Taking? Authorizing Provider  atorvastatin (LIPITOR) 20 MG tablet Take 20 mg by mouth daily. 04/29/18  Yes [provider]  gabapentin (NEURONTIN) 300 MG capsule Take 300 mg by mouth 2 (two) times daily. 04/24/18  Yes [provider]  losartan (COZAAR) 100 MG tablet Take 100 mg by mouth daily. 04/29/18  Yes [provider]  polyethylene glycol (MIRALAX / GLYCOLAX) packet Take 17 g by mouth every other day.    Yes [provider]  Vitamin D, Ergocalciferol, (DRISDOL) 50000 UNITS CAPS capsule Take 50,000 Units by mouth every Saturday.    Yes [provider]  oxyCODONE-acetaminophen (PERCOCET/ROXICET) 5-325 MG per tablet Take 1-2 tablets by mouth every 3 (three)  hours as needed for moderate pain. Patient not taking: Reported on 05/19/2018 10/23/14   Eugenie Filler, MD  Rivaroxaban (XARELTO STARTER PACK) 15 & 20 MG TBPK Take as directed on package: Start with one '15mg'$  tablet by mouth twice a day with food. On Day 22, switch to one '20mg'$  tablet once a day with food. Take 15 mg 2 times daily through 11/09/14, then 20 mg daily starting 11/10/14. Patient not taking: Reported on 05/24/2018 10/23/14   Eugenie Filler, MD  rivaroxaban (XARELTO) 20 MG TABS tablet Take 1 tablet (20 mg total) by mouth daily with supper. Patient not taking: Reported on 05/27/2018 11/10/14   Eugenie Filler, MD   No Known Allergies Review of Systems  Unable to perform ROS: Acuity of condition    Physical Exam  Constitutional: He appears cachectic. He is intubated.  Cardiovascular: Tachycardia present.  Pulmonary/Chest: He is intubated.  Skin: Skin is warm and dry.    Vital Signs: BP (!) 90/56   Pulse (!) 122   Temp 99.2 F (37.3 C) (Oral)   Resp (!) 34   Ht '5\' 10"'$  (1.778 m)   Wt 61.6 kg   SpO2 100%   BMI 19.49 kg/m  Pain Scale: CPOT POSS *See Group Information*: 1-Acceptable,Awake and alert Pain Score: Asleep(not coughing anymore)   SpO2: SpO2: 100 % O2 Device:SpO2: 100 % O2 Flow Rate: .   IO: Intake/output summary:   Intake/Output Summary (Last 24 hours) at 06/27/2018 1207 Last data filed at 06/27/2018 1100 Gross per 24 hour  Intake 2108.21 ml  Output 3122 ml  Net -1013.79 ml    LBM: Last BM Date: 06/27/18 Baseline Weight: Weight: 62.1 kg Most recent weight: Weight: 61.6 kg     Palliative Assessment/Data:   Discussed with Dr Valeta Harms and Dr Coladanado/yesterday  Time In: 1000 Time Out: 1115 Time Total: 75 minutes Greater than 50%  of this time was spent counseling and coordinating care related to the above assessment and plan.  Signed by: Bryan Lessen, NP   Please contact Palliative Medicine Team phone at [phone] for questions and  concerns.  For individual provider: See Shea Evans

## 2018-06-27 NOTE — Progress Notes (Signed)
ANTICOAGULATION CONSULT NOTE   Pharmacy Consult for Heparin Indication: suspected pulmonary embolus and confirmed DVT  No Known Allergies  Patient Measurements: Height: 5\' 10"  (177.8 cm) Weight: 135 lb 12.9 oz (61.6 kg) IBW/kg (Calculated) : 73  Vital Signs: Temp: 99.9 F (37.7 C) (11/11 0400) Temp Source: Oral (11/11 0400) BP: 109/68 (11/11 0600) Pulse Rate: 128 (11/11 0600)  Labs: Recent Labs    06/25/18 0400  06/26/18 0446 06/26/18 1551 06/27/18 0533  HGB 7.9*  --  8.0*  --   --   HCT 24.3*  --  25.8*  --   --   PLT 575*  --  679*  --   --   APTT  --   --  123*  --  76*  HEPARINUNFRC  --    < > 0.88* 0.65 0.46  CREATININE  --    < > 3.79* 2.90* 2.47*   < > = values in this interval not displayed.    Estimated Creatinine Clearance: 23.9 mL/min (A) (by C-G formula based on SCr of 2.47 mg/dL (H)).  Assessment: 72yo male with DVT and likely PE s/p PEA arrest and TNKase 10/20 for heparin. Currently on CRRT. Trach site bleeding now resolved and heparin was restarted.  Heparin level therapeutic at 0.46 on heparin 1400 units/hr. No signs/symptoms of bleeding or issues with infusion reported by nursing.   Goal of Therapy:  Heparin level 0.3-0.5 units/ml Monitor platelets by anticoagulation protocol: Yes   Plan:  Continue heparin infusion at 1400 units/hr. Recheck heparin level and CBC with morning labs   Marcelino Freestone, PharmD PGY2 Cardiology Pharmacy Resident Phone [phone] Please check AMION for all Pharmacist numbers by unit 06/27/2018 6:56 AM

## 2018-06-27 NOTE — Consult Note (Signed)
WOC Nurse wound follow up Wound type: Bilateral heel pressure injuries Lip/mucosal membrane pressure injuries related to ETT Perirectal pressure injury related to FMS Measurement: Right heel: 3.5cm x 4.0cm x 0cm 100% eschar, hard stable, skin intact  Left heel: 1.0cm x 2.5cm x 0cm 100% dark purple non blanchable tissue, skin intact  Lip: healed Discussed perirectal wound with bedside nurse, she reports partial thickness skin loss and linear area in the apex of the gluteal cleft. Using barrier cream and foam to protect sites.  Wound bed: see above  Drainage (amount, consistency, odor) none Periwound: intact, LE with wounds related to edema Dressing procedure/placement/frequency: Patient in PRAFO boots, stressed importance of floating heels in these types of boots to the staff and the patient's family. No topical care at this time for the heels, they are stable.   WOC Nurse team will follow with you and see patient within 10 days for wound assessments.  Please notify WOC nurses of any acute changes in the wounds or any new areas of concern Deniya Craigo [facility] MSN, RN,CWOCN, CNS, CWON-AP [phone]

## 2018-06-27 NOTE — Progress Notes (Addendum)
Nutrition Follow Up  DOCUMENTATION CODES:   Not applicable  INTERVENTION:    Continue Vital AF 1.2 at goal rate of 60 ml/h (1440 ml per day)   Provides 1728 kcals, 108 gm protein, 1167 ml of free water daily  NUTRITION DIAGNOSIS:   Inadequate oral intake related to inability to eat as evidenced by NPO status, ongoing  GOAL:   Patient will meet greater than or equal to 90% of their needs, met  MONITOR:   Vent status, TF tolerance, Labs, Skin, Weight trends, I & O's  ASSESSMENT:   72 y.o. male with hx of HTN, CKD, lg PE 2016 and no longer anticoagulated. He was admitted 10/20 after PEA cardiac arrest. Coded repeatedly in the ER. High suspicion of PE, TNKase given in ED.    10/23 CVVHD started 11/02 s/p trach, Cortak placed  Patient is currently on ventilator support MV: 18.5 L/min Temp (24hrs), Avg:99.4 F (37.4 C), Min:98.7 F (37.1 C), Max:99.9 F (37.7 C)  Vital AF 1.2 infusing at goal rate of 60 ml/hr via Cortrak tube. PCCM note 11/11 reviewed. Pt needs prolonged weaning trials. Acute on CKD. Continues on CVVHD but off pressors.  Planned tunneled catheter placement per IR. Labs & medications reviewed. Mg 2.7 (H). CBG's [id number].  Diet Order:   Diet Order            Diet NPO time specified  Diet effective midnight             EDUCATION NEEDS:   Not appropriate for education at this time  Skin:    Bilateral heel pressure injuries Lip/mucosal membrane pressure injuries related to ETT Perirectal pressure injury related to [place]  Last BM:  11/11    Intake/Output Summary (Last 24 hours) at 06/27/2018 1612 Last data filed at 06/27/2018 1500 Gross per 24 hour  Intake 2041.39 ml  Output 3082 ml  Net -1040.61 ml   Height:   Ht Readings from Last 1 Encounters:  06/20/18 _0  (1.778 m)   Weight:   Wt Readings from Last 1 Encounters:  06/27/18 61.6 kg  06/06/2018          62.1 kg  Ideal Body Weight:  75.45 kg  BMI:  Body mass index is  19.49 kg/m.  Estimated Nutritional Needs:  Kcal:  1700-1800  Protein:  95-110 gm  Fluid:  per MD  Arthur Holms, RD, LDN Pager #: [phone] After-Hours Pager #: [phone]

## 2018-06-27 NOTE — Progress Notes (Signed)
NAME:  Bryan Parker, MRN:  [id number], DOB:  1946-06-23, LOS: 22 ADMISSION DATE:  05/29/2018, CONSULTATION DATE:  05/28/2018 REFERRING MD:  Dr. Rhunette Croft , CHIEF COMPLAINT:  Cardiac Arrest    Brief History    72 year old man admitted 10/20 post cardiac arrest with PEA received TNKase in the EDfor presumed PEdue to bedside echo showing dilated RV. He has a history of prior PE and anticoagulation was discontinued in 2018. Duplex showed left popliteal DVT He wasin severe cor pulmonale on high-dose epinephrine, Levophed, vasopressin and dobutamine.  Course complicated by multiorgan failure requiringCRRT and prolonged ventilationrequiring tracheostomy on 11/2.  Past Medical History  H/O DVT/PE, CKD  Significant Hospital Events   10/20 > Presents to ED  1021 with a partial code no CPR 10/22 bleeding from IV sites and foley, heparin d/c'd for 2 hours then resumed. Hgb 6.8 > 1u PRBC transfused 10/23 CRRT started.  Giapreza added. 10/24 coude catheter placed by urology and CBI started. 10/25 - Pressor requirements much improved after giaprezza added 10/23; however, remains on vaso0.03, NE 23, epi 10, dobut 20, giapreza 25. CK up from 14.5 on 10/24 up to 35K on 10/25.  CRRT running.  10/26 - CK 31,000 (was 34,000 yesterday) . Volume removal continues. Fluid challenge strategy cancelled. CK better. Legs thinner with volume removal. Pressors: off giaprezza, levophed 6, dobutamine 10, epi 16 and vaso 0.03. Had vomit yesterday and TF on hold. Has bloody urine - seen by urology  10/27  - pressor needs coming down - off epi gtt. Dobutamine 10, Vaso 0.03 and levophed 4-35mcg/min. . Still largely volume . Volume removal via CRRT. CK down to 24K. TF still on hold.  10/28 brady , hypotensive with precedex 11/2>> Trach 11/3 bleeding around trach site - packed , heparin held   Consults: date of consult/date signed off & final recs:  PCCM 10/20 Heart failure 10/21 >  Urology 10/24 >   renal  Procedures (surgical and bedside):  ETT 10/20 >>11/2 Trach (JY) 11/2>>> Right Femoral CVC 10/20 >> 10/29 Left Femoral Aline 10/20 >> 10/30 R IJ HD cath 10/23 > 11/4 Rt radial a line 10/30 >> out L IJ HD cath 11/4 >>  Significant Diagnostic Tests:  CT Head 10/20 >> neg Echo 10/20 > EF 55 - 60%, severely dilated RV, mod dilated RA, mod TR. CT chest/abd/ pelvis 10/30  >> no bleed CT head 10/30 neg  Micro Data:  Blood 10/20 >> neg Sputum 10/20 >> neg U/A 10/20 >> neg Blood 10/29 > ng resp 10/29  >>ng Sputum 10/29>> GS few gram +>>  Few candida albicans Blood 10/29>> ng Blood 11/8 >> ng  c diff 11/8 neg resp 11/3, 11/8  >> ng  Antimicrobials:  zosyn 10/20 >> 11/1 , 11/3 >> vanc 10/20 >>off, 10/29 >>11/6, 11/8 >> 11/10  SUBJECTIVE/OVERNIGHT/INTERVAL HX   Critically ill.  Hypotensive at times.  Tracheostomy dependent is able to look around the room.  Not following commands.  Possibly delirious/ongoing encephalopathy.  Objective   Blood pressure (!) 79/59, pulse (!) 118, temperature 98.7 F (37.1 C), temperature source Oral, resp. rate (!) 25, height 5\' 10"  (1.778 m), weight 61.6 kg, SpO2 100 %.    Vent Mode: PRVC FiO2 (%):  [30 %-35 %] 30 % Set Rate:  [14 bmp] 14 bmp Vt Set:  [500 mL] 500 mL PEEP:  [5 cmH20] 5 cmH20 Plateau Pressure:  [16 cmH20-22 cmH20] 22 cmH20   Intake/Output Summary (Last 24 hours) at 06/27/2018 1223 Last  data filed at 06/27/2018 1200 Gross per 24 hour  Intake 2194.03 ml  Output 3277 ml  Net -1082.97 ml   Filed Weights   06/25/18 0500 06/26/18 0500 06/27/18 0500  Weight: 65.9 kg 65.1 kg 61.6 kg    General appearance: 72 y.o., male, tracheostomy tube in place Eyes: anicteric sclerae, tracks HENT: NCAT Neck: Trachea midline; no JVD, left IJ CVC Lungs: Lateral ventilated breath sounds, anterior rhonchi, minimal secretions CV: Tachycardic, regular S1-S2 Abdomen: Soft, non-tender; non-distended, BS present  Extremities: Boots in  place for contracture prevention, legs wrapped Skin: Normal temperature, turgor and texture; no rash Psych: Possible ongoing delirium Neuro: Moves all 4 extremities spontaneously but without purpose    Assessment & Plan:   Acute on chronic hypoxemic hypercarbic respiratory failure status post intubation mechanical ventilation and transition to tracheostomy now with need for prolonged weaning.  Will likely need long-term care with mental support and now dialysis. PLAN - Continue full vent support.  Still very weak and tachypneic -Left basilar atelectasis, small effusion Chest x-ray 06/25/2018:The patient's images have been independently reviewed by me.    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 - Continue Precedex and PRN fentanyl -Continue current Seroquel dosing.  Does seem high dose however has been tolerating per staff. -Continue clonazepam  PEA Cardiac Arrest - concern due to massive PE Status post TPA in the emergency room for presumed massive PE -History of DVT PE no longer on anticoagulation -Continue heparin drip -Pharmacy to help titrate heparin for PTT  Fever in the ICU Unclear etiology at this time.  Possibly medication induced.  No clear source Does have sacral and lower extremity stage II wounds Procalcitonin remains elevated but is reducing. Plan: -Continue on piperacillin tazobactam -Does have plans for dialysis catheter removal once tunneled line is in place.  Acute renal failure on chronic renal failure Rhabdomyolysis on admission Hyponatremia Now with continued need for CRRT, concern for prolonged need of IHD -Appreciate nephrology recommendations -Appreciate interventional radiology recognitions regarding tunnel catheter placement   Acute blood loss anemia Did have bleeding around tracheostomy site.  This has improved PLAN Transfuse hemoglobin to maintain greater than 7. Prefer  conservative strategy for transfusion  Urethral bleeding - likely traumatic following tPA administration. -Improved  Hyperglycemia. -SSI  Protein calorie malnutrition PLAN Continue tube feeds at goal  Resolved problems : Septic shock Thrombocytopenia. Trach bleed  Disposition / Summary of Today's Plan 06/27/18   Planned tunneled catheter placement by interventional radiology.  Code Status: 06/07/2018 partial code no CPR Family Communication: Wife updated daily  This patient is critically ill with multiple organ system failure; which, requires frequent high complexity decision making, assessment, support, evaluation, and titration of therapies. This was completed through the application of advanced monitoring technologies and extensive interpretation of multiple databases.  Long discussion with daughter at bedside regarding goals of care.  Daughter is a long-term care facility nurse. During this encounter critical care time was devoted to patient care services described in this note for 36 minutes.   [name], DO [facility] 06/27/2018 12:23 PM  Personal pager: [phone] If unanswered, please page CCM On-call: #[phone]

## 2018-06-27 NOTE — Consult Note (Signed)
Chief Complaint: Patient was seen in consultation today for tunneled catheter placement Chief Complaint  Patient presents with  . Cardiac Arrest   at the request of Dr Hansel Feinstein  Supervising Physician: Ruel Favors  Patient Status: [facility] - In-pt  History of Present Illness: Bryan Parker is a 72 y.o. male   Post 10/20 PEA/cardiac arrest Presumed PE Has had previous PE-- AC stopped 2018 Trach/vent Worsening renal failure Multi organ failure  Temp cath placed Left IJ 06/20/18- CCM  Acute kidney injury/chronic renal failure Rhabdomyolysis Hypotension/shock  Dr Arrie Aran asking for tunneled catheter placement Dr Vassie Loll note does reflect need for perm cath soon  Wbc 24.9 99.2 No steroids  Will recheck labs over next 24-48 hrs Plan for placement when wbc coming down  Past Medical History:  Diagnosis Date  . ARF (acute renal failure) (HCC) 06/06/2018  . Hypertension   . RVF (right ventricular failure) (HCC) 06/06/2018  . Vitamin D deficiency     Past Surgical History:  Procedure Laterality Date  . PROSTATE SURGERY    . SPINE SURGERY      Allergies: Patient has no known allergies.  Medications: Prior to Admission medications   Medication Sig Start Date End Date Taking? Authorizing Provider  atorvastatin (LIPITOR) 20 MG tablet Take 20 mg by mouth daily. 04/29/18  Yes [provider]  gabapentin (NEURONTIN) 300 MG capsule Take 300 mg by mouth 2 (two) times daily. 04/24/18  Yes [provider]  losartan (COZAAR) 100 MG tablet Take 100 mg by mouth daily. 04/29/18  Yes [provider]  polyethylene glycol (MIRALAX / GLYCOLAX) packet Take 17 g by mouth every other day.    Yes [provider]  Vitamin D, Ergocalciferol, (DRISDOL) 50000 UNITS CAPS capsule Take 50,000 Units by mouth every Saturday.    Yes [provider]  oxyCODONE-acetaminophen (PERCOCET/ROXICET) 5-325 MG per tablet Take 1-2 tablets by mouth every 3  (three) hours as needed for moderate pain. Patient not taking: Reported on 06-10-2018 10/23/14   Rodolph Bong, MD  Rivaroxaban (XARELTO STARTER PACK) 15 & 20 MG TBPK Take as directed on package: Start with one 15mg  tablet by mouth twice a day with food. On Day 22, switch to one 20mg  tablet once a day with food. Take 15 mg 2 times daily through 11/09/14, then 20 mg daily starting 11/10/14. Patient not taking: Reported on June 10, 2018 10/23/14   Rodolph Bong, MD  rivaroxaban (XARELTO) 20 MG TABS tablet Take 1 tablet (20 mg total) by mouth daily with supper. Patient not taking: Reported on 06-10-2018 11/10/14   Rodolph Bong, MD     Family History  Problem Relation Age of Onset  . Deep vein thrombosis Sister   . Pulmonary embolism Sister   . Heart attack Brother 50  . Heart disease Brother     Social History   Socioeconomic History  . Marital status: Married    Spouse name: Not on file  . Number of children: Not on file  . Years of education: Not on file  . Highest education level: Not on file  Occupational History  . Not on file  Social Needs  . Financial resource strain: Not on file  . Food insecurity:    Worry: Not on file    Inability: Not on file  . Transportation needs:    Medical: Not on file    Non-medical: Not on file  Tobacco Use  . Smoking status: Former Games developer  . Smokeless tobacco:  Never Used  Substance and Sexual Activity  . Alcohol use: No  . Drug use: No  . Sexual activity: Not on file  Lifestyle  . Physical activity:    Days per week: Not on file    Minutes per session: Not on file  . Stress: Not on file  Relationships  . Social connections:    Talks on phone: Not on file    Gets together: Not on file    Attends religious service: Not on file    Active member of club or organization: Not on file    Attends meetings of clubs or organizations: Not on file    Relationship status: Not on file  Other Topics Concern  . Not on file  Social  History Narrative   Married. Has 1 adult daughter from previous relationship.    Review of Systems: A 12 point ROS discussed and pertinent positives are indicated in the HPI above.  All other systems are negative.  Review of Systems  Respiratory:       Vent    Vital Signs: BP (!) 90/56   Pulse (!) 122   Temp 99.2 F (37.3 C) (Oral)   Resp (!) 34   Ht 5\' 10"  (1.778 m)   Wt 135 lb 12.9 oz (61.6 kg)   SpO2 100%   BMI 19.49 kg/m   Physical Exam  Cardiovascular: Normal rate.  Pulmonary/Chest:  vent  Musculoskeletal:  Does not follow commands  Skin: Skin is warm.  Psychiatric:  Daughter at bedside Consented for procedure  Vitals reviewed.   Imaging: Ct Abdomen Pelvis Wo Contrast  Result Date: 06/15/2018 CLINICAL DATA:  Pneumonia, hematuria. EXAM: CT CHEST, ABDOMEN AND PELVIS WITHOUT CONTRAST TECHNIQUE: Multidetector CT imaging of the chest, abdomen and pelvis was performed following the standard protocol without IV contrast. COMPARISON:  CT scan of October 16, 2014. FINDINGS: CT CHEST FINDINGS Cardiovascular: Atherosclerosis of thoracic aorta is noted without aneurysm formation. Normal cardiac size. No pericardial effusion is noted. Mild coronary artery calcifications are noted. Mediastinum/Nodes: No enlarged mediastinal, hilar, or axillary lymph nodes. Endotracheal and nasogastric tubes are in grossly good position. Lungs/Pleura: No pneumothorax is noted. Mild bilateral pleural effusions are noted with adjacent subsegmental atelectasis. 7 mm subpleural nodule is seen in superior segment of left lower lobe best seen on image number 94 series 4. Musculoskeletal: No chest wall mass or suspicious bone lesions identified. CT ABDOMEN PELVIS FINDINGS Hepatobiliary: No focal liver abnormality is seen. No gallstones, gallbladder wall thickening, or biliary dilatation. Probable sludge is noted within the gallbladder lumen. Pancreas: Unremarkable. No pancreatic ductal dilatation or surrounding  inflammatory changes. Spleen: Normal in size without focal abnormality. Adrenals/Urinary Tract: Adrenal glands and kidneys appear normal. No hydronephrosis or renal obstruction is noted. No renal or ureteral calculi are noted. Urinary bladder is decompressed secondary to Foley catheter. Stomach/Bowel: Stomach is within normal limits. Appendix appears normal. No evidence of bowel wall thickening, distention, or inflammatory changes. Rectal tube is present. Vascular/Lymphatic: Aortic atherosclerosis. No enlarged abdominal or pelvic lymph nodes. Reproductive: Prostate is unremarkable. Other: Mild anasarca is noted.  No hernia is noted. Musculoskeletal: No acute or significant osseous findings. IMPRESSION: Endotracheal and nasogastric tubes in grossly good position. Mild bilateral pleural effusions are noted with adjacent subsegmental atelectasis. 7 mm subpleural nodule seen in superior segment of left lower lobe. Non-contrast chest CT at 6-12 months is recommended. If the nodule is stable at time of repeat CT, then future CT at 18-24 months (from today's scan) is  considered optional for low-risk patients, but is recommended for high-risk patients. This recommendation follows the consensus statement: Guidelines for Management of Incidental Pulmonary Nodules Detected on CT Images: From the Fleischner Society 2017; Radiology 2017; 284:228-243. Probable sludge seen within gallbladder. Mild anasarca. Aortic Atherosclerosis (ICD10-I70.0). Electronically Signed   By: Lupita Raider, M.D.   On: 06/15/2018 13:59   Dg Abd 1 View  Result Date: 06/11/2018 CLINICAL DATA:  Vomiting. EXAM: ABDOMEN - 1 VIEW COMPARISON:  None. FINDINGS: A right femoral line is in good position. The distal end of the NG tube is somewhat obscured by overlapping wires but appears to terminate in the distal stomach. No other acute abnormalities. IMPRESSION: Support apparatus as above.  No acute interval change noted. Electronically Signed   By: Gerome Sam III M.D   On: 06/11/2018 12:21   Ct Head Wo Contrast  Result Date: 06/18/2018 CLINICAL DATA:  Focal neuro deficit.  Pupil change. EXAM: CT HEAD WITHOUT CONTRAST TECHNIQUE: Contiguous axial images were obtained from the base of the skull through the vertex without intravenous contrast. COMPARISON:  CT head 06/15/2018 FINDINGS: Brain: Mild atrophy. Negative for acute infarct, hemorrhage, or mass. Vascular: Negative for hyperdense vessel Skull: Negative Sinuses/Orbits: Mild mucosal edema paranasal sinuses.  Normal orbit Other: None IMPRESSION: No acute abnormality.  No change from the recent study. Electronically Signed   By: Marlan Palau M.D.   On: 06/18/2018 21:15   Ct Head Wo Contrast  Result Date: 06/15/2018 CLINICAL DATA:  72 year old male with altered level of consciousness, pneumonia, hematuria EXAM: CT HEAD WITHOUT CONTRAST TECHNIQUE: Contiguous axial images were obtained from the base of the skull through the vertex without intravenous contrast. COMPARISON:  Recent prior head CT 2018/06/30 FINDINGS: Brain: No evidence of acute infarction, hemorrhage, hydrocephalus, extra-axial collection or mass lesion/mass effect. Stable mild atrophy. Vascular: No hyperdense vessel or unexpected calcification. Bilateral cavernous carotid artery calcifications. Skull: Normal. Negative for fracture or focal lesion. Sinuses/Orbits: Partial bilateral mastoid sinuses are effusions. Incompletely imaged mucous retention cysts versus polyps in the inferior maxillary sinuses. Other: None IMPRESSION: 1. No acute intracranial abnormality. 2. Stable mild cortical cerebral atrophy. 3. Mild bilateral partial mastoid effusions. Electronically Signed   By: Malachy Moan M.D.   On: 06/15/2018 13:24   Ct Head Wo Contrast  Result Date: 30-Jun-2018 CLINICAL DATA:  Altered level of consciousness. Status post cardiac arrest. EXAM: CT HEAD WITHOUT CONTRAST TECHNIQUE: Contiguous axial images were obtained from the base  of the skull through the vertex without intravenous contrast. COMPARISON:  None. FINDINGS: Brain: No acute infarct, hemorrhage, or mass lesion is present. Minimal white matter changes are within normal limits for age. Basal ganglia are intact. Insert pass ventricles No significant extraaxial fluid collection is present. The brainstem and cerebellum are within normal limits. Vascular: Atherosclerotic calcifications are present within the cavernous internal carotid arteries bilaterally. There is no hyperdense vessel. Skull: Calvarium is intact. No focal lytic or blastic lesions are present. Sinuses/Orbits: There is mild mucosal thickening along the floor of the maxillary sinuses bilaterally. The remaining paranasal sinuses and the mastoid air cells are clear. Globes and orbits are within normal limits. IMPRESSION: Normal CT of the head for age. Electronically Signed   By: Marin Roberts M.D.   On: 2018-06-30 08:12   Ct Chest Wo Contrast  Result Date: 06/15/2018 CLINICAL DATA:  Pneumonia, hematuria. EXAM: CT CHEST, ABDOMEN AND PELVIS WITHOUT CONTRAST TECHNIQUE: Multidetector CT imaging of the chest, abdomen and pelvis was performed following the standard  protocol without IV contrast. COMPARISON:  CT scan of October 16, 2014. FINDINGS: CT CHEST FINDINGS Cardiovascular: Atherosclerosis of thoracic aorta is noted without aneurysm formation. Normal cardiac size. No pericardial effusion is noted. Mild coronary artery calcifications are noted. Mediastinum/Nodes: No enlarged mediastinal, hilar, or axillary lymph nodes. Endotracheal and nasogastric tubes are in grossly good position. Lungs/Pleura: No pneumothorax is noted. Mild bilateral pleural effusions are noted with adjacent subsegmental atelectasis. 7 mm subpleural nodule is seen in superior segment of left lower lobe best seen on image number 94 series 4. Musculoskeletal: No chest wall mass or suspicious bone lesions identified. CT ABDOMEN PELVIS FINDINGS  Hepatobiliary: No focal liver abnormality is seen. No gallstones, gallbladder wall thickening, or biliary dilatation. Probable sludge is noted within the gallbladder lumen. Pancreas: Unremarkable. No pancreatic ductal dilatation or surrounding inflammatory changes. Spleen: Normal in size without focal abnormality. Adrenals/Urinary Tract: Adrenal glands and kidneys appear normal. No hydronephrosis or renal obstruction is noted. No renal or ureteral calculi are noted. Urinary bladder is decompressed secondary to Foley catheter. Stomach/Bowel: Stomach is within normal limits. Appendix appears normal. No evidence of bowel wall thickening, distention, or inflammatory changes. Rectal tube is present. Vascular/Lymphatic: Aortic atherosclerosis. No enlarged abdominal or pelvic lymph nodes. Reproductive: Prostate is unremarkable. Other: Mild anasarca is noted.  No hernia is noted. Musculoskeletal: No acute or significant osseous findings. IMPRESSION: Endotracheal and nasogastric tubes in grossly good position. Mild bilateral pleural effusions are noted with adjacent subsegmental atelectasis. 7 mm subpleural nodule seen in superior segment of left lower lobe. Non-contrast chest CT at 6-12 months is recommended. If the nodule is stable at time of repeat CT, then future CT at 18-24 months (from today's scan) is considered optional for low-risk patients, but is recommended for high-risk patients. This recommendation follows the consensus statement: Guidelines for Management of Incidental Pulmonary Nodules Detected on CT Images: From the Fleischner Society 2017; Radiology 2017; 284:228-243. Probable sludge seen within gallbladder. Mild anasarca. Aortic Atherosclerosis (ICD10-I70.0). Electronically Signed   By: Lupita Raider, M.D.   On: 06/15/2018 13:59   US Renal  Result Date: 05/22/2018 CLINICAL DATA:  Acute renal failure. EXAM: RENAL / URINARY TRACT ULTRASOUND COMPLETE COMPARISON:  None. FINDINGS: Right Kidney: Length:  9.7 cm. Echogenicity within normal limits. No mass or hydronephrosis visualized. Benign-appearing cyst in the midpole region of the right kidney measures 1.9 cm in diameter. Left Kidney: Length: 10.1 cm. Echogenicity within normal limits. No mass or hydronephrosis visualized. Bladder: Not visualized. Small amount of bilateral perinephric fluid. Abdominal ascites. Bilateral pleural effusions. IMPRESSION: Normal echogenicity of the kidneys. Benign-appearing right renal cyst. Small amount of bilateral perinephric fluid. Small volume of abdominal ascites. Small bilateral pleural effusions. Electronically Signed   By: Ted Mcalpine M.D.   On: 05/20/2018 17:47   Dg Chest Port 1 View  Result Date: 06/25/2018 CLINICAL DATA:  Respiratory distress. EXAM: PORTABLE CHEST 1 VIEW COMPARISON:  06/24/2018 FINDINGS: The tracheostomy tube is stable. Stable left IJ central venous catheter. Feeding tube is coursing down the esophagus and into the stomach. The heart is normal in size. The mediastinal and hilar contours are normal. Stable underlying emphysematous changes and pulmonary scarring. Persistent streaky left basilar atelectasis and probable small effusion. IMPRESSION: 1. Stable support apparatus. 2. Chronic underlying emphysematous changes. 3. Minimal left basilar atelectasis and small left effusion. Electronically Signed   By: Rudie Meyer M.D.   On: 06/25/2018 02:51   Dg Chest Port 1 View  Result Date: 06/24/2018 CLINICAL DATA:  72 year old male  with shortness of breath. PEA arrest. EXAM: PORTABLE CHEST 1 VIEW COMPARISON:  06/20/2018 and earlier. FINDINGS: Portable AP semi upright view at 0748 hours. Stable tracheostomy and left dual lumen dialysis type catheter. Right IJ single-lumen catheter removed. Stable visible enteric tube, tip not included. Mildly lower lung volumes. Mild increased interstitial markings, and crowding of markings at the left base. No pneumothorax, pleural effusion or consolidation.  Normal cardiac size and mediastinal contours. Negative visible bowel gas pattern. IMPRESSION: 1. Right IJ single-lumen catheter removed. Otherwise, stable lines and tubes. 2. Mildly lower lung volumes with mild increased vascular congestion and left base atelectasis. Electronically Signed   By: Odessa Fleming M.D.   On: 06/24/2018 09:38   Dg Chest Port 1 View  Result Date: 06/20/2018 CLINICAL DATA:  Left-sided hemodialysis catheter placement. EXAM: PORTABLE CHEST 1 VIEW COMPARISON:  06/19/2018 FINDINGS: Right IJ central venous catheter and tracheostomy tube unchanged. Enteric tube courses into the region of the stomach and off the inferior portion of the film as tip is not visualized. Interval placement of left IJ dialysis catheter with tip over the SVC. Lungs are adequately inflated with subtle linear bibasilar density likely atelectasis. No pneumothorax. Cardiomediastinal silhouette and remainder of the exam is unchanged. IMPRESSION: Subtle bibasilar linear atelectasis. Tubes and lines as described. Electronically Signed   By: Elberta Fortis M.D.   On: 06/20/2018 12:47   Dg Chest Port 1 View  Result Date: 06/19/2018 CLINICAL DATA:  Respiratory failure. EXAM: PORTABLE CHEST 1 VIEW COMPARISON:  06/18/2018 FINDINGS: There has been a placement of feeding catheter, descending below the diaphragm, tip collimated off the image. Right internal jugular approach venous catheter sheath and tracheostomy tube are unchanged. Cardiomediastinal silhouette is normal. Mediastinal contours appear intact. There is no evidence of lobar airspace consolidation, pleural effusion or pneumothorax. Minimal peribronchial airspace consolidation versus atelectasis the lung bases. Osseous structures are without acute abnormality. Soft tissues are grossly normal. IMPRESSION: Minimal peribronchial airspace consolidation versus atelectasis in the lung bases. Electronically Signed   By: Ted Mcalpine M.D.   On: 06/19/2018 08:00   Dg Chest  Port 1 View  Result Date: 06/18/2018 CLINICAL DATA:  Tracheostomy tube change. EXAM: PORTABLE CHEST 1 VIEW COMPARISON:  Chest x-ray dated June 16, 2018. FINDINGS: New tracheostomy tube in good position with the tip at the thoracic inlet. Unchanged right internal jugular central venous catheter. Interval removal of the enteric tube. The heart size and mediastinal contours are within normal limits. Normal pulmonary vascularity. Improving aeration of the left lower lobe. Unchanged small left pleural effusion. No pneumothorax. No acute osseous abnormality. IMPRESSION: 1. Tracheostomy tube in good position. 2. Slightly improved aeration in the left lower lobe. Unchanged small left pleural effusion. Electronically Signed   By: Obie Dredge M.D.   On: 06/18/2018 15:38   Dg Chest Port 1 View  Result Date: 06/16/2018 CLINICAL DATA:  Respiratory failure EXAM: PORTABLE CHEST 1 VIEW COMPARISON:  06/15/2018 FINDINGS: Endotracheal tube, nasogastric catheter and esophageal probe are again noted and stable. Right jugular temporary dialysis catheter is seen. The cardiac shadow is stable. Slight increased atelectasis is noted in the left base. No pneumothorax is seen. No sizable effusion is noted. IMPRESSION: Slight increase in left basilar atelectasis. Tubes and lines as described. Electronically Signed   By: Alcide Clever M.D.   On: 06/16/2018 08:53   Dg Chest Port 1 View  Result Date: 06/15/2018 CLINICAL DATA:  Respiratory failure.  Intubated patient. EXAM: PORTABLE CHEST 1 VIEW COMPARISON:  06/14/2018 and  06/13/2018. FINDINGS: 0556 hours. Endotracheal tube, enteric tube and right IJ central venous catheter appear unchanged in position. There is a possible mid esophageal temperature probe. The heart size and mediastinal contours are stable. There is stable mild left-greater-than-right basilar atelectasis. No pneumothorax or significant pleural effusion. IMPRESSION: Stable bibasilar atelectasis and support system.   No acute findings. Electronically Signed   By: Carey Bullocks M.D.   On: 06/15/2018 10:50   Dg Chest Port 1 View  Result Date: 06/14/2018 CLINICAL DATA:  Acute respiratory failure. EXAM: PORTABLE CHEST 1 VIEW COMPARISON:  Radiograph of June 13, 2018. FINDINGS: The heart size and mediastinal contours are within normal limits. Stable position of endotracheal and nasogastric tubes. Right internal jugular catheter is unchanged. No pneumothorax is noted. Mild bibasilar subsegmental atelectasis is noted. The visualized skeletal structures are unremarkable. IMPRESSION: Stable support apparatus.  Mild bibasilar subsegmental atelectasis. Electronically Signed   By: Lupita Raider, M.D.   On: 06/14/2018 09:39   Dg Chest Port 1 View  Result Date: 06/13/2018 CLINICAL DATA:  Respiratory failure, intubated patient. History of acute renal failure and right heart failure. 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 EXAM: PORTABLE CHEST 1 VIEW COMPARISON:  Portable chest x-ray of June 12, 2018 FINDINGS: The lungs are reasonably well inflated. The right hemidiaphragm is better demonstrated today. There is persistent obscuration of the left hemidiaphragm with haziness in the retrocardiac region. The heart is normal in size. The pulmonary vascularity is not engorged. The endotracheal tube tip projects approximately 8.5 cm above the carina with the tip at the superior margin of the clavicular heads. The esophagogastric tubes proximal port is at or just below the GE junction with the tip projecting below the inferior margin of the image. The right internal jugular venous catheter tip projects over the proximal SVC. IMPRESSION: High positioning of the endotracheal tube. Correlation clinically as to the type with tube in adequate C of this positioning is needed. Borderline high positioning of the esophagogastric tube. Advancement by 5 cm would assure that the proximal port remains below the GE  junction. Bibasilar atelectasis, stable on the left and improved on the right. Electronically Signed   By: David  [place] M.D.   On: 06/13/2018 08:46   Dg Chest Port 1 View  Result Date: 06/12/2018 CLINICAL DATA:  Intubated patient.  Follow-up exam. EXAM: PORTABLE CHEST 1 VIEW COMPARISON:  06/11/2018 and multiple prior studies. FINDINGS: Endotracheal tube tip projects 6.8 cm above the carinal. Nasal/orogastric tube passes below the diaphragm into the stomach. Right internal jugular central venous line tip projects in the mid to upper superior vena cava. Support apparatus is stable. There is opacity at the right lung base obscuring hemidiaphragm consistent moderate pleural effusion with atelectasis. Milder atelectasis is noted at the left lung base. Allowing for the more erect positioning on the current exam, there has been no significant interval change. No pneumothorax. IMPRESSION: 1. No significant change from the most recent prior study. 2. Support apparatus is stable. 3. Moderate right pleural effusion. By basilar atelectasis. No convincing pulmonary edema. Electronically Signed   By: Amie Portland M.D.   On: 06/12/2018 10:03   Dg Chest Port 1 View  Result Date: 06/11/2018 CLINICAL DATA:  72 year old male with respiratory failure status post PEA arrest. EXAM: PORTABLE CHEST 1 VIEW COMPARISON:  06/10/2018 and earlier. FINDINGS: Portable AP semi upright view at 0500 hours. Stable ET tube tip at the level the clavicles. Stable enteric tube, side hole the level of  the proximal stomach. Stable right IJ central line. Stable lung volumes. Mediastinal contours remain normal. Veiling opacity in both lungs with no pneumothorax. Upper lobe pulmonary vascularity is normal. Stable ventilation. Paucity bowel gas in the upper abdomen. IMPRESSION: 1.  Stable lines and tubes. 2. Continued stable ventilation with bilateral pleural effusions and lower lobe collapse or consolidation. Electronically Signed   By: Odessa Fleming  M.D.   On: 06/11/2018 07:42   Dg Chest Port 1 View  Result Date: 06/10/2018 CLINICAL DATA:  Respiratory failure. EXAM: PORTABLE CHEST 1 VIEW COMPARISON:  Radiograph of June 09, 2018. FINDINGS: The heart size and mediastinal contours are within normal limits. No pneumothorax is noted. Endotracheal and nasogastric tubes are unchanged in position. Right internal jugular catheter is unchanged. Stable bibasilar edema or atelectasis is noted with associated pleural effusions. Bony thorax is unremarkable. The visualized skeletal structures are unremarkable. IMPRESSION: Stable support apparatus. Stable bibasilar opacities as described above. Electronically Signed   By: Lupita Raider, M.D.   On: 06/10/2018 10:36   Dg Chest Port 1 View  Result Date: 06/09/2018 CLINICAL DATA:  Evaluate endotracheal tube, respiratory failure EXAM: PORTABLE CHEST 1 VIEW COMPARISON:  Portable chest x-ray of 06/08/2018 and 06/07/2018 FINDINGS: The tip of the endotracheal tube appears to be 4.1 cm above the carina. Bibasilar opacities remain most consistent with atelectasis and effusions. Pneumonia cannot be excluded, nor and pulmonary vascular congestion. The heart is only mildly enlarged. Right central venous line tip overlies the upper SVC and no pneumothorax is seen. NG tube extends into the stomach. IMPRESSION: 1. Increasing opacification at the lung bases consistent with atelectasis, bilateral effusions, and possibly pneumonia. Pulmonary vascular congestion cannot be excluded. 2. Right central venous line tip overlies the upper SVC. 3. Endotracheal tube tip is approximately 4.1 cm above the carina. Electronically Signed   By: Dwyane Dee M.D.   On: 06/09/2018 10:36   Dg Chest Port 1 View  Result Date: 06/08/2018 CLINICAL DATA:  Status post central line placement today. EXAM: PORTABLE CHEST 1 VIEW COMPARISON:  Single-view of the chest earlier today. FINDINGS: New double lumen central venous catheter from right IJ approach  is in place with its tip in the mid to lower superior vena cava. Endotracheal tube and NG tube are unchanged. Defibrillator pad remains in place. Bilateral airspace disease and effusions are unchanged. No pneumothorax. IMPRESSION: Tip of new right IJ catheter projects in the mid to lower superior vena cava. No pneumothorax. No other change. Electronically Signed   By: Drusilla Kanner M.D.   On: 06/08/2018 11:12   Dg Chest Port 1 View  Result Date: 06/08/2018 CLINICAL DATA:  Intubation. EXAM: PORTABLE CHEST 1 VIEW COMPARISON:  06/07/2018. FINDINGS: Endotracheal tube and NG tube in stable position. Heart size normal. Diffuse bilateral pulmonary infiltrates/edema and bilateral pleural effusions. IMPRESSION: 1.  Lines and tubes stable position. 2. Diffuse bilateral pulmonary infiltrates/edema and pleural effusions. Findings have progressed from prior exam. Electronically Signed   By: Maisie Fus  Register   On: 06/08/2018 10:55   Dg Chest Port 1 View  Result Date: 06/07/2018 CLINICAL DATA:  Hypoxia EXAM: PORTABLE CHEST 1 VIEW COMPARISON:  2018-06-27 FINDINGS: Endotracheal tube tip is 2.6 cm above the carina. Nasogastric tube tip and side port are below the diaphragm. No pneumothorax. There is a moderate left pleural effusion with consolidation in the left base. There is mild atelectatic change in the right base. Heart size and pulmonary vascularity are within normal limits. No adenopathy. No  bone lesions. IMPRESSION: Tube positions as described without pneumothorax. Moderate left pleural effusion with left base consolidation. Right base atelectasis. Stable cardiac silhouette. Electronically Signed   By: Bretta Bang III M.D.   On: 06/07/2018 10:00   Dg Chest Port 1 View  Result Date: 06-14-18 CLINICAL DATA:  Intubation after CPR EXAM: PORTABLE CHEST 1 VIEW COMPARISON:  Chest radiograph 10/20/2014 FINDINGS: Endotracheal tube tip is just below the clavicular heads approximately 2 cm above the  inferior margin of the carina. The enteric tube tip and side port are below the field of view. The lungs are clear. Normal cardiomediastinum. IMPRESSION: 1. Endotracheal tube tip 2 cm above the inferior carina. 2. Clear lungs. Electronically Signed   By: Deatra Robinson M.D.   On: Jun 14, 2018 05:38   Dg Abd Portable 1v  Result Date: 06/06/2018 CLINICAL DATA:  72 year old male status post gastric tube placement EXAM: PORTABLE ABDOMEN - 1 VIEW COMPARISON:  Chest x-ray, yesterday June 14, 2018 FINDINGS: A gastric tube is present. The tip lies over the gastric body in good position. External defibrillator pads project over the left chest. A right femoral approach central venous catheter is present. The tip projects over the right sacral ala, presumably within the right common iliac vein. Mild linear atelectasis in the lung bases. No evidence of free air or bowel obstruction. Lower lumbar degenerative disc disease at L4-L5 and L5-S1. IMPRESSION: Well-positioned gastric tube with the tip overlying the gastric body. Right femoral approach central venous catheter. The tip overlies the expected location of the right common iliac vein. Electronically Signed   By: Malachy Moan M.D.   On: 06/06/2018 13:13   Vas [place] Lower Extremity Venous (dvt)  Result Date: 06/10/2018  Lower Venous Study Indications: Edema.  Limitations: Body habitus, bandages, line and critical condition, edema, coma. Comparison Study: Previous Lower extremties venous duplex exam on Jun 14, 2018 Performing Technologist: Hongying Cole  Examination Guidelines: A complete evaluation includes B-mode imaging, spectral Doppler, color Doppler, and power Doppler as needed of all accessible portions of each vessel. Bilateral testing is considered an integral part of a complete examination. Limited examinations for reoccurring indications may be performed as noted.  Right Venous Findings:  +---------+---------------+---------+-----------+----------+-------------------+          CompressibilityPhasicitySpontaneityPropertiesSummary             +---------+---------------+---------+-----------+----------+-------------------+ CFV                              Yes                  unable to access                                                          due to multiple                                                           tubes and lines and  patches on site     +---------+---------------+---------+-----------+----------+-------------------+ SFJ                              Yes                  poor visibility     +---------+---------------+---------+-----------+----------+-------------------+ FV Prox                 Yes      Yes                                      +---------+---------------+---------+-----------+----------+-------------------+ FV Mid                  Yes      Yes                  poor visibility     +---------+---------------+---------+-----------+----------+-------------------+ FV Distal               Yes      Yes                  poor visibility     +---------+---------------+---------+-----------+----------+-------------------+ PFV                                                   poor visibility     +---------+---------------+---------+-----------+----------+-------------------+ POP      Full           Yes      Yes                                      +---------+---------------+---------+-----------+----------+-------------------+ PTV                                                   poor visibility     +---------+---------------+---------+-----------+----------+-------------------+ PERO                                                  poor visibility     +---------+---------------+---------+-----------+----------+-------------------+  complex fluid collection seen at popliteal fossa with measurement 2.78x2.36x3.43cm medially.  Left Venous Findings: +---------+---------------+---------+-----------+----------+-------------------+          CompressibilityPhasicitySpontaneityPropertiesSummary             +---------+---------------+---------+-----------+----------+-------------------+ CFV                     Yes      Yes                  unable to access  due to multiple                                                           tubes and lines and                                                       patches on site     +---------+---------------+---------+-----------+----------+-------------------+ SFJ                                                   unable to access                                                          due to multiple                                                           tubes and lines and                                                       patches on site     +---------+---------------+---------+-----------+----------+-------------------+ FV Prox                 Yes      Yes                  not adequate                                                              visibility          +---------+---------------+---------+-----------+----------+-------------------+ FV Mid   Full           Yes      Yes                                      +---------+---------------+---------+-----------+----------+-------------------+ FV Distal               No       Yes                  poor visibility     +---------+---------------+---------+-----------+----------+-------------------+ PFV  not visualized      +---------+---------------+---------+-----------+----------+-------------------+ POP      None                                          Acute               +---------+---------------+---------+-----------+----------+-------------------+ PTV                                                   poor visibility     +---------+---------------+---------+-----------+----------+-------------------+ PERO                                                  poor visibility     +---------+---------------+---------+-----------+----------+-------------------+    Summary: Right: Limited study due to very poor visibility and access due to patient condition, cannot completely exclude the possible thrombosis existence. Left: Findings consistent with acute deep vein thrombosis involving the left popliteal vein.  *See table(s) above for measurements and observations. Electronically signed by Sherald Hess MD on 06/10/2018 at 6:02:54 PM.    Final    Vas [place] Lower Extremity Venous (dvt)  Result Date: 06-20-18  Lower Venous Study Risk Factors: Suspected PE DVT History of DVT Right ventricular enlargement and dysfunction. Limitations: Line, bandages and positioning, ventilation. Comparison Study: Prior positive lower extremity venous duplex from 10/17/14, is                   available for comparison. Performing Technologist: Sherren Kerns RVS  Examination Guidelines: A complete evaluation includes B-mode imaging, spectral Doppler, color Doppler, and power Doppler as needed of all accessible portions of each vessel. Bilateral testing is considered an integral part of a complete examination. Limited examinations for reoccurring indications may be performed as noted.  Right Venous Findings: +---------+---------------+---------+-----------+----------+-------------------+          CompressibilityPhasicitySpontaneityPropertiesSummary             +---------+---------------+---------+-----------+----------+-------------------+ CFV      Full           Yes      Yes                                       +---------+---------------+---------+-----------+----------+-------------------+ SFJ      Full                                                             +---------+---------------+---------+-----------+----------+-------------------+ FV Prox  Full                                                             +---------+---------------+---------+-----------+----------+-------------------+ FV Mid   Full                                                             +---------+---------------+---------+-----------+----------+-------------------+  FV DistalFull                                                             +---------+---------------+---------+-----------+----------+-------------------+ PFV      Full                                                             +---------+---------------+---------+-----------+----------+-------------------+ POP                                                   unable to                                                                 adequately                                                                visualize           +---------+---------------+---------+-----------+----------+-------------------+ PTV      Full                                                             +---------+---------------+---------+-----------+----------+-------------------+ PERO     Full                                                             +---------+---------------+---------+-----------+----------+-------------------+ GSV      Full                                                             +---------+---------------+---------+-----------+----------+-------------------+  Left Venous Findings: +---------+---------------+---------+-----------+----------+-------------------+          CompressibilityPhasicitySpontaneityPropertiesSummary              +---------+---------------+---------+-----------+----------+-------------------+ CFV  line and bandages                                                         in groin            +---------+---------------+---------+-----------+----------+-------------------+ FV Prox  Full           Yes      Yes                                      +---------+---------------+---------+-----------+----------+-------------------+ FV Mid   Full                                                             +---------+---------------+---------+-----------+----------+-------------------+ FV DistalFull                                                             +---------+---------------+---------+-----------+----------+-------------------+ POP      Partial                                      Acute               +---------+---------------+---------+-----------+----------+-------------------+  Left Technical Findings: Not visualized segments include common femoral, SFJ, profunda, posterior tibial, and peroneal veins.   *See table(s) above for measurements and observations. Electronically signed by Coral Else MD on 05/22/2018 at 8:06:48 PM.    Final     Labs:  CBC: Recent Labs    06/23/18 0517 06/24/18 0243 06/25/18 0400 06/26/18 0446  WBC 19.2* 21.2* 24.0* 24.9*  HGB 6.8* 8.0* 7.9* 8.0*  HCT 21.7* 25.3* 24.3* 25.8*  PLT 484* 539* 575* 679*    COAGS: Recent Labs    06/14/2018 1800 06/07/18 0408 06/09/18 0308 06/18/18 0715 06/19/18 1003 06/26/18 0446 06/27/18 0533  INR  --  1.95 1.17 1.09 1.13  --   --   APTT 72*  --   --   --  75* 123* 76*    BMP: Recent Labs    06/25/18 1633 06/26/18 0446 06/26/18 1551 06/27/18 0533  NA 135 137 136 136  K 5.1 5.0 5.1 4.8  CL 102 101 102 101  CO2 21* 22 22 23   GLUCOSE 174* 116* 146* 112*  BUN 77* 58* 46* 37*  CALCIUM 8.4* 8.6* 8.3* 8.4*  CREATININE 5.25* 3.79* 2.90* 2.47*    GFRNONAA 10* 15* 20* 25*  GFRAA 11* 17* 24* 29*    LIVER FUNCTION TESTS: Recent Labs    06/06/18 0807 06/07/18 0812 06/08/18 0441  06/15/18 0347  06/25/18 1633 06/26/18 0446 06/26/18 1551 06/27/18 0533  BILITOT 1.0 2.0* 2.3*  --  5.5*  --   --   --   --   --   AST 485* 271* 279*  --  335*  --   --   --   --   --  ALT 266* 166* 145*  --  163*  --   --   --   --   --   ALKPHOS 62 45 48  --  118  --   --   --   --   --   PROT <3.0* <3.0* 3.3*  --  6.1*  --   --   --   --   --   ALBUMIN 1.5* 1.4* 1.4*   < > 1.8*   < > 1.5* 1.7* 1.6* 1.8*   < > = values in this interval not displayed.    TUMOR MARKERS: No results for input(s): AFPTM, CEA, CA199, CHROMGRNA in the last 8760 hours.  Assessment and Plan:  PEA; presumed massive PE AKI/CRF Temp cath in place 11/4 Need for continued dialysis Will scheduled tunneled cath placement when more appropriate Need wbc trending down and Afeb Will watch and plan asap  Risks and benefits discussed with the patient's daughter at bedside including, but not limited to bleeding, infection, vascular injury, pneumothorax which may require chest tube placement, air embolism or even death  All of the patient's dsaughter questions were answered, she is agreeable to proceed. Consent signed and in chart.   Thank you for this interesting consult.  I greatly enjoyed meeting Bryan Parker and look forward to participating in their care.  A copy of this report was sent to the requesting provider on this date.  Electronically Signed: Robet Leu, PA-C 06/27/2018, 11:48 AM   I spent a total of 40 Minutes    in face to face in clinical consultation, greater than 50% of which was counseling/coordinating care for tunneled HD catheter

## 2018-06-27 NOTE — Progress Notes (Signed)
S:Pt on vent via trach, unresponsive family at bedside O:BP (!) 130/98   Pulse (!) 129   Temp 99.2 F (37.3 C) (Oral)   Resp (!) 35   Ht 5' 10" (1.778 m)   Wt 61.6 kg   SpO2 100%   BMI 19.49 kg/m   Intake/Output Summary (Last 24 hours) at 06/27/2018 0859 Last data filed at 06/27/2018 0800 Gross per 24 hour  Intake 2168.19 ml  Output 3312 ml  Net -1143.81 ml   Intake/Output: I/O last 3 completed shifts: In: 3207.7 [P.O.:20; I.V.:817.6; NG/GT:2220; IV Piggyback:150.1] Out: 5014 [Other:5014]  Intake/Output this shift:  Total I/O In: 84 [I.V.:24; NG/GT:60] Out: 108 [Other:108] Weight change: -3.5 kg Gen: NAD CVS: tachy Resp: occ rhonchi Abd: +BS, soft, NT Ext: ischemic changes to toes  Recent Labs  Lab 06/24/18 0243 06/24/18 1029 06/25/18 0401 06/25/18 1633 06/26/18 0446 06/26/18 1551 06/27/18 0533  NA 135 135 134* 135 137 136 136  K 4.9 5.2* 5.6* 5.1 5.0 5.1 4.8  CL 100 101 102 102 101 102 101  CO2 19* 20* 18* 21* _0 GLUCOSE 129* 113* 144* 174* 116* 146* 112*  BUN 66* 75* 92* 77* 58* 46* 37*  CREATININE 4.84* 5.57* 6.46* 5.25* 3.79* 2.90* 2.47*  ALBUMIN 1.6* 1.5* 1.5* 1.5* 1.7* 1.6* 1.8*  CALCIUM 8.4* 8.4* 8.5* 8.4* 8.6* 8.3* 8.4*  PHOS 3.3 3.5 3.8 3.6 3.2 3.2 2.5   Liver Function Tests: Recent Labs  Lab 06/26/18 0446 06/26/18 1551 06/27/18 0533  ALBUMIN 1.7* 1.6* 1.8*   No results for input(s): LIPASE, AMYLASE in the last 168 hours. No results for input(s): AMMONIA in the last 168 hours. CBC: Recent Labs  Lab 06/22/18 0410 06/23/18 0517 06/24/18 0243 06/25/18 0400 06/26/18 0446  WBC 18.0* 19.2* 21.2* 24.0* 24.9*  HGB 7.6* 6.8* 8.0* 7.9* 8.0*  HCT 23.8* 21.7* 25.3* 24.3* 25.8*  MCV 89.8 93.1 91.3 94.2 94.5  PLT 521* 484* 539* 575* 679*   Cardiac Enzymes: No results for input(s): CKTOTAL, CKMB, CKMBINDEX, TROPONINI in the last 168 hours. CBG: Recent Labs  Lab 06/26/18 1952 06/26/18 2022 06/26/18 2347 06/27/18 0311 06/27/18 0757   GLUCAP 81 105* 115* 107* 146*    Iron Studies: No results for input(s): IRON, TIBC, TRANSFERRIN, FERRITIN in the last 72 hours. Studies/Results: No results found. . sodium chloride   Intravenous Once  . chlorhexidine gluconate (MEDLINE KIT)  15 mL Mouth Rinse BID  . Chlorhexidine Gluconate Cloth  6 each Topical Q0600  . clonazepam  1 mg Per Tube BID  . darbepoetin (ARANESP) injection - NON-DIALYSIS  200 mcg Subcutaneous Q Sun-1800  . insulin aspart  0-9 Units Subcutaneous Q4H  . insulin aspart  5 Units Subcutaneous Q6H  . mouth rinse  15 mL Mouth Rinse 10 times per day  . pantoprazole sodium  40 mg Per Tube Q1200  . QUEtiapine  200 mg Oral BID  . sodium chloride flush  10-40 mL Intracatheter Q12H    BMET    Component Value Date/Time   NA 136 06/27/2018 0533   K 4.8 06/27/2018 0533   CL 101 06/27/2018 0533   CO2 23 06/27/2018 0533   GLUCOSE 112 (H) 06/27/2018 0533   BUN 37 (H) 06/27/2018 0533   CREATININE 2.47 (H) 06/27/2018 0533   CALCIUM 8.4 (L) 06/27/2018 0533   GFRNONAA 25 (L) 06/27/2018 0533   GFRAA 29 (L) 06/27/2018 0533   CBC    Component Value Date/Time   WBC 24.9 (H) 06/26/2018  0446   RBC 2.73 (L) 06/26/2018 0446   HGB 8.0 (L) 06/26/2018 0446   HCT 25.8 (L) 06/26/2018 0446   PLT 679 (H) 06/26/2018 0446   MCV 94.5 06/26/2018 0446   MCH 29.3 06/26/2018 0446   MCHC 31.0 06/26/2018 0446   RDW 22.1 (H) 06/26/2018 0446   LYMPHSABS 1.1 06/08/2018 0441   MONOABS 1.1 (H) 06/08/2018 0441   EOSABS 7.4 (H) 06/08/2018 0441   BASOSABS 0.0 06/08/2018 0441     Assessment/Plan:  1. AKI/CKD stage 4- in setting of PEA arrest (following massive PE) while on ACE inhibition, shock, severe cor pulmonale/CHF, and pressors.  He has remained oliguric and dialysis dependent since 06/08/18.  Currently on CVVHD but off of pressors.  Hopefully can transition to IHD.  Will check bladder scan to see if he is starting to make any urine.  2. VDRF s/p trach- per PCCM 3. PEA cardiac  arrest due to massive PE- with AMS and ARF. 4. AMS-  Multifactorial. Per PCCM 5. PE s/p tpa in ED.  Cont with IV heparin 6. Sepsis- on 06/14/18 cultures negative and off of pressors 7. ABLA transfuse prn 8. Vascular access- LIJ tdc placed 06/20/18 and will need TDC this week if ok with PCCM.  Donetta Potts, MD [employer] [phone]

## 2018-06-28 DIAGNOSIS — R0609 Other forms of dyspnea: Secondary | ICD-10-CM

## 2018-06-28 DIAGNOSIS — Z66 Do not resuscitate: Secondary | ICD-10-CM

## 2018-06-28 DIAGNOSIS — Z515 Encounter for palliative care: Secondary | ICD-10-CM

## 2018-06-28 DIAGNOSIS — R0600 Dyspnea, unspecified: Secondary | ICD-10-CM

## 2018-06-28 LAB — RENAL FUNCTION PANEL
ANION GAP: 10 (ref 5–15)
Albumin: 2 g/dL — ABNORMAL LOW (ref 3.5–5.0)
BUN: 32 mg/dL — ABNORMAL HIGH (ref 8–23)
CHLORIDE: 98 mmol/L (ref 98–111)
CO2: 27 mmol/L (ref 22–32)
CREATININE: 2.28 mg/dL — AB (ref 0.61–1.24)
Calcium: 8.7 mg/dL — ABNORMAL LOW (ref 8.9–10.3)
GFR calc non Af Amer: 27 mL/min — ABNORMAL LOW (ref >60)
GFR, EST AFRICAN AMERICAN: 32 mL/min — AB (ref >60)
Glucose, Bld: 120 mg/dL — ABNORMAL HIGH (ref 70–99)
Phosphorus: 3.2 mg/dL (ref 2.5–4.6)
Potassium: 4.5 mmol/L (ref 3.5–5.1)
Sodium: 135 mmol/L (ref 135–145)

## 2018-06-28 LAB — CBC
HEMATOCRIT: 23.5 % — AB (ref 39.0–52.0)
HEMOGLOBIN: 7 g/dL — AB (ref 13.0–17.0)
MCH: 29.5 pg (ref 26.0–34.0)
MCHC: 29.8 g/dL — ABNORMAL LOW (ref 30.0–36.0)
MCV: 99.2 fL (ref 80.0–100.0)
NRBC: 6.7 % — AB (ref 0.0–0.2)
PLATELETS: 742 10*3/uL — AB (ref 150–400)
RBC: 2.37 MIL/uL — ABNORMAL LOW (ref 4.22–5.81)
RDW: 21.8 % — AB (ref 11.5–15.5)
WBC: 22.8 10*3/uL — AB (ref 4.0–10.5)

## 2018-06-28 LAB — APTT: aPTT: 104 seconds — ABNORMAL HIGH (ref 24–36)

## 2018-06-28 LAB — GLUCOSE, CAPILLARY
GLUCOSE-CAPILLARY: 127 mg/dL — AB (ref 70–99)
Glucose-Capillary: 118 mg/dL — ABNORMAL HIGH (ref 70–99)
Glucose-Capillary: 112 mg/dL — ABNORMAL HIGH (ref 70–99)

## 2018-06-28 LAB — CALCIUM, IONIZED: CALCIUM, IONIZED, SERUM: 4.5 mg/dL (ref 4.5–5.6)

## 2018-06-28 LAB — HEPARIN LEVEL (UNFRACTIONATED): Heparin Unfractionated: 0.6 IU/mL (ref 0.30–0.70)

## 2018-06-28 LAB — MAGNESIUM: Magnesium: 2.7 mg/dL — ABNORMAL HIGH (ref 1.7–2.4)

## 2018-06-28 MED ORDER — CLONAZEPAM 0.5 MG PO TBDP
2.0000 mg | ORAL_TABLET | Freq: Three times a day (TID) | ORAL | Status: DC
  Administered 2018-06-28: 2 mg
  Filled 2018-06-28: qty 4

## 2018-06-28 MED ORDER — MIDAZOLAM HCL 2 MG/2ML IJ SOLN
1.0000 mg | INTRAMUSCULAR | Status: DC | PRN
  Administered 2018-06-28 – 2018-06-29 (×4): 2 mg via INTRAVENOUS
  Filled 2018-06-28 (×4): qty 2

## 2018-06-28 MED ORDER — MORPHINE SULFATE (PF) 2 MG/ML IV SOLN
2.0000 mg | INTRAVENOUS | Status: DC | PRN
  Administered 2018-06-28 (×3): 2 mg via INTRAVENOUS
  Filled 2018-06-28 (×3): qty 1

## 2018-06-28 MED ORDER — GLYCOPYRROLATE 0.2 MG/ML IJ SOLN
0.4000 mg | Freq: Four times a day (QID) | INTRAMUSCULAR | Status: DC | PRN
  Administered 2018-06-28 – 2018-06-29 (×2): 0.4 mg via INTRAVENOUS
  Filled 2018-06-28 (×2): qty 2

## 2018-06-28 MED ORDER — MORPHINE BOLUS VIA INFUSION
1.0000 mg | INTRAVENOUS | Status: DC | PRN
  Filled 2018-06-28: qty 1

## 2018-06-28 MED ORDER — MORPHINE 100MG IN NS 100ML (1MG/ML) PREMIX INFUSION
5.0000 mg/h | INTRAVENOUS | Status: DC
  Administered 2018-06-28: 10 mg/h via INTRAVENOUS
  Administered 2018-06-28: 5 mg/h via INTRAVENOUS
  Filled 2018-06-28 (×2): qty 100

## 2018-06-28 NOTE — Progress Notes (Signed)
   06/28/18 0900  Clinical Encounter Type  Visited With Patient and family together  Visit Type Critical Care  Referral From Nurse  Spiritual Encounters  Spiritual Needs Prayer;Emotional  Responded to Page for grief support for wife. Spoke with nurse and she said wife had strong faith and is in agreement with Extubation. Met with wife and wife express that she wanted husband to confirm his belief in [place] and salvation. Presented the [facility] plan and patient nodded in agreement and understanding. Patient was alert and aware of what was happening. He was very responsive to his wife and my voice and questions. Wife said that daughter was struggling and she hope that their meeting with Doctor before extubation will help her with the decision. Provided spiritual care and prayer. Will follow-up as needed.  Chaplain Matthew Folks [phone]

## 2018-06-28 NOTE — Progress Notes (Addendum)
ANTICOAGULATION CONSULT NOTE   Pharmacy Consult for Heparin Indication: suspected pulmonary embolus and confirmed DVT  No Known Allergies  Patient Measurements: Height: 5\' 10"  (177.8 cm) Weight: 131 lb 13.4 oz (59.8 kg) IBW/kg (Calculated) : 73  Vital Signs: Temp: 99.3 F (37.4 C) (11/12 0700) Temp Source: Oral (11/12 0700) BP: 145/106 (11/12 0700) Pulse Rate: 113 (11/12 0700)  Labs: Recent Labs    06/26/18 0446 06/26/18 1551 06/27/18 0533 06/27/18 1645 06/28/18 0220  HGB 8.0*  --   --   --  7.0*  HCT 25.8*  --   --   --  23.5*  PLT 679*  --   --   --  742*  APTT 123*  --  76*  --  104*  HEPARINUNFRC 0.88* 0.65 0.46  --  0.60  CREATININE 3.79* 2.90* 2.47* 2.55* 2.28*    Estimated Creatinine Clearance: 25.1 mL/min (A) (by C-G formula based on SCr of 2.28 mg/dL (H)).  Assessment: 72yo male with DVT and likely PE s/p PEA arrest and TNKase 10/20 for heparin. Currently on CRRT. Trach site bleeding now resolved and heparin was restarted.  Heparin level supratherapeutic at 0.60 on heparin 1400 units/hr. No signs/symptoms of bleeding or issues with infusion reported by nursing. Hgb down to 7.  Goal of Therapy:  Heparin level 0.3-0.5 units/ml Monitor platelets by anticoagulation protocol: Yes   Plan:  Decrease heparin infusion to 1300 units/hr. Family pursuing comfort measures so will not check 8 hour level. Check heparin level and CBC with morning labs if heparin still running. Monitor for s/sx of bleeding   Arvilla MarketMelissa Lore, PharmD PGY1 Pharmacy Resident Phone [phone] 06/28/2018     7:47 AM

## 2018-06-28 NOTE — Progress Notes (Signed)
Spoke with nephrologitst, Dr. Arrie Aran regarding pt plan of care and families wishes to not proceed with HD and placement of tunnel catheter. Dr. Arrie Aran ordered to continue CRRT until family meets and decides  to proceed with extuabation.

## 2018-06-28 NOTE — Progress Notes (Signed)
Pt taken off vent and placed on ATC 21% air with humidity for comfort per withdrawal order.  RN and family at bedside.

## 2018-06-28 NOTE — Progress Notes (Signed)
NAME:  Bryan Parker, MRN:  [id number], DOB:  1946-02-04, LOS: 23 ADMISSION DATE:  11-06-2017, CONSULTATION DATE:  11-06-2017 REFERRING MD:  Dr. Rhunette CroftNanavati , CHIEF COMPLAINT:  Cardiac Arrest    Brief History    72 year old man admitted 10/20 post cardiac arrest with PEA received TNKase in the EDfor presumed PEdue to bedside echo showing dilated RV. He has a history of prior PE and anticoagulation was discontinued in 2018. Duplex showed left popliteal DVT He wasin severe cor pulmonale on high-dose epinephrine, Levophed, vasopressin and dobutamine.  Course complicated by multiorgan failure requiringCRRT and prolonged ventilationrequiring tracheostomy on 11/2.  Past Medical History  H/O DVT/PE, CKD  Significant Hospital Events   10/20 > Presents to ED  1021 with a partial code no CPR 10/22 bleeding from IV sites and foley, heparin d/c'd for 2 hours then resumed. Hgb 6.8 > 1u PRBC transfused 10/23 CRRT started.  Giapreza added. 10/24 coude catheter placed by urology and CBI started. 10/25 - Pressor requirements much improved after giaprezza added 10/23; however, remains on vaso0.03, NE 23, epi 10, dobut 20, giapreza 25. CK up from 14.5 on 10/24 up to 35K on 10/25.  CRRT running.  10/26 - CK 31,000 (was 34,000 yesterday) . Volume removal continues. Fluid challenge strategy cancelled. CK better. Legs thinner with volume removal. Pressors: off giaprezza, levophed 6, dobutamine 10, epi 16 and vaso 0.03. Had vomit yesterday and TF on hold. Has bloody urine - seen by urology  10/27  - pressor needs coming down - off epi gtt. Dobutamine 10, Vaso 0.03 and levophed 4-816mcg/min. . Still largely volume . Volume removal via CRRT. CK down to 24K. TF still on hold.  10/28 brady , hypotensive with precedex 11/2>> Trach 11/3 bleeding around trach site - packed , heparin held   Consults: date of consult/date signed off & final recs:  PCCM 10/20 Heart failure 10/21 >  Urology 10/24 >   renal  Procedures (surgical and bedside):  ETT 10/20 >>11/2 Trach (JY) 11/2>>> Right Femoral CVC 10/20 >> 10/29 Left Femoral Aline 10/20 >> 10/30 R IJ HD cath 10/23 > 11/4 Rt radial a line 10/30 >> out L IJ HD cath 11/4 >>  Significant Diagnostic Tests:  CT Head 10/20 >> neg Echo 10/20 > EF 55 - 60%, severely dilated RV, mod dilated RA, mod TR. CT chest/abd/ pelvis 10/30  >> no bleed CT head 10/30 neg  Micro Data:  Blood 10/20 >> neg Sputum 10/20 >> neg U/A 10/20 >> neg Blood 10/29 > ng resp 10/29  >>ng Sputum 10/29>> GS few gram +>>  Few candida albicans Blood 10/29>> ng Blood 11/8 >> ng  c diff 11/8 neg resp 11/3, 11/8  >> ng  Antimicrobials:  zosyn 10/20 >> 11/1 , 11/3 >> vanc 10/20 >>off, 10/29 >>11/6, 11/8 >> 11/10  SUBJECTIVE/OVERNIGHT/INTERVAL HX  Alert to voice.  Intermittently following commands.  Was tachycardic overnight.  Low-grade temp.  Objective   Blood pressure (!) 159/137, pulse (!) 122, temperature 99.3 F (37.4 C), temperature source Oral, resp. rate (!) 34, height 5\' 10"  (1.778 m), weight 59.8 kg, SpO2 100 %.    Vent Mode: PRVC FiO2 (%):  [30 %] 30 % Set Rate:  [14 bmp] 14 bmp Vt Set:  [500 mL] 500 mL PEEP:  [5 cmH20] 5 cmH20 Plateau Pressure:  [17 cmH20-28 cmH20] 21 cmH20   Intake/Output Summary (Last 24 hours) at 06/28/2018 0848 Last data filed at 06/28/2018 0800 Gross per 24 hour  Intake 2144.74  ml  Output 3050 ml  Net -905.26 ml   Filed Weights   06/26/18 0500 06/27/18 0500 06/28/18 0412  Weight: 65.1 kg 61.6 kg 59.8 kg    General appearance: 72 year old male, trach in place, alert to voice Eyes: Sclera anicteric, pupils reactive will track HENT: NCAT-NG tube in place Neck: Trach ostomy tube in place, left IJ, trachea midline, no evidence of bleeding Lungs: Bilateral ventilated breath sounds CV: Tachycardia cardiac, regular S1-S2 Abdomen: Soft, nontender, nondistended bowel sounds present Extremities: Contracture bruits  present  skin: Wounds wrapped on lower extremities, sacral stage II per nursing Psych: Possible ongoing delirium Neuro: Moves all 4 extremities spontaneously but sometimes without purpose.      Assessment & Plan:   Acute on chronic hypoxemic hypercarbic respiratory failure status post intubation mechanical ventilation and transition to tracheostomy now with need for prolonged weaning.  Will likely need long-term care with mental support and now dialysis. PLAN -Long discussion with patient's family at bedside today.  Wife has made decision to pursue comfort measures.  She would not want to see him on prolonged mechanical ventilation.  Or staying for a prolonged period of time in the hospital requiring ongoing dialysis need.  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 -This is likely secondary to above.  PEA Cardiac Arrest - concern due to massive PE Status post TPA in the emergency room for presumed massive PE -On heparin drip  Fever in the ICU Unclear etiology at this time.  Possibly medication induced.  No clear source Does have sacral and lower extremity stage II wounds Procalcitonin remains elevated but is reducing. Plan: -Fever somewhat better.  Continue to Azo  Acute renal failure on chronic renal failure Rhabdomyolysis on admission Hyponatremia Now with continued need for CRRT, concern for prolonged need of IHD -We will need to hold ongoing plans for tunneled catheter placement at this time. -At this point the family does not want to continue dialysis need.  Acute blood loss anemia Did have bleeding around tracheostomy site.  This has improved PLAN Conservative transfusion for hemoglobin less than 7  Urethral bleeding - likely traumatic following tPA administration. -Observe  Hyperglycemia. -SSI  Protein calorie malnutrition PLAN Continue tube feeds at goal  Resolved problems : Septic  shock Thrombocytopenia. Trach bleed  Disposition / Summary of Today's Plan 06/28/18    Code Status: 06/07/2018 partial code no CPR Family Communication: wife updated at bedside.   Palliative care discussion with wife at bedside this morning.  Greater than 50% of this patient's visit was spent face-to-face discussing palliative care needs and goals of care with the patient's wife.  Additionally there is a planned palliative care meeting this morning  [name], DO [facility] 06/28/2018 8:48 AM  Personal pager: [phone] If unanswered, please page CCM On-call: [phone]

## 2018-06-28 NOTE — Progress Notes (Signed)
Dr. Tonia BroomsIcard and wife at beside discussing plan of care. Wife vocalized that the "patient would not want all this." She express that "we are just prolonging the inevitable."Wife requested to meet with family, palliative care, and Dr. Tonia BroomsIcard to discuss a plan to make patient comfortable. Dr. Arrie Aranoladonato to be page in regards to CRRT. Wife stated that she does not want to further pursue a tunnel cath or Hemodialysis.

## 2018-06-28 NOTE — Progress Notes (Addendum)
S:Became very tachypnic and agitated when on weaning trials O:BP (!) 150/136   Pulse (!) 125   Temp 99.3 F (37.4 C) (Oral)   Resp (!) 31   Ht _0  (1.778 m)   Wt 59.8 kg   SpO2 100%   BMI 18.92 kg/m   Intake/Output Summary (Last 24 hours) at 06/28/2018 1128 Last data filed at 06/28/2018 1100 Gross per 24 hour  Intake 2111.82 ml  Output 3026 ml  Net -914.18 ml   Intake/Output: I/O last 3 completed shifts: In: 3221.4 [I.V.:781.2; NG/GT:2240; IV Piggyback:200.2] Out: 4612 [Other:4612]  Intake/Output this shift:  Total I/O In: 333 [I.V.:93; NG/GT:240] Out: 524 [Other:524] Weight change: -1.8 kg Gen: frail, ill-appearing AAM on vent via trach CVS: tachy Resp: scattered rhonchi at bases Abd: +BS Ext: no edema  Recent Labs  Lab 06/25/18 0401 06/25/18 1633 06/26/18 0446 06/26/18 1551 06/27/18 0533 06/27/18 1645 06/28/18 0220  NA 134* 135 137 136 136 136 135  K 5.6* 5.1 5.0 5.1 4.8 4.3 4.5  CL 102 102 101 102 101 101 98  CO2 18* 21* _1 GLUCOSE 144* 174* 116* 146* 112* 108* 120*  BUN 92* 77* 58* 46* 37* 37* 32*  CREATININE 6.46* 5.25* 3.79* 2.90* 2.47* 2.55* 2.28*  ALBUMIN 1.5* 1.5* 1.7* 1.6* 1.8* 1.8* 2.0*  CALCIUM 8.5* 8.4* 8.6* 8.3* 8.4* 8.3* 8.7*  PHOS 3.8 3.6 3.2 3.2 2.5 2.7 3.2   Liver Function Tests: Recent Labs  Lab 06/27/18 0533 06/27/18 1645 06/28/18 0220  ALBUMIN 1.8* 1.8* 2.0*   No results for input(s): LIPASE, AMYLASE in the last 168 hours. No results for input(s): AMMONIA in the last 168 hours. CBC: Recent Labs  Lab 06/23/18 0517 06/24/18 0243 06/25/18 0400 06/26/18 0446 06/28/18 0220  WBC 19.2* 21.2* 24.0* 24.9* 22.8*  HGB 6.8* 8.0* 7.9* 8.0* 7.0*  HCT 21.7* 25.3* 24.3* 25.8* 23.5*  MCV 93.1 91.3 94.2 94.5 99.2  PLT 484* 539* 575* 679* 742*   Cardiac Enzymes: No results for input(s): CKTOTAL, CKMB, CKMBINDEX, TROPONINI in the last 168 hours. CBG: Recent Labs  Lab 06/27/18 1527 06/27/18 2038 06/27/18 2359  06/28/18 0311 06/28/18 0751  GLUCAP 132* 153* 127* 118* 112*    Iron Studies: No results for input(s): IRON, TIBC, TRANSFERRIN, FERRITIN in the last 72 hours. Studies/Results: No results found. . sodium chloride   Intravenous Once  . chlorhexidine gluconate (MEDLINE KIT)  15 mL Mouth Rinse BID  . Chlorhexidine Gluconate Cloth  6 each Topical Q0600  . clonazepam  2 mg Per Tube TID  . darbepoetin (ARANESP) injection - NON-DIALYSIS  200 mcg Subcutaneous Q Sun-1800  . insulin aspart  0-9 Units Subcutaneous Q4H  . insulin aspart  5 Units Subcutaneous Q6H  . mouth rinse  15 mL Mouth Rinse 10 times per day  . pantoprazole sodium  40 mg Per Tube Q1200  . QUEtiapine  200 mg Per Tube BID  . sodium chloride flush  10-40 mL Intracatheter Q12H    BMET    Component Value Date/Time   NA 135 06/28/2018 0220   K 4.5 06/28/2018 0220   CL 98 06/28/2018 0220   CO2 27 06/28/2018 0220   GLUCOSE 120 (H) 06/28/2018 0220   BUN 32 (H) 06/28/2018 0220   CREATININE 2.28 (H) 06/28/2018 0220   CALCIUM 8.7 (L) 06/28/2018 0220   GFRNONAA 27 (L) 06/28/2018 0220   GFRAA 32 (L) 06/28/2018 0220   CBC    Component Value Date/Time  WBC 22.8 (H) 06/28/2018 0220   RBC 2.37 (L) 06/28/2018 0220   HGB 7.0 (L) 06/28/2018 0220   HCT 23.5 (L) 06/28/2018 0220   PLT 742 (H) 06/28/2018 0220   MCV 99.2 06/28/2018 0220   MCH 29.5 06/28/2018 0220   MCHC 29.8 (L) 06/28/2018 0220   RDW 21.8 (H) 06/28/2018 0220   LYMPHSABS 1.1 06/08/2018 0441   MONOABS 1.1 (H) 06/08/2018 0441   EOSABS 7.4 (H) 06/08/2018 0441   BASOSABS 0.0 06/08/2018 0441    Assessment/Plan:  1. AKI/CKD stage 4- in setting of PEA arrest (following massive PE) while on ACE inhibition, shock, severe cor pulmonale/CHF, and pressors.  He has remained oliguric and dialysis dependent since 06/08/18.  Currently on CVVHD but off of pressors.  Family leaning towards stopping dialysis and transitioning to comfort measures.  2. VDRF s/p trach- per  PCCM 3. PEA cardiac arrest due to massive PE- with AMS and ARF. 4. AMS-  Multifactorial. Per PCCM 5. PE s/p tpa in ED.  Cont with IV heparin 6. Sepsis- on 06/14/18 cultures negative and off of pressors 7. ABLA transfuse prn 8. Vascular access- [facility] tdc placed 06/20/18 and discussed the need for [facility] this week if family wanted to pursue aggressive measures, however they are meeting with Palliative care and are leaning toward comfort measures. 9. Disposition- poor overall prognosis and agree with Palliative care meeting to set goals/limits of care. Per family discussion, decision was made to transition to comfort care.  CVVHD was stopped.  Will sign off.  Please call with any questions or concerns.   Donetta Potts, MD [employer] [phone]

## 2018-06-29 LAB — CULTURE, BLOOD (ROUTINE X 2)
CULTURE: NO GROWTH
Culture: NO GROWTH
SPECIAL REQUESTS: ADEQUATE
Special Requests: ADEQUATE

## 2018-06-29 LAB — CALCIUM, IONIZED: Calcium, Ionized, Serum: 4.4 mg/dL — ABNORMAL LOW (ref 4.5–5.6)

## 2018-07-06 ENCOUNTER — Telehealth: Payer: Self-pay

## 2018-07-06 NOTE — Telephone Encounter (Signed)
On 07/06/18 [facility]  DC will be taken to Pulmonary Unit.. Patient is a patient of Doctor Icard...  DC will be taken to Pulmonary Unit for signature.

## 2018-07-08 ENCOUNTER — Telehealth: Payer: Self-pay

## 2018-07-08 NOTE — Telephone Encounter (Signed)
On 07/08/18 I received the d/c back from Doctor [facility]. I got the d/c ready and called the funeral home to let them know the d/c is ready for pickup.

## 2018-07-17 NOTE — Death Summary Note (Signed)
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 year old male who was admitted 2023/06/29 post cardiac arrest with PEA received TNKase in the EDfor presumed PEdue to bedside echo showing dilated RV. He has a history of prior PE and anticoagulation was discontinued in 2017/01/14. Duplex showed left popliteal DVT. He wasin severe cor pulmonale on high-dose epinephrine, Levophed, vasopressin and dobutamine. Course complicated by multiorgan failure requiringCRRT and prolonged ventilationrequiring tracheostomy on 11/2.  The patient had a prolonged ICU stay. Family decided to pursue comfort measures. The patient died peacefully with family at bedside.   Pertinent Labs and Studies  Significant Diagnostic Studies Ct Abdomen Pelvis Wo Contrast  Result Date: 06/15/2018 CLINICAL DATA:  Pneumonia, hematuria. EXAM: CT CHEST, ABDOMEN AND PELVIS WITHOUT CONTRAST TECHNIQUE:  Multidetector CT imaging of the chest, abdomen and pelvis was performed following the standard protocol without IV contrast. COMPARISON:  CT scan of October 16, 2014. FINDINGS: CT CHEST FINDINGS Cardiovascular: Atherosclerosis of thoracic aorta is noted without aneurysm formation. Normal cardiac size. No pericardial effusion is noted. Mild coronary artery calcifications are noted. Mediastinum/Nodes: No enlarged mediastinal, hilar, or axillary lymph nodes. Endotracheal and nasogastric tubes are in grossly good position. Lungs/Pleura: No pneumothorax is noted. Mild bilateral pleural effusions are noted with adjacent subsegmental atelectasis. 7 mm subpleural nodule is seen in superior segment of left lower lobe best seen on image number 94 series 4. Musculoskeletal: No chest wall mass or suspicious bone lesions identified. CT ABDOMEN PELVIS FINDINGS Hepatobiliary: No focal liver abnormality is seen. No gallstones, gallbladder wall thickening, or biliary dilatation. Probable sludge is noted within the gallbladder lumen. Pancreas: Unremarkable. No pancreatic ductal dilatation or surrounding inflammatory changes. Spleen: Normal in size without focal abnormality. Adrenals/Urinary Tract: Adrenal glands and kidneys appear normal. No hydronephrosis or renal obstruction is noted. No renal or ureteral calculi are noted. Urinary bladder is decompressed secondary to Foley catheter. Stomach/Bowel: Stomach is within normal limits. Appendix appears normal. No evidence of bowel wall thickening, distention, or inflammatory changes. Rectal tube is present. Vascular/Lymphatic: Aortic atherosclerosis. No enlarged abdominal or pelvic lymph nodes. Reproductive: Prostate is unremarkable. Other: Mild anasarca is noted.  No hernia is noted. Musculoskeletal: No acute or significant osseous findings. IMPRESSION: Endotracheal and nasogastric tubes in grossly good position. Mild bilateral pleural effusions are noted with adjacent subsegmental  atelectasis. 7 mm subpleural nodule seen in superior segment of left lower lobe. Non-contrast chest CT at 6-12 months  is recommended. If the nodule is stable at time of repeat CT, then future CT at 18-24 months (from today's scan) is considered optional for low-risk patients, but is recommended for high-risk patients. This recommendation follows the consensus statement: Guidelines for Management of Incidental Pulmonary Nodules Detected on CT Images: From the Fleischner Society 2017; Radiology 2017; 284:228-243. Probable sludge seen within gallbladder. Mild anasarca. Aortic Atherosclerosis (ICD10-I70.0). Electronically Signed   By: Lupita Raider, M.D.   On: 06/15/2018 13:59   Dg Abd 1 View  Result Date: 06/11/2018 CLINICAL DATA:  Vomiting. EXAM: ABDOMEN - 1 VIEW COMPARISON:  None. FINDINGS: A right femoral line is in good position. The distal end of the NG tube is somewhat obscured by overlapping wires but appears to terminate in the distal stomach. No other acute abnormalities. IMPRESSION: Support apparatus as above.  No acute interval change noted. Electronically Signed   By: Gerome Sam III M.D   On: 06/11/2018 12:21   Ct Head Wo Contrast  Result Date: 06/18/2018 CLINICAL DATA:  Focal neuro deficit.  Pupil change. EXAM: CT HEAD WITHOUT CONTRAST TECHNIQUE: Contiguous axial images were obtained from the base of the skull through the vertex without intravenous contrast. COMPARISON:  CT head 06/15/2018 FINDINGS: Brain: Mild atrophy. Negative for acute infarct, hemorrhage, or mass. Vascular: Negative for hyperdense vessel Skull: Negative Sinuses/Orbits: Mild mucosal edema paranasal sinuses.  Normal orbit Other: None IMPRESSION: No acute abnormality.  No change from the recent study. Electronically Signed   By: Marlan Palau M.D.   On: 06/18/2018 21:15   Ct Head Wo Contrast  Result Date: 06/15/2018 CLINICAL DATA:  72 year old male with altered level of consciousness, pneumonia, hematuria EXAM: CT  HEAD WITHOUT CONTRAST TECHNIQUE: Contiguous axial images were obtained from the base of the skull through the vertex without intravenous contrast. COMPARISON:  Recent prior head CT 06/16/2018 FINDINGS: Brain: No evidence of acute infarction, hemorrhage, hydrocephalus, extra-axial collection or mass lesion/mass effect. Stable mild atrophy. Vascular: No hyperdense vessel or unexpected calcification. Bilateral cavernous carotid artery calcifications. Skull: Normal. Negative for fracture or focal lesion. Sinuses/Orbits: Partial bilateral mastoid sinuses are effusions. Incompletely imaged mucous retention cysts versus polyps in the inferior maxillary sinuses. Other: None IMPRESSION: 1. No acute intracranial abnormality. 2. Stable mild cortical cerebral atrophy. 3. Mild bilateral partial mastoid effusions. Electronically Signed   By: Malachy Moan M.D.   On: 06/15/2018 13:24   Ct Head Wo Contrast  Result Date: 06/11/2018 CLINICAL DATA:  Altered level of consciousness. Status post cardiac arrest. EXAM: CT HEAD WITHOUT CONTRAST TECHNIQUE: Contiguous axial images were obtained from the base of the skull through the vertex without intravenous contrast. COMPARISON:  None. FINDINGS: Brain: No acute infarct, hemorrhage, or mass lesion is present. Minimal white matter changes are within normal limits for age. Basal ganglia are intact. Insert pass ventricles No significant extraaxial fluid collection is present. The brainstem and cerebellum are within normal limits. Vascular: Atherosclerotic calcifications are present within the cavernous internal carotid arteries bilaterally. There is no hyperdense vessel. Skull: Calvarium is intact. No focal lytic or blastic lesions are present. Sinuses/Orbits: There is mild mucosal thickening along the floor of the maxillary sinuses bilaterally. The remaining paranasal sinuses and the mastoid air cells are clear. Globes and orbits are within normal limits. IMPRESSION: Normal CT of the  head for age. Electronically Signed   By: Marin Roberts M.D.   On: 06/15/2018 08:12   Ct Chest Wo Contrast  Result Date: 06/15/2018 CLINICAL DATA:  Pneumonia, hematuria. EXAM:  CT CHEST, ABDOMEN AND PELVIS WITHOUT CONTRAST TECHNIQUE: Multidetector CT imaging of the chest, abdomen and pelvis was performed following the standard protocol without IV contrast. COMPARISON:  CT scan of October 16, 2014. FINDINGS: CT CHEST FINDINGS Cardiovascular: Atherosclerosis of thoracic aorta is noted without aneurysm formation. Normal cardiac size. No pericardial effusion is noted. Mild coronary artery calcifications are noted. Mediastinum/Nodes: No enlarged mediastinal, hilar, or axillary lymph nodes. Endotracheal and nasogastric tubes are in grossly good position. Lungs/Pleura: No pneumothorax is noted. Mild bilateral pleural effusions are noted with adjacent subsegmental atelectasis. 7 mm subpleural nodule is seen in superior segment of left lower lobe best seen on image number 94 series 4. Musculoskeletal: No chest wall mass or suspicious bone lesions identified. CT ABDOMEN PELVIS FINDINGS Hepatobiliary: No focal liver abnormality is seen. No gallstones, gallbladder wall thickening, or biliary dilatation. Probable sludge is noted within the gallbladder lumen. Pancreas: Unremarkable. No pancreatic ductal dilatation or surrounding inflammatory changes. Spleen: Normal in size without focal abnormality. Adrenals/Urinary Tract: Adrenal glands and kidneys appear normal. No hydronephrosis or renal obstruction is noted. No renal or ureteral calculi are noted. Urinary bladder is decompressed secondary to Foley catheter. Stomach/Bowel: Stomach is within normal limits. Appendix appears normal. No evidence of bowel wall thickening, distention, or inflammatory changes. Rectal tube is present. Vascular/Lymphatic: Aortic atherosclerosis. No enlarged abdominal or pelvic lymph nodes. Reproductive: Prostate is unremarkable. Other: Mild  anasarca is noted.  No hernia is noted. Musculoskeletal: No acute or significant osseous findings. IMPRESSION: Endotracheal and nasogastric tubes in grossly good position. Mild bilateral pleural effusions are noted with adjacent subsegmental atelectasis. 7 mm subpleural nodule seen in superior segment of left lower lobe. Non-contrast chest CT at 6-12 months is recommended. If the nodule is stable at time of repeat CT, then future CT at 18-24 months (from today's scan) is considered optional for low-risk patients, but is recommended for high-risk patients. This recommendation follows the consensus statement: Guidelines for Management of Incidental Pulmonary Nodules Detected on CT Images: From the Fleischner Society 2017; Radiology 2017; 284:228-243. Probable sludge seen within gallbladder. Mild anasarca. Aortic Atherosclerosis (ICD10-I70.0). Electronically Signed   By: Lupita Raider, M.D.   On: 06/15/2018 13:59   US Renal  Result Date: Jun 14, 2018 CLINICAL DATA:  Acute renal failure. EXAM: RENAL / URINARY TRACT ULTRASOUND COMPLETE COMPARISON:  None. FINDINGS: Right Kidney: Length: 9.7 cm. Echogenicity within normal limits. No mass or hydronephrosis visualized. Benign-appearing cyst in the midpole region of the right kidney measures 1.9 cm in diameter. Left Kidney: Length: 10.1 cm. Echogenicity within normal limits. No mass or hydronephrosis visualized. Bladder: Not visualized. Small amount of bilateral perinephric fluid. Abdominal ascites. Bilateral pleural effusions. IMPRESSION: Normal echogenicity of the kidneys. Benign-appearing right renal cyst. Small amount of bilateral perinephric fluid. Small volume of abdominal ascites. Small bilateral pleural effusions. Electronically Signed   By: Ted Mcalpine M.D.   On: Jun 14, 2018 17:47   Dg Chest Port 1 View  Result Date: 06/25/2018 CLINICAL DATA:  Respiratory distress. EXAM: PORTABLE CHEST 1 VIEW COMPARISON:  06/24/2018 FINDINGS: The tracheostomy tube is  stable. Stable left IJ central venous catheter. Feeding tube is coursing down the esophagus and into the stomach. The heart is normal in size. The mediastinal and hilar contours are normal. Stable underlying emphysematous changes and pulmonary scarring. Persistent streaky left basilar atelectasis and probable small effusion. IMPRESSION: 1. Stable support apparatus. 2. Chronic underlying emphysematous changes. 3. Minimal left basilar atelectasis and small left effusion. Electronically Signed   By: Rudie Meyer  M.D.   On: 06/25/2018 02:51   Dg Chest Port 1 View  Result Date: 06/24/2018 CLINICAL DATA:  72 year old male with shortness of breath. PEA arrest. EXAM: PORTABLE CHEST 1 VIEW COMPARISON:  06/20/2018 and earlier. FINDINGS: Portable AP semi upright view at 0748 hours. Stable tracheostomy and left dual lumen dialysis type catheter. Right IJ single-lumen catheter removed. Stable visible enteric tube, tip not included. Mildly lower lung volumes. Mild increased interstitial markings, and crowding of markings at the left base. No pneumothorax, pleural effusion or consolidation. Normal cardiac size and mediastinal contours. Negative visible bowel gas pattern. IMPRESSION: 1. Right IJ single-lumen catheter removed. Otherwise, stable lines and tubes. 2. Mildly lower lung volumes with mild increased vascular congestion and left base atelectasis. Electronically Signed   By: Odessa Fleming M.D.   On: 06/24/2018 09:38   Dg Chest Port 1 View  Result Date: 06/20/2018 CLINICAL DATA:  Left-sided hemodialysis catheter placement. EXAM: PORTABLE CHEST 1 VIEW COMPARISON:  06/19/2018 FINDINGS: Right IJ central venous catheter and tracheostomy tube unchanged. Enteric tube courses into the region of the stomach and off the inferior portion of the film as tip is not visualized. Interval placement of left IJ dialysis catheter with tip over the SVC. Lungs are adequately inflated with subtle linear bibasilar density likely atelectasis. No  pneumothorax. Cardiomediastinal silhouette and remainder of the exam is unchanged. IMPRESSION: Subtle bibasilar linear atelectasis. Tubes and lines as described. Electronically Signed   By: Elberta Fortis M.D.   On: 06/20/2018 12:47   Dg Chest Port 1 View  Result Date: 06/19/2018 CLINICAL DATA:  Respiratory failure. EXAM: PORTABLE CHEST 1 VIEW COMPARISON:  06/18/2018 FINDINGS: There has been a placement of feeding catheter, descending below the diaphragm, tip collimated off the image. Right internal jugular approach venous catheter sheath and tracheostomy tube are unchanged. Cardiomediastinal silhouette is normal. Mediastinal contours appear intact. There is no evidence of lobar airspace consolidation, pleural effusion or pneumothorax. Minimal peribronchial airspace consolidation versus atelectasis the lung bases. Osseous structures are without acute abnormality. Soft tissues are grossly normal. IMPRESSION: Minimal peribronchial airspace consolidation versus atelectasis in the lung bases. Electronically Signed   By: Ted Mcalpine M.D.   On: 06/19/2018 08:00   Dg Chest Port 1 View  Result Date: 06/18/2018 CLINICAL DATA:  Tracheostomy tube change. EXAM: PORTABLE CHEST 1 VIEW COMPARISON:  Chest x-ray dated June 16, 2018. FINDINGS: New tracheostomy tube in good position with the tip at the thoracic inlet. Unchanged right internal jugular central venous catheter. Interval removal of the enteric tube. The heart size and mediastinal contours are within normal limits. Normal pulmonary vascularity. Improving aeration of the left lower lobe. Unchanged small left pleural effusion. No pneumothorax. No acute osseous abnormality. IMPRESSION: 1. Tracheostomy tube in good position. 2. Slightly improved aeration in the left lower lobe. Unchanged small left pleural effusion. Electronically Signed   By: Obie Dredge M.D.   On: 06/18/2018 15:38   Dg Chest Port 1 View  Result Date: 06/16/2018 CLINICAL DATA:   Respiratory failure EXAM: PORTABLE CHEST 1 VIEW COMPARISON:  06/15/2018 FINDINGS: Endotracheal tube, nasogastric catheter and esophageal probe are again noted and stable. Right jugular temporary dialysis catheter is seen. The cardiac shadow is stable. Slight increased atelectasis is noted in the left base. No pneumothorax is seen. No sizable effusion is noted. IMPRESSION: Slight increase in left basilar atelectasis. Tubes and lines as described. Electronically Signed   By: Alcide Clever M.D.   On: 06/16/2018 08:53   Dg Chest Port 1  View  Result Date: 06/15/2018 CLINICAL DATA:  Respiratory failure.  Intubated patient. EXAM: PORTABLE CHEST 1 VIEW COMPARISON:  06/14/2018 and 06/13/2018. FINDINGS: 0556 hours. Endotracheal tube, enteric tube and right IJ central venous catheter appear unchanged in position. There is a possible mid esophageal temperature probe. The heart size and mediastinal contours are stable. There is stable mild left-greater-than-right basilar atelectasis. No pneumothorax or significant pleural effusion. IMPRESSION: Stable bibasilar atelectasis and support system.  No acute findings. Electronically Signed   By: Carey Bullocks M.D.   On: 06/15/2018 10:50   Dg Chest Port 1 View  Result Date: 06/14/2018 CLINICAL DATA:  Acute respiratory failure. EXAM: PORTABLE CHEST 1 VIEW COMPARISON:  Radiograph of June 13, 2018. FINDINGS: The heart size and mediastinal contours are within normal limits. Stable position of endotracheal and nasogastric tubes. Right internal jugular catheter is unchanged. No pneumothorax is noted. Mild bibasilar subsegmental atelectasis is noted. The visualized skeletal structures are unremarkable. IMPRESSION: Stable support apparatus.  Mild bibasilar subsegmental atelectasis. Electronically Signed   By: Lupita Raider, M.D.   On: 06/14/2018 09:39   Dg Chest Port 1 View  Result Date: 06/13/2018 CLINICAL DATA:  Respiratory failure, intubated patient. History of acute  renal failure and right heart failure. 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 EXAM: PORTABLE CHEST 1 VIEW COMPARISON:  Portable chest x-ray of June 12, 2018 FINDINGS: The lungs are reasonably well inflated. The right hemidiaphragm is better demonstrated today. There is persistent obscuration of the left hemidiaphragm with haziness in the retrocardiac region. The heart is normal in size. The pulmonary vascularity is not engorged. The endotracheal tube tip projects approximately 8.5 cm above the carina with the tip at the superior margin of the clavicular heads. The esophagogastric tubes proximal port is at or just below the GE junction with the tip projecting below the inferior margin of the image. The right internal jugular venous catheter tip projects over the proximal SVC. IMPRESSION: High positioning of the endotracheal tube. Correlation clinically as to the type with tube in adequate C of this positioning is needed. Borderline high positioning of the esophagogastric tube. Advancement by 5 cm would assure that the proximal port remains below the GE junction. Bibasilar atelectasis, stable on the left and improved on the right. Electronically Signed   By: David  [place] M.D.   On: 06/13/2018 08:46   Dg Chest Port 1 View  Result Date: 06/12/2018 CLINICAL DATA:  Intubated patient.  Follow-up exam. EXAM: PORTABLE CHEST 1 VIEW COMPARISON:  06/11/2018 and multiple prior studies. FINDINGS: Endotracheal tube tip projects 6.8 cm above the carinal. Nasal/orogastric tube passes below the diaphragm into the stomach. Right internal jugular central venous line tip projects in the mid to upper superior vena cava. Support apparatus is stable. There is opacity at the right lung base obscuring hemidiaphragm consistent moderate pleural effusion with atelectasis. Milder atelectasis is noted at the left lung base. Allowing for the more erect positioning on the current exam, there has been no  significant interval change. No pneumothorax. IMPRESSION: 1. No significant change from the most recent prior study. 2. Support apparatus is stable. 3. Moderate right pleural effusion. By basilar atelectasis. No convincing pulmonary edema. Electronically Signed   By: Amie Portland M.D.   On: 06/12/2018 10:03   Dg Chest Port 1 View  Result Date: 06/11/2018 CLINICAL DATA:  72 year old male with respiratory failure status post PEA arrest. EXAM: PORTABLE CHEST 1 VIEW COMPARISON:  06/10/2018 and earlier. FINDINGS: Portable AP semi  upright view at 0500 hours. Stable ET tube tip at the level the clavicles. Stable enteric tube, side hole the level of the proximal stomach. Stable right IJ central line. Stable lung volumes. Mediastinal contours remain normal. Veiling opacity in both lungs with no pneumothorax. Upper lobe pulmonary vascularity is normal. Stable ventilation. Paucity bowel gas in the upper abdomen. IMPRESSION: 1.  Stable lines and tubes. 2. Continued stable ventilation with bilateral pleural effusions and lower lobe collapse or consolidation. Electronically Signed   By: Odessa Fleming M.D.   On: 06/11/2018 07:42   Dg Chest Port 1 View  Result Date: 06/10/2018 CLINICAL DATA:  Respiratory failure. EXAM: PORTABLE CHEST 1 VIEW COMPARISON:  Radiograph of June 09, 2018. FINDINGS: The heart size and mediastinal contours are within normal limits. No pneumothorax is noted. Endotracheal and nasogastric tubes are unchanged in position. Right internal jugular catheter is unchanged. Stable bibasilar edema or atelectasis is noted with associated pleural effusions. Bony thorax is unremarkable. The visualized skeletal structures are unremarkable. IMPRESSION: Stable support apparatus. Stable bibasilar opacities as described above. Electronically Signed   By: Lupita Raider, M.D.   On: 06/10/2018 10:36   Dg Chest Port 1 View  Result Date: 06/09/2018 CLINICAL DATA:  Evaluate endotracheal tube, respiratory failure EXAM:  PORTABLE CHEST 1 VIEW COMPARISON:  Portable chest x-ray of 06/08/2018 and 06/07/2018 FINDINGS: The tip of the endotracheal tube appears to be 4.1 cm above the carina. Bibasilar opacities remain most consistent with atelectasis and effusions. Pneumonia cannot be excluded, nor and pulmonary vascular congestion. The heart is only mildly enlarged. Right central venous line tip overlies the upper SVC and no pneumothorax is seen. NG tube extends into the stomach. IMPRESSION: 1. Increasing opacification at the lung bases consistent with atelectasis, bilateral effusions, and possibly pneumonia. Pulmonary vascular congestion cannot be excluded. 2. Right central venous line tip overlies the upper SVC. 3. Endotracheal tube tip is approximately 4.1 cm above the carina. Electronically Signed   By: Dwyane Dee M.D.   On: 06/09/2018 10:36   Dg Chest Port 1 View  Result Date: 06/08/2018 CLINICAL DATA:  Status post central line placement today. EXAM: PORTABLE CHEST 1 VIEW COMPARISON:  Single-view of the chest earlier today. FINDINGS: New double lumen central venous catheter from right IJ approach is in place with its tip in the mid to lower superior vena cava. Endotracheal tube and NG tube are unchanged. Defibrillator pad remains in place. Bilateral airspace disease and effusions are unchanged. No pneumothorax. IMPRESSION: Tip of new right IJ catheter projects in the mid to lower superior vena cava. No pneumothorax. No other change. Electronically Signed   By: Drusilla Kanner M.D.   On: 06/08/2018 11:12   Dg Chest Port 1 View  Result Date: 06/08/2018 CLINICAL DATA:  Intubation. EXAM: PORTABLE CHEST 1 VIEW COMPARISON:  06/07/2018. FINDINGS: Endotracheal tube and NG tube in stable position. Heart size normal. Diffuse bilateral pulmonary infiltrates/edema and bilateral pleural effusions. IMPRESSION: 1.  Lines and tubes stable position. 2. Diffuse bilateral pulmonary infiltrates/edema and pleural effusions. Findings have  progressed from prior exam. Electronically Signed   By: Maisie Fus  Register   On: 06/08/2018 10:55   Dg Chest Port 1 View  Result Date: 06/07/2018 CLINICAL DATA:  Hypoxia EXAM: PORTABLE CHEST 1 VIEW COMPARISON:  June 05, 2018 FINDINGS: Endotracheal tube tip is 2.6 cm above the carina. Nasogastric tube tip and side port are below the diaphragm. No pneumothorax. There is a moderate left pleural effusion with consolidation in the left  base. There is mild atelectatic change in the right base. Heart size and pulmonary vascularity are within normal limits. No adenopathy. No bone lesions. IMPRESSION: Tube positions as described without pneumothorax. Moderate left pleural effusion with left base consolidation. Right base atelectasis. Stable cardiac silhouette. Electronically Signed   By: Bretta Bang III M.D.   On: 06/07/2018 10:00   Dg Chest Port 1 View  Result Date: 05/28/2018 CLINICAL DATA:  Intubation after CPR EXAM: PORTABLE CHEST 1 VIEW COMPARISON:  Chest radiograph 10/20/2014 FINDINGS: Endotracheal tube tip is just below the clavicular heads approximately 2 cm above the inferior margin of the carina. The enteric tube tip and side port are below the field of view. The lungs are clear. Normal cardiomediastinum. IMPRESSION: 1. Endotracheal tube tip 2 cm above the inferior carina. 2. Clear lungs. Electronically Signed   By: Deatra Robinson M.D.   On: 06/08/2018 05:38   Dg Abd Portable 1v  Result Date: 06/06/2018 CLINICAL DATA:  72 year old male status post gastric tube placement EXAM: PORTABLE ABDOMEN - 1 VIEW COMPARISON:  Chest x-ray, yesterday 06/02/2018 FINDINGS: A gastric tube is present. The tip lies over the gastric body in good position. External defibrillator pads project over the left chest. A right femoral approach central venous catheter is present. The tip projects over the right sacral ala, presumably within the right common iliac vein. Mild linear atelectasis in the lung bases. No evidence  of free air or bowel obstruction. Lower lumbar degenerative disc disease at L4-L5 and L5-S1. IMPRESSION: Well-positioned gastric tube with the tip overlying the gastric body. Right femoral approach central venous catheter. The tip overlies the expected location of the right common iliac vein. Electronically Signed   By: Malachy Moan M.D.   On: 06/06/2018 13:13   Vas [place] Lower Extremity Venous (dvt)  Result Date: 06/10/2018  Lower Venous Study Indications: Edema.  Limitations: Body habitus, bandages, line and critical condition, edema, coma. Comparison Study: Previous Lower extremties venous duplex exam on 05/31/2018 Performing Technologist: Hongying Cole  Examination Guidelines: A complete evaluation includes B-mode imaging, spectral Doppler, color Doppler, and power Doppler as needed of all accessible portions of each vessel. Bilateral testing is considered an integral part of a complete examination. Limited examinations for reoccurring indications may be performed as noted.  Right Venous Findings: +---------+---------------+---------+-----------+----------+-------------------+          CompressibilityPhasicitySpontaneityPropertiesSummary             +---------+---------------+---------+-----------+----------+-------------------+ CFV                              Yes                  unable to access                                                          due to multiple                                                           tubes and lines and  patches on site     +---------+---------------+---------+-----------+----------+-------------------+ SFJ                              Yes                  poor visibility     +---------+---------------+---------+-----------+----------+-------------------+ FV Prox                 Yes      Yes                                       +---------+---------------+---------+-----------+----------+-------------------+ FV Mid                  Yes      Yes                  poor visibility     +---------+---------------+---------+-----------+----------+-------------------+ FV Distal               Yes      Yes                  poor visibility     +---------+---------------+---------+-----------+----------+-------------------+ PFV                                                   poor visibility     +---------+---------------+---------+-----------+----------+-------------------+ POP      Full           Yes      Yes                                      +---------+---------------+---------+-----------+----------+-------------------+ PTV                                                   poor visibility     +---------+---------------+---------+-----------+----------+-------------------+ PERO                                                  poor visibility     +---------+---------------+---------+-----------+----------+-------------------+ complex fluid collection seen at popliteal fossa with measurement 2.78x2.36x3.43cm medially.  Left Venous Findings: +---------+---------------+---------+-----------+----------+-------------------+          CompressibilityPhasicitySpontaneityPropertiesSummary             +---------+---------------+---------+-----------+----------+-------------------+ CFV                     Yes      Yes                  unable to access  due to multiple                                                           tubes and lines and                                                       patches on site     +---------+---------------+---------+-----------+----------+-------------------+ SFJ                                                   unable to access                                                           due to multiple                                                           tubes and lines and                                                       patches on site     +---------+---------------+---------+-----------+----------+-------------------+ FV Prox                 Yes      Yes                  not adequate                                                              visibility          +---------+---------------+---------+-----------+----------+-------------------+ FV Mid   Full           Yes      Yes                                      +---------+---------------+---------+-----------+----------+-------------------+ FV Distal               No       Yes                  poor visibility     +---------+---------------+---------+-----------+----------+-------------------+ PFV  not visualized      +---------+---------------+---------+-----------+----------+-------------------+ POP      None                                         Acute               +---------+---------------+---------+-----------+----------+-------------------+ PTV                                                   poor visibility     +---------+---------------+---------+-----------+----------+-------------------+ PERO                                                  poor visibility     +---------+---------------+---------+-----------+----------+-------------------+    Summary: Right: Limited study due to very poor visibility and access due to patient condition, cannot completely exclude the possible thrombosis existence. Left: Findings consistent with acute deep vein thrombosis involving the left popliteal vein.  *See table(s) above for measurements and observations. Electronically signed by Sherald Hess MD on 06/10/2018 at 6:02:54 PM.    Final    Vas [place] Lower Extremity Venous (dvt)  Result Date: 05/29/2018   Lower Venous Study Risk Factors: Suspected PE DVT History of DVT Right ventricular enlargement and dysfunction. Limitations: Line, bandages and positioning, ventilation. Comparison Study: Prior positive lower extremity venous duplex from 10/17/14, is                   available for comparison. Performing Technologist: Sherren Kerns RVS  Examination Guidelines: A complete evaluation includes B-mode imaging, spectral Doppler, color Doppler, and power Doppler as needed of all accessible portions of each vessel. Bilateral testing is considered an integral part of a complete examination. Limited examinations for reoccurring indications may be performed as noted.  Right Venous Findings: +---------+---------------+---------+-----------+----------+-------------------+          CompressibilityPhasicitySpontaneityPropertiesSummary             +---------+---------------+---------+-----------+----------+-------------------+ CFV      Full           Yes      Yes                                      +---------+---------------+---------+-----------+----------+-------------------+ SFJ      Full                                                             +---------+---------------+---------+-----------+----------+-------------------+ FV Prox  Full                                                             +---------+---------------+---------+-----------+----------+-------------------+ FV Mid   Full                                                             +---------+---------------+---------+-----------+----------+-------------------+  FV DistalFull                                                             +---------+---------------+---------+-----------+----------+-------------------+ PFV      Full                                                             +---------+---------------+---------+-----------+----------+-------------------+ POP                                                    unable to                                                                 adequately                                                                visualize           +---------+---------------+---------+-----------+----------+-------------------+ PTV      Full                                                             +---------+---------------+---------+-----------+----------+-------------------+ PERO     Full                                                             +---------+---------------+---------+-----------+----------+-------------------+ GSV      Full                                                             +---------+---------------+---------+-----------+----------+-------------------+  Left Venous Findings: +---------+---------------+---------+-----------+----------+-------------------+          CompressibilityPhasicitySpontaneityPropertiesSummary             +---------+---------------+---------+-----------+----------+-------------------+ CFV  line and bandages                                                         in groin            +---------+---------------+---------+-----------+----------+-------------------+ FV Prox  Full           Yes      Yes                                      +---------+---------------+---------+-----------+----------+-------------------+ FV Mid   Full                                                             +---------+---------------+---------+-----------+----------+-------------------+ FV DistalFull                                                             +---------+---------------+---------+-----------+----------+-------------------+ POP      Partial                                      Acute               +---------+---------------+---------+-----------+----------+-------------------+  Left Technical Findings:  Not visualized segments include common femoral, SFJ, profunda, posterior tibial, and peroneal veins.   *See table(s) above for measurements and observations. Electronically signed by Coral Else MD on 06/13/2018 at 8:06:48 PM.    Final     Microbiology Recent Results (from the past 240 hour(s))  Culture, respiratory (non-expectorated)     Status: None   Collection Time: 06/22/18  3:39 PM  Result Value Ref Range Status   Specimen Description TRACHEAL ASPIRATE  Final   Special Requests NONE  Final   Gram Stain   Final    NO WBC SEEN RARE SQUAMOUS EPITHELIAL CELLS PRESENT NO ORGANISMS SEEN Performed at [facility], [address] [place], [place] [address]    Culture FEW CANDIDA ALBICANS  Final   Report Status 06/25/2018 FINAL  Final  Culture, blood (Routine X 2) w Reflex to ID Panel     Status: None   Collection Time: 06/24/18 10:29 AM  Result Value Ref Range Status   Specimen Description BLOOD LEFT ANTECUBITAL  Final   Special Requests   Final    BOTTLES DRAWN AEROBIC ONLY Blood Culture adequate volume   Culture   Final    NO GROWTH 5 DAYS Performed at [facility], [address] [place], [place] [address]    Report Status 07-05-2018 FINAL  Final  Culture, blood (Routine X 2) w Reflex to ID Panel     Status: None   Collection Time: 06/24/18 10:34 AM  Result Value Ref Range Status   Specimen Description BLOOD LEFT HAND  Final   Special Requests  Final    BOTTLES DRAWN AEROBIC ONLY Blood Culture adequate volume   Culture   Final    NO GROWTH 5 DAYS Performed at [facility], [address], [place], [place] [address]    Report Status 07-29-2018 FINAL  Final  Culture, respiratory (non-expectorated)     Status: None   Collection Time: 06/24/18  6:12 PM  Result Value Ref Range Status   Specimen Description TRACHEAL ASPIRATE  Final   Special Requests NONE  Final   Gram Stain   Final    FEW WBC PRESENT, PREDOMINANTLY PMN NO ORGANISMS SEEN Performed at  [facility], [address], [place], [place] [address]    Culture FEW CANDIDA ALBICANS  Final   Report Status 06/27/2018 FINAL  Final  C difficile quick scan w PCR reflex     Status: None   Collection Time: 06/25/18 12:52 PM  Result Value Ref Range Status   C Diff antigen NEGATIVE NEGATIVE Final   C Diff toxin NEGATIVE NEGATIVE Final   C Diff interpretation No C. difficile detected.  Final    Comment: Performed at [facility], [address] [place], [place] [address]    Lab Basic Metabolic Panel: Recent Labs  Lab 06/24/18 0243  06/25/18 0400  06/26/18 0446 06/26/18 1551 06/27/18 0533 06/27/18 1645 06/28/18 0220  NA 135   < >  --    < > 137 136 136 136 135  K 4.9   < >  --    < > 5.0 5.1 4.8 4.3 4.5  CL 100   < >  --    < > 101 102 101 101 98  CO2 19*   < >  --    < > 22 22 23 26 27   GLUCOSE 129*   < >  --    < > 116* 146* 112* 108* 120*  BUN 66*   < >  --    < > 58* 46* 37* 37* 32*  CREATININE 4.84*   < >  --    < > 3.79* 2.90* 2.47* 2.55* 2.28*  CALCIUM 8.4*   < >  --    < > 8.6* 8.3* 8.4* 8.3* 8.7*  MG 2.5*  --  2.8*  --  2.6*  --  2.7*  --  2.7*  PHOS 3.3   < >  --    < > 3.2 3.2 2.5 2.7 3.2   < > = values in this interval not displayed.   Liver Function Tests: Recent Labs  Lab 06/26/18 0446 06/26/18 1551 06/27/18 0533 06/27/18 1645 06/28/18 0220  ALBUMIN 1.7* 1.6* 1.8* 1.8* 2.0*   No results for input(s): LIPASE, AMYLASE in the last 168 hours. No results for input(s): AMMONIA in the last 168 hours. CBC: Recent Labs  Lab 06/24/18 0243 06/25/18 0400 06/26/18 0446 06/28/18 0220  WBC 21.2* 24.0* 24.9* 22.8*  HGB 8.0* 7.9* 8.0* 7.0*  HCT 25.3* 24.3* 25.8* 23.5*  MCV 91.3 94.2 94.5 99.2  PLT 539* 575* 679* 742*   Cardiac Enzymes: No results for input(s): CKTOTAL, CKMB, CKMBINDEX, TROPONINI in the last 168 hours. Sepsis Labs: Recent Labs  Lab 06/24/18 0243 06/25/18 0400 06/26/18 0446 06/28/18 0220  WBC 21.2* 24.0* 24.9* 22.8*     Procedures/Operations  ETT 10/20 >>11/2 Trach (JY) 11/2>>> Right Femoral CVC 10/20 >> 10/29 Left Femoral Aline 10/20 >> 10/30 R IJ HD cath 10/23 > 11/4 Rt radial a line 10/30 >>  out L IJ HD cath 11/4 >>  CT Head 10/20 >> neg Echo 10/20 > EF 55 - 60%, severely dilated RV, mod dilated RA, mod TR. CT chest/abd/ pelvis 10/30  >> no bleed CT head 10/30 neg   Bradley L Icard 06/30/2018, 11:57 AM

## 2018-07-17 DEATH — deceased

## 2018-12-27 IMAGING — DX DG CHEST 1V PORT
1 series · 1 of 1 positions shown · non-contrast
Comparison: Portable chest x-ray June 12, 2018

CLINICAL DATA: Respiratory failure, intubated patient. History of
acute renal failure and right heart failure. Chronic septic
pulmonary embolism with cor pulmonale, cardiac arrest.

EXAM:
PORTABLE CHEST 1 VIEW

[chest]
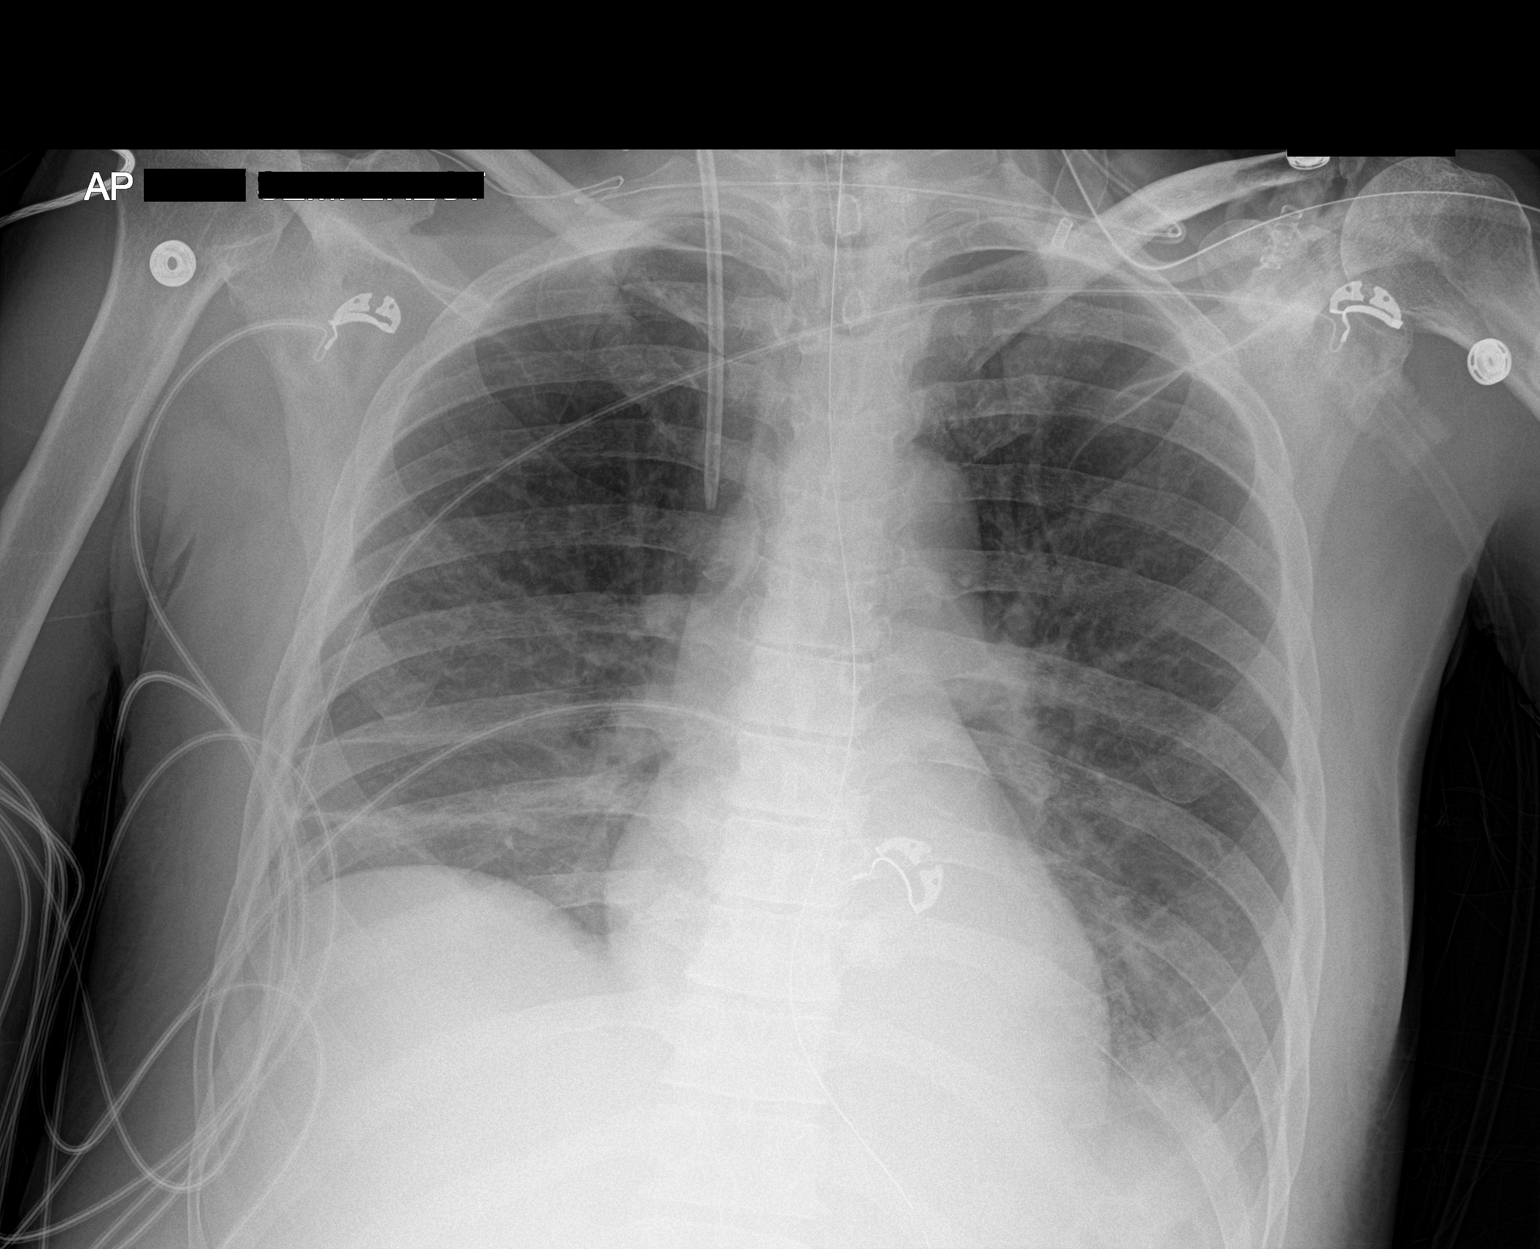

[1 of 1 positions shown; findings below may reference images not displayed]

FINDINGS: The lungs are reasonably well inflated. The right hemidiaphragm is
better demonstrated today. There is persistent obscuration of the
left hemidiaphragm with haziness in the retrocardiac region. The
heart is normal in size. The pulmonary vascularity is not engorged.
The endotracheal tube tip projects approximately 8.5 cm above the
carina with the tip at the superior margin of the clavicular heads.
The esophagogastric tubes proximal port is at or just below the GE
junction with the tip projecting below the inferior margin of the
image. The right internal jugular venous catheter tip projects over
the proximal SVC.
IMPRESSION: High positioning of the endotracheal tube. Correlation clinically as
to the type with tube in adequate C of this positioning is needed.

Borderline high positioning of the esophagogastric tube. Advancement
by 5 cm would assure that the proximal port remains below the GE
junction.

Bibasilar atelectasis, stable on the left and improved on the right.

## 2018-12-30 IMAGING — DX DG CHEST 1V PORT
1 series · 1 of 1 positions shown · non-contrast
Comparison: 06/15/2018

CLINICAL DATA: Respiratory failure

EXAM:
PORTABLE CHEST 1 VIEW

[chest ap]
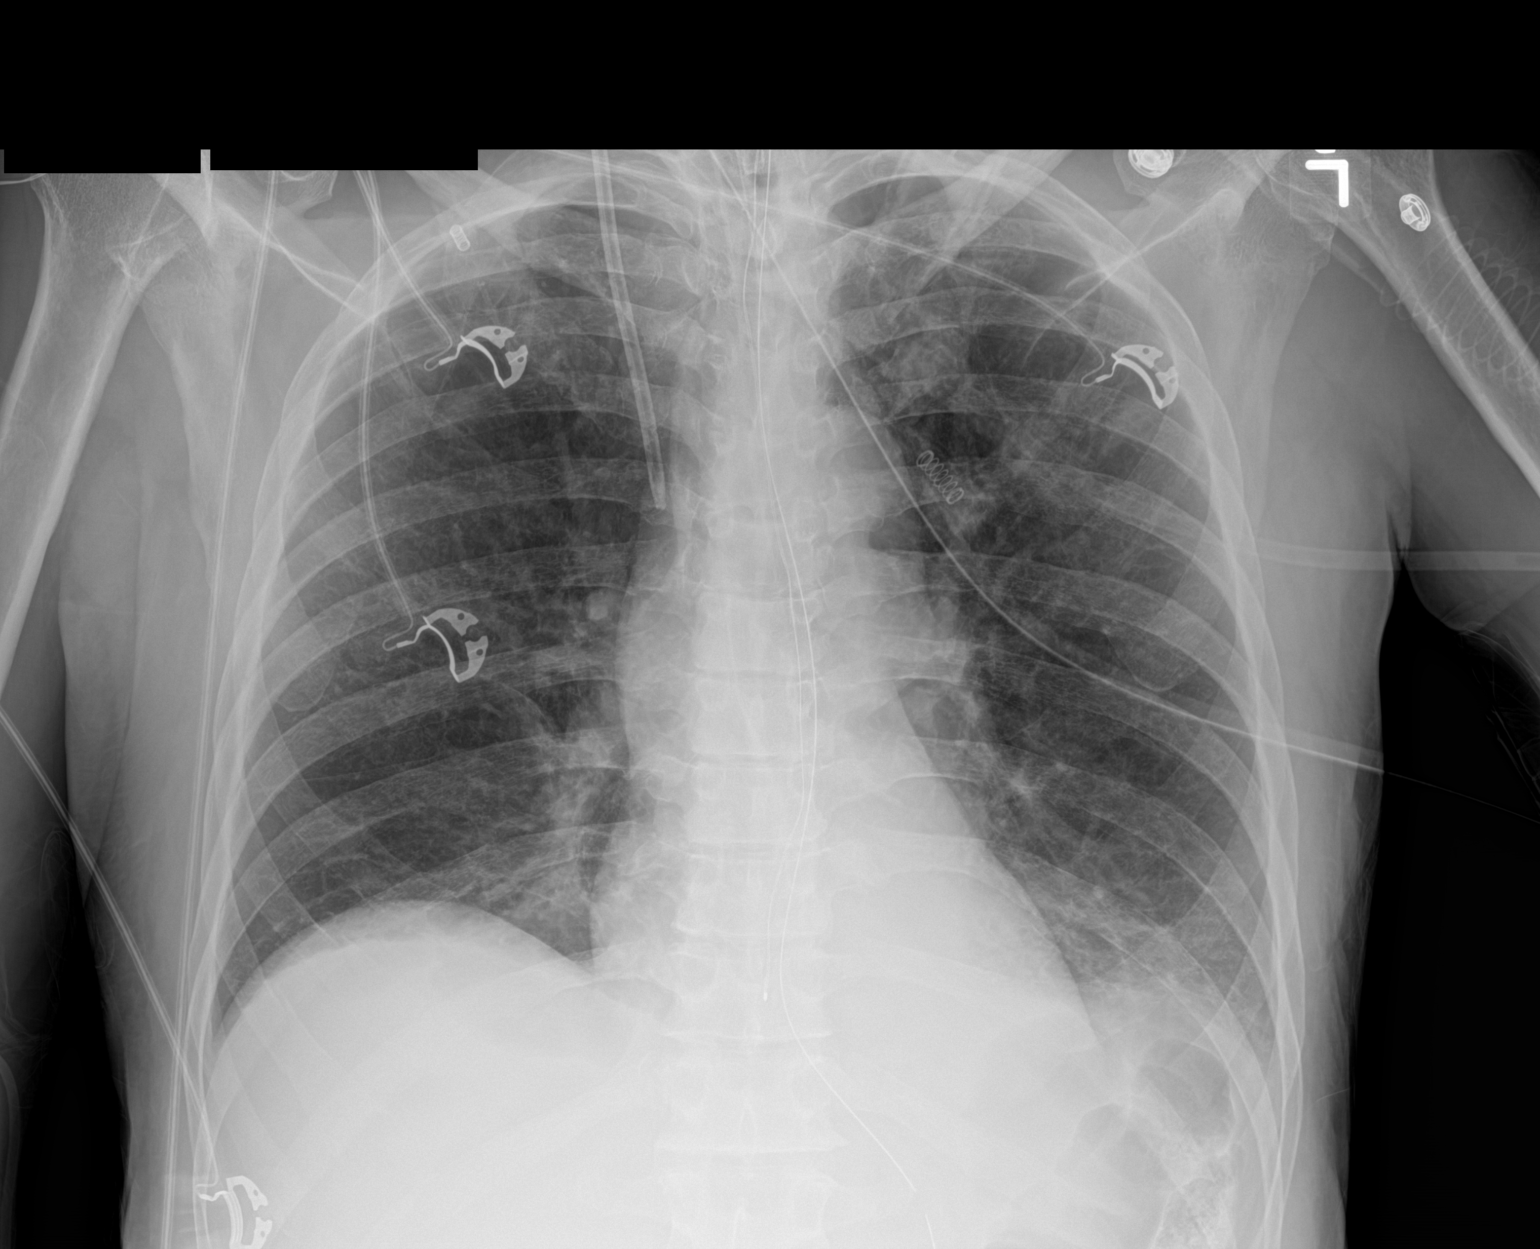

[1 of 1 positions shown; findings below may reference images not displayed]

FINDINGS: Endotracheal tube, nasogastric catheter and esophageal probe are
again noted and stable. Right jugular temporary dialysis catheter is
seen. The cardiac shadow is stable. Slight increased atelectasis is
noted in the left base. No pneumothorax is seen. No sizable effusion
is noted.
IMPRESSION: Slight increase in left basilar atelectasis.

Tubes and lines as described.

## 2019-01-02 IMAGING — DX DG CHEST 1V PORT
1 series · 1 of 1 positions shown · non-contrast
Comparison: 06/18/2018

CLINICAL DATA: Respiratory failure.

EXAM:
PORTABLE CHEST 1 VIEW

[chest]
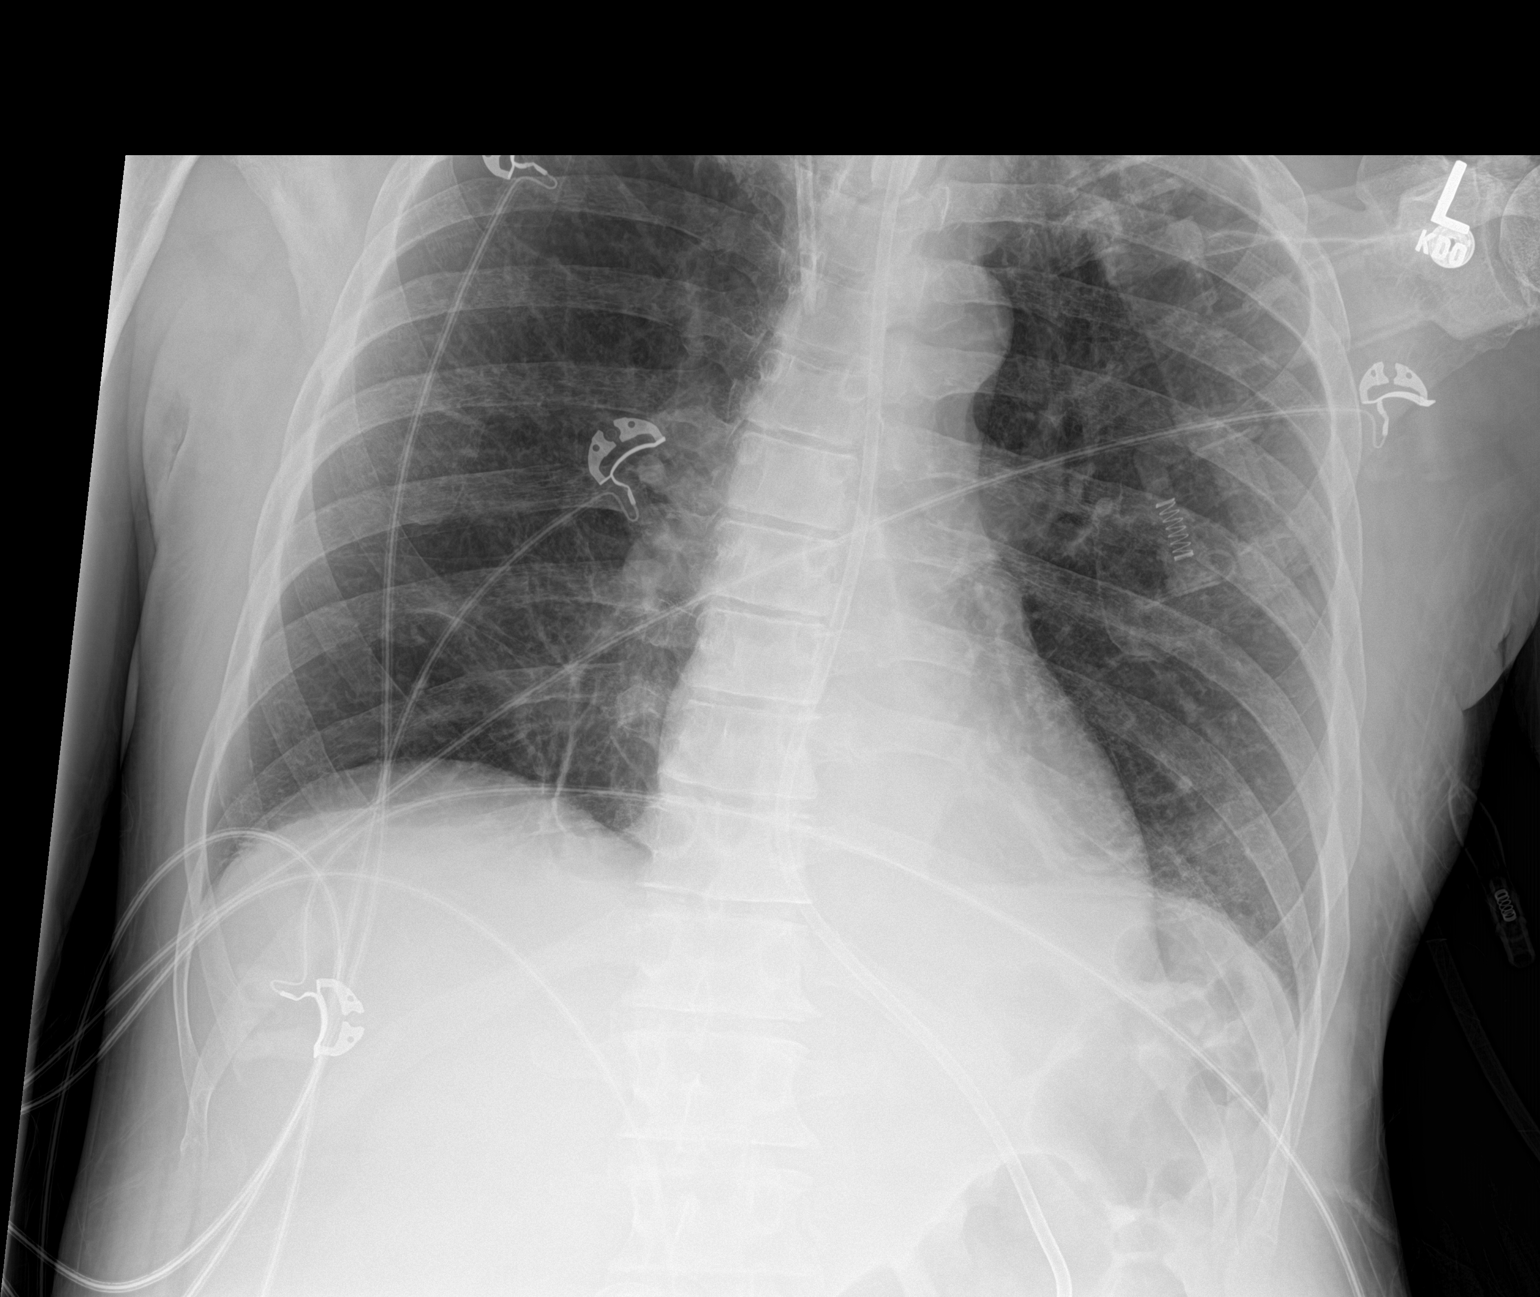

[1 of 1 positions shown; findings below may reference images not displayed]

FINDINGS: There has been a placement of feeding catheter, descending below the
diaphragm, tip collimated off the image. Right internal jugular
approach venous catheter sheath and tracheostomy tube are unchanged.

Cardiomediastinal silhouette is normal. Mediastinal contours appear
intact.

There is no evidence of lobar airspace consolidation, pleural
effusion or pneumothorax. Minimal peribronchial airspace
consolidation versus atelectasis the lung bases.

Osseous structures are without acute abnormality. Soft tissues are
grossly normal.
IMPRESSION: Minimal peribronchial airspace consolidation versus atelectasis in
the lung bases.
